# Patient Record
Sex: Male | Born: 1955
Health system: Southern US, Community
[De-identification: ages and names within clinical notes are randomized; demographics above are authoritative.]

## PROBLEM LIST (undated history)

## (undated) DIAGNOSIS — M5126 Other intervertebral disc displacement, lumbar region: Secondary | ICD-10-CM

## (undated) DIAGNOSIS — G4730 Sleep apnea, unspecified: Secondary | ICD-10-CM

## (undated) DIAGNOSIS — M1990 Unspecified osteoarthritis, unspecified site: Secondary | ICD-10-CM

## (undated) DIAGNOSIS — I10 Essential (primary) hypertension: Secondary | ICD-10-CM

## (undated) DIAGNOSIS — N201 Calculus of ureter: Secondary | ICD-10-CM

## (undated) DIAGNOSIS — N200 Calculus of kidney: Secondary | ICD-10-CM

## (undated) DIAGNOSIS — E663 Overweight: Secondary | ICD-10-CM

## (undated) DIAGNOSIS — L570 Actinic keratosis: Secondary | ICD-10-CM

## (undated) DIAGNOSIS — I4819 Other persistent atrial fibrillation: Secondary | ICD-10-CM

## (undated) DIAGNOSIS — C439 Malignant melanoma of skin, unspecified: Secondary | ICD-10-CM

## (undated) DIAGNOSIS — R0683 Snoring: Secondary | ICD-10-CM

## (undated) DIAGNOSIS — G43909 Migraine, unspecified, not intractable, without status migrainosus: Secondary | ICD-10-CM

## (undated) DIAGNOSIS — K922 Gastrointestinal hemorrhage, unspecified: Secondary | ICD-10-CM

## (undated) DIAGNOSIS — K219 Gastro-esophageal reflux disease without esophagitis: Secondary | ICD-10-CM

## (undated) DIAGNOSIS — J309 Allergic rhinitis, unspecified: Secondary | ICD-10-CM

## (undated) DIAGNOSIS — R030 Elevated blood-pressure reading, without diagnosis of hypertension: Secondary | ICD-10-CM

## (undated) DIAGNOSIS — K222 Esophageal obstruction: Secondary | ICD-10-CM

## (undated) HISTORY — DX: Other intervertebral disc displacement, lumbar region: M51.26

## (undated) HISTORY — DX: Calculus of ureter: N20.1

## (undated) HISTORY — DX: Unspecified osteoarthritis, unspecified site: M19.90

## (undated) HISTORY — DX: Sleep apnea, unspecified: G47.30

## (undated) HISTORY — PX: WISDOM TOOTH EXTRACTION: SHX21

## (undated) HISTORY — PX: COLONOSCOPY, SCREENING: SHX00150

## (undated) HISTORY — DX: Gastro-esophageal reflux disease without esophagitis: K21.9

## (undated) HISTORY — PX: COLONOSCOPY: SHX174

## (undated) HISTORY — DX: Other persistent atrial fibrillation: I48.19

## (undated) HISTORY — DX: Overweight: E66.3

## (undated) HISTORY — DX: Snoring: R06.83

## (undated) HISTORY — DX: Elevated blood-pressure reading, without diagnosis of hypertension: R03.0

## (undated) HISTORY — PX: PROSTATE BIOPSY: SHX241

## (undated) HISTORY — DX: Actinic keratosis: L57.0

## (undated) HISTORY — DX: Malignant melanoma of skin, unspecified: C43.9

## (undated) HISTORY — DX: Esophageal obstruction: K22.2

## (undated) HISTORY — PX: UPPER GI ENDOSCOPY: SHX6162

## (undated) HISTORY — DX: Allergic rhinitis, unspecified: J30.9

## (undated) HISTORY — DX: Migraine, unspecified, not intractable, without status migrainosus: G43.909

## (undated) HISTORY — DX: Gastrointestinal hemorrhage, unspecified: K92.2

## (undated) HISTORY — PX: OTHER SURGICAL HISTORY: SHX169

## (undated) SURGERY — CARDIOVERSION
Anesthesia: Monitor Anesthesia Care

---

## 2000-01-22 ENCOUNTER — Encounter: Payer: Self-pay | Admitting: Emergency Medicine

## 2000-01-22 ENCOUNTER — Inpatient Hospital Stay (HOSPITAL_COMMUNITY): Admission: EM | Admit: 2000-01-22 | Discharge: 2000-01-23 | Payer: Self-pay | Admitting: Emergency Medicine

## 2000-01-23 ENCOUNTER — Encounter: Payer: Self-pay | Admitting: Cardiovascular Disease

## 2004-07-07 ENCOUNTER — Ambulatory Visit: Payer: Self-pay | Admitting: Internal Medicine

## 2005-02-01 ENCOUNTER — Ambulatory Visit: Payer: Self-pay | Admitting: Internal Medicine

## 2005-02-15 ENCOUNTER — Ambulatory Visit: Payer: Self-pay | Admitting: Internal Medicine

## 2005-08-12 ENCOUNTER — Emergency Department (HOSPITAL_COMMUNITY): Admission: EM | Admit: 2005-08-12 | Discharge: 2005-08-12 | Payer: Self-pay | Admitting: Emergency Medicine

## 2006-04-17 ENCOUNTER — Ambulatory Visit: Payer: Self-pay | Admitting: Internal Medicine

## 2006-07-01 ENCOUNTER — Ambulatory Visit: Payer: Self-pay | Admitting: Internal Medicine

## 2006-11-20 ENCOUNTER — Ambulatory Visit: Payer: Self-pay | Admitting: Internal Medicine

## 2007-05-11 DIAGNOSIS — J309 Allergic rhinitis, unspecified: Secondary | ICD-10-CM | POA: Insufficient documentation

## 2007-05-11 DIAGNOSIS — K219 Gastro-esophageal reflux disease without esophagitis: Secondary | ICD-10-CM | POA: Insufficient documentation

## 2007-05-11 DIAGNOSIS — G43909 Migraine, unspecified, not intractable, without status migrainosus: Secondary | ICD-10-CM | POA: Insufficient documentation

## 2007-05-11 HISTORY — DX: Allergic rhinitis, unspecified: J30.9

## 2007-05-11 HISTORY — DX: Gastro-esophageal reflux disease without esophagitis: K21.9

## 2007-05-11 HISTORY — DX: Migraine, unspecified, not intractable, without status migrainosus: G43.909

## 2007-07-11 ENCOUNTER — Ambulatory Visit: Payer: Self-pay | Admitting: Internal Medicine

## 2007-11-12 ENCOUNTER — Ambulatory Visit: Payer: Self-pay | Admitting: Internal Medicine

## 2007-11-12 DIAGNOSIS — R030 Elevated blood-pressure reading, without diagnosis of hypertension: Secondary | ICD-10-CM | POA: Insufficient documentation

## 2007-11-12 HISTORY — DX: Elevated blood-pressure reading, without diagnosis of hypertension: R03.0

## 2007-11-12 LAB — HM COLONOSCOPY

## 2008-03-18 ENCOUNTER — Telehealth: Payer: Self-pay | Admitting: Internal Medicine

## 2008-05-21 ENCOUNTER — Ambulatory Visit: Payer: Self-pay | Admitting: Internal Medicine

## 2008-07-08 ENCOUNTER — Telehealth: Payer: Self-pay | Admitting: Internal Medicine

## 2008-07-26 ENCOUNTER — Ambulatory Visit: Payer: Self-pay | Admitting: Internal Medicine

## 2008-08-04 ENCOUNTER — Emergency Department (HOSPITAL_COMMUNITY): Admission: EM | Admit: 2008-08-04 | Discharge: 2008-08-04 | Payer: Self-pay | Admitting: Emergency Medicine

## 2008-08-04 IMAGING — CR DG ANKLE PORT 2V*L*
2 series · 2 of 2 positions shown · non-contrast
Comparison: [8U] hours the same day.

CLINICAL DATA: 52-year-old male with ankle injury post reduction.

PORTABLE LEFT ANKLE - 2 VIEW

[view not recorded (1 of 2)]
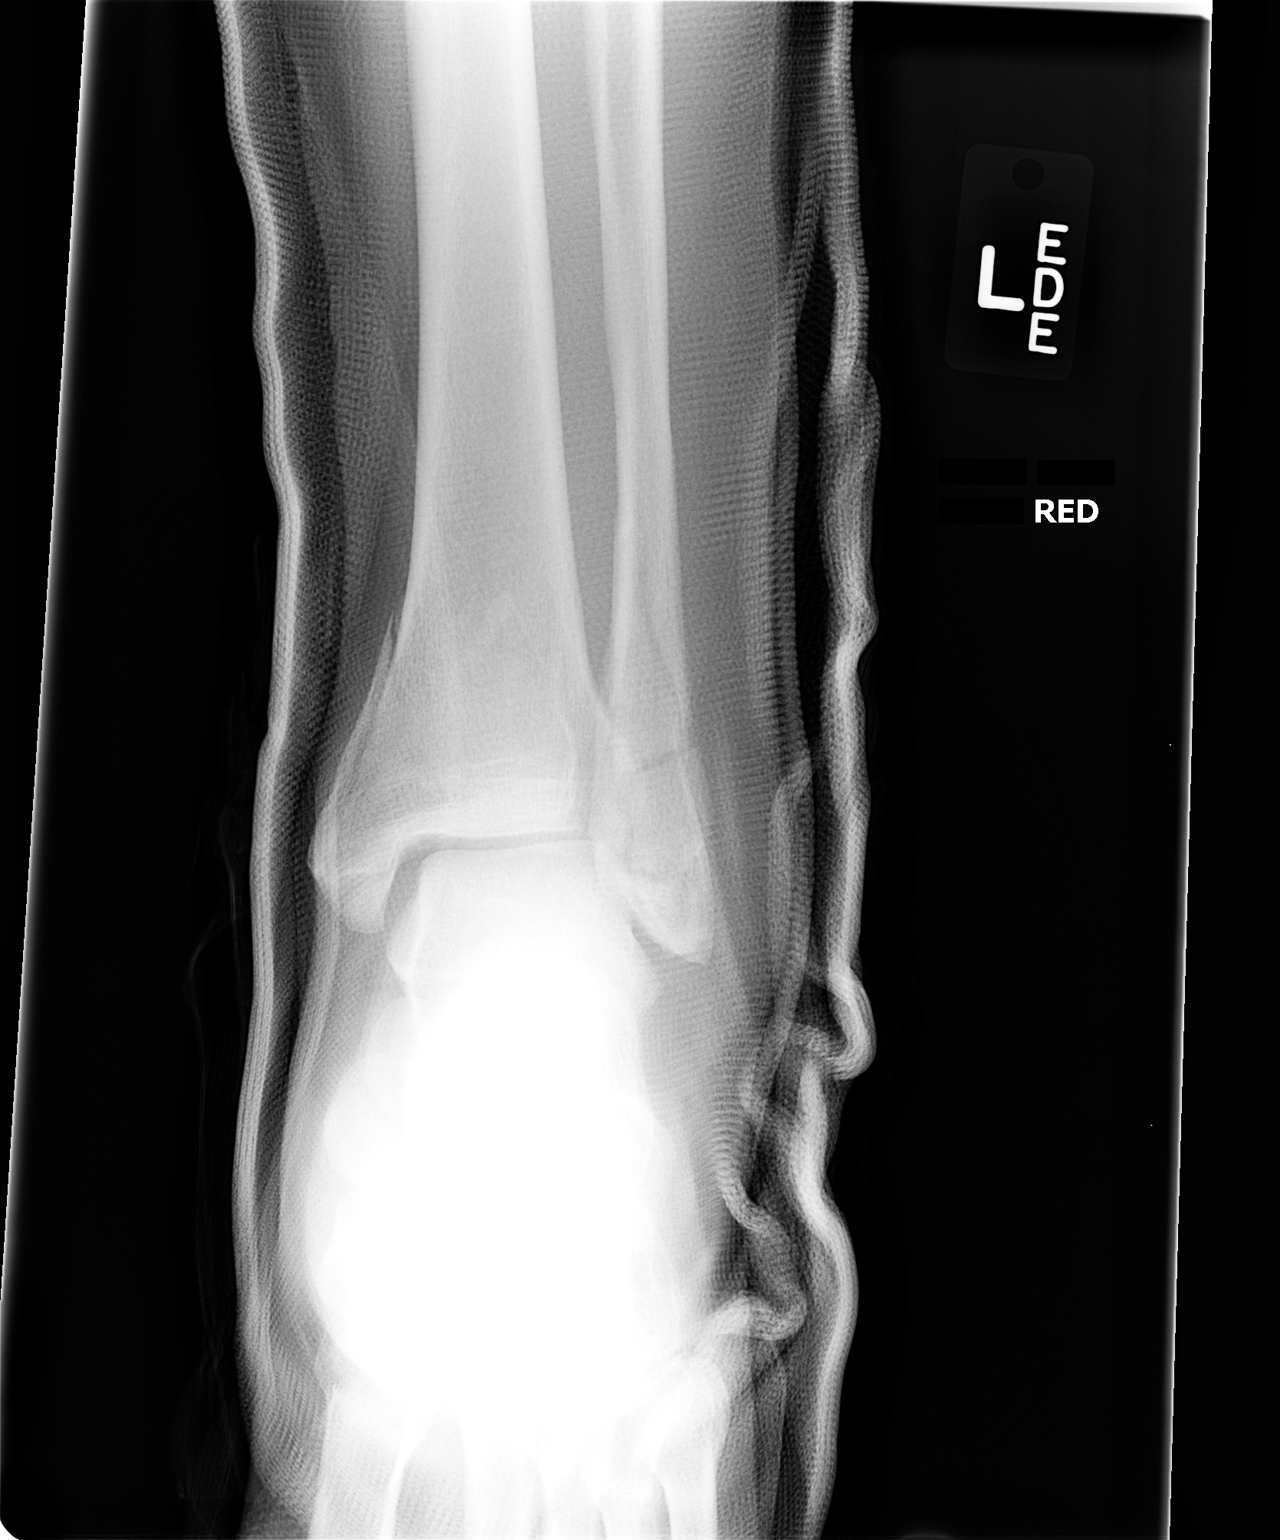

[view not recorded (2 of 2)]
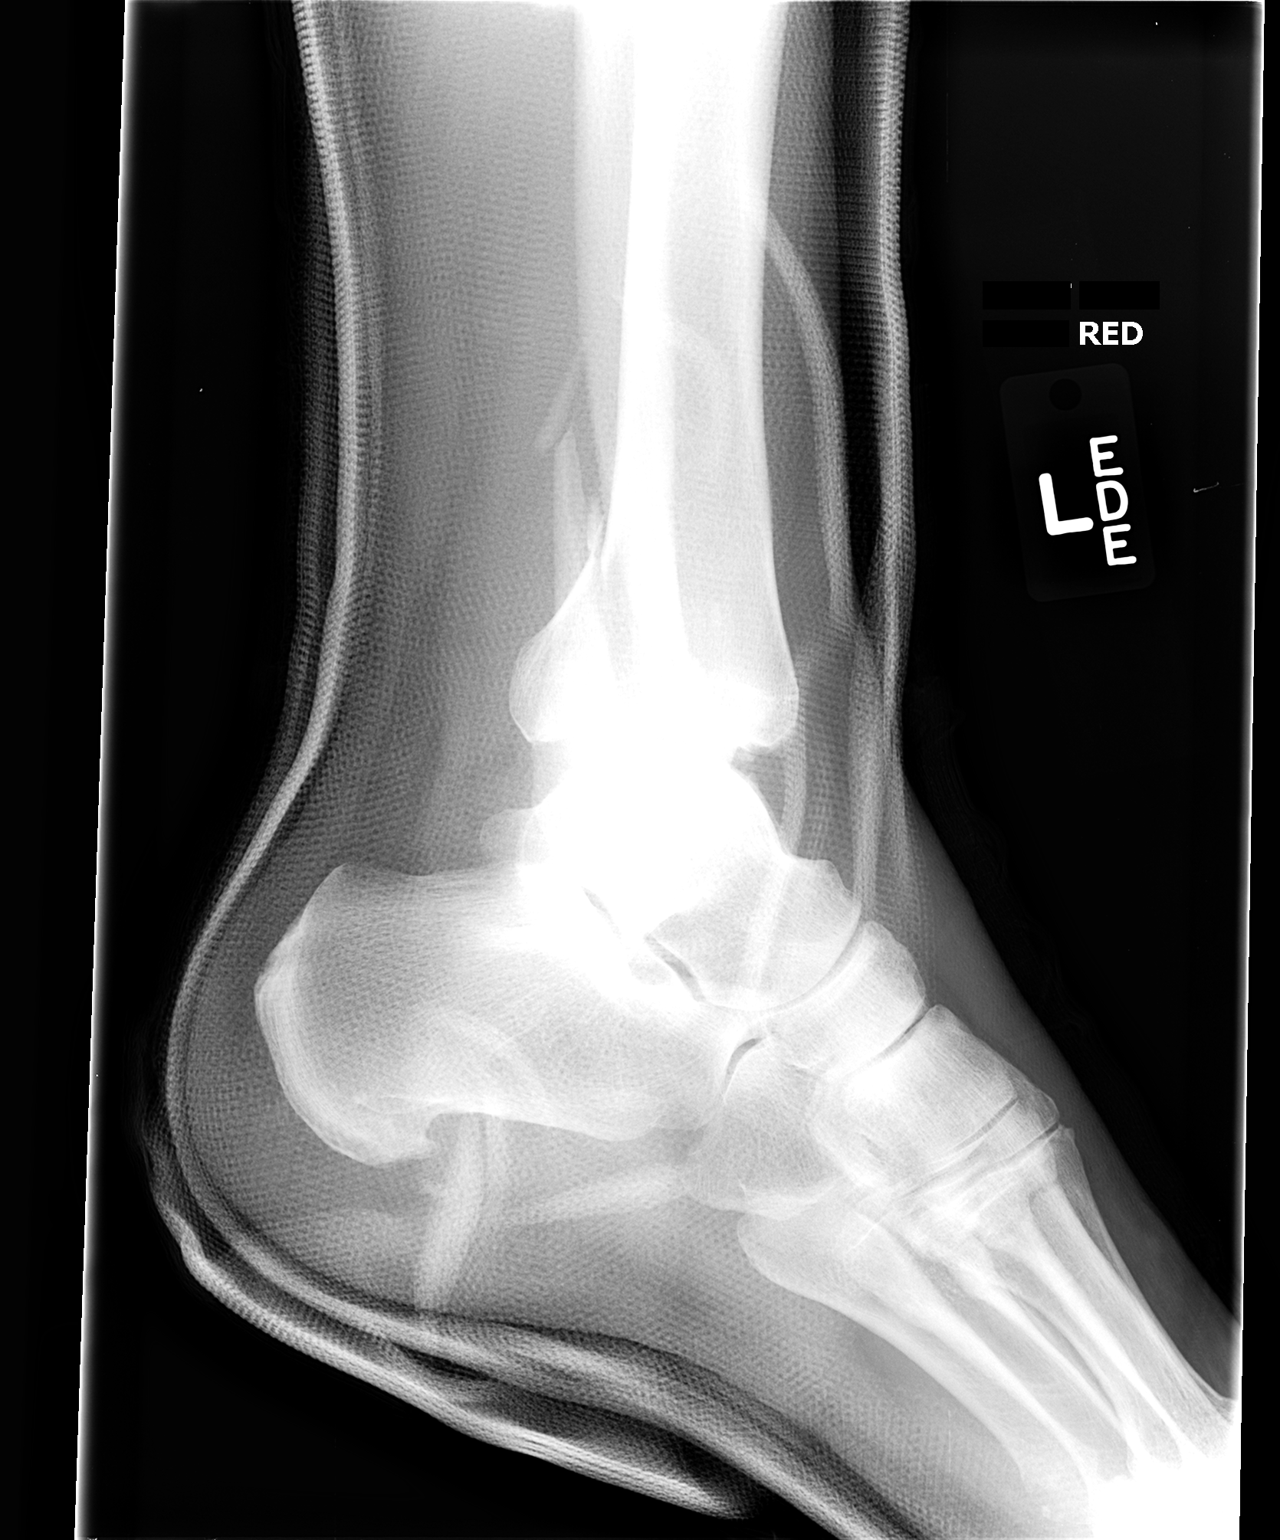

[2 of 2 positions shown; findings below may reference images not displayed]

FINDINGS: Portable AP and lateral views of the left ankle in splint
or casting material.  Stable mortise joint alignment.  Stable
alignment of the comminuted left fibular fracture.  Stable
alignment of the posterior malleolar fracture.  Lucency suspected
to represent the nondisplaced medial malleolus fracture is less
evident given the decreased bony detail.  Talus and calcaneus again
appear intact.
IMPRESSION: Stable configuration of left ankle fractures and mortise joint
alignment.

## 2008-08-06 HISTORY — PX: ANKLE FRACTURE SURGERY: SHX122

## 2008-08-09 ENCOUNTER — Encounter: Admission: RE | Admit: 2008-08-09 | Discharge: 2008-08-09 | Payer: Self-pay | Admitting: Orthopedic Surgery

## 2008-08-23 ENCOUNTER — Telehealth: Payer: Self-pay | Admitting: Internal Medicine

## 2008-09-02 ENCOUNTER — Encounter: Payer: Self-pay | Admitting: Internal Medicine

## 2008-09-30 ENCOUNTER — Encounter: Payer: Self-pay | Admitting: Internal Medicine

## 2008-10-28 ENCOUNTER — Encounter: Payer: Self-pay | Admitting: Internal Medicine

## 2009-04-19 ENCOUNTER — Ambulatory Visit: Payer: Self-pay | Admitting: Family Medicine

## 2009-04-22 ENCOUNTER — Telehealth (INDEPENDENT_AMBULATORY_CARE_PROVIDER_SITE_OTHER): Payer: Self-pay | Admitting: *Deleted

## 2009-04-27 ENCOUNTER — Ambulatory Visit: Payer: Self-pay | Admitting: Family Medicine

## 2009-10-24 ENCOUNTER — Ambulatory Visit: Payer: Self-pay | Admitting: Internal Medicine

## 2009-10-24 DIAGNOSIS — L570 Actinic keratosis: Secondary | ICD-10-CM

## 2009-10-24 DIAGNOSIS — B356 Tinea cruris: Secondary | ICD-10-CM | POA: Insufficient documentation

## 2009-10-24 HISTORY — DX: Actinic keratosis: L57.0

## 2010-05-10 ENCOUNTER — Ambulatory Visit: Payer: Self-pay | Admitting: Internal Medicine

## 2010-05-10 DIAGNOSIS — J019 Acute sinusitis, unspecified: Secondary | ICD-10-CM | POA: Insufficient documentation

## 2010-06-22 ENCOUNTER — Ambulatory Visit: Payer: Self-pay | Admitting: Family Medicine

## 2010-09-05 NOTE — Progress Notes (Signed)
Summary: needs to be worked in or have rx called in   Phone Note Call from Patient Call back at [phone]   Caller: pt wife [name] live Call For: [facility] Summary of Call: low grade fever, sore throat, achey feels lousy.  no congestion.  [facility].  Would like to see [facility] tomorrow or someone else or have rx called in  Initial call taken by: Roselle Locus,  July 08, 2008 4:06 PM  Follow-up for Phone Call        worked in for sore throat that feels like swallowing razor blades. Follow-up by: Rudy Jew, RN,  July 08, 2008 4:35 PM

## 2010-09-05 NOTE — Assessment & Plan Note (Signed)
Summary: sinus infection no better/dm   Vital Signs:  Patient profile:   55 year old male Weight:      228 pounds Temp:     98.8 degrees F oral BP sitting:   132 / 74  (left arm) Cuff size:   large  Vitals Entered By: Alfred Levins, CMA (April 27, 2009 11:34 AM) CC: not feeling any better   History of Present Illness: Here with continuing symptoms of a sinus infection. Has sinus pressure, HA, PND, and blowing green mucuc from the nose. Some fevers. Coughing is dry and mild. Has taken a course of Doxycycline and a course of Zithromax. Each time he felt better briefly and then the symptoms returned.   Allergies (verified): No Known Drug Allergies  Past History:  Past Medical History: Reviewed history from 05/11/2007 and no changes required. Allergic rhinitis GERD migraine headache  Review of Systems  The patient denies anorexia, weight loss, weight gain, vision loss, decreased hearing, hoarseness, chest pain, syncope, dyspnea on exertion, peripheral edema, prolonged cough, hemoptysis, abdominal pain, melena, hematochezia, severe indigestion/heartburn, hematuria, incontinence, genital sores, muscle weakness, suspicious skin lesions, transient blindness, difficulty walking, depression, unusual weight change, abnormal bleeding, enlarged lymph nodes, angioedema, breast masses, and testicular masses.    Physical Exam  General:  Well-developed,well-nourished,in no acute distress; alert,appropriate and cooperative throughout examination Head:  Normocephalic and atraumatic without obvious abnormalities. No apparent alopecia or balding. Eyes:  No corneal or conjunctival inflammation noted. EOMI. Perrla. Funduscopic exam benign, without hemorrhages, exudates or papilledema. Vision grossly normal. Ears:  External ear exam shows no significant lesions or deformities.  Otoscopic examination reveals clear canals, tympanic membranes are intact bilaterally without bulging, retraction,  inflammation or discharge. Hearing is grossly normal bilaterally. Nose:  External nasal examination shows no deformity or inflammation. Nasal mucosa are pink and moist without lesions or exudates. Mouth:  Oral mucosa and oropharynx without lesions or exudates.  Teeth in good repair. Neck:  No deformities, masses, or tenderness noted. Lungs:  Normal respiratory effort, chest expands symmetrically. Lungs are clear to auscultation, no crackles or wheezes.   Impression & Recommendations:  Problem # 1:  ACUTE SINUSITIS, UNSPECIFIED (ICD-461.9)  His updated medication list for this problem includes:    Levaquin 500 Mg Tabs (Levofloxacin) ..... Once daily  Complete Medication List: 1)  Maxalt-mlt 10 Mg Tbdp (Rizatriptan benzoate) .... As needed 2)  Protonix 40 Mg Tbec (Pantoprazole sodium) .... Take 1 tablet by mouth once a day 3)  Claritin 10 Mg Tabs (Loratadine) .... Take 1 tablet by mouth once a day 4)  Levaquin 500 Mg Tabs (Levofloxacin) .... Once daily  Patient Instructions: 1)  Please schedule a follow-up appointment as needed .  Prescriptions: LEVAQUIN 500 MG TABS (LEVOFLOXACIN) once daily  #10 x 0   Entered and Authorized by:   Nelwyn Salisbury MD   Signed by:   Nelwyn Salisbury MD on 04/27/2009   Method used:   Electronically to        [facility]       [address]       [place], [place]  [address]       Ph: [phone]       Fax: [phone]   RxID:   [id number]

## 2010-09-05 NOTE — Assessment & Plan Note (Signed)
Summary: flu shot/njr  Nurse Visit   Impression & Recommendations: Flu Vaccine Consent Questions     Do you have a history of severe allergic reactions to this vaccine? no    Any prior history of allergic reactions to egg and/or gelatin? no    Do you have a sensitivity to the preservative Thimersol? no    Do you have a past history of Guillan-Barre Syndrome? no    Do you currently have an acute febrile illness? no    Have you ever had a severe reaction to latex? no    Vaccine information given and explained to patient? yes    Are you currently pregnant? no    Lot Number:[id number]   Site Given  Left Deltoid IM   Complete Medication List: 1)  Maxalt-mlt 10 Mg Tbdp (Rizatriptan benzoate) .... As needed 2)  Protonix 40 Mg Tbec (Pantoprazole sodium) .... Take 1 tablet by mouth once a day 3)  Claritin 10 Mg Tabs (Loratadine) .... Take 1 tablet by mouth once a day 4)  Bromfed 12-15 Mg Xr12h-cap (Brompheniramine-phenylephrine) .... One by mouth bid 5)  Zithromax Z-pak 250 Mg Tabs (Azithromycin) .... As directed    Prior Medications: MAXALT-MLT 10 MG TBDP (RIZATRIPTAN BENZOATE) as needed PROTONIX 40 MG TBEC (PANTOPRAZOLE SODIUM) Take 1 tablet by mouth once a day CLARITIN 10 MG  TABS (LORATADINE) Take 1 tablet by mouth once a day BROMFED 12-15 MG  XR12H-CAP (BROMPHENIRAMINE-PHENYLEPHRINE) one by mouth bid ZITHROMAX Z-PAK 250 MG  TABS (AZITHROMYCIN) as directed Current Allergies: No known allergies     Orders Added: 1)  Admin 1st Vaccine [90471] 2)  Flu Vaccine 66yrs + Baden.Dew    ]

## 2010-09-05 NOTE — Assessment & Plan Note (Signed)
Summary: coughing,sinus blowing yellow green,headache/jls   Vital Signs:  Patient Profile:   55 Years Old Male Temp:     98.1 degrees F Pulse rate:   92 / minute BP sitting:   120 / 84  (left arm)  Vitals Entered By: Gladis Riffle, RN (July 26, 2008 11:46 AM)                 Chief Complaint:  c/o head cold last week, was clearing but now has pressure in sunuses, headache, yellow discharge--thinks is sinus infection as is prone to them, some cough, and URI symptoms.  History of Present Illness:  URI Symptoms      This is a 55 year old man who presents with URI symptoms.  The symptoms began 10 days ago.  The severity is described as moderate.  The patient reports nasal congestion, sore throat, and dry cough, but denies clear nasal discharge, purulent nasal discharge, productive cough, earache, and sick contacts.  The patient denies fever, stiff neck, and dyspnea.  The patient denies itchy watery eyes, itchy throat, sneezing, seasonal symptoms, response to antihistamine, headache, muscle aches, and severe fatigue.  Risk factors for Strep sinusitis include unilateral facial pain.  The patient denies the following risk factors for Strep sinusitis: unilateral nasal discharge, poor response to decongestant, double sickening, tooth pain, Strep exposure, and tender adenopathy.    Past Medical History: Allergic rhinitis GERD migraine headache Past Surgical History:  Family History:  Social History: Married Occupation: atty      Updated Prior Medication List: MAXALT-MLT 10 MG TBDP (RIZATRIPTAN BENZOATE) as needed PROTONIX 40 MG TBEC (PANTOPRAZOLE SODIUM) Take 1 tablet by mouth once a day CLARITIN 10 MG  TABS (LORATADINE) Take 1 tablet by mouth once a day  Current Allergies (reviewed today): No known allergies       Physical Exam  General:     Well-developed,well-nourished,in no acute distress; alert,appropriate and cooperative throughout examination Head:     normocephalic  and atraumatic.   Eyes:     pupils equal and pupils round.   Ears:     R ear normal and L ear normal.   Neck:     No deformities, masses, or tenderness noted. Lungs:     Normal respiratory effort, chest expands symmetrically. Lungs are clear to auscultation, no crackles or wheezes. Heart:     Normal rate and regular rhythm. S1 and S2 normal without gallop, murmur, click, rub or other extra sounds.    Impression & Recommendations:  Problem # 1:  URI (ICD-465.9) discussed given duration will treat with antibiotic SE discussed The following medications were removed from the medication list:    Bromfed 12-15 Mg Xr12h-cap (Brompheniramine-phenylephrine) ..... One by mouth bid  His updated medication list for this problem includes:    Claritin 10 Mg Tabs (Loratadine) .[facility]... Take 1 tablet by mouth once a day   Complete Medication List: 1)  Maxalt-mlt 10 Mg Tbdp (Rizatriptan benzoate) .... As needed 2)  Protonix 40 Mg Tbec (Pantoprazole sodium) .... Take 1 tablet by mouth once a day 3)  Claritin 10 Mg Tabs (Loratadine) .... Take 1 tablet by mouth once a day    Prescriptions: PROTONIX 40 MG TBEC (PANTOPRAZOLE SODIUM) Take 1 tablet by mouth once a day  #30 Tablet x 11   Entered by:   Gladis Riffle, RN   Authorized by:   Birdie Sons MD   Signed by:   Gladis Riffle, RN on 07/29/2008   Method used:  Electronically to        [facility] ([facility])       [address]       [place], [place]  [address]       Ph: [phone]       Fax: [phone]   RxID:   [id number] DOXYCYCLINE HYCLATE 100 MG CAPS (DOXYCYCLINE HYCLATE) Take 1 tab twice a day  #20 x 0   Entered and Authorized by:   Birdie Sons MD   Signed by:   Birdie Sons MD on 07/26/2008   Method used:   Electronically to        [facility] ([facility])       [address]       [place], [place]  [address]       Ph: [phone]       Fax: [phone]    RxID:   [id number]  ]

## 2010-09-05 NOTE — Letter (Signed)
Summary: [facility]  [facility]   Imported By: Maryln Gottron 09/13/2008 15:16:51  _____________________________________________________________________  External Attachment:    Type:   Image     Comment:   External Document

## 2010-09-05 NOTE — Assessment & Plan Note (Signed)
Summary: FLU SHOT W/ELLEN/CCM  Nurse Visit    Prior Medications: DICLOFENAC SODIUM 75 MG TBEC (DICLOFENAC SODIUM) Take 1 tablet by mouth twice a day MAXALT-MLT 10 MG TBDP (RIZATRIPTAN BENZOATE) as needed PROTONIX 40 MG TBEC (PANTOPRAZOLE SODIUM) Take 1 tablet by mouth once a day VICODIN 5-500 MG TABS (HYDROCODONE-ACETAMINOPHEN) Take 1 tablet by mouth every four hours CLARITIN 10 MG  TABS (LORATADINE) Take 1 tablet by mouth once a day Current Allergies: No known allergies    Influenza Vaccine    Vaccine Type: Fluvax Non-MCR    Given by: Gladis Riffle, RN  Flu Vaccine Consent Questions    Do you have a history of severe allergic reactions to this vaccine? no    Any prior history of allergic reactions to egg and/or gelatin? no    Do you have a sensitivity to the preservative Thimersol? no    Do you have a past history of Guillan-Barre Syndrome? no    Do you currently have an acute febrile illness? no    Have you ever had a severe reaction to latex? no    Vaccine information given and explained to patient? yes lot [id number], exp June 30,2009, [employer], left deltoid IM, 0.5 cc ..................................................................Marland KitchenGladis Riffle, RN  July 11, 2007 2:28 PM   Orders Added: 1)  Influenza Vaccine NON MCR [00028]    ]

## 2010-09-05 NOTE — Progress Notes (Signed)
Summary: sinus infection  Phone Note Call from Patient   Caller: Patient Call For: Dr Cato Mulligan Summary of Call: Pt complaining of low grade fever, headache, sinus drainage (yellow), cough.  Would like RX called in to [facility]) [phone] Initial call taken by: Lynann Beaver CMA,  March 18, 2008 9:58 AM  Follow-up for Phone Call        zpack and bromfed BID Follow-up by: Stacie Glaze MD,  March 18, 2008 12:41 PM    New/Updated Medications: BROMFED 12-15 MG  XR12H-CAP (BROMPHENIRAMINE-PHENYLEPHRINE) one by mouth bid ZITHROMAX Z-PAK 250 MG  TABS (AZITHROMYCIN) as directed   Prescriptions: ZITHROMAX Z-PAK 250 MG  TABS (AZITHROMYCIN) as directed  #6 x 0   Entered by:   Lynann Beaver CMA   Authorized by:   Stacie Glaze MD   Signed by:   Lynann Beaver CMA on 03/18/2008   Method used:   Electronically sent to ...       [facility]       [address]       [place], [place]  [address]       Ph: [phone]       Fax: [phone]   RxID:   [id number] BROMFED 12-15 MG  XR12H-CAP (BROMPHENIRAMINE-PHENYLEPHRINE) one by mouth bid  #30 x 0   Entered by:   Lynann Beaver CMA   Authorized by:   Stacie Glaze MD   Signed by:   Lynann Beaver CMA on 03/18/2008   Method used:   Electronically sent to ...       [facility]       [address]       [place], [place]  [address]       Ph: [phone]       Fax: [phone]   RxID:   [id number]  Pt. notified.

## 2010-09-05 NOTE — Assessment & Plan Note (Signed)
Summary: COUGH, CONGESTION, FEVER // RS   Vital Signs:  Patient profile:   55 year old male Weight:      229 pounds O2 Sat:      96 % on Room air Temp:     98.2 degrees F oral Pulse rate:   98 / minute Pulse rhythm:   regular Resp:     12 per minute BP sitting:   118 / 82  (left arm) Cuff size:   regular  Vitals Entered By: Gladis Riffle, RN (June 22, 2010 1:32 PM)  O2 Flow:  Room air  History of Present Illness: 55 y/o WM with 8d of respiratory illness.   The patient complains of nasal congestion, clear nasal discharge, dry cough, and sick contacts.  Associated symptoms include fever/chills for the first 5d of the illness.  The patient denies dyspnea and wheezing but has frequent post-exhalation coughing.  The patient also reports headache and muscle aches.  Most of his symptoms have improved except his cough.   Problems Prior to Update: 1)  Sinusitis- Acute-nos  (ICD-461.9) 2)  Tinea Cruris  (ICD-110.3) 3)  Actinic Keratosis  (ICD-702.0) 4)  Elevated Blood Pressure Without Diagnosis of Hypertension  (ICD-796.2) 5)  Migraine Headache  (ICD-346.90) 6)  Gerd  (ICD-530.81) 7)  Allergic Rhinitis  (ICD-477.9)  Medications Prior to Update: 1)  Maxalt-Mlt 10 Mg Tbdp (Rizatriptan Benzoate) .... As Needed 2)  Protonix 40 Mg Tbec (Pantoprazole Sodium) .... Take 1 Tablet By Mouth Once A Day 3)  Nystatin 100000 Unit/gm Powd (Nystatin) .... Use As Needed To Prevent "jock Itch" 4)  Nystatin-Triamcinolone 100000-0.1 Unit/gm-% Crea (Nystatin-Triamcinolone) .... Apply Two Times A Day As Needed To Treat "jock Itch" 5)  Doxycycline Hyclate 100 Mg Caps (Doxycycline Hyclate) .... One Twice Daily  Allergies (verified): No Known Drug Allergies  Past History:  Past medical history reviewed for relevance to current acute and chronic problems.  Past Medical History: Reviewed history from 05/11/2007 and no changes required. Allergic rhinitis GERD migraine headache  Review of Systems    see HPI.  Physical Exam  General:  Gen: alert, NAD, NONTOXIC. HEENT: eyes without injection, drainage, or swelling.  Ears: EACs clear, TMs with normal light reflex and landmarks.  Nose: some dried, crusty exudate adherent to some mildly injected mucosa.  No purulent d/c.  No paranasal sinus TTP.  No facial swelling.  Throat and mouth without focal lesion.  No pharyngial swelling, erythema, or exudate.   Neck: supple, no LAD.  LUNGS: CTA bilaterally except some high pitched wheezing with forceful expiration--post-exhalation coughing noted, nonlabored resps.  CV: RRR, no m/r/g.     Impression & Recommendations:  Problem # 1:  BRONCHITIS- ACUTE (ICD-466.0) Acute bronchitis/mild RAD.  Fever has resolved.  URI symptoms improving. Rx'd z-pack and ventolin HFA. Return at earliest convenience for flu vaccine.  Complete Medication List: 1)  Maxalt-mlt 10 Mg Tbdp (Rizatriptan benzoate) .... As needed 2)  Protonix 40 Mg Tbec (Pantoprazole sodium) .... Take 1 tablet by mouth once a day 3)  Nystatin 100000 Unit/gm Powd (Nystatin) .... Use as needed to prevent "jock itch" 4)  Nystatin-triamcinolone 100000-0.1 Unit/gm-% Crea (Nystatin-triamcinolone) .... Apply two times a day as needed to treat "jock itch" 5)  Zithromax Z-pak 250 Mg Tabs (Azithromycin) .... As directed 6)  Proair Hfa 108 (90 Base) Mcg/act Aers (Albuterol sulfate) .[facility].. 1-2 puffs q4h as needed cough, wheeze, chest tightness  Patient Instructions: 1)  Please schedule a follow-up appointment as needed .  Prescriptions: PROAIR  HFA 108 (90 BASE) MCG/ACT AERS (ALBUTEROL SULFATE) 1-2 puffs q4h as needed cough, wheeze, chest tightness  #1 x 0   Entered and Authorized by:   Michell Heinrich M.D.   Signed by:   Michell Heinrich M.D. on 06/22/2010   Method used:   Electronically to        [facility]       [address]       [place], [place]  [address]       Ph: [phone]        Fax: [phone]   RxID:   [id number] ZITHROMAX Z-PAK 250 MG TABS (AZITHROMYCIN) as directed  #1 pack x 0   Entered and Authorized by:   Michell Heinrich M.D.   Signed by:   Michell Heinrich M.D. on 06/22/2010   Method used:   Electronically to        [facility])       [address]       [place], [place]  [address]       Ph: [phone]       Fax: [phone]   RxID:   [id number]    Orders Added: 1)  Est. Patient Level III [[address]]

## 2010-09-05 NOTE — Assessment & Plan Note (Signed)
Summary: ?MOLE REMOVAL/NJR   Vital Signs:  Patient profile:   55 year old male Height:      64 inches Weight:      233 pounds BMI:     40.14 Temp:     97.8 degrees F oral BP sitting:   146 / 96  (left arm) Cuff size:   regular  Vitals Entered By: Kern Reap CMA Duncan Dull) (October 24, 2009 8:08 AM) CC: mole on forehead and middle of back, jock itch   CC:  mole on forehead and middle of back and jock itch.  History of Present Illness:  recurring "jock itch"---not improving on OTC meds, uncomfortable to touch (ongoing for 3 years)  2 moles (one on forehead , several on back)  All other systems reviewed and were negative   Allergies (verified): No Known Drug Allergies  Past History:  Past Surgical History: ankle surgery x 2 fracture  Physical Exam  General:  Well-developed,well-nourished,in no acute distress; alert,appropriate and cooperative throughout examination Head:  normocephalic and atraumatic.   Skin:  actinic keratosis  well demarcated rash---groin   Impression & Recommendations:  Problem # 1:  ACTINIC KERATOSIS (ICD-702.0) moles of no concern  Problem # 2:  TINEA CRURIS (ICD-110.3) tinea see meds use powder weekly to prevent exacerbation  Problem # 3:  ELEVATED BLOOD PRESSURE WITHOUT DIAGNOSIS OF HYPERTENSION (ICD-796.2) he will monitor  Complete Medication List: 1)  Maxalt-mlt 10 Mg Tbdp (Rizatriptan benzoate) .... As needed 2)  Protonix 40 Mg Tbec (Pantoprazole sodium) .... Take 1 tablet by mouth once a day--needs office visit for additionalrefills 3)  Nystatin 100000 Unit/gm Powd (Nystatin) .... Use as needed to prevent "jock itch" 4)  Nystatin-triamcinolone 100000-0.1 Unit/gm-% Crea (Nystatin-triamcinolone) .... Apply two times a day as needed to treat "jock itch" Prescriptions: NYSTATIN-TRIAMCINOLONE 100000-0.1 UNIT/GM-% CREA (NYSTATIN-TRIAMCINOLONE) apply two times a day as needed to treat "jock itch"  #30 grams x 1   Entered and Authorized  by:   Birdie Sons MD   Signed by:   Birdie Sons MD on 10/24/2009   Method used:   Electronically to        [facility]       [address]       [place], [place]  [address]       Ph: [phone]       Fax: [phone]   RxID:   [id number] NYSTATIN 100000 UNIT/GM POWD (NYSTATIN) use as needed to prevent "jock itch"  #1 container x 1   Entered and Authorized by:   Birdie Sons MD   Signed by:   Birdie Sons MD on 10/24/2009   Method used:   Electronically to        [facility]       [address]       [place], [place]  [address]       Ph: [phone]       Fax: [phone]   RxID:   [id number]

## 2010-09-05 NOTE — Progress Notes (Signed)
Summary: Post op cough  Phone Note Call from Patient   Caller: Spouse Call For: Dr Cato Mulligan Summary of Call: Pt fractured his ankle last week in 12 places and had extensive surgery.  Is at home recovering, but developed a deep cough.  They wilil let the surgeon know, and call us back if he needs to be seen.   Initial call taken by: Lynann Beaver CMA,  August 23, 2008 8:18 AM  Follow-up for Phone Call        August 17, 2008........started with cough Thursday night, no fever, non productive.  Not feeling ill. Taking Percocet right now for pain. Do you think he needs an antibiotic since he is post-op? [phone] [facility]) Surgeon prefers we treat it and pt is non weight bearing with elevation above nose.......a little difficult to make an office visit. Follow-up by: Lynann Beaver CMA,  August 23, 2008 10:54 AM  Additional Follow-up for Phone Call Additional follow up Details #1::        call in phenergan with codeine 1-2 tsp three times a day as needed cough 240 cc call if fever or SOB Additional Follow-up by: Birdie Sons MD,  August 23, 2008 2:52 PM    Additional Follow-up for Phone Call Additional follow up Details #2::    Phone number dialed gives fax sound. Prescription sent to pharmacy. Follow-up by: Lynann Beaver CMA,  August 23, 2008 3:24 PM  Additional Follow-up for Phone Call Additional follow up Details #3:: Details for Additional Follow-up Action Taken: Pt. notified. Additional Follow-up by: Lynann Beaver CMA,  August 23, 2008 4:34 PM  New/Updated Medications: PROMETHAZINE-CODEINE 6.25-10 MG/5ML SYRP (PROMETHAZINE-CODEINE) 1-2 teaspoons as needed for cough.   Prescriptions: PROMETHAZINE-CODEINE 6.25-10 MG/5ML SYRP (PROMETHAZINE-CODEINE) 1-2 teaspoons as needed for cough.  #240 ml. x 0   Entered by:   Lynann Beaver CMA   Authorized by:   Birdie Sons MD   Signed by:   Lynann Beaver CMA on 08/23/2008   Method used:   Telephoned to ...       [facility]       [address]       [place], [place]  [address]       Ph: [phone]       Fax: [phone]   RxID:   [id number]

## 2010-09-05 NOTE — Assessment & Plan Note (Signed)
Summary: SINUS INFECTION//SLM   Vital Signs:  Patient profile:   54 year old male Weight:      230 pounds Temp:     98.2 degrees F oral BP sitting:   120 / 80  (left arm) Cuff size:   regular  Vitals Entered By: Duard Brady LPN (May 10, 2010 10:32 AM) CC: c/o head congestion and cough ?fever Is Patient Diabetic? No   CC:  c/o head congestion and cough ?fever.  History of Present Illness: 55 year old patient who is in today for follow-up.  Approximately, 10 days ago he developed a URI and initially seemed to be improving.  Three days ago, he developed increasing fever, sinus congestion, yellow to green sinus drainage.  He has been control his fever with Tylenol.  He states that he is prone to sinus infections.  This time of year.  He does have a history of allergic rhinitis.  Allergies (verified): No Known Drug Allergies  Past History:  Past Medical History: Reviewed history from 05/11/2007 and no changes required. Allergic rhinitis GERD migraine headache  Review of Systems       The patient complains of anorexia, fever, and hoarseness.  The patient denies weight loss, weight gain, vision loss, decreased hearing, chest pain, syncope, dyspnea on exertion, peripheral edema, prolonged cough, headaches, hemoptysis, abdominal pain, melena, hematochezia, severe indigestion/heartburn, hematuria, incontinence, genital sores, muscle weakness, suspicious skin lesions, transient blindness, difficulty walking, depression, unusual weight change, abnormal bleeding, enlarged lymph nodes, angioedema, breast masses, and testicular masses.    Physical Exam  General:  Well-developed,well-nourished,in no acute distress; alert,appropriate and cooperative throughout examination Head:  Normocephalic and atraumatic without obvious abnormalities. No apparent alopecia or balding. Eyes:  No corneal or conjunctival inflammation noted. EOMI. Perrla. Funduscopic exam benign, without hemorrhages,  exudates or papilledema. Vision grossly normal. Ears:  External ear exam shows no significant lesions or deformities.  Otoscopic examination reveals clear canals, tympanic membranes are intact bilaterally without bulging, retraction, inflammation or discharge. Hearing is grossly normal bilaterally. Nose:  External nasal examination shows no deformity or inflammation. Nasal mucosa are pink and moist without lesions or exudates. Mouth:  Oral mucosa and oropharynx without lesions or exudates.  Teeth in good repair. Neck:  No deformities, masses, or tenderness noted. Lungs:  Normal respiratory effort, chest expands symmetrically. Lungs are clear to auscultation, no crackles or wheezes.   Impression & Recommendations:  Problem # 1:  SINUSITIS- ACUTE-NOS (ICD-461.9)  His updated medication list for this problem includes:    Doxycycline Hyclate 100 Mg Caps (Doxycycline hyclate) ..... One twice daily  His updated medication list for this problem includes:    Doxycycline Hyclate 100 Mg Caps (Doxycycline hyclate) ..... One twice daily  Complete Medication List: 1)  Maxalt-mlt 10 Mg Tbdp (Rizatriptan benzoate) .... As needed 2)  Protonix 40 Mg Tbec (Pantoprazole sodium) .... Take 1 tablet by mouth once a day 3)  Nystatin 100000 Unit/gm Powd (Nystatin) .... Use as needed to prevent "jock itch" 4)  Nystatin-triamcinolone 100000-0.1 Unit/gm-% Crea (Nystatin-triamcinolone) .... Apply two times a day as needed to treat "jock itch" 5)  Doxycycline Hyclate 100 Mg Caps (Doxycycline hyclate) .... One twice daily  Patient Instructions: 1)  Get plenty of rest, drink lots of clear liquids, and use Tylenol or Ibuprofen for fever and comfort. Return in 7-10 days if you're not better:sooner if you're feeling worse. 2)  Take your antibiotic as prescribed until ALL of it is gone, but stop if you develop a rash or  swelling and contact our office as soon as possible. Prescriptions: DOXYCYCLINE HYCLATE 100 MG CAPS  (DOXYCYCLINE HYCLATE) one twice daily  #14 x 0   Entered and Authorized by:   Gordy Savers  MD   Signed by:   Gordy Savers  MD on 05/10/2010   Method used:   Electronically to        [facility] [address] (retail)       [address]       [place], [place]  [address]       Ph: [phone]       Fax: [phone]   RxID:   [id number] DOXYCYCLINE HYCLATE 100 MG CAPS (DOXYCYCLINE HYCLATE) one twice daily  #14 x 0   Entered and Authorized by:   Gordy Savers  MD   Signed by:   Gordy Savers  MD on 05/10/2010   Method used:   Print then Give to Patient   RxID:   [id number]

## 2010-09-05 NOTE — Letter (Signed)
Summary: [facility]  [facility]   Imported By: Maryln Gottron 11/15/2008 14:59:16  _____________________________________________________________________  External Attachment:    Type:   Image     Comment:   External Document

## 2010-09-05 NOTE — Progress Notes (Signed)
Summary: Needs to change antibiotic  Phone Note Call from Patient   Summary of Call: Patient states his antibiotic that was prescribed is giving him diarrhea. Patient requesting to change antibiotic. Pharm[facility] Patient can be reached at [phone].  Initial call taken by: Darra Lis RMA,  April 22, 2009 9:51 AM  Follow-up for Phone Call        dc doxycycline trial zpack  Follow-up by: Birdie Sons MD,  April 22, 2009 12:02 PM  Additional Follow-up for Phone Call Additional follow up Details #1::        Patient notified. Additional Follow-up by: Darra Lis RMA,  April 22, 2009 1:45 PM    New/Updated Medications: AZITHROMYCIN 250 MG  TABS (AZITHROMYCIN) 2 by  mouth today and then 1 daily for 4 days Prescriptions: AZITHROMYCIN 250 MG  TABS (AZITHROMYCIN) 2 by  mouth today and then 1 daily for 4 days  #6 x 0   Entered and Authorized by:   Birdie Sons MD   Signed by:   Birdie Sons MD on 04/22/2009   Method used:   Electronically to        [facility] (retail)       [address]       [place], [place]  [address]       Ph: [phone]       Fax: [phone]   RxID:   [id number]

## 2010-09-05 NOTE — Assessment & Plan Note (Signed)
Summary: blood pressure high/mhf   Vital Signs:  Patient Profile:   54 Years Old Male Weight:      230 pounds Temp:     98.2 degrees F oral BP sitting:   114 / 84  Vitals Entered By: Lynann Beaver CMA (November 12, 2007 3:45 PM)                 Chief Complaint:  concerned about BP.  History of Present Illness: wrist BP monitor up to 200/100  Current Meds:  MAXALT-MLT 10 MG TBDP (RIZATRIPTAN BENZOATE) as needed PROTONIX 40 MG TBEC (PANTOPRAZOLE SODIUM) Take 1 tablet by mouth once a day CLARITIN 10 MG  TABS (LORATADINE) Take 1 tablet by mouth once a day  Past Medical History: Allergic rhinitis GERD migraine headache     Current Allergies: No known allergies    Social History:    Married    Occupation: atty   Risk Factors:  Colonoscopy History:     Date of Last Colonoscopy:  08/06/2004    Results:  normal       Impression & Recommendations:  Problem # 1:  ELEVATED BLOOD PRESSURE WITHOUT DIAGNOSIS OF HYPERTENSION (ICD-796.2) monitor BP goal BP < 135/85  Complete Medication List: 1)  Maxalt-mlt 10 Mg Tbdp (Rizatriptan benzoate) .... As needed 2)  Protonix 40 Mg Tbec (Pantoprazole sodium) .... Take 1 tablet by mouth once a day 3)  Claritin 10 Mg Tabs (Loratadine) .... Take 1 tablet by mouth once a day     ]  Preventive Care Screening  Colonoscopy:    Date:  08/06/2004    Next Due:  08/2011    Results:  normal

## 2010-09-08 NOTE — Letter (Signed)
Summary: [facility]  [facility]   Imported By: Maryln Gottron 10/07/2008 15:55:39  _____________________________________________________________________  External Attachment:    Type:   Image     Comment:   External Document

## 2010-10-30 ENCOUNTER — Other Ambulatory Visit: Payer: Self-pay | Admitting: Internal Medicine

## 2010-10-30 DIAGNOSIS — K219 Gastro-esophageal reflux disease without esophagitis: Secondary | ICD-10-CM

## 2010-12-22 NOTE — Procedures (Signed)
[place]. [facility]  Patient:    Jorge Kelly, Jorge Kelly                         MRN: [id number] Proc. Date: 01/23/00 Adm. Date:  [id number] Disc. Date: [id number] Attending:  Colon Branch CC:         Noralyn Pick. Eden Emms, M.D. [facility]                           Procedure Report  PROCEDURE:  Upper endoscopy.  ENDOSCOPIST:  Hedwig Morton. Juanda Chance, M.D.  INDICATION:  This 55 year old gentleman was admitted yesterday with substernal and subxiphoid chest pain and additional symptoms of intermittent solid food dysphagia as well as gastroesophageal reflux which has been only partially controlled with H2 receptor antagonist.  Cardiac workup has been in progress. The patient has is undergoing GI evaluation for possible esophageal stricture and reflux esophagitis.  INSTRUMENTS:  Olympus single-channel video endoscope.  PREMEDICATION:  Versed 7 mg and Demerol 80 mg IV.  DESCRIPTION OF PROCEDURE:  Olympus single-channel endoscope passed directly into the posterior pharynx and into the esophagus.  The patient was monitored by pulse oximeter.  His oxygen saturations were normal.  He was very cooperative.  Gag reflex was normal.  Proximal and mid-esophageal mucosa was unremarkable with no evidence of inflammatory lesions.  In the distal esophagus, there were at least two linear erosions-one of them longer than 5 mm, surrounded by thick fibrotic tissue and mild esophageal stricture which made it about 12 mm in diameter.  The stricture appeared benign.  It allowed the endoscope to traverse through into the stomach but it was somewhat eccentric.  There was no bleeding from the stricture as the endoscope advanced through it.  There was no significant hiatal hernia.  The stomach was insufflated with air and some multiple antral erosions with coffee-ground specks in the prepyloric antrum indicating acute inflammation. There was no ulcer.  Retroflexion of endoscope revealed normal fundus  and cardia.  CLOtest was taken from gastric antrum.  Pyloric outlet was normal.  The duodenum bulb was edematous, erythematous with patches of erosions all the way through the descending duodenum.  There was no duodenal ulcer and no obstruction.  Video photographs were obtained.  Maloney dilator of 42 [place], 46 [place], and 48 [place] passed through the esophagus blindly without difficulty.  There was blood tinged on the dilator.  The patient tolerated the procedure well.  IMPRESSION: 1. Grade 2 reflux esophagitis with benign distal esophageal stricture. 2. Status post dilatation to 33 [place]. 3. Antral gastritis, status post CLOtest. 4. Duodenitis.  PLAN:  The patient is showing extensive inflammatory changes of the esophagus, stomach, and duodenum-most likely related to hyperacidity.  His chest pain could be explained on the basis of the above-endoscopic findings.  We need to step up his acid suppression regimen and also obtain serum gastrin to rule out Zollinger-Ellisons syndrome.  He will follow up with Dr. Barbette Hair. Arlyce Dice in the next four to six weeks to adjust his dose of PPIs currently being on Protonix 40 mg b.i.d.  He will continue to avoid aspirin, NSAIDs, alcohol, as well as smoking. DD:  01/23/00 TD:  01/25/00 Job: [id number]

## 2010-12-22 NOTE — Discharge Summary (Signed)
[place]. [facility]  Patient:    Jorge Kelly, Jorge Kelly                           MRN: [id number] Adm. Date:  01/22/00 Disc. Date: 01/23/00 Attending:  Noralyn Pick. Eden Emms, M.D. [facility] Dictator:   Abelino Derrick, P.A.C. [facility] CC:         Peter C. Eden Emms, M.D. [facility]                           Discharge Summary  [name] is a 55 year old male with a family history of coronary disease who has a long history of GI upset and gastroesophageal reflux symptoms.  He presented to the emergency room January 22, 2000, with chest pain.  He was seen by Dr. Eden Emms on admission because his troponin was elevated at 0.35.  EKG showed no acute changes.  Subsequently troponins were also elevated at 0.31 and 0.28.  CKs were negative.  His EKG showed no acute changes.  He was set up for a Cardiolite scan which was done January 23, 2000, which showed no ischemia but an EF of 44%.  GI workup was undertaken.  The patient was seen by Dr. Lina Sar.  Endoscopy revealed gastritis and duodenal erosions.  He also had a small stricture that was dilated.  We feel he is safe for discharge late on June 19.  He will have a followup echocardiogram as an outpatient to assess his LV function.  We did add Norvasc 2.5 mg a day for the possibility of coronary spasm.  The GI service has recommended Protonix 40 mg twice a day.  LABORATORY DATA:  White count 5.9, hemoglobin 15.8, hematocrit 43.3, platelets 184.  INR 1.0, sodium 142, potassium 3.8, BUN 15, creatinine 1.0.  Chest x-ray shows no active disease.  DISPOSITION:  The patient is discharged in stable condition and will follow up with Dr. Eden Emms on July 20.  He has an echo scheduled June 27.  He has a followup appointment with Dr. Arlyce Dice in the GI clinic July 9. DD:  01/23/00 TD:  01/23/00 Job: [id number]

## 2011-01-10 ENCOUNTER — Other Ambulatory Visit: Payer: Self-pay | Admitting: Internal Medicine

## 2011-02-15 ENCOUNTER — Other Ambulatory Visit: Payer: Self-pay | Admitting: Internal Medicine

## 2011-03-12 ENCOUNTER — Other Ambulatory Visit: Payer: Self-pay | Admitting: Internal Medicine

## 2011-04-25 ENCOUNTER — Ambulatory Visit (INDEPENDENT_AMBULATORY_CARE_PROVIDER_SITE_OTHER): Payer: BC Managed Care – PPO | Admitting: Internal Medicine

## 2011-04-25 ENCOUNTER — Encounter: Payer: Self-pay | Admitting: Internal Medicine

## 2011-04-25 VITALS — BP 128/80 | Temp 98.2°F | Ht 74.0 in | Wt 232.0 lb

## 2011-04-25 DIAGNOSIS — Z2911 Encounter for prophylactic immunotherapy for respiratory syncytial virus (RSV): Secondary | ICD-10-CM

## 2011-04-25 DIAGNOSIS — Z23 Encounter for immunization: Secondary | ICD-10-CM

## 2011-04-25 DIAGNOSIS — J019 Acute sinusitis, unspecified: Secondary | ICD-10-CM

## 2011-04-25 DIAGNOSIS — Z0000 Encounter for general adult medical examination without abnormal findings: Secondary | ICD-10-CM

## 2011-04-25 MED ORDER — AMOXICILLIN-POT CLAVULANATE 875-125 MG PO TABS: 1.0000 | ORAL_TABLET | Freq: Two times a day (BID) | ORAL | Status: AC

## 2011-04-25 NOTE — Patient Instructions (Signed)
Take your antibiotic as prescribed until ALL of it is gone, but stop if you develop a rash, swelling, or any side effects of the medication.  Contact our office as soon as possible if  there are side effects of the medication.  Mucinex D twice daily  Consider  saline irrigation  Call or return to clinic prn if these symptoms worsen or fail to improve as anticipated.

## 2011-04-25 NOTE — Progress Notes (Signed)
  Subjective:    Patient ID: Jorge Kelly, male    DOB: [birth year], 55 y.o.   MRN: [id number]  HPI  55 year old patient who is seen today for followup. Approximately 10 days ago he developed some sore throat symptoms that resolved subsequently there is some sinus congestion and cough. More recently has developed increased sinus congestion and right maxillary sinus pain. There's been no fever but he is to developed purulent sinus drainage he has had worsening pressure headache and drainage. He has been treated for acute sinusitis in the past    Review of Systems  Constitutional: Positive for fatigue. Negative for fever, chills and appetite change.  HENT: Positive for congestion and rhinorrhea. Negative for hearing loss, ear pain, sore throat, trouble swallowing, neck stiffness, dental problem, voice change and tinnitus.   Eyes: Negative for pain, discharge and visual disturbance.  Respiratory: Negative for cough, chest tightness, wheezing and stridor.   Cardiovascular: Negative for chest pain, palpitations and leg swelling.  Gastrointestinal: Negative for nausea, vomiting, abdominal pain, diarrhea, constipation, blood in stool and abdominal distention.  Genitourinary: Negative for urgency, hematuria, flank pain, discharge, difficulty urinating and genital sores.  Musculoskeletal: Negative for myalgias, back pain, joint swelling, arthralgias and gait problem.  Skin: Negative for rash.  Neurological: Negative for dizziness, syncope, speech difficulty, weakness, numbness and headaches.  Hematological: Negative for adenopathy. Does not bruise/bleed easily.  Psychiatric/Behavioral: Negative for behavioral problems and dysphoric mood. The patient is not nervous/anxious.        Objective:   Physical Exam  Constitutional: He is oriented to person, place, and time. He appears well-developed and well-nourished. No distress.       Blood pressure is normal  HENT:  Head: Normocephalic.  Right Ear:  External ear normal.  Left Ear: External ear normal.  Nose: Nose normal.  Mouth/Throat: Oropharynx is clear and moist.  Eyes: Conjunctivae and EOM are normal.       Right maxillary sinus tenderness  Neck: Normal range of motion.  Cardiovascular: Normal rate and normal heart sounds.   Pulmonary/Chest: Breath sounds normal.  Abdominal: Bowel sounds are normal.  Musculoskeletal: Normal range of motion. He exhibits no edema and no tenderness.  Neurological: He is alert and oriented to person, place, and time.  Psychiatric: He has a normal mood and affect. His behavior is normal.          Assessment & Plan:   Acute right maxillary sinusitis. We'll treat with Augmentin also treated with decongestants and expectorants and saline irrigation He will call if unimproved  History of shingles. The patient feels he developed shingles involving his left inner upper arm. He wishes a shingles vaccine today. He was told that his insurance may not cover this since he is less than 60. 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

## 2011-04-27 ENCOUNTER — Telehealth: Payer: Self-pay | Admitting: *Deleted

## 2011-04-27 MED ORDER — HYDROCODONE-HOMATROPINE 5-1.5 MG/5ML PO SYRP: 5.0000 mL | ORAL_SOLUTION | Freq: Four times a day (QID) | ORAL | Status: AC | PRN

## 2011-04-27 NOTE — Telephone Encounter (Signed)
Continue the antibiotics at least through the weekend, that if unimproved please call and generic Hydromet 6 ounces 1 teaspoon every 6 hours as needed for cough or congestion

## 2011-04-27 NOTE — Telephone Encounter (Signed)
Notifed pt by voice mail.

## 2011-04-27 NOTE — Telephone Encounter (Signed)
Pt came in this week to see Dr Kirtland Bouchard and was given Augmentin.  His symptoms have worsened, and has cough and sore throat.  Is asking if he should change antibiotics?????  Rx for cough?

## 2011-07-12 ENCOUNTER — Telehealth: Payer: Self-pay | Admitting: *Deleted

## 2011-07-12 NOTE — Telephone Encounter (Signed)
Pt is having flu like symptoms- fever, muscle aches, fever, diarrhea, cold like symptoms. Pt wants to know if there is anything that could be called in. Pt has tried tylenol and motrin otc. Advise rest, plenty of fluids, tylenol or motrin for the fever and lots of hand washing. Told pt that I would see if Dr. Cato Mulligan had any other advise.

## 2011-07-13 NOTE — Telephone Encounter (Signed)
Probably nothing to do except stay well hydrated... Advil/tylenol for discomfort

## 2011-07-13 NOTE — Telephone Encounter (Signed)
Pt aware.

## 2011-07-20 ENCOUNTER — Ambulatory Visit: Payer: BC Managed Care – PPO | Admitting: Family Medicine

## 2011-10-18 ENCOUNTER — Other Ambulatory Visit: Payer: Self-pay | Admitting: Internal Medicine

## 2011-10-29 ENCOUNTER — Other Ambulatory Visit: Payer: Self-pay | Admitting: Internal Medicine

## 2011-11-12 ENCOUNTER — Other Ambulatory Visit: Payer: Self-pay | Admitting: Internal Medicine

## 2011-12-16 ENCOUNTER — Other Ambulatory Visit: Payer: Self-pay | Admitting: Internal Medicine

## 2012-07-18 ENCOUNTER — Encounter: Payer: Self-pay | Admitting: Internal Medicine

## 2012-07-18 ENCOUNTER — Ambulatory Visit (INDEPENDENT_AMBULATORY_CARE_PROVIDER_SITE_OTHER): Payer: BC Managed Care – PPO | Admitting: Internal Medicine

## 2012-07-18 VITALS — BP 134/100 | HR 88 | Temp 98.4°F | Ht 73.5 in | Wt 241.0 lb

## 2012-07-18 DIAGNOSIS — G43909 Migraine, unspecified, not intractable, without status migrainosus: Secondary | ICD-10-CM

## 2012-07-18 DIAGNOSIS — R030 Elevated blood-pressure reading, without diagnosis of hypertension: Secondary | ICD-10-CM

## 2012-07-18 DIAGNOSIS — K219 Gastro-esophageal reflux disease without esophagitis: Secondary | ICD-10-CM

## 2012-07-18 MED ORDER — RIZATRIPTAN BENZOATE 10 MG PO TBDP: 10.0000 mg | ORAL_TABLET | ORAL | Status: DC | PRN

## 2012-07-18 MED ORDER — PANTOPRAZOLE SODIUM 40 MG PO TBEC: 40.0000 mg | DELAYED_RELEASE_TABLET | Freq: Every day | ORAL | Status: DC

## 2012-07-18 NOTE — Assessment & Plan Note (Signed)
Refill meds

## 2012-07-18 NOTE — Progress Notes (Signed)
Patient ID: Jorge Kelly, male   DOB: February 21, 1956, 56 y.o.   MRN: [id number] gerd- protonix works better than prilosec  Migraine- maxalt works well for headaches.   No other complaints  Exam:  Overweight  Chest- no increased wob Gait normal

## 2012-07-18 NOTE — Assessment & Plan Note (Signed)
Controlled with maxalt Continue meds

## 2012-07-18 NOTE — Assessment & Plan Note (Signed)
Monitor bp at home

## 2012-10-21 ENCOUNTER — Other Ambulatory Visit: Payer: Self-pay | Admitting: Internal Medicine

## 2012-11-17 ENCOUNTER — Other Ambulatory Visit: Payer: Self-pay | Admitting: Internal Medicine

## 2013-03-02 ENCOUNTER — Ambulatory Visit (INDEPENDENT_AMBULATORY_CARE_PROVIDER_SITE_OTHER): Payer: BC Managed Care – PPO | Admitting: Family Medicine

## 2013-03-02 ENCOUNTER — Encounter: Payer: Self-pay | Admitting: Family Medicine

## 2013-03-02 VITALS — BP 124/82 | HR 112 | Temp 97.7°F | Wt 235.0 lb

## 2013-03-02 DIAGNOSIS — N529 Male erectile dysfunction, unspecified: Secondary | ICD-10-CM

## 2013-03-02 MED ORDER — TADALAFIL 20 MG PO TABS: ORAL_TABLET | ORAL | Status: DC

## 2013-03-02 NOTE — Progress Notes (Signed)
  Subjective:    Patient ID: Jorge Kelly, male    DOB: [birth year], 57 y.o.   MRN: [id number]  HPI Patient seen with some progressive issues with erectile dysfunction of the past year He is happily married. No problems with libido. Nonsmoker. No history of peripheral vascular disease. No excessive alcohol use. He easily is able to obtain erection but difficulty maintaining.  Past Medical History  Diagnosis Date  . Actinic keratosis 10/24/2009  . ALLERGIC RHINITIS 05/11/2007  . ELEVATED BLOOD PRESSURE WITHOUT DIAGNOSIS OF HYPERTENSION 11/12/2007  . GERD 05/11/2007  . MIGRAINE HEADACHE 05/11/2007  . SINUSITIS- ACUTE-NOS 05/10/2010  . TINEA CRURIS 10/24/2009   Past Surgical History  Procedure Laterality Date  . Ankle fracture surgery      x2    reports that he has never smoked. He has never used smokeless tobacco. He reports that  drinks alcohol. He reports that he does not use illicit drugs. family history is not on file. No Known Allergies    Review of Systems  Constitutional: Negative for fatigue.  Respiratory: Negative for cough and shortness of breath.   Cardiovascular: Negative for chest pain.  Neurological: Negative for dizziness and headaches.  Psychiatric/Behavioral: Negative for dysphoric mood.       Objective:   Physical Exam  Constitutional: He is oriented to person, place, and time. He appears well-developed and well-nourished.  Cardiovascular: Normal rate and regular rhythm.   Pulmonary/Chest: Effort normal and breath sounds normal. No respiratory distress. He has no wheezes. He has no rales.  Musculoskeletal: He exhibits no edema.  Neurological: He is alert and oriented to person, place, and time.          Assessment & Plan:  Erectile dysfunction. Trial of Cialis 20 mg every other day as needed

## 2013-03-05 ENCOUNTER — Telehealth: Payer: Self-pay | Admitting: Internal Medicine

## 2013-03-05 NOTE — Telephone Encounter (Signed)
PT states that his insurance isn't covering his RX of tadalafil (CIALIS) 20 MG tablet, and he would like to get a [facility] [address]. Please assist.

## 2013-03-05 NOTE — Telephone Encounter (Signed)
Left message for pt to call back  °

## 2013-03-05 NOTE — Telephone Encounter (Signed)
Have him check with insurance about levitra, or viagra His insurance may not cover any of them

## 2013-03-06 NOTE — Telephone Encounter (Signed)
Pt aware, he will call insurance and call back.

## 2013-09-02 ENCOUNTER — Encounter: Payer: Self-pay | Admitting: Family Medicine

## 2013-09-02 ENCOUNTER — Ambulatory Visit (INDEPENDENT_AMBULATORY_CARE_PROVIDER_SITE_OTHER): Payer: BC Managed Care – PPO | Admitting: Family Medicine

## 2013-09-02 VITALS — BP 100/78 | Temp 97.9°F | Wt 248.0 lb

## 2013-09-02 DIAGNOSIS — E669 Obesity, unspecified: Secondary | ICD-10-CM

## 2013-09-02 DIAGNOSIS — H539 Unspecified visual disturbance: Secondary | ICD-10-CM

## 2013-09-02 DIAGNOSIS — I10 Essential (primary) hypertension: Secondary | ICD-10-CM

## 2013-09-02 LAB — BASIC METABOLIC PANEL
BUN: 18 mg/dL (ref 6–23)
CO2: 22 mEq/L (ref 19–32)
Calcium: 9.4 mg/dL (ref 8.4–10.5)
Chloride: 109 mEq/L (ref 96–112)
Creatinine, Ser: 1 mg/dL (ref 0.4–1.5)
GFR: 86.04 mL/min (ref >60.00)
Glucose, Bld: 106 mg/dL — ABNORMAL HIGH (ref 70–99)
Potassium: 4 mEq/L (ref 3.5–5.1)
Sodium: 138 mEq/L (ref 135–145)

## 2013-09-02 LAB — LIPID PANEL
Cholesterol: 162 mg/dL (ref 0–200)
HDL: 37.2 mg/dL — ABNORMAL LOW (ref >39.00)
Total CHOL/HDL Ratio: 4
Triglycerides: 217 mg/dL — ABNORMAL HIGH (ref 0.0–149.0)
VLDL: 43.4 mg/dL — ABNORMAL HIGH (ref 0.0–40.0)

## 2013-09-02 LAB — HEMOGLOBIN A1C: Hgb A1c MFr Bld: 5.2 % (ref 4.6–6.5)

## 2013-09-02 LAB — LDL CHOLESTEROL, DIRECT: Direct LDL: 96.1 mg/dL

## 2013-09-02 LAB — TSH: TSH: 1.69 u[IU]/mL (ref 0.35–5.50)

## 2013-09-02 LAB — T4, FREE: Free T4: 0.96 ng/dL (ref 0.60–1.60)

## 2013-09-02 NOTE — Patient Instructions (Signed)
-  We have ordered labs or studies at this visit. It can take up to 1-2 weeks for results and processing. We will contact you with instructions IF your results are abnormal. Normal results will be released to your [facility]. If you have not heard from [place] or can not find your results in [facility] in 2 weeks please contact our office.  -schedule a physical and follow up with your doctor at next available  -if symptoms persist after adjusting to your new glasses in a few weeks please call numbers provided to schedule an appointment with an opthalmologist

## 2013-09-02 NOTE — Progress Notes (Signed)
Chief Complaint  Patient presents with  . Blurred Vision    HPI:  Jorge Kelly is a 58 yo pt of Dr. Leanne Kelly w/ PMH sig for migraines and sinusitis and allergies here for an acute visit for:  Visual disturbance: Started gradually over the last 8 months - both eyes, seems worse at night Saw optometrist and change in prescription - 1 week ago - optho told him it was not of concern  and was simply eye strain (he works on a computer all day) but pt wife wants him to have labs for diabetes Still noticing vision is a little blurred and possibly double vision at times Wife is worried about diabetes because he also has had increased urinary frequency Denies: HAs (actually less migraines), malaise, fevers, weight loss, pain in eyes, fasciculations, drooping eyelids, weakness, numbness Does have chronic allergies with chronic nasal symptoms and itchy watery eyes   ROS: See pertinent positives and negatives per HPI.  Past Medical History  Diagnosis Date  . Actinic keratosis 10/24/2009  . ALLERGIC RHINITIS 05/11/2007  . ELEVATED BLOOD PRESSURE WITHOUT DIAGNOSIS OF HYPERTENSION 11/12/2007  . GERD 05/11/2007  . MIGRAINE HEADACHE 05/11/2007  . SINUSITIS- ACUTE-NOS 05/10/2010  . TINEA CRURIS 10/24/2009    Past Surgical History  Procedure Laterality Date  . Ankle fracture surgery      x2    No family history on file.  History   Social History  . Marital Status: Married    Spouse Name: N/A    Number of Children: N/A  . Years of Education: N/A   Social History Main Topics  . Smoking status: Never Smoker   . Smokeless tobacco: Never Used  . Alcohol Use: Yes  . Drug Use: No  . Sexual Activity: None   Other Topics Concern  . None   Social History Narrative  . None    Current outpatient prescriptions:rizatriptan (MAXALT-MLT) 10 MG disintegrating tablet, Take 1 tablet (10 mg total) by mouth as needed for migraine. May repeat in 2 hours if needed, Disp: 9 tablet, Rfl: 11;  PROTONIX 40 MG  tablet, TAKE ONE TABLET BY MOUTH ONE TIME DAILY, Disp: 30 tablet, Rfl: 5;  tadalafil (CIALIS) 20 MG tablet, One every other day prn, Disp: 10 tablet, Rfl: 11  EXAM:  Filed Vitals:   09/02/13 0804  BP: 100/78  Temp: 97.9 F (36.6 C)    Body mass index is 32.27 kg/(m^2).  GENERAL: vitals reviewed and listed above, alert, oriented, appears well hydrated and in no acute distress  HEENT: atraumatic, no dropping of lids or asymmetry to face, conjunttiva clear, PERRLA, visual acuity grossly intact no obvious abnormalities on inspection of external nose and ears  NECK: no obvious masses on inspection  LUNGS: clear to auscultation bilaterally, no wheezes, rales or rhonchi, good air movement  CV: HRRR, no peripheral edema  MS: moves all extremities without noticeable abnormality  PSYCH: pleasant and cooperative, no obvious depression or anxiety  NEURO: gait normal, CN II-XII grossly intact, finger to nose normal  ASSESSMENT AND PLAN:  Discussed the following assessment and plan:  Obesity, unspecified - Plan: Lipid Panel, Hemoglobin A1c, TSH, T4, Free, Basic metabolic panel  Essential hypertension, benign  Vision changes - Plan: Hemoglobin A1c  -we discussed possible serious and likely etiologies, workup and treatment, treatment risks and return precautions -after this discussion, Jorge Kelly opted for basic labs which was his primary reason for the appt as has gain weight since last physical and has not had regular  check ups; follow up with PCP; follow up with an opthomologist if vision concerns persist -of course, we advised Jorge Kelly  to return or notify a doctor immediately if symptoms worsen or persist or new concerns arise.  .  -Patient advised to return or notify a doctor immediately if symptoms worsen or persist or new concerns arise.  Patient Instructions  -We have ordered labs or studies at this visit. It can take up to 1-2 weeks for results and processing. We will contact you with  instructions IF your results are abnormal. Normal results will be released to your [facility]. If you have not heard from [place] or can not find your results in [facility] in 2 weeks please contact our office.  -schedule a physical and follow up with your doctor at next available  -if symptoms persist after adjusting to your new glasses in a few weeks please call numbers provided to schedule an appointment with an opthalmologist           Jorge Kelly, Jorge Kelly

## 2013-09-02 NOTE — Progress Notes (Signed)
Pre visit review using our clinic review tool, if applicable. No additional management support is needed unless otherwise documented below in the visit note. 

## 2013-09-07 ENCOUNTER — Telehealth: Payer: Self-pay | Admitting: Internal Medicine

## 2013-09-07 NOTE — Telephone Encounter (Signed)
Relevant patient education mailed to patient.  

## 2013-10-09 ENCOUNTER — Other Ambulatory Visit: Payer: Self-pay | Admitting: Internal Medicine

## 2013-11-20 ENCOUNTER — Other Ambulatory Visit (INDEPENDENT_AMBULATORY_CARE_PROVIDER_SITE_OTHER): Payer: BC Managed Care – PPO

## 2013-11-20 DIAGNOSIS — Z0000 Encounter for general adult medical examination without abnormal findings: Secondary | ICD-10-CM

## 2013-11-20 LAB — POCT URINALYSIS DIPSTICK
Bilirubin, UA: NEGATIVE
Blood, UA: NEGATIVE
Glucose, UA: NEGATIVE
Ketones, UA: NEGATIVE
Leukocytes, UA: NEGATIVE
Nitrite, UA: NEGATIVE
Protein, UA: NEGATIVE
Spec Grav, UA: 1.025
Urobilinogen, UA: 0.2
pH, UA: 5.5

## 2013-11-20 LAB — HEPATIC FUNCTION PANEL
ALT: 23 U/L (ref 0–53)
AST: 13 U/L (ref 0–37)
Albumin: 4.1 g/dL (ref 3.5–5.2)
Alkaline Phosphatase: 56 U/L (ref 39–117)
Bilirubin, Direct: 0.2 mg/dL (ref 0.0–0.3)
Total Bilirubin: 1.6 mg/dL — ABNORMAL HIGH (ref 0.3–1.2)
Total Protein: 6.4 g/dL (ref 6.0–8.3)

## 2013-11-20 LAB — BASIC METABOLIC PANEL
BUN: 18 mg/dL (ref 6–23)
CO2: 25 mEq/L (ref 19–32)
Calcium: 9.4 mg/dL (ref 8.4–10.5)
Chloride: 107 mEq/L (ref 96–112)
Creatinine, Ser: 0.9 mg/dL (ref 0.4–1.5)
GFR: 98.94 mL/min (ref >60.00)
Glucose, Bld: 94 mg/dL (ref 70–99)
Potassium: 4.3 mEq/L (ref 3.5–5.1)
Sodium: 140 mEq/L (ref 135–145)

## 2013-11-20 LAB — CBC WITH DIFFERENTIAL/PLATELET
Basophils Absolute: 0 10*3/uL (ref 0.0–0.1)
Basophils Relative: 0.5 % (ref 0.0–3.0)
Eosinophils Absolute: 0.2 10*3/uL (ref 0.0–0.7)
Eosinophils Relative: 2.8 % (ref 0.0–5.0)
HCT: 47.8 % (ref 39.0–52.0)
Hemoglobin: 16.4 g/dL (ref 13.0–17.0)
Lymphocytes Relative: 24.4 % (ref 12.0–46.0)
Lymphs Abs: 1.6 10*3/uL (ref 0.7–4.0)
MCHC: 34.3 g/dL (ref 30.0–36.0)
MCV: 91.9 fl (ref 78.0–100.0)
Monocytes Absolute: 0.4 10*3/uL (ref 0.1–1.0)
Monocytes Relative: 6 % (ref 3.0–12.0)
Neutro Abs: 4.5 10*3/uL (ref 1.4–7.7)
Neutrophils Relative %: 66.3 % (ref 43.0–77.0)
Platelets: 145 10*3/uL — ABNORMAL LOW (ref 150.0–400.0)
RBC: 5.2 Mil/uL (ref 4.22–5.81)
RDW: 13.1 % (ref 11.5–14.6)
WBC: 6.8 10*3/uL (ref 4.5–10.5)

## 2013-11-20 LAB — LIPID PANEL
Cholesterol: 162 mg/dL (ref 0–200)
HDL: 39 mg/dL — ABNORMAL LOW (ref >39.00)
LDL Cholesterol: 102 mg/dL — ABNORMAL HIGH (ref 0–99)
Total CHOL/HDL Ratio: 4
Triglycerides: 107 mg/dL (ref 0.0–149.0)
VLDL: 21.4 mg/dL (ref 0.0–40.0)

## 2013-11-20 LAB — PSA: PSA: 2.16 ng/mL (ref 0.10–4.00)

## 2013-11-20 LAB — TSH: TSH: 1.74 u[IU]/mL (ref 0.35–5.50)

## 2013-11-20 NOTE — Addendum Note (Signed)
Addended by: Donnita Falls on: 11/20/2013 08:34 AM   Modules accepted: Orders

## 2013-11-27 ENCOUNTER — Encounter: Payer: BC Managed Care – PPO | Admitting: Internal Medicine

## 2013-11-29 NOTE — Progress Notes (Signed)
cpx  Past Medical History  Diagnosis Date  . Actinic keratosis 10/24/2009  . ALLERGIC RHINITIS 05/11/2007  . ELEVATED BLOOD PRESSURE WITHOUT DIAGNOSIS OF HYPERTENSION 11/12/2007  . GERD 05/11/2007  . MIGRAINE HEADACHE 05/11/2007  . SINUSITIS- ACUTE-NOS 05/10/2010  . TINEA CRURIS 10/24/2009    History   Social History  . Marital Status: Married    Spouse Name: N/A    Number of Children: N/A  . Years of Education: N/A   Occupational History  . Not on file.   Social History Main Topics  . Smoking status: Never Smoker   . Smokeless tobacco: Never Used  . Alcohol Use: Yes  . Drug Use: No  . Sexual Activity: Not on file   Other Topics Concern  . Not on file   Social History Narrative  . No narrative on file    Past Surgical History  Procedure Laterality Date  . Ankle fracture surgery      x2    No family history on file.  No Known Allergies  Current Outpatient Prescriptions on File Prior to Visit  Medication Sig Dispense Refill  . PROTONIX 40 MG tablet TAKE ONE TABLET BY MOUTH ONE TIME DAILY  30 tablet  5  . rizatriptan (MAXALT-MLT) 10 MG disintegrating tablet Take 1 tab as soon as headache starts, may repeat in 2 hours if no relief.  9 tablet  0  . tadalafil (CIALIS) 20 MG tablet One every other day prn  10 tablet  11   No current facility-administered medications on file prior to visit.     patient denies chest pain, shortness of breath, orthopnea. Denies lower extremity edema, abdominal pain, change in appetite, change in bowel movements. Patient denies rashes, musculoskeletal complaints. No other specific complaints in a complete review of systems.   Reviewed vitals  well-developed well-nourished male in no acute distress. HEENT exam atraumatic, normocephalic, neck supple without jugular venous distention. Chest clear to auscultation cardiac exam S1-S2 are regular. Abdominal exam overweight with bowel sounds, soft and nontender. Extremities no edema. Neurologic exam  is alert with a normal gait.   Well Visit- health maint utd He needs to lose weight  Multiple skin tags-- needs moles checked- dermatology ED- trial Viagra

## 2013-11-30 ENCOUNTER — Ambulatory Visit (INDEPENDENT_AMBULATORY_CARE_PROVIDER_SITE_OTHER): Payer: BC Managed Care – PPO | Admitting: Internal Medicine

## 2013-11-30 ENCOUNTER — Encounter: Payer: Self-pay | Admitting: Internal Medicine

## 2013-11-30 VITALS — BP 112/80 | HR 92 | Temp 97.9°F | Ht 73.75 in | Wt 244.0 lb

## 2013-11-30 DIAGNOSIS — Z0000 Encounter for general adult medical examination without abnormal findings: Secondary | ICD-10-CM

## 2013-11-30 DIAGNOSIS — L570 Actinic keratosis: Secondary | ICD-10-CM

## 2013-11-30 DIAGNOSIS — R739 Hyperglycemia, unspecified: Secondary | ICD-10-CM | POA: Insufficient documentation

## 2013-11-30 DIAGNOSIS — Z23 Encounter for immunization: Secondary | ICD-10-CM

## 2013-11-30 DIAGNOSIS — R7309 Other abnormal glucose: Secondary | ICD-10-CM

## 2013-11-30 NOTE — Progress Notes (Signed)
Pre visit review using our clinic review tool, if applicable. No additional management support is needed unless otherwise documented below in the visit note. 

## 2013-11-30 NOTE — Patient Instructions (Signed)
Try Saw Palmetto 160 mg daily (over the counter). Take it for 4 weeks and see if you notice any difference with urinary issues.

## 2014-01-04 ENCOUNTER — Ambulatory Visit (INDEPENDENT_AMBULATORY_CARE_PROVIDER_SITE_OTHER): Payer: BC Managed Care – PPO | Admitting: Physician Assistant

## 2014-01-04 ENCOUNTER — Encounter: Payer: Self-pay | Admitting: Physician Assistant

## 2014-01-04 VITALS — BP 110/84 | HR 78 | Temp 98.0°F | Resp 18 | Wt 243.0 lb

## 2014-01-04 DIAGNOSIS — C439 Malignant melanoma of skin, unspecified: Secondary | ICD-10-CM

## 2014-01-04 DIAGNOSIS — J029 Acute pharyngitis, unspecified: Secondary | ICD-10-CM

## 2014-01-04 DIAGNOSIS — Z85820 Personal history of malignant melanoma of skin: Secondary | ICD-10-CM | POA: Insufficient documentation

## 2014-01-04 DIAGNOSIS — Z85828 Personal history of other malignant neoplasm of skin: Secondary | ICD-10-CM | POA: Insufficient documentation

## 2014-01-04 DIAGNOSIS — R059 Cough, unspecified: Secondary | ICD-10-CM

## 2014-01-04 DIAGNOSIS — R05 Cough: Secondary | ICD-10-CM

## 2014-01-04 HISTORY — DX: Malignant melanoma of skin, unspecified: C43.9

## 2014-01-04 LAB — POCT RAPID STREP A (OFFICE): Rapid Strep A Screen: NEGATIVE

## 2014-01-04 MED ORDER — HYDROCODONE-HOMATROPINE 5-1.5 MG/5ML PO SYRP: 5.0000 mL | ORAL_SOLUTION | Freq: Four times a day (QID) | ORAL | Status: DC | PRN

## 2014-01-04 MED ORDER — HYDROCODONE-HOMATROPINE 5-1.5 MG/5ML PO SYRP: 5.0000 mL | ORAL_SOLUTION | Freq: Every evening | ORAL | Status: DC | PRN

## 2014-01-04 NOTE — Progress Notes (Signed)
Pre visit review using our clinic review tool, if applicable. No additional management support is needed unless otherwise documented below in the visit note. 

## 2014-01-04 NOTE — Patient Instructions (Addendum)
Salt water gargles and throat lozenges and throat sprays may be beneficial for your sore throat.  Ibuprofen can be beneficial for aches.  Tylenol can help mild fever symptoms.  Hycodan 5 mL every 6 hours as needed for cough. No driving while taking this medication as it inhibit your ability to operate a motor vehicle.  Plain Over the Counter Mucinex (NOT Mucinex D) for thick secretions  Force NON dairy fluids, drinking plenty of water is best.    Over the Counter Flonase OR Nasacort AQ 1 spray in each nostril twice a day as needed. Use the "crossover" technique into opposite nostril spraying toward opposite ear @ 45 degree angle, not straight up into nostril.   Plain Over the Counter Allegra (NOT D )  160 daily , OR Loratidine 10 mg , OR Zyrtec 10 mg @ bedtime  as needed for itchy eyes & sneezing.  Saline Irrigation and Saline Sprays can also help reduce symptoms.  Follow up if symptoms worsen, you develop a fever of 100.4 F or greater that is not helped by tylenol, or you are not getting better in 5 to 7 days despite conservative treatment.     Pharyngitis Pharyngitis is a sore throat (pharynx). There is redness, pain, and swelling of your throat. HOME CARE   Drink enough fluids to keep your pee (urine) clear or pale yellow.  Only take medicine as told by your doctor.  You may get sick again if you do not take medicine as told. Finish your medicines, even if you start to feel better.  Do not take aspirin.  Rest.  Rinse your mouth (gargle) with salt water ( tsp of salt per 1 qt of water) every 1 2 hours. This will help the pain.  If you are not at risk for choking, you can suck on hard candy or sore throat lozenges. GET HELP IF:  You have large, tender lumps on your neck.  You have a rash.  You cough up green, yellow-brown, or bloody spit. GET HELP RIGHT AWAY IF:   You have a stiff neck.  You drool or cannot swallow liquids.  You throw up (vomit) or are not able  to keep medicine or liquids down.  You have very bad pain that does not go away with medicine.  You have problems breathing (not from a stuffy nose). MAKE SURE YOU:   Understand these instructions.  Will watch your condition.  Will get help right away if you are not doing well or get worse. Document Released: 01/09/2008 Document Revised: 05/13/2013 Document Reviewed: 03/30/2013 [facility] Patient Information 2014 [place].

## 2014-01-04 NOTE — Progress Notes (Signed)
Subjective:    Patient ID: Jorge Kelly, male    DOB: 1956-04-28, 58 y.o.   MRN: [id number]  Sore Throat  This is a new problem. The current episode started in the past 7 days (2-3 days). The problem has been gradually worsening. Neither side of throat is experiencing more pain than the other. Maximum temperature: Felt Feverish, no documented temperature. Felt chilly last night. The fever has been present for less than 1 day. The pain is at a severity of 6/10. The pain is moderate. Associated symptoms include congestion, coughing, headaches and a hoarse voice. Pertinent negatives include no abdominal pain, diarrhea, drooling, ear discharge, ear pain, plugged ear sensation, neck pain, shortness of breath, stridor, swollen glands, trouble swallowing or vomiting. He has had exposure to strep (son with strep 2 weeks ago). He has had no exposure to mono. He has tried acetaminophen (mucinex, claritin) for the symptoms. The treatment provided no relief.    Review of Systems  Constitutional: Positive for fever and chills.  HENT: Positive for congestion and hoarse voice. Negative for drooling, ear discharge, ear pain and trouble swallowing.   Respiratory: Positive for cough. Negative for shortness of breath and stridor.   Cardiovascular: Negative for chest pain.  Gastrointestinal: Negative for nausea, vomiting, abdominal pain and diarrhea.  Musculoskeletal: Negative for neck pain.  Neurological: Positive for headaches.  All other systems reviewed and are negative.    Past Medical History  Diagnosis Date  . Actinic keratosis 10/24/2009  . ALLERGIC RHINITIS 05/11/2007  . ELEVATED BLOOD PRESSURE WITHOUT DIAGNOSIS OF HYPERTENSION 11/12/2007  . GERD 05/11/2007  . MIGRAINE HEADACHE 05/11/2007  . SINUSITIS- ACUTE-NOS 05/10/2010  . TINEA CRURIS 10/24/2009   Past Surgical History  Procedure Laterality Date  . Ankle fracture surgery      x2    reports that he has never smoked. He has never used smokeless  tobacco. He reports that he drinks alcohol. He reports that he does not use illicit drugs. family history is not on file. No Known Allergies     Objective:   Physical Exam  Nursing note and vitals reviewed. Constitutional: He is oriented to person, place, and time. He appears well-developed and well-nourished. No distress.  HENT:  Head: Normocephalic and atraumatic.  Right Ear: External ear normal.  Left Ear: External ear normal.  Nose: Nose normal.  Mouth/Throat: No oropharyngeal exudate.  Oropharynx slightly erythematous, no exudate.  Bilateral TMs normal.  Bilateral frontal and maxillary sinuses are nontender to palpation.  Eyes: Conjunctivae and EOM are normal. Pupils are equal, round, and reactive to light.  Neck: Normal range of motion. Neck supple. No JVD present.  Cardiovascular: Normal rate, regular rhythm, normal heart sounds and intact distal pulses.  Exam reveals no gallop and no friction rub.   No murmur heard. Pulmonary/Chest: Effort normal and breath sounds normal. No stridor. No respiratory distress. He has no wheezes. He has no rales. He exhibits no tenderness.  Musculoskeletal: Normal range of motion.  Lymphadenopathy:    He has no cervical adenopathy.  Neurological: He is alert and oriented to person, place, and time.  Skin: Skin is warm and dry. No rash noted. He is not diaphoretic. No erythema. No pallor.  Psychiatric: He has a normal mood and affect. His behavior is normal. Judgment and thought content normal.   Filed Vitals:   01/04/14 1306  BP: 110/84  Pulse: 78  Temp: 98 F (36.7 C)  Resp: 18    Lab Results  Component  Value Date   WBC 6.8 11/20/2013   HGB 16.4 11/20/2013   HCT 47.8 11/20/2013   PLT 145.0* 11/20/2013   GLUCOSE 94 11/20/2013   CHOL 162 11/20/2013   TRIG 107.0 11/20/2013   HDL 39.00* 11/20/2013   LDLDIRECT 96.1 09/02/2013   LDLCALC 102* 11/20/2013   ALT 23 11/20/2013   AST 13 11/20/2013   NA 140 11/20/2013   K 4.3 11/20/2013   CL 107  11/20/2013   CREATININE 0.9 11/20/2013   BUN 18 11/20/2013   CO2 25 11/20/2013   TSH 1.74 11/20/2013   PSA 2.16 11/20/2013   HGBA1C 5.2 09/02/2013        Assessment & Plan:  Jasun was seen today for sore throat.  Diagnoses and associated orders for this visit:  Sore throat Comments: Strep Negative. Back up culture, son with recent strep 2 weeks ago. Will treat symptomatically for now. - POC Rapid Strep A- Negative - Throat culture ([facility])  Cough Comments: Keeping up at night. Likely part of viral illness associated with Sore throat. Will treat with Hycodan at night, and OTC Mucinex.  - HYDROcodone-homatropine (HYCODAN) 5-1.5 MG/5ML syrup; Take 5 mLs by mouth every 6 (six) hours as needed for cough (No driving while taking medication. It inhibits your ability to operate a motor vehicle.).  Plan to follow up as needed, and if symptoms worsen or persist despite conservative treatment.  Patient Instructions  Salt water gargles and throat lozenges and throat sprays may be beneficial for your sore throat.  Ibuprofen can be beneficial for aches.  Tylenol can help mild fever symptoms.  Hycodan 5 mL every 6 hours as needed for cough. No driving while taking this medication as it inhibit your ability to operate a motor vehicle.  Plain Over the Counter Mucinex (NOT Mucinex D) for thick secretions  Force NON dairy fluids, drinking plenty of water is best.    Over the Counter Flonase OR Nasacort AQ 1 spray in each nostril twice a day as needed. Use the "crossover" technique into opposite nostril spraying toward opposite ear @ 45 degree angle, not straight up into nostril.   Plain Over the Counter Allegra (NOT D )  160 daily , OR Loratidine 10 mg , OR Zyrtec 10 mg @ bedtime  as needed for itchy eyes & sneezing.  Saline Irrigation and Saline Sprays can also help reduce symptoms.  Follow up if symptoms worsen, you develop a fever of 100.4 F or greater that is not helped by tylenol, or you are  not getting better in 5 to 7 days despite conservative treatment.

## 2014-01-06 LAB — CULTURE, GROUP A STREP: Organism ID, Bacteria: NORMAL

## 2014-03-22 ENCOUNTER — Other Ambulatory Visit: Payer: Self-pay | Admitting: Internal Medicine

## 2014-05-06 DIAGNOSIS — I4819 Other persistent atrial fibrillation: Secondary | ICD-10-CM

## 2014-05-06 HISTORY — DX: Other persistent atrial fibrillation: I48.19

## 2014-05-19 ENCOUNTER — Encounter (HOSPITAL_BASED_OUTPATIENT_CLINIC_OR_DEPARTMENT_OTHER): Payer: Self-pay | Admitting: *Deleted

## 2014-05-19 ENCOUNTER — Ambulatory Visit: Payer: Self-pay | Admitting: Ophthalmology

## 2014-05-19 NOTE — Progress Notes (Signed)
No labs needed

## 2014-05-19 NOTE — H&P (Signed)
  Date of examination:  05-04-14  Indication for surgery: to straighten the eyes and relieve double vision  Pertinent past medical history:  Past Medical History  Diagnosis Date  . Actinic keratosis 10/24/2009  . ALLERGIC RHINITIS 05/11/2007  . ELEVATED BLOOD PRESSURE WITHOUT DIAGNOSIS OF HYPERTENSION 11/12/2007  . GERD 05/11/2007  . MIGRAINE HEADACHE 05/11/2007  . SINUSITIS- ACUTE-NOS 05/10/2010  . TINEA CRURIS 10/24/2009    Pertinent ocular history:  Diplopia especially bothersome x 9 months but patient thinks it may have been present for many years  Pertinent family history: No family history on file.  General:  Healthy appearing patient in no distress.    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

## 2014-05-21 ENCOUNTER — Encounter (HOSPITAL_BASED_OUTPATIENT_CLINIC_OR_DEPARTMENT_OTHER): Payer: BC Managed Care – PPO | Admitting: Certified Registered"

## 2014-05-21 ENCOUNTER — Encounter (HOSPITAL_COMMUNITY)
Admission: RE | Disposition: A | Discharge status: Home / Self Care | Payer: Self-pay | Source: Ambulatory Visit | Attending: Cardiology

## 2014-05-21 ENCOUNTER — Encounter (HOSPITAL_BASED_OUTPATIENT_CLINIC_OR_DEPARTMENT_OTHER): Payer: Self-pay | Admitting: Certified Registered"

## 2014-05-21 ENCOUNTER — Inpatient Hospital Stay (HOSPITAL_BASED_OUTPATIENT_CLINIC_OR_DEPARTMENT_OTHER)
Admission: RE | Admit: 2014-05-21 | Discharge: 2014-05-22 | DRG: 989 | Disposition: A | Discharge status: Home / Self Care | Payer: BC Managed Care – PPO | Source: Ambulatory Visit | Attending: Cardiology | Admitting: Cardiology

## 2014-05-21 ENCOUNTER — Ambulatory Visit (HOSPITAL_BASED_OUTPATIENT_CLINIC_OR_DEPARTMENT_OTHER): Payer: BC Managed Care – PPO | Admitting: Certified Registered"

## 2014-05-21 DIAGNOSIS — H4910 Fourth [trochlear] nerve palsy, unspecified eye: Secondary | ICD-10-CM

## 2014-05-21 DIAGNOSIS — I4891 Unspecified atrial fibrillation: Principal | ICD-10-CM

## 2014-05-21 DIAGNOSIS — I480 Paroxysmal atrial fibrillation: Secondary | ICD-10-CM | POA: Diagnosis present

## 2014-05-21 DIAGNOSIS — H4911 Fourth [trochlear] nerve palsy, right eye: Secondary | ICD-10-CM | POA: Diagnosis present

## 2014-05-21 DIAGNOSIS — Z85820 Personal history of malignant melanoma of skin: Secondary | ICD-10-CM

## 2014-05-21 DIAGNOSIS — R030 Elevated blood-pressure reading, without diagnosis of hypertension: Secondary | ICD-10-CM

## 2014-05-21 HISTORY — PX: STRABISMUS SURGERY: SHX218

## 2014-05-21 LAB — CBC WITH DIFFERENTIAL/PLATELET
Basophils Absolute: 0 10*3/uL (ref 0.0–0.1)
Basophils Relative: 0 % (ref 0–1)
Eosinophils Absolute: 0 10*3/uL (ref 0.0–0.7)
Eosinophils Relative: 0 % (ref 0–5)
HCT: 46.7 % (ref 39.0–52.0)
Hemoglobin: 16.4 g/dL (ref 13.0–17.0)
Lymphocytes Relative: 10 % — ABNORMAL LOW (ref 12–46)
Lymphs Abs: 0.7 10*3/uL (ref 0.7–4.0)
MCH: 31.2 pg (ref 26.0–34.0)
MCHC: 35.1 g/dL (ref 30.0–36.0)
MCV: 89 fL (ref 78.0–100.0)
Monocytes Absolute: 0.1 10*3/uL (ref 0.1–1.0)
Monocytes Relative: 1 % — ABNORMAL LOW (ref 3–12)
Neutro Abs: 6.4 10*3/uL (ref 1.7–7.7)
Neutrophils Relative %: 89 % — ABNORMAL HIGH (ref 43–77)
Platelets: 135 10*3/uL — ABNORMAL LOW (ref 150–400)
RBC: 5.25 MIL/uL (ref 4.22–5.81)
RDW: 12.9 % (ref 11.5–15.5)
WBC: 7.2 10*3/uL (ref 4.0–10.5)

## 2014-05-21 LAB — COMPREHENSIVE METABOLIC PANEL
ALT: 18 U/L (ref 0–53)
AST: 14 U/L (ref 0–37)
Albumin: 3.9 g/dL (ref 3.5–5.2)
Alkaline Phosphatase: 59 U/L (ref 39–117)
Anion gap: 11 (ref 5–15)
BUN: 17 mg/dL (ref 6–23)
CO2: 23 mEq/L (ref 19–32)
Calcium: 9.1 mg/dL (ref 8.4–10.5)
Chloride: 106 mEq/L (ref 96–112)
Creatinine, Ser: 0.96 mg/dL (ref 0.50–1.35)
GFR calc Af Amer: >90 mL/min (ref >90)
GFR calc non Af Amer: >90 mL/min (ref >90)
Glucose, Bld: 108 mg/dL — ABNORMAL HIGH (ref 70–99)
Potassium: 4.4 mEq/L (ref 3.7–5.3)
Sodium: 140 mEq/L (ref 137–147)
Total Bilirubin: 1 mg/dL (ref 0.3–1.2)
Total Protein: 6.7 g/dL (ref 6.0–8.3)

## 2014-05-21 LAB — TSH: TSH: 0.87 u[IU]/mL (ref 0.350–4.500)

## 2014-05-21 LAB — I-STAT TROPONIN, ED: Troponin i, poc: 0 ng/mL (ref 0.00–0.08)

## 2014-05-21 LAB — TROPONIN I
Troponin I: <0.3 ng/mL (ref <0.30)
Troponin I: <0.3 ng/mL (ref <0.30)

## 2014-05-21 LAB — PRO B NATRIURETIC PEPTIDE: Pro B Natriuretic peptide (BNP): 250.9 pg/mL — ABNORMAL HIGH (ref 0–125)

## 2014-05-21 LAB — POCT HEMOGLOBIN-HEMACUE: Hemoglobin: 16.4 g/dL (ref 13.0–17.0)

## 2014-05-21 SURGERY — REPAIR STRABISMUS
Anesthesia: General | Site: Eye | Laterality: Right

## 2014-05-21 MED ORDER — EPHEDRINE SULFATE 50 MG/ML IJ SOLN
INTRAMUSCULAR | Status: DC | PRN
  Administered 2014-05-21: 10 mg via INTRAVENOUS

## 2014-05-21 MED ORDER — TOBRAMYCIN-DEXAMETHASONE 0.3-0.1 % OP OINT
TOPICAL_OINTMENT | OPHTHALMIC | Status: DC | PRN
  Administered 2014-05-21: 1 via OPHTHALMIC

## 2014-05-21 MED ORDER — FENTANYL CITRATE 0.05 MG/ML IJ SOLN: 50.0000 ug | INTRAMUSCULAR | Status: DC | PRN

## 2014-05-21 MED ORDER — MIDAZOLAM HCL 5 MG/5ML IJ SOLN
INTRAMUSCULAR | Status: DC | PRN
  Administered 2014-05-21: 2 mg via INTRAVENOUS

## 2014-05-21 MED ORDER — MIDAZOLAM HCL 2 MG/2ML IJ SOLN: 1.0000 mg | INTRAMUSCULAR | Status: DC | PRN

## 2014-05-21 MED ORDER — PROPOFOL 10 MG/ML IV BOLUS
INTRAVENOUS | Status: AC
  Filled 2014-05-21: qty 20

## 2014-05-21 MED ORDER — FENTANYL CITRATE 0.05 MG/ML IJ SOLN
INTRAMUSCULAR | Status: DC | PRN
  Administered 2014-05-21: 50 ug via INTRAVENOUS
  Administered 2014-05-21: 100 ug via INTRAVENOUS

## 2014-05-21 MED ORDER — LIDOCAINE HCL (CARDIAC) 20 MG/ML IV SOLN
INTRAVENOUS | Status: DC | PRN
  Administered 2014-05-21: 80 mg via INTRAVENOUS

## 2014-05-21 MED ORDER — TOBRAMYCIN-DEXAMETHASONE 0.3-0.1 % OP OINT
1.0000 | TOPICAL_OINTMENT | Freq: Two times a day (BID) | OPHTHALMIC | Status: DC
Start: 2014-05-21 — End: 2014-08-25

## 2014-05-21 MED ORDER — ACETAMINOPHEN 325 MG PO TABS
650.0000 mg | ORAL_TABLET | ORAL | Status: DC | PRN
  Administered 2014-05-21 – 2014-05-22 (×2): 650 mg via ORAL
  Filled 2014-05-21 (×2): qty 2

## 2014-05-21 MED ORDER — LACTATED RINGERS IV SOLN
INTRAVENOUS | Status: DC
  Administered 2014-05-21 (×2): via INTRAVENOUS

## 2014-05-21 MED ORDER — ONDANSETRON HCL 4 MG/2ML IJ SOLN
INTRAMUSCULAR | Status: DC | PRN
  Administered 2014-05-21: 4 mg via INTRAVENOUS

## 2014-05-21 MED ORDER — DEXTROSE 5 % IV SOLN
5.0000 mg/h | INTRAVENOUS | Status: DC
  Administered 2014-05-21: 5 mg/h via INTRAVENOUS

## 2014-05-21 MED ORDER — PROPOFOL 10 MG/ML IV BOLUS
INTRAVENOUS | Status: DC | PRN
  Administered 2014-05-21: 300 mg via INTRAVENOUS

## 2014-05-21 MED ORDER — DEXTROSE 5 % IV SOLN
5.0000 mg/h | INTRAVENOUS | Status: DC
  Administered 2014-05-22: 5 mg/h via INTRAVENOUS
  Filled 2014-05-21: qty 100

## 2014-05-21 MED ORDER — FENTANYL CITRATE 0.05 MG/ML IJ SOLN
INTRAMUSCULAR | Status: AC
Start: 2014-05-21 — End: 2014-05-21
  Filled 2014-05-21: qty 4

## 2014-05-21 MED ORDER — DILTIAZEM LOAD VIA INFUSION
20.0000 mg | Freq: Once | INTRAVENOUS | Status: AC
  Administered 2014-05-21: 20 mg via INTRAVENOUS
  Filled 2014-05-21: qty 20

## 2014-05-21 MED ORDER — HEPARIN BOLUS VIA INFUSION
5000.0000 [IU] | Freq: Once | INTRAVENOUS | Status: DC
  Filled 2014-05-21: qty 5000

## 2014-05-21 MED ORDER — OXYCODONE HCL 5 MG/5ML PO SOLN: 5.0000 mg | Freq: Once | ORAL | Status: DC | PRN

## 2014-05-21 MED ORDER — MIDAZOLAM HCL 2 MG/2ML IJ SOLN
INTRAMUSCULAR | Status: AC
  Filled 2014-05-21: qty 2

## 2014-05-21 MED ORDER — OXYCODONE-ACETAMINOPHEN 7.5-325 MG PO TABS: 1.0000 | ORAL_TABLET | ORAL | Status: DC | PRN

## 2014-05-21 MED ORDER — OXYCODONE HCL 5 MG PO TABS: 5.0000 mg | ORAL_TABLET | Freq: Once | ORAL | Status: DC | PRN

## 2014-05-21 MED ORDER — SODIUM CHLORIDE 0.9 % IV BOLUS (SEPSIS)
1000.0000 mL | Freq: Once | INTRAVENOUS | Status: AC
  Administered 2014-05-21: 1000 mL via INTRAVENOUS

## 2014-05-21 MED ORDER — KETOROLAC TROMETHAMINE 30 MG/ML IJ SOLN
INTRAMUSCULAR | Status: DC | PRN
  Administered 2014-05-21: 30 mg via INTRAVENOUS

## 2014-05-21 MED ORDER — ONDANSETRON HCL 4 MG/2ML IJ SOLN: 4.0000 mg | Freq: Four times a day (QID) | INTRAMUSCULAR | Status: DC | PRN

## 2014-05-21 MED ORDER — PANTOPRAZOLE SODIUM 40 MG PO TBEC
40.0000 mg | DELAYED_RELEASE_TABLET | Freq: Every day | ORAL | Status: DC
  Administered 2014-05-21 – 2014-05-22 (×2): 40 mg via ORAL
  Filled 2014-05-21 (×2): qty 1

## 2014-05-21 MED ORDER — SUCCINYLCHOLINE CHLORIDE 20 MG/ML IJ SOLN
INTRAMUSCULAR | Status: AC
  Filled 2014-05-21: qty 1

## 2014-05-21 MED ORDER — HYDROMORPHONE HCL 1 MG/ML IJ SOLN
0.2500 mg | INTRAMUSCULAR | Status: DC | PRN
  Administered 2014-05-21 (×2): 0.5 mg via INTRAVENOUS

## 2014-05-21 MED ORDER — BSS IO SOLN
INTRAOCULAR | Status: DC | PRN
  Administered 2014-05-21: 10 mL via INTRAOCULAR

## 2014-05-21 MED ORDER — ONDANSETRON HCL 4 MG/2ML IJ SOLN: 4.0000 mg | Freq: Once | INTRAMUSCULAR | Status: DC | PRN

## 2014-05-21 MED ORDER — HYDROMORPHONE HCL 1 MG/ML IJ SOLN
INTRAMUSCULAR | Status: AC
  Filled 2014-05-21: qty 1

## 2014-05-21 MED ORDER — DEXAMETHASONE SODIUM PHOSPHATE 4 MG/ML IJ SOLN
INTRAMUSCULAR | Status: DC | PRN
  Administered 2014-05-21: 10 mg via INTRAVENOUS

## 2014-05-21 MED ORDER — MORPHINE SULFATE 4 MG/ML IJ SOLN
4.0000 mg | Freq: Once | INTRAMUSCULAR | Status: AC
  Administered 2014-05-21: 4 mg via INTRAVENOUS
  Filled 2014-05-21: qty 1

## 2014-05-21 MED ORDER — HEPARIN (PORCINE) IN NACL 100-0.45 UNIT/ML-% IJ SOLN
1800.0000 [IU]/h | INTRAMUSCULAR | Status: DC
  Administered 2014-05-21: 1500 [IU]/h via INTRAVENOUS
  Filled 2014-05-21 (×3): qty 250

## 2014-05-21 SURGICAL SUPPLY — 30 items
APL SRG 3 HI ABS STRL LF PLS (MISCELLANEOUS) ×1
APPLICATOR COTTON TIP 6IN STRL (MISCELLANEOUS) ×8 IMPLANT
APPLICATOR DR MATTHEWS STRL (MISCELLANEOUS) ×2 IMPLANT
BANDAGE EYE OVAL (MISCELLANEOUS) IMPLANT
CAUTERY EYE LOW TEMP 1300F FIN (OPHTHALMIC RELATED) ×2 IMPLANT
COVER BACK TABLE 60X90IN (DRAPES) ×2 IMPLANT
COVER MAYO STAND STRL (DRAPES) ×2 IMPLANT
DRAPE SURG 17X23 STRL (DRAPES) ×4 IMPLANT
DRAPE U-SHAPE 76X120 STRL (DRAPES) ×2 IMPLANT
GLOVE BIO SURGEON STRL SZ 6.5 (GLOVE) ×2 IMPLANT
GLOVE BIOGEL M STRL SZ7.5 (GLOVE) ×4 IMPLANT
GOWN STRL REUS W/ TWL LRG LVL3 (GOWN DISPOSABLE) ×1 IMPLANT
GOWN STRL REUS W/TWL LRG LVL3 (GOWN DISPOSABLE) ×2
GOWN STRL REUS W/TWL XL LVL3 (GOWN DISPOSABLE) ×2 IMPLANT
NS IRRIG 1000ML POUR BTL (IV SOLUTION) ×2 IMPLANT
PACK BASIN DAY SURGERY FS (CUSTOM PROCEDURE TRAY) ×2 IMPLANT
SHEET MEDIUM DRAPE 40X70 STRL (DRAPES) IMPLANT
SLEEVE SCD COMPRESS KNEE MED (MISCELLANEOUS) ×2 IMPLANT
SPEAR EYE SURG WECK-CEL (MISCELLANEOUS) ×4 IMPLANT
STRIP CLOSURE SKIN 1/4X4 (GAUZE/BANDAGES/DRESSINGS) IMPLANT
SUT 6 0 SILK T G140 8DA (SUTURE) IMPLANT
SUT MERSILENE 6 0 S14 DA (SUTURE) IMPLANT
SUT PLAIN 6 0 TG1408 (SUTURE) ×1 IMPLANT
SUT SILK 4 0 C 3 735G (SUTURE) ×2 IMPLANT
SUT VICRYL 6 0 S 28 (SUTURE) ×2 IMPLANT
SUT VICRYL ABS 6-0 S29 18IN (SUTURE) IMPLANT
SYR TB 1ML LL NO SAFETY (SYRINGE) ×2 IMPLANT
SYRINGE 10CC LL (SYRINGE) ×2 IMPLANT
TOWEL OR 17X24 6PK STRL BLUE (TOWEL DISPOSABLE) ×2 IMPLANT
TRAY DSU PREP LF (CUSTOM PROCEDURE TRAY) ×2 IMPLANT

## 2014-05-21 NOTE — ED Notes (Signed)
Pt resting, alert, NAD, calm, interactive, resps e/u, using smart phone. VSS, HR afib 116. No dyspnea noted.

## 2014-05-21 NOTE — ED Notes (Signed)
Cardiology at bedside.

## 2014-05-21 NOTE — H&P (Signed)
Patient ID: Jorge Kelly MRN: [id number], DOB/AGE: 1956/03/07   Admit date: 05/21/2014   Primary Physician: Chancy Hurter, MD Primary Cardiologist: New ( seen by Johnsie Cancel in 2001)  Reason for consult: new onset afib with RVR  Pt. Profile:  Jorge Kelly is a 58 y.o. male with a history of GERD, esophageal stricture and GI bleed, malignant melanoma on his forehead s/p resection and no prior cardiac history who was admitted to [facility] today for an ophthalmologic procedure today and was found atrial fibrillation with RVR post op.  He saw Dr. Johnsie Cancel once in 2001 due to chest pain ( and possible elevated troponin ) which was later found to be of GI origin. He had stress testing that returned normal but no cardiac catheterization. He has no hx of atrial fibrillation or prior cardiac history. He denies a history of HTN, DM, CHF or CVA.  He does not feel his dysrhythmia and denies chest pain, SOB, lightheadedness, syncope.      Problem List  Past Medical History  Diagnosis Date  . Actinic keratosis 10/24/2009  . ALLERGIC RHINITIS 05/11/2007  . ELEVATED BLOOD PRESSURE WITHOUT DIAGNOSIS OF HYPERTENSION 11/12/2007  . GERD 05/11/2007  . MIGRAINE HEADACHE 05/11/2007  . SINUSITIS- ACUTE-NOS 05/10/2010  . TINEA CRURIS 10/24/2009    Past Surgical History  Procedure Laterality Date  . Ankle fracture surgery  2010    x2-left  . Upper gi endoscopy       Allergies  No Known Allergies   Home Medications  Prior to Admission medications   Medication Sig Start Date End Date Taking? Authorizing Provider  omeprazole (PRILOSEC) 20 MG capsule Take 20 mg by mouth daily.   Yes Historical Provider, MD  oxyCODONE-acetaminophen (PERCOCET) 7.5-325 MG per tablet Take 1 tablet by mouth every 4 (four) hours as needed for pain. 05/21/14   Derry Skill, MD  rizatriptan (MAXALT-MLT) 10 MG disintegrating tablet TAKE ONE TABLET BY MOUTH AS SOON AS HEADACHE STARTS. MAY REPEAT IN TWO HOURS IF NO RELIEF.  03/22/14   Lisabeth Pick, MD  tobramycin-dexamethasone (TOBRADEX) ophthalmic ointment Place 1 application into the right eye 2 (two) times daily. 05/21/14   Derry Skill, MD    Family History  History reviewed. No pertinent family history. No family status information on file.     Social History  History   Social History  . Marital Status: Married    Spouse Name: N/A    Number of Children: N/A  . Years of Education: N/A   Occupational History  . Not on file.   Social History Main Topics  . Smoking status: Never Smoker   . Smokeless tobacco: Never Used  . Alcohol Use: Yes  . Drug Use: No  . Sexual Activity: Not on file   Other Topics Concern  . Not on file   Social History Narrative  . No narrative on file     All other systems reviewed and are otherwise negative except as noted above.  Physical Exam  Blood pressure 140/95, pulse 112, temperature 97.9 F (36.6 C), temperature source Oral, resp. rate 15, height 6\' 2"  (1.88 m), weight 240 lb (108.863 kg), SpO2 93.00%.  General: Pleasant, NAD. Patch on eye Psych: Normal affect. Neuro: Alert and oriented X 3. Moves all extremities spontaneously. HEENT: Normal  Neck: Supple without bruits or JVD. Lungs:  Resp regular and unlabored, CTA. Heart: irreg irreg, tachy no s3, s4, or murmurs. Abdomen: Soft, non-tender, non-distended, BS + x  4.  Extremities: No clubbing, cyanosis or edema. DP/PT/Radials 2+ and equal bilaterally.  Labs  No results found for this basename: CKTOTAL, CKMB, TROPONINI,  in the last 72 hours Lab Results  Component Value Date   WBC 6.8 11/20/2013   HGB 16.4 05/21/2014   HCT 47.8 11/20/2013   MCV 91.9 11/20/2013   PLT 145.0* 11/20/2013   No results found for this basename: NA, K, CL, CO2, BUN, CREATININE, CALCIUM, LABALBU, PROT, BILITOT, ALKPHOS, ALT, AST, GLUCOSE,  in the last 168 hours Lab Results  Component Value Date   CHOL 162 11/20/2013   HDL 39.00* 11/20/2013   LDLCALC 102* 11/20/2013     TRIG 107.0 11/20/2013     Radiology/Studies  No results found.  ECG  afib with RVR HR 130  ASSESSMENT AND PLAN  Jorge Kelly is a 58 y.o. male with a history of GERD, esophageal stricture and GI bleed, malignant melanoma on his forehead s/p resection and no prior cardiac history who was admitted to [facility] today for an ophthalmologic procedure today and was found atrial fibrillation with RVR post op.  Afib with RVR -- Will admit overnight for observation -- Continue dilt gtt. He may convert spontaneously -- Will obtain ECHO  -- CHADS2 score of 0. No hx of HTN, DM, CHF, CVA  And history of GI bleeding making him high risk for anticoagulation -- Will check TSH and HgA1c for further risk stratification -- WIll start IV heparin and if he cardioverts on his own, this may be stopped. Otherwise he can go home on a NOAC with plans for DCCV in 3-4 weeks.     Judy Pimple, PA-C 05/21/2014, 2:31 PM  Pager [phone]   The patient was seen, examined and discussed with Lorretta Harp, PA-C and I agree with the above.   58 year old male with no prior cardiac history who underwent eye surgery under general anesthesia and developed atrial fibrillation with RVR post operatively. He is asymptomatic, denies any chest pain, SOB or palpitations, only complains of eye pain. He was started on iv Diltiazem drip with ventricular rate 100-120 BPM.  His CHDs-VASc is 0. We will start on Heparin drip as we are not sure if he was in a-fib prior to the surgery. The plan would be to discontinue if he cardioverts overnight. If he doesn't we would start him on short term anticoagulation and readmit for cardioversion.  His BP is borderline and might be related to pain, we will follow.  Echocardiogram is pending, none in the past.  Dorothy Spark 05/21/2014

## 2014-05-21 NOTE — ED Notes (Signed)
Pt was having surgical procedure on eye.  1 mg dilaudid was administered.  During the procedure is when they noticed pt had variable hr - EKG showed afib (new onset).  Rate 110-130's).  Pt rcvd 1300 ns en route.

## 2014-05-21 NOTE — ED Notes (Signed)
Called flow RE pt room.  Stated they are requesting bed.

## 2014-05-21 NOTE — Op Note (Signed)
05/21/2014  11:33 AM  PATIENT:  Jorge Kelly  58 y.o. male  PRE-OPERATIVE DIAGNOSIS:  Right superior oblique palsy  POST-OPERATIVE DIAGNOSIS:  same  PROCEDURE:   Inferior oblique muscle recession, right eye(s)  SURGEON:  Lorne Skeens.Annamaria Boots, M.D.   ANESTHESIA:   general  COMPLICATIONS:None  DESCRIPTION OF PROCEDURE: The patient was taken to the operating room where He was identified by me. General anesthesia was induced without difficulty after placement of appropriate monitors. The patient was prepped and draped in standard sterile fashion. A lid speculum was placed in the right eye.  Through an inferotemporal fornix incision through conjunctiva and Tenon's fascia, the right lateral rectus muscle was engaged on a Gass hook, which was used to draw a traction suture of 4-0 silk under the muscle. This was used to pull the eye up and in. Using 2 muscle hooks through the conjunctival incision for exposure, the right inferior oblique muscle was identified and engaged on oblique hook. It was drawn forward and cleared of its fascial attachments of the way to its insertion, which was secured with a fine curved hemostat. During the course of the isolation of the inferior oblique muscle a rent in Tenons fascia was encountered, with prolapse of a small amount of orbital fat.  The muscle was disinserted. Its cut and was secured with a double-armed 6-0 Vicryl suture, with a locking bite at each border of the muscle, 1 mm from the insertion. The right inferior rectus muscle was engaged on a series of muscle hooks. A Chevez was made on the sclera 3 mm posterior and 3 mm temporal to the temporal border of the inferior rectus insertion. This was used as the exit point for the pole sutures of the inferior oblique, which were passed in crossed swords fashion and tied securely. The rent in Tenons was then addressed.  Prolapsed orbital fat was clamped on a fine hemostat and removed with ophthalmic cautery.  The rent in  Tenons was oversewn with a single 6-0 Vicryl suture, so that no fat was exposed.  The conjunctival incision was closed with a single 6-0 plain gut suture.TobraDex ointment was placed in the right eye. The patient was awakened without difficulty and taken to the recovery room in stable condition.  Lorne Skeens. Jorge Kelly M.D.    PATIENT DISPOSITION:  PACU - hemodynamically stable.

## 2014-05-21 NOTE — Anesthesia Postprocedure Evaluation (Signed)
  Anesthesia Post-op Note  Patient: Jorge Kelly  Procedure(s) Performed: Procedure(s): REPAIR STRABISMUS RIGHT EYE (Right)  Patient Location: PACU  Anesthesia Type: General   Level of Consciousness: awake, alert  and oriented  Airway and Oxygen Therapy: Patient Spontanous Breathing  Post-op Pain: mild  Post-op Assessment: Post-op Vital signs reviewed, pt continues to be in A Fib as in the OR and will be transferred to [facility]  ED for further evaluation per request of Dr. Fransico Him.  Post-op Vital Signs: Reviewed  Last Vitals:  Filed Vitals:   05/21/14 1300  BP: 132/90  Pulse: 117  Temp:   Resp: 14    Complications: No apparent anesthesia complications

## 2014-05-21 NOTE — H&P (View-Only) (Signed)
  Date of examination:  05-04-14  Indication for surgery: to straighten the eyes and relieve double vision  Pertinent past medical history:  Past Medical History  Diagnosis Date  . Actinic keratosis 10/24/2009  . ALLERGIC RHINITIS 05/11/2007  . ELEVATED BLOOD PRESSURE WITHOUT DIAGNOSIS OF HYPERTENSION 11/12/2007  . GERD 05/11/2007  . MIGRAINE HEADACHE 05/11/2007  . SINUSITIS- ACUTE-NOS 05/10/2010  . TINEA CRURIS 10/24/2009    Pertinent ocular history:  Diplopia especially bothersome x 9 months but patient thinks it may have been present for many years  Pertinent family history: No family history on file.  General:  Healthy appearing patient in no distress.    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

## 2014-05-21 NOTE — ED Notes (Signed)
Dr. Nelson with cardiology at bedside.  

## 2014-05-21 NOTE — Progress Notes (Signed)
During Right eye surgery the pt developed A Fib with a rapid ventricular response in the 120's.  He tolerated the procedure well but continued with this rhythm in PACU.. Dr. Fransico Him was consulted and her decision was to admit him to the ED at [facility] for further evaluation.  The patient's wife was informed.

## 2014-05-21 NOTE — Anesthesia Preprocedure Evaluation (Signed)

## 2014-05-21 NOTE — ED Notes (Signed)
Attempted report 

## 2014-05-21 NOTE — Interval H&P Note (Signed)
History and Physical Interval Note:  05/21/2014 9:47 AM  Jorge Kelly  has presented today for surgery, with the diagnosis of SUPERIOR OBLIQUE PALSY RIGHT EYE  The various methods of treatment have been discussed with the patient and family. After consideration of risks, benefits and other options for treatment, the patient has consented to  Procedure(s): REPAIR STRABISMUS RIGHT EYE (Right) as a surgical intervention .  The patient's history has been reviewed, patient examined, no change in status, stable for surgery.  I have reviewed the patient's chart and labs.  Questions were answered to the patient's satisfaction.     Derry Skill

## 2014-05-21 NOTE — Transfer of Care (Signed)
Immediate Anesthesia Transfer of Care Note  Patient: Jorge Kelly  Procedure(s) Performed: Procedure(s): REPAIR STRABISMUS RIGHT EYE (Right)  Patient Location: PACU  Anesthesia Type:General  Level of Consciousness: awake, alert  and patient cooperative  Airway & Oxygen Therapy: Patient Spontanous Breathing and Patient connected to face mask oxygen  Post-op Assessment: Report given to PACU RN, Post -op Vital signs reviewed and stable and Patient moving all extremities  Post vital signs: Reviewed and stable  Complications: No apparent anesthesia complications

## 2014-05-21 NOTE — ED Provider Notes (Signed)
I saw and evaluated the patient, reviewed the resident's note and I agree with the findings and plan.   EKG Interpretation   Date/Time:  Friday May 21 2014 13:49:36 EDT Ventricular Rate:  130 PR Interval:    QRS Duration: 91 QT Interval:  343 QTC Calculation: 504 R Axis:   58 Text Interpretation:  Atrial fibrillation Borderline T wave abnormalities  Borderline prolonged QT interval No significant change since last tracing  earlier in the day  Reconfirmed by Jerline Linzy  MD, Deanna Wiater ([id number]) on 05/21/2014  3:05:48 PM      Jorge Kelly is a 58 y.o. male here with new onset afib. Otherwise healthy, had general anesthesia for R eye surgery today. Was noted to be in afib with RVR. Sent here from PACU. Patient feels well. Denies chest pain or shortness of breath or palpitations. On exam, HR 140s, irregular. Lungs clear. Abdomen soft. R eye shield in place. Cardizem bolus given and drip started for rate control. Cardiology consulted and will admit. CHAD2 score is 0, will hold anticoagulation.   CRITICAL CARE Performed by: Darl Householder, Madoline Bhatt   Total critical care time: 30 min   Critical care time was exclusive of separately billable procedures and treating other patients.  Critical care was necessary to treat or prevent imminent or life-threatening deterioration.  Critical care was time spent personally by me on the following activities: development of treatment plan with patient and/or surrogate as well as nursing, discussions with consultants, evaluation of patient's response to treatment, examination of patient, obtaining history from patient or surrogate, ordering and performing treatments and interventions, ordering and review of laboratory studies, ordering and review of radiographic studies, pulse oximetry and re-evaluation of patient's condition.    Wandra Arthurs, MD 05/21/14 [phone]

## 2014-05-21 NOTE — ED Provider Notes (Signed)
CSN: [id number]     Arrival date & time 05/21/14  1345 History   First MD Initiated Contact with Patient 05/21/14 1350     Chief Complaint  Patient presents with  . Atrial Fibrillation     (Consider location/radiation/quality/duration/timing/severity/associated sxs/prior Treatment) Patient is a 58 y.o. male presenting with palpitations.  Palpitations Palpitations quality:  Fast Onset quality:  Sudden Duration:  2 hours Timing:  Constant Progression:  Unchanged Chronicity:  New Context comment:  Surgery Relieved by:  None tried Worsened by:  Nothing tried Ineffective treatments:  None tried Associated symptoms: no chest pain, no nausea, no shortness of breath and no vomiting   Risk factors: stress   Risk factors: no hx of atrial fibrillation, no hx of DVT and no hx of PE     Past Medical History  Diagnosis Date  . Actinic keratosis 10/24/2009  . ALLERGIC RHINITIS 05/11/2007  . ELEVATED BLOOD PRESSURE WITHOUT DIAGNOSIS OF HYPERTENSION 11/12/2007  . GERD 05/11/2007  . MIGRAINE HEADACHE 05/11/2007  . SINUSITIS- ACUTE-NOS 05/10/2010  . TINEA CRURIS 10/24/2009   Past Surgical History  Procedure Laterality Date  . Ankle fracture surgery  2010    x2-left  . Upper gi endoscopy     History reviewed. No pertinent family history. History  Substance Use Topics  . Smoking status: Never Smoker   . Smokeless tobacco: Never Used  . Alcohol Use: Yes    Review of Systems  Constitutional: Negative for fever and chills.  HENT: Negative for congestion and sore throat.   Eyes: Negative for visual disturbance.  Respiratory: Negative for shortness of breath and wheezing.   Cardiovascular: Positive for palpitations. Negative for chest pain.  Gastrointestinal: Negative for nausea, vomiting, abdominal pain, diarrhea and constipation.  Genitourinary: Negative for dysuria and difficulty urinating.  Musculoskeletal: Negative for arthralgias and myalgias.  Skin: Negative for wound.   Neurological: Negative for syncope and headaches.  Psychiatric/Behavioral: Negative for behavioral problems.  All other systems reviewed and are negative.     Allergies  Review of patient's allergies indicates no known allergies.  Home Medications   Prior to Admission medications   Medication Sig Start Date End Date Taking? Authorizing Provider  omeprazole (PRILOSEC) 20 MG capsule Take 20 mg by mouth daily.   Yes Historical Provider, MD  rizatriptan (MAXALT-MLT) 10 MG disintegrating tablet Take 10 mg by mouth as needed for migraine. May repeat in 2 hours if needed   Yes Historical Provider, MD  oxyCODONE-acetaminophen (PERCOCET) 7.5-325 MG per tablet Take 1 tablet by mouth every 4 (four) hours as needed for pain. 05/21/14   Derry Skill, MD  tobramycin-dexamethasone [facility]) ophthalmic ointment Place 1 application into the right eye 2 (two) times daily. 05/21/14   Derry Skill, MD   BP 125/95  Pulse 112  Temp(Src) 98.5 F (36.9 C) (Oral)  Resp 18  Ht 6\' 2"  (1.88 m)  Wt 240 lb (108.863 kg)  BMI 30.80 kg/m2  SpO2 91% Physical Exam  Vitals reviewed. Constitutional: He is oriented to person, place, and time. He appears well-developed and well-nourished.  HENT:  Head: Normocephalic and atraumatic.  Eyes: EOM are normal.  Neck: Normal range of motion.  Cardiovascular: Normal heart sounds.  An irregularly irregular rhythm present. Tachycardia present.   No murmur heard. Pulmonary/Chest: Effort normal and breath sounds normal. No respiratory distress.  Abdominal: Soft. There is no tenderness.  Musculoskeletal: He exhibits no edema.  Neurological: He is alert and oriented to person, place, and time.  Skin:  No rash noted. He is not diaphoretic.    ED Course  Procedures (including critical care time) Labs Review Labs Reviewed  CBC WITH DIFFERENTIAL - Abnormal; Notable for the following:    Platelets 135 (*)    Neutrophils Relative % 89 (*)    Lymphocytes Relative 10  (*)    Monocytes Relative 1 (*)    All other components within normal limits  COMPREHENSIVE METABOLIC PANEL - Abnormal; Notable for the following:    Glucose, Bld 108 (*)    All other components within normal limits  HEPARIN LEVEL (UNFRACTIONATED)  POCT HEMOGLOBIN-HEMACUE  I-STAT TROPOININ, ED    Imaging Review No results found.   EKG Interpretation   Date/Time:  Friday May 21 2014 13:49:36 EDT Ventricular Rate:  130 PR Interval:    QRS Duration: 91 QT Interval:  343 QTC Calculation: 504 R Axis:   58 Text Interpretation:  Atrial fibrillation Borderline T wave abnormalities  Borderline prolonged QT interval No significant change since last tracing  earlier in the day  Reconfirmed by YAO  MD, DAVID ([id number]) on 05/21/2014  3:05:48 PM      MDM   Final diagnoses:  Atrial fibrillation with RVR    Patient is a 58 year old male that was having eye surgery done today when it was noticed that he had a heart rate around 130. EKG was performed which showed atrial fibrillation with RVR. Patient has no history of atrial fibrillation. Patient denies chest pain or shortness of breath. On exam patient is tachycardic and irregularly irregular. Patient was started on a diltiazem drip with bolus. Patient's heart rate responded appropriately. Cardiology was consulted and evaluated the patient and will admit him at this time. Patient's CHADS2 score is 0 so we will not start and coagulation at this time.     Renne Musca, MD 05/21/14 [phone]

## 2014-05-21 NOTE — Anesthesia Procedure Notes (Signed)
Procedure Name: LMA Insertion Date/Time: 05/21/2014 10:14 AM Performed by: Baxter Flattery Pre-anesthesia Checklist: Patient identified, Emergency Drugs available, Suction available and Patient being monitored Patient Re-evaluated:Patient Re-evaluated prior to inductionOxygen Delivery Method: Circle System Utilized Preoxygenation: Pre-oxygenation with 100% oxygen Intubation Type: IV induction Ventilation: Mask ventilation without difficulty LMA: LMA flexible inserted LMA Size: 5.0 Number of attempts: 1 Airway Equipment and Method: bite block Placement Confirmation: positive ETCO2 and breath sounds checked- equal and bilateral Tube secured with: Tape Dental Injury: Teeth and Oropharynx as per pre-operative assessment

## 2014-05-21 NOTE — Progress Notes (Signed)
ANTICOAGULATION CONSULT NOTE - Initial Consult  Pharmacy Consult for heparin Indication: atrial fibrillation  No Known Allergies  Patient Measurements: Height: 6\' 2"  (188 cm) Weight: 240 lb (108.863 kg) IBW/kg (Calculated) : 82.2 Heparin Dosing Weight: 104.6  Vital Signs: Temp: 97.9 F (36.6 C) (10/16 1351) Temp Source: Oral (10/16 1351) BP: 139/96 mmHg (10/16 1500) Pulse Rate: 114 (10/16 1500)  Labs:  Recent Labs  05/21/14 0902 05/21/14 1414  HGB 16.4 16.4  HCT  --  46.7  PLT  --  135*  CREATININE  --  0.96    Estimated Creatinine Clearance: 112.9 ml/min (by C-G formula based on Cr of 0.96).   Medical History: Past Medical History  Diagnosis Date  . Actinic keratosis 10/24/2009  . ALLERGIC RHINITIS 05/11/2007  . ELEVATED BLOOD PRESSURE WITHOUT DIAGNOSIS OF HYPERTENSION 11/12/2007  . GERD 05/11/2007  . MIGRAINE HEADACHE 05/11/2007  . SINUSITIS- ACUTE-NOS 05/10/2010  . TINEA CRURIS 10/24/2009    Medications:  No PTA anticoagulation  Assessment: 58 y/o M with new onset atrial fibrillation w/ RVR discovered during ocular procedure. HR 110-130's and EKG showed afib. BP 139/86, CrCl 112.9 mL/min, plts 135.  Heparin anticoagulation to be started for new onset a fib. Per MD, if pt cardioverts on his own, IV heparin drip will be stopped, otherwise patient can be sent home with oral anticoagulation with DCCV in 3-4 weeks.  Goal of Therapy:  Heparin level 0.3 - 0.7. Monitor platelets by anticoagulation protocol: yes  Plan:  Heparin gtt 1500 units/hr - no bolus due to recent procedure F/u CBC, 8 hr heparin level, daily heparin level, platelets F/u with plans for oral anticoagulation  Carl Best 05/21/2014,3:22 PM  I agree with the assessment and plan.  Salome Arnt, PharmD, BCPS Pager # [phone] 05/21/2014 3:39 PM

## 2014-05-22 ENCOUNTER — Other Ambulatory Visit: Payer: Self-pay | Admitting: Cardiology

## 2014-05-22 ENCOUNTER — Encounter (HOSPITAL_COMMUNITY): Payer: Self-pay | Admitting: Physician Assistant

## 2014-05-22 DIAGNOSIS — I359 Nonrheumatic aortic valve disorder, unspecified: Secondary | ICD-10-CM

## 2014-05-22 DIAGNOSIS — H4911 Fourth [trochlear] nerve palsy, right eye: Secondary | ICD-10-CM | POA: Diagnosis present

## 2014-05-22 DIAGNOSIS — H4910 Fourth [trochlear] nerve palsy, unspecified eye: Secondary | ICD-10-CM

## 2014-05-22 DIAGNOSIS — Z85820 Personal history of malignant melanoma of skin: Secondary | ICD-10-CM | POA: Diagnosis not present

## 2014-05-22 DIAGNOSIS — I4891 Unspecified atrial fibrillation: Secondary | ICD-10-CM | POA: Diagnosis present

## 2014-05-22 LAB — PROTIME-INR
INR: 1.12 (ref 0.00–1.49)
Prothrombin Time: 14.5 seconds (ref 11.6–15.2)

## 2014-05-22 LAB — BASIC METABOLIC PANEL
Anion gap: 13 (ref 5–15)
BUN: 20 mg/dL (ref 6–23)
CO2: 20 mEq/L (ref 19–32)
Calcium: 8.8 mg/dL (ref 8.4–10.5)
Chloride: 105 mEq/L (ref 96–112)
Creatinine, Ser: 0.9 mg/dL (ref 0.50–1.35)
GFR calc Af Amer: >90 mL/min (ref >90)
GFR calc non Af Amer: >90 mL/min (ref >90)
Glucose, Bld: 148 mg/dL — ABNORMAL HIGH (ref 70–99)
Potassium: 4.1 mEq/L (ref 3.7–5.3)
Sodium: 138 mEq/L (ref 137–147)

## 2014-05-22 LAB — LIPID PANEL
Cholesterol: 171 mg/dL (ref 0–200)
HDL: 46 mg/dL (ref >39)
LDL Cholesterol: 102 mg/dL — ABNORMAL HIGH (ref 0–99)
Total CHOL/HDL Ratio: 3.7 RATIO
Triglycerides: 117 mg/dL (ref <150)
VLDL: 23 mg/dL (ref 0–40)

## 2014-05-22 LAB — CBC
HCT: 42.9 % (ref 39.0–52.0)
Hemoglobin: 14.9 g/dL (ref 13.0–17.0)
MCH: 30.8 pg (ref 26.0–34.0)
MCHC: 34.7 g/dL (ref 30.0–36.0)
MCV: 88.6 fL (ref 78.0–100.0)
Platelets: 159 10*3/uL (ref 150–400)
RBC: 4.84 MIL/uL (ref 4.22–5.81)
RDW: 12.7 % (ref 11.5–15.5)
WBC: 11.2 10*3/uL — ABNORMAL HIGH (ref 4.0–10.5)

## 2014-05-22 LAB — HEMOGLOBIN A1C
Hgb A1c MFr Bld: 5.4 % (ref <5.7)
Mean Plasma Glucose: 108 mg/dL (ref <117)

## 2014-05-22 LAB — HEPARIN LEVEL (UNFRACTIONATED)
Heparin Unfractionated: <0.1 IU/mL — ABNORMAL LOW (ref 0.30–0.70)
Heparin Unfractionated: 0.34 IU/mL (ref 0.30–0.70)

## 2014-05-22 LAB — TROPONIN I: Troponin I: <0.3 ng/mL (ref <0.30)

## 2014-05-22 MED ORDER — RIVAROXABAN 20 MG PO TABS
20.0000 mg | ORAL_TABLET | Freq: Every day | ORAL | Status: DC
  Administered 2014-05-22: 20 mg via ORAL
  Filled 2014-05-22: qty 1

## 2014-05-22 MED ORDER — RIVAROXABAN 20 MG PO TABS: 20.0000 mg | ORAL_TABLET | Freq: Every day | ORAL | Status: DC

## 2014-05-22 MED ORDER — DILTIAZEM HCL ER COATED BEADS 120 MG PO CP24: 120.0000 mg | ORAL_CAPSULE | Freq: Every day | ORAL | Status: DC

## 2014-05-22 MED ORDER — DILTIAZEM HCL ER COATED BEADS 120 MG PO CP24
120.0000 mg | ORAL_CAPSULE | Freq: Every day | ORAL | Status: DC
  Administered 2014-05-22: 120 mg via ORAL
  Filled 2014-05-22: qty 1

## 2014-05-22 NOTE — Progress Notes (Signed)
UR completed 

## 2014-05-22 NOTE — Discharge Instructions (Addendum)
Diet: Clear liquids, advance to soft foods then regular diet as tolerated.  Pain control:   1)  Ibuprofen 600 mg by mouth every 6-8 hours as needed for pain  2)  Percocet 7.5/325 one or two by mouth as needed every 4-6 hours as needed  for pain that is not resolved by ibuprofen  Eye medications:  Tobradex eye ointment 1/2 inch in right eye twice a day for one week  Activity: No swimming for 1 week.  It is OK to let water run over the face and eyes while showering or taking a bath, even during the first week.  No other restriction on exercise or activity.  Call Dr. Janee Morn office [phone] with any problems or concerns.    Post Anesthesia Home Care Instructions  Activity: Get plenty of rest for the remainder of the day. A responsible adult should stay with you for 24 hours following the procedure.  For the next 24 hours, DO NOT: -Drive a car -Paediatric nurse -Drink alcoholic beverages -Take any medication unless instructed by your physician -Make any legal decisions or sign important papers.  Meals: Start with liquid foods such as gelatin or soup. Progress to regular foods as tolerated. Avoid greasy, spicy, heavy foods. If nausea and/or vomiting occur, drink only clear liquids until the nausea and/or vomiting subsides. Call your physician if vomiting continues.  Special Instructions/Symptoms: Your throat may feel dry or sore from the anesthesia or the breathing tube placed in your throat during surgery. If this causes discomfort, gargle with warm salt water. The discomfort should disappear within 24 hours.      Information on my medicine - XARELTO (Rivaroxaban)  This medication education was reviewed with me or my healthcare representative as part of my discharge preparation.  The pharmacist that spoke with me during my hospital stay was:  Lavenia Atlas, [facility]  Why was Xarelto prescribed for you? Xarelto was prescribed for you to reduce the risk of a blood clot  forming that can cause a stroke if you have a medical condition called atrial fibrillation (a type of irregular heartbeat).  What do you need to know about xarelto ? Take your Xarelto ONCE DAILY at the same time every day with your evening meal. If you have difficulty swallowing the tablet whole, you may crush it and mix in applesauce just prior to taking your dose.  Take Xarelto exactly as prescribed by your doctor and DO NOT stop taking Xarelto without talking to the doctor who prescribed the medication.  Stopping without other stroke prevention medication to take the place of Xarelto may increase your risk of developing a clot that causes a stroke.  Refill your prescription before you run out.  After discharge, you should have regular check-up appointments with your healthcare provider that is prescribing your Xarelto.  In the future your dose may need to be changed if your kidney function or weight changes by a significant amount.  What do you do if you miss a dose? If you are taking Xarelto ONCE DAILY and you miss a dose, take it as soon as you remember on the same day then continue your regularly scheduled once daily regimen the next day. Do not take two doses of Xarelto at the same time or on the same day.   Important Safety Information A possible side effect of Xarelto is bleeding. You should call your healthcare provider right away if you experience any of the following:   Bleeding from an injury or your  nose that does not stop.   Unusual colored urine (red or dark brown) or unusual colored stools (red or black).   Unusual bruising for unknown reasons.   A serious fall or if you hit your head (even if there is no bleeding).  Some medicines may interact with Xarelto and might increase your risk of bleeding while on Xarelto. To help avoid this, consult your healthcare provider or pharmacist prior to using any new prescription or non-prescription medications, including herbals,  vitamins, non-steroidal anti-inflammatory drugs (NSAIDs) and supplements.  This website has more information on Xarelto: [email or web address]   Do not miss a dose of Xarelto.

## 2014-05-22 NOTE — Progress Notes (Signed)
Echocardiogram 2D Echocardiogram has been performed.  Jorge Kelly 05/22/2014, 12:45 PM

## 2014-05-22 NOTE — Progress Notes (Signed)
ANTICOAGULATION CONSULT NOTE Pharmacy Consult for heparin Indication: atrial fibrillation  No Known Allergies  Patient Measurements: Height: 6\' 2"  (188 cm) Weight: 240 lb (108.863 kg) IBW/kg (Calculated) : 82.2 Heparin Dosing Weight: 104.6  Vital Signs: Temp: 97.5 F (36.4 C) (10/16 2006) Temp Source: Oral (10/16 2006) BP: 102/62 mmHg (10/16 2250) Pulse Rate: 105 (10/16 2250)  Labs:  Recent Labs  05/21/14 0902 05/21/14 1414 05/21/14 1720 05/21/14 2301 05/21/14 2335  HGB 16.4 16.4  --   --   --   HCT  --  46.7  --   --   --   PLT  --  135*  --   --   --   HEPARINUNFRC  --   --   --   --  <0.10*  CREATININE  --  0.96  --   --   --   TROPONINI  --   --  <0.30 <0.30  --     Estimated Creatinine Clearance: 112.9 ml/min (by C-G formula based on Cr of 0.96).  Assessment: 58 y/o Male with new onset atrial fibrillation for heparin  Goal of Therapy:  Heparin level 0.3 - 0.7. Monitor platelets by anticoagulation protocol: yes  Plan:  Increase Heparin 1800 units/hr Check heparin level in 8 hours.  Phillis Knack, PharmD, BCPS

## 2014-05-22 NOTE — Discharge Summary (Signed)
Discharge Summary   Patient ID: Jorge Kelly, MRN: [id number], DOB/AGE: 1956/04/02 58 y.o.  Admit date: 05/21/2014 Discharge date: 05/22/2014   Primary Care Physician:  Chancy Hurter   Primary Cardiologist:  Dr. Ena Dawley   Reason for Admission:  Atrial Fibrillation with RVR   Primary Discharge Diagnoses:  Principal Problem:   Atrial fibrillation with RVR Active Problems:   Superior oblique palsy     Wt Readings from Last 3 Encounters:  05/22/14 242 lb 1.6 oz (109.816 kg)  05/22/14 242 lb 1.6 oz (109.816 kg)  01/04/14 243 lb (110.224 kg)    Secondary Discharge Diagnoses:   Past Medical History  Diagnosis Date  . Actinic keratosis 10/24/2009  . ALLERGIC RHINITIS 05/11/2007  . ELEVATED BLOOD PRESSURE WITHOUT DIAGNOSIS OF HYPERTENSION 11/12/2007  . GERD 05/11/2007  . MIGRAINE HEADACHE 05/11/2007  . SINUSITIS- ACUTE-NOS 05/10/2010  . TINEA CRURIS 10/24/2009  . Atrial fibrillation 05/2014      Allergies:   No Known Allergies    Procedures Performed This Admission:       Hospital Course:  Jorge Kelly is a 58 y.o. male with a history of GERD, esophageal stricture and GI bleed, malignant melanoma on his forehead s/p resection and no prior cardiac history who was admitted to [facility] on 05/21/2014 for an ophthalmologic procedure and found to be in atrial fibrillation with RVR post op.  He was placed on IV Heparin and IV Diltiazem.  Duration of AFib was not certain.  It was felt that if he did not convert to NSR on his own, that he could go home on a NOAC with plans for DCCV in the next 3-4 weeks.  CHADS2-VASc=0 so he would not need long term anticoagulation.  HR was better controlled on Diltiazem. Echocardiogram was performed and revealed normal systolic function with EF at 50-55%. He was seen by Dr. Minus Breeding this AM and felt to be stable for DC to home.  He was transitioned to oral Diltiazem.  Heparin was transitioned to Xarelto.  He will FU with ophthalmology as  planned. F/U will be arranged with Dr. [name] or Richardson Dopp in 3 weeks.      Discharge Vitals:   Blood pressure 120/80, pulse 91, temperature 98.1 F (36.7 C), temperature source Oral, resp. rate 18, height 6\' 2"  (1.88 m), weight 242 lb 1.6 oz (109.816 kg), SpO2 96.00%.   Labs:   Recent Labs  05/21/14 0902 05/21/14 1414 05/22/14 0330  WBC  --  7.2 11.2*  HGB 16.4 16.4 14.9  HCT  --  46.7 42.9  MCV  --  89.0 88.6  PLT  --  135* 159     Recent Labs  05/21/14 1414 05/22/14 0330  NA 140 138  K 4.4 4.1  CL 106 105  CO2 23 20  BUN 17 20  CREATININE 0.96 0.90  CALCIUM 9.1 8.8  PROT 6.7  --   BILITOT 1.0  --   ALKPHOS 59  --   ALT 18  --   AST 14  --      Recent Labs  05/21/14 1720 05/21/14 2301 05/22/14 0330  TROPONINI <0.30 <0.30 <0.30    Lab Results  Component Value Date   CHOL 171 05/22/2014   HDL 46 05/22/2014   LDLCALC 102* 05/22/2014   TRIG 117 05/22/2014    Lab Results  Component Value Date   TSH 0.870 05/21/2014     Recent Labs  05/22/14 0330  INR 1.12  Diagnostic Procedures and Studies:  No results found.   2D Echocardiogram 05/22/14 Study Conclusions  - Left ventricle: The cavity size was normal. There was mild concentric hypertrophy. Systolic function was normal. The estimated ejection fraction was in the range of 50% to 55%. Wall motion was normal; there were no regional wall motion abnormalities. The study was not technically sufficient to allow evaluation of LV diastolic dysfunction due to atrial fibrillation. - Aortic valve: Structurally normal valve. There was mild regurgitation. - Ascending aorta: The ascending aorta was normal in size. - Mitral valve: Structurally normal valve. There was mild regurgitation. - Left atrium: The atrium was moderately dilated. - Right ventricle: The cavity size was mildly dilated. Wall thickness was normal. Systolic function was normal. - Right atrium: The atrium was mildly  dilated. - Tricuspid valve: There was mild regurgitation. - Pulmonic valve: There was no regurgitation. - Pulmonary arteries: Systolic pressure was within the normal range. - Inferior vena cava: The vessel was dilated. The respirophasic diameter changes were blunted (< 50%), consistent with elevated central venous pressure. - Pericardium, extracardiac: The pericardium was normal in appearance.  Impressions:  - Normal left ventricular size and function with mild concentric left ventricular hypertrophy. Moderately dilated left atrium, mildly dilated right atrium and ventricle. Normal right ventricular systolic function. Mild aortic, mitral and tricuspid regurgitation. RVSP normal.    Disposition:   Pt is being discharged home today in good condition.  Follow-up Plans & Appointments      Follow-up Information   Follow up with Derry Skill, MD On 05/31/2014. (8:00 am)    Specialty:  Ophthalmology   Contact information:   [place] [address] [phone]       Follow up with Dorothy Spark, MD In 3 weeks. (The office will call to arrange follow up with Dr. [name] or Richardson Dopp, PA-C in the next 3 weeks.)    Specialty:  Cardiology   Contact information:   [place] [address] [phone]       Discharge Medications    Medication List         diltiazem 120 MG 24 hr capsule  Commonly known as:  CARDIZEM CD  Take 1 capsule (120 mg total) by mouth daily.     omeprazole 20 MG capsule  Commonly known as:  PRILOSEC  Take 20 mg by mouth daily.     oxyCODONE-acetaminophen 7.5-325 MG per tablet  Commonly known as:  PERCOCET  Take 1 tablet by mouth every 4 (four) hours as needed for pain.     rivaroxaban 20 MG Tabs tablet  Commonly known as:  XARELTO  Take 1 tablet (20 mg total) by mouth daily with supper.     rizatriptan 10 MG disintegrating tablet  Commonly known as:  MAXALT-MLT  Take 10 mg by mouth as  needed for migraine. May repeat in 2 hours if needed     tobramycin-dexamethasone ophthalmic ointment  Commonly known as:  TOBRADEX  Place 1 application into the right eye 2 (two) times daily.         Outstanding Labs/Studies  1. None   Duration of Discharge Encounter: Greater than 30 minutes including physician and PA time.  Signed, Richardson Dopp, PA-C   05/22/2014 11:16 AM    Patient seen and examined.  Plan as discussed in my rounding note for today and outlined above. Minus Breeding  05/22/2014  4:37 PM

## 2014-05-22 NOTE — Progress Notes (Signed)
Spoke with on call for cardiology at this time pertaining to patients recent vital signs and heart rhythm.  Patient borderline within orders to increase Cardizem drip.  Patient stated he feels good at this time.  Per on call for cardiology do not increase Cardizem drip at this time.

## 2014-05-22 NOTE — Progress Notes (Signed)
[place] for heparin>>Xarelto  Indication: atrial fibrillation  No Known Allergies  Patient Measurements: Height: 6\' 2"  (188 cm) Weight: 242 lb 1.6 oz (109.816 kg) IBW/kg (Calculated) : 82.2 Heparin Dosing Weight: 104.6  Vital Signs: Temp: 98.1 F (36.7 C) (10/17 0700) Temp Source: Oral (10/17 0700) BP: 120/80 mmHg (10/17 0700) Pulse Rate: 91 (10/17 0700)  Labs:  Recent Labs  05/21/14 1414 05/21/14 1720 05/21/14 2301 05/21/14 2335 05/22/14 0330  HGB 16.4  --   --   --  14.9  HCT 46.7  --   --   --  42.9  PLT 135*  --   --   --  159  LABPROT  --   --   --   --  14.5  INR  --   --   --   --  1.12  HEPARINUNFRC  --   --   --  <0.10*  --   CREATININE 0.96  --   --   --  0.90  TROPONINI  --  <0.30 <0.30  --  <0.30    Estimated Creatinine Clearance: 120.8 ml/min (by C-G formula based on Cr of 0.9).  Assessment: 58 y/o Male with new onset atrial fibrillation currently on heparin. Pharmacy consulted to transition to Xarelto for discharge. H/H and Plt wnl. No s/s of bleeding. CrCl ~ 120 mL/min. No drug interactions noted. Patient has been education on Xarelto.  Goal of Therapy:  Stroke prevention  Monitor platelets by anticoagulation protocol: yes  Plan:  Stop Heparin. Start Xarelto 20 mg daily  Monitor for s/s of bleeding   Albertina Parr, PharmD.  Clinical Pharmacist Pager [phone]    Phillis Knack, PharmD, BCPS

## 2014-05-22 NOTE — Progress Notes (Signed)
SUBJECTIVE:  No pain.  No SOB.  No palpitations   PHYSICAL EXAM Filed Vitals:   05/21/14 2250 05/22/14 0254 05/22/14 0500 05/22/14 0700  BP: 102/62 111/64 113/70 120/80  Pulse:   89 91  Temp:   98.3 F (36.8 C) 98.1 F (36.7 C)  TempSrc:   Oral Oral  Resp:   18 18  Height:      Weight:   242 lb 1.6 oz (109.816 kg)   SpO2:   95% 96%   General:  No distress Lungs:  Clear Heart:  Irregular Abdomen:  Positive bowel sounds, no rebound no guarding Extremities:  No edema   LABS: Lab Results  Component Value Date   TROPONINI <0.30 05/22/2014   Results for orders placed during the hospital encounter of 05/21/14 (from the past 24 hour(s))  CBC WITH DIFFERENTIAL     Status: Abnormal   Collection Time    05/21/14  2:14 PM      Result Value Ref Range   WBC 7.2  4.0 - 10.5 K/uL   RBC 5.25  4.22 - 5.81 MIL/uL   Hemoglobin 16.4  13.0 - 17.0 g/dL   HCT 46.7  39.0 - 52.0 %   MCV 89.0  78.0 - 100.0 fL   MCH 31.2  26.0 - 34.0 pg   MCHC 35.1  30.0 - 36.0 g/dL   RDW 12.9  11.5 - 15.5 %   Platelets 135 (*) 150 - 400 K/uL   Neutrophils Relative % 89 (*) 43 - 77 %   Neutro Abs 6.4  1.7 - 7.7 K/uL   Lymphocytes Relative 10 (*) 12 - 46 %   Lymphs Abs 0.7  0.7 - 4.0 K/uL   Monocytes Relative 1 (*) 3 - 12 %   Monocytes Absolute 0.1  0.1 - 1.0 K/uL   Eosinophils Relative 0  0 - 5 %   Eosinophils Absolute 0.0  0.0 - 0.7 K/uL   Basophils Relative 0  0 - 1 %   Basophils Absolute 0.0  0.0 - 0.1 K/uL  COMPREHENSIVE METABOLIC PANEL     Status: Abnormal   Collection Time    05/21/14  2:14 PM      Result Value Ref Range   Sodium 140  137 - 147 mEq/L   Potassium 4.4  3.7 - 5.3 mEq/L   Chloride 106  96 - 112 mEq/L   CO2 23  19 - 32 mEq/L   Glucose, Bld 108 (*) 70 - 99 mg/dL   BUN 17  6 - 23 mg/dL   Creatinine, Ser 0.96  0.50 - 1.35 mg/dL   Calcium 9.1  8.4 - 10.5 mg/dL   Total Protein 6.7  6.0 - 8.3 g/dL   Albumin 3.9  3.5 - 5.2 g/dL   AST 14  0 - 37 U/L   ALT 18  0 - 53 U/L   Alkaline Phosphatase 59  39 - 117 U/L   Total Bilirubin 1.0  0.3 - 1.2 mg/dL   GFR calc non Af Amer >90  >90 mL/min   GFR calc Af Amer >90  >90 mL/min   Anion gap 11  5 - 15  I-STAT TROPOININ, ED     Status: None   Collection Time    05/21/14  2:26 PM      Result Value Ref Range   Troponin i, poc 0.00  0.00 - 0.08 ng/mL   Comment 3  TSH     Status: None   Collection Time    05/21/14  5:20 PM      Result Value Ref Range   TSH 0.870  0.350 - 4.500 uIU/mL  TROPONIN I     Status: None   Collection Time    05/21/14  5:20 PM      Result Value Ref Range   Troponin I <0.30  <0.30 ng/mL  PRO B NATRIURETIC PEPTIDE     Status: Abnormal   Collection Time    05/21/14  5:20 PM      Result Value Ref Range   Pro B Natriuretic peptide (BNP) 250.9 (*) 0 - 125 pg/mL  TROPONIN I     Status: None   Collection Time    05/21/14 11:01 PM      Result Value Ref Range   Troponin I <0.30  <0.30 ng/mL  HEPARIN LEVEL (UNFRACTIONATED)     Status: Abnormal   Collection Time    05/21/14 11:35 PM      Result Value Ref Range   Heparin Unfractionated <0.10 (*) 0.30 - 0.70 IU/mL  CBC     Status: Abnormal   Collection Time    05/22/14  3:30 AM      Result Value Ref Range   WBC 11.2 (*) 4.0 - 10.5 K/uL   RBC 4.84  4.22 - 5.81 MIL/uL   Hemoglobin 14.9  13.0 - 17.0 g/dL   HCT 42.9  39.0 - 52.0 %   MCV 88.6  78.0 - 100.0 fL   MCH 30.8  26.0 - 34.0 pg   MCHC 34.7  30.0 - 36.0 g/dL   RDW 12.7  11.5 - 15.5 %   Platelets 159  150 - 400 K/uL  TROPONIN I     Status: None   Collection Time    05/22/14  3:30 AM      Result Value Ref Range   Troponin I <0.30  <0.30 ng/mL  PROTIME-INR     Status: None   Collection Time    05/22/14  3:30 AM      Result Value Ref Range   Prothrombin Time 14.5  11.6 - 15.2 seconds   INR 1.12  0.00 - 6.96  BASIC METABOLIC PANEL     Status: Abnormal   Collection Time    05/22/14  3:30 AM      Result Value Ref Range   Sodium 138  137 - 147 mEq/L   Potassium 4.1  3.7  - 5.3 mEq/L   Chloride 105  96 - 112 mEq/L   CO2 20  19 - 32 mEq/L   Glucose, Bld 148 (*) 70 - 99 mg/dL   BUN 20  6 - 23 mg/dL   Creatinine, Ser 0.90  0.50 - 1.35 mg/dL   Calcium 8.8  8.4 - 10.5 mg/dL   GFR calc non Af Amer >90  >90 mL/min   GFR calc Af Amer >90  >90 mL/min   Anion gap 13  5 - 15  LIPID PANEL     Status: Abnormal   Collection Time    05/22/14  3:30 AM      Result Value Ref Range   Cholesterol 171  0 - 200 mg/dL   Triglycerides 117  <150 mg/dL   HDL 46  >39 mg/dL   Total CHOL/HDL Ratio 3.7     VLDL 23  0 - 40 mg/dL   LDL Cholesterol 102 (*) 0 - 99 mg/dL  Intake/Output Summary (Last 24 hours) at 05/22/14 1016 Last data filed at 05/22/14 0230  Gross per 24 hour  Intake   2340 ml  Output    875 ml  Net   1465 ml     ASSESSMENT AND PLAN:  Atrial fib:   Still in fib.  Change to PO Cardizem and Xarelto.  Discharge.  Follow up in clinic in one week to see if still in fib.  Can set up DCCV if he is at that time.   Echo is pending.     Jeneen Rinks [facility] 05/22/2014 10:16 AM

## 2014-05-24 ENCOUNTER — Other Ambulatory Visit: Payer: Self-pay

## 2014-05-24 ENCOUNTER — Encounter (HOSPITAL_BASED_OUTPATIENT_CLINIC_OR_DEPARTMENT_OTHER): Payer: Self-pay | Admitting: Ophthalmology

## 2014-05-24 MED ORDER — RIVAROXABAN 20 MG PO TABS: 20.0000 mg | ORAL_TABLET | Freq: Every day | ORAL | Status: DC

## 2014-05-25 ENCOUNTER — Encounter: Payer: Self-pay | Admitting: Cardiology

## 2014-06-14 ENCOUNTER — Encounter: Payer: Self-pay | Admitting: *Deleted

## 2014-06-14 ENCOUNTER — Encounter: Payer: Self-pay | Admitting: Cardiology

## 2014-06-14 ENCOUNTER — Ambulatory Visit (INDEPENDENT_AMBULATORY_CARE_PROVIDER_SITE_OTHER): Payer: BC Managed Care – PPO | Admitting: Cardiology

## 2014-06-14 VITALS — BP 124/80 | HR 96 | Ht 74.0 in | Wt 249.0 lb

## 2014-06-14 DIAGNOSIS — I4891 Unspecified atrial fibrillation: Secondary | ICD-10-CM

## 2014-06-14 NOTE — Patient Instructions (Signed)
Your physician recommends that you continue on your current medications as directed. Please refer to the Current Medication list given to you today.     YOU HAVE BEEN SCHEDULED FOR A CARDIOVERSION FOR NEXT Wednesday 06/23/14 AT 1PM WITH DR [name]. PLEASE ARRIVE AT THE [facility] AT 11:30 AM THE MORNING OF YOUR PROCEDURE.       Your physician recommends that you schedule a follow-up appointment in: December 2015 [place]

## 2014-06-14 NOTE — Progress Notes (Signed)
Patient ID: Jorge Kelly, male   DOB: 09/15/1955, 58 y.o.   MRN: [id number]     Patient Name: Jorge Kelly Date of Encounter: 06/14/2014  Primary Care Provider:  Chancy Hurter, MD Primary Cardiologist:  Dorothy Spark   Problem List   Past Medical History  Diagnosis Date  . Actinic keratosis 10/24/2009  . ALLERGIC RHINITIS 05/11/2007  . ELEVATED BLOOD PRESSURE WITHOUT DIAGNOSIS OF HYPERTENSION 11/12/2007  . GERD 05/11/2007  . MIGRAINE HEADACHE 05/11/2007  . SINUSITIS- ACUTE-NOS 05/10/2010  . TINEA CRURIS 10/24/2009  . Atrial fibrillation 05/2014   Past Surgical History  Procedure Laterality Date  . Ankle fracture surgery  2010    x2-left  . Upper gi endoscopy    . Malignant melanoma      removed from forehead in June 2015  . Strabismus surgery Right 05/21/2014    Procedure: REPAIR STRABISMUS RIGHT EYE;  Surgeon: Derry Skill, MD;  Location: [place];  Service: Ophthalmology;  Laterality: Right;    Allergies  No Known Allergies  HPI  Jorge Kelly is a 58 y.o. male with a history of GERD, esophageal stricture and GI bleed, malignant melanoma on his forehead s/p resection and no prior cardiac history who was admitted to [facility] on 05/21/2014 for an ophthalmologic procedure and found to be in atrial fibrillation with RVR post op. He was placed on IV Heparin and IV Diltiazem. Duration of AFib was not certain. It was felt that if he did not convert to NSR on his own, that he could go home on a NOAC with plans for DCCV in the next 3-4 weeks. CHADS2-VASc=0 but he was started on Xarelto. HR was better controlled on Diltiazem. Echocardiogram was performed and revealed normal systolic function with EF at 50-55%. He was seen by Dr. Minus Breeding this AM and felt to be stable for DC to home. He was transitioned to oral Diltiazem. Heparin was transitioned to Xarelto.   The patient is coming after 3 weeks he is still in atrial fibrillation. He states that he felt  fluttering in the past that he educated to reflux disease but now is questioning if it was really atrial fibrillation. He denies any syncope.  Home Medications  Prior to Admission medications   Medication Sig Start Date End Date Taking? Authorizing Provider  diltiazem (CARDIZEM CD) 120 MG 24 hr capsule Take 1 capsule (120 mg total) by mouth daily. 05/22/14  Yes Liliane Shi, PA-C  rivaroxaban (XARELTO) 20 MG TABS tablet Take 1 tablet (20 mg total) by mouth daily with supper. 05/24/14  Yes Dorothy Spark, MD  omeprazole (PRILOSEC) 20 MG capsule Take 20 mg by mouth daily.    Historical Provider, MD  oxyCODONE-acetaminophen (PERCOCET) 7.5-325 MG per tablet Take 1 tablet by mouth every 4 (four) hours as needed for pain. 05/21/14   Derry Skill, MD  rizatriptan (MAXALT-MLT) 10 MG disintegrating tablet Take 10 mg by mouth as needed for migraine. May repeat in 2 hours if needed    Historical Provider, MD  tobramycin-dexamethasone [facility]) ophthalmic ointment Place 1 application into the right eye 2 (two) times daily. 05/21/14   Derry Skill, MD    Family History  Family History  Problem Relation Age of Onset  . Lymphoma Mother   . Atrial fibrillation Mother   . Leukemia Father     Social History  History   Social History  . Marital Status: Married    Spouse Name: N/A  Number of Children: N/A  . Years of Education: N/A   Occupational History  . Not on file.   Social History Main Topics  . Smoking status: Never Smoker   . Smokeless tobacco: Never Used  . Alcohol Use: Yes     Comment: rarely  . Drug Use: No  . Sexual Activity: Not on file   Other Topics Concern  . Not on file   Social History Narrative     Review of Systems, as per HPI, otherwise negative General:  No chills, fever, night sweats or weight changes.  Cardiovascular:  No chest pain, dyspnea on exertion, edema, orthopnea, palpitations, paroxysmal nocturnal dyspnea. Dermatological: No rash,  lesions/masses Respiratory: No cough, dyspnea Urologic: No hematuria, dysuria Abdominal:   No nausea, vomiting, diarrhea, bright red blood per rectum, melena, or hematemesis Neurologic:  No visual changes, wkns, changes in mental status. All other systems reviewed and are otherwise negative except as noted above.  Physical Exam  Blood pressure 124/80, pulse 96, height 6\' 2"  (1.88 m), weight 249 lb (112.946 kg), SpO2 96 %.  General: Pleasant, NAD Psych: Normal affect. Neuro: Alert and oriented X 3. Moves all extremities spontaneously. HEENT: Normal  Neck: Supple without bruits or JVD. Lungs:  Resp regular and unlabored, CTA. Heart: RRR no s3, s4, or murmurs. Abdomen: Soft, non-tender, non-distended, BS + x 4.  Extremities: No clubbing, cyanosis or edema. DP/PT/Radials 2+ and equal bilaterally.  Labs:  No results for input(s): CKTOTAL, CKMB, TROPONINI in the last 72 hours. Lab Results  Component Value Date   WBC 11.2* 05/22/2014   HGB 14.9 05/22/2014   HCT 42.9 05/22/2014   MCV 88.6 05/22/2014   PLT 159 05/22/2014    No results found for: DDIMER Invalid input(s): POCBNP    Component Value Date/Time   NA 138 05/22/2014 0330   K 4.1 05/22/2014 0330   CL 105 05/22/2014 0330   CO2 20 05/22/2014 0330   GLUCOSE 148* 05/22/2014 0330   BUN 20 05/22/2014 0330   CREATININE 0.90 05/22/2014 0330   CALCIUM 8.8 05/22/2014 0330   PROT 6.7 05/21/2014 1414   ALBUMIN 3.9 05/21/2014 1414   AST 14 05/21/2014 1414   ALT 18 05/21/2014 1414   ALKPHOS 59 05/21/2014 1414   BILITOT 1.0 05/21/2014 1414   GFRNONAA >90 05/22/2014 0330   GFRAA >90 05/22/2014 0330   Lab Results  Component Value Date   CHOL 171 05/22/2014   HDL 46 05/22/2014   LDLCALC 102* 05/22/2014   TRIG 117 05/22/2014    Accessory Clinical Findings  Echocardiogram - 05/22/2014 Left ventricle: The cavity size was normal. There was mild concentric hypertrophy. Systolic function was normal. The estimated ejection  fraction was in the range of 50% to 55%. Wall motion was normal; there were no regional wall motion abnormalities. The study was not technically sufficient to allow evaluation of LV diastolic dysfunction due to atrial fibrillation. - Aortic valve: Structurally normal valve. There was mild regurgitation. - Ascending aorta: The ascending aorta was normal in size. - Mitral valve: Structurally normal valve. There was mild regurgitation. - Left atrium: The atrium was moderately dilated. - Right ventricle: The cavity size was mildly dilated. Wall thickness was normal. Systolic function was normal. - Right atrium: The atrium was mildly dilated. - Tricuspid valve: There was mild regurgitation. - Pulmonic valve: There was no regurgitation. - Pulmonary arteries: Systolic pressure was within the normal range. - Inferior vena cava: The vessel was dilated. The respirophasic diameter changes were blunted (<  50%), consistent with elevated central venous pressure. - Pericardium, extracardiac: The pericardium was normal in appearance.  Impressions:  - Normal left ventricular size and function with mild concentric left ventricular hypertrophy. Moderately dilated left atrium, mildly dilated right atrium and ventricle. Normal right ventricular systolic function. Mild aortic, mitral and tricuspid regurgitation. RVSP normal.  ECG - atrial fibrillation, 96 BPM   Assessment & Plan  58 year old gentleman with newly diagnosed atrial fibrillation just discharged from the hospital 3 weeks ago. His CHADS2-VASc=0 score is 0 but he was started on Xarelto for anticipation of cardioversion.   He is in persistent atrial fibrillation on Cardizem by mouth, we will schedule a cardioversion next Wednesday, November 18. Patient is advised to continue taking all his medicines.    Dorothy Spark, MD, [facility] 06/14/2014, 12:12 PM

## 2014-06-23 ENCOUNTER — Ambulatory Visit (HOSPITAL_COMMUNITY): Payer: BC Managed Care – PPO | Admitting: Anesthesiology

## 2014-06-23 ENCOUNTER — Encounter (HOSPITAL_COMMUNITY): Payer: Self-pay

## 2014-06-23 ENCOUNTER — Encounter (HOSPITAL_COMMUNITY)
Admission: RE | Disposition: A | Discharge status: Home / Self Care | Payer: Self-pay | Source: Ambulatory Visit | Attending: Cardiology

## 2014-06-23 ENCOUNTER — Ambulatory Visit (HOSPITAL_COMMUNITY)
Admission: RE | Admit: 2014-06-23 | Discharge: 2014-06-23 | Disposition: A | Discharge status: Home / Self Care | Payer: BC Managed Care – PPO | Source: Ambulatory Visit | Attending: Cardiology | Admitting: Cardiology

## 2014-06-23 DIAGNOSIS — I10 Essential (primary) hypertension: Secondary | ICD-10-CM | POA: Diagnosis not present

## 2014-06-23 DIAGNOSIS — I4891 Unspecified atrial fibrillation: Secondary | ICD-10-CM

## 2014-06-23 DIAGNOSIS — G43909 Migraine, unspecified, not intractable, without status migrainosus: Secondary | ICD-10-CM | POA: Insufficient documentation

## 2014-06-23 DIAGNOSIS — Z85820 Personal history of malignant melanoma of skin: Secondary | ICD-10-CM | POA: Diagnosis not present

## 2014-06-23 DIAGNOSIS — K219 Gastro-esophageal reflux disease without esophagitis: Secondary | ICD-10-CM | POA: Diagnosis not present

## 2014-06-23 DIAGNOSIS — I481 Persistent atrial fibrillation: Secondary | ICD-10-CM | POA: Diagnosis not present

## 2014-06-23 HISTORY — PX: CARDIOVERSION: SHX1299

## 2014-06-23 SURGERY — CARDIOVERSION
Anesthesia: General

## 2014-06-23 MED ORDER — PROPOFOL 10 MG/ML IV BOLUS
INTRAVENOUS | Status: DC | PRN
  Administered 2014-06-23 (×2): 60 mg via INTRAVENOUS
  Administered 2014-06-23: 40 mg via INTRAVENOUS

## 2014-06-23 MED ORDER — SODIUM CHLORIDE 0.9 % IV SOLN
INTRAVENOUS | Status: DC
  Administered 2014-06-23: 12:00:00 via INTRAVENOUS

## 2014-06-23 MED ORDER — SODIUM CHLORIDE 0.9 % IV SOLN
INTRAVENOUS | Status: DC | PRN
  Administered 2014-06-23: 13:00:00 via INTRAVENOUS

## 2014-06-23 MED ORDER — LACTATED RINGERS IV SOLN: INTRAVENOUS | Status: DC

## 2014-06-23 MED ORDER — LIDOCAINE HCL (CARDIAC) 20 MG/ML IV SOLN
INTRAVENOUS | Status: DC | PRN
  Administered 2014-06-23: 50 mg via INTRAVENOUS

## 2014-06-23 NOTE — CV Procedure (Signed)
    Cardioversion Note  Jorge Kelly [id number] [birth year]  Procedure: DC Cardioversion Indications: atrial fibrillation  Procedure Details Consent: Obtained Time Out: Verified patient identification, verified procedure, site/side was marked, verified correct patient position, special equipment/implants available, Radiology Safety Procedures followed,  medications/allergies/relevent history reviewed, required imaging and test results available.  Performed  The patient has been on adequate anticoagulation.  The patient received IV 160 mg Propofol and 50 mg Lidocan administered by anesthesiologist for sedation.  Synchronous cardioversion was performed at 120 x 1 joules.  The cardioversion was successful.   Complications: No apparent complications Patient did tolerate procedure well.   Dorothy Spark, MD, [facility] 06/23/2014, 12:49 PM

## 2014-06-23 NOTE — Discharge Instructions (Signed)

## 2014-06-23 NOTE — Transfer of Care (Signed)
Immediate Anesthesia Transfer of Care Note  Patient: Jorge Kelly  Procedure(s) Performed: Procedure(s): CARDIOVERSION (N/A)  Patient Location: PACU  Anesthesia Type:MAC  Level of Consciousness: awake, alert  and oriented  Airway & Oxygen Therapy: Patient Spontanous Breathing and Patient connected to nasal cannula oxygen  Post-op Assessment: Report given to PACU RN and Post -op Vital signs reviewed and stable  Post vital signs: Reviewed and stable  Complications: No apparent anesthesia complications

## 2014-06-23 NOTE — H&P (View-Only) (Signed)
Patient ID: ZIAIRE HAGOS, male   DOB: 02/01/1956, 58 y.o.   MRN: [id number]     Patient Name: Jorge Kelly Date of Encounter: 06/14/2014  Primary Care Provider:  Chancy Hurter, MD Primary Cardiologist:  Dorothy Spark   Problem List   Past Medical History  Diagnosis Date  . Actinic keratosis 10/24/2009  . ALLERGIC RHINITIS 05/11/2007  . ELEVATED BLOOD PRESSURE WITHOUT DIAGNOSIS OF HYPERTENSION 11/12/2007  . GERD 05/11/2007  . MIGRAINE HEADACHE 05/11/2007  . SINUSITIS- ACUTE-NOS 05/10/2010  . TINEA CRURIS 10/24/2009  . Atrial fibrillation 05/2014   Past Surgical History  Procedure Laterality Date  . Ankle fracture surgery  2010    x2-left  . Upper gi endoscopy    . Malignant melanoma      removed from forehead in June 2015  . Strabismus surgery Right 05/21/2014    Procedure: REPAIR STRABISMUS RIGHT EYE;  Surgeon: Derry Skill, MD;  Location: [place];  Service: Ophthalmology;  Laterality: Right;    Allergies  No Known Allergies  HPI  Jorge Kelly is a 58 y.o. male with a history of GERD, esophageal stricture and GI bleed, malignant melanoma on his forehead s/p resection and no prior cardiac history who was admitted to [facility] on 05/21/2014 for an ophthalmologic procedure and found to be in atrial fibrillation with RVR post op. He was placed on IV Heparin and IV Diltiazem. Duration of AFib was not certain. It was felt that if he did not convert to NSR on his own, that he could go home on a NOAC with plans for DCCV in the next 3-4 weeks. CHADS2-VASc=0 but he was started on Xarelto. HR was better controlled on Diltiazem. Echocardiogram was performed and revealed normal systolic function with EF at 50-55%. He was seen by Dr. Minus Breeding this AM and felt to be stable for DC to home. He was transitioned to oral Diltiazem. Heparin was transitioned to Xarelto.   The patient is coming after 3 weeks he is still in atrial fibrillation. He states that he felt  fluttering in the past that he educated to reflux disease but now is questioning if it was really atrial fibrillation. He denies any syncope.  Home Medications  Prior to Admission medications   Medication Sig Start Date End Date Taking? Authorizing Provider  diltiazem (CARDIZEM CD) 120 MG 24 hr capsule Take 1 capsule (120 mg total) by mouth daily. 05/22/14  Yes Liliane Shi, PA-C  rivaroxaban (XARELTO) 20 MG TABS tablet Take 1 tablet (20 mg total) by mouth daily with supper. 05/24/14  Yes Dorothy Spark, MD  omeprazole (PRILOSEC) 20 MG capsule Take 20 mg by mouth daily.    Historical Provider, MD  oxyCODONE-acetaminophen (PERCOCET) 7.5-325 MG per tablet Take 1 tablet by mouth every 4 (four) hours as needed for pain. 05/21/14   Derry Skill, MD  rizatriptan (MAXALT-MLT) 10 MG disintegrating tablet Take 10 mg by mouth as needed for migraine. May repeat in 2 hours if needed    Historical Provider, MD  tobramycin-dexamethasone [facility]) ophthalmic ointment Place 1 application into the right eye 2 (two) times daily. 05/21/14   Derry Skill, MD    Family History  Family History  Problem Relation Age of Onset  . Lymphoma Mother   . Atrial fibrillation Mother   . Leukemia Father     Social History  History   Social History  . Marital Status: Married    Spouse Name: N/A  Number of Children: N/A  . Years of Education: N/A   Occupational History  . Not on file.   Social History Main Topics  . Smoking status: Never Smoker   . Smokeless tobacco: Never Used  . Alcohol Use: Yes     Comment: rarely  . Drug Use: No  . Sexual Activity: Not on file   Other Topics Concern  . Not on file   Social History Narrative     Review of Systems, as per HPI, otherwise negative General:  No chills, fever, night sweats or weight changes.  Cardiovascular:  No chest pain, dyspnea on exertion, edema, orthopnea, palpitations, paroxysmal nocturnal dyspnea. Dermatological: No rash,  lesions/masses Respiratory: No cough, dyspnea Urologic: No hematuria, dysuria Abdominal:   No nausea, vomiting, diarrhea, bright red blood per rectum, melena, or hematemesis Neurologic:  No visual changes, wkns, changes in mental status. All other systems reviewed and are otherwise negative except as noted above.  Physical Exam  Blood pressure 124/80, pulse 96, height 6\' 2"  (1.88 m), weight 249 lb (112.946 kg), SpO2 96 %.  General: Pleasant, NAD Psych: Normal affect. Neuro: Alert and oriented X 3. Moves all extremities spontaneously. HEENT: Normal  Neck: Supple without bruits or JVD. Lungs:  Resp regular and unlabored, CTA. Heart: RRR no s3, s4, or murmurs. Abdomen: Soft, non-tender, non-distended, BS + x 4.  Extremities: No clubbing, cyanosis or edema. DP/PT/Radials 2+ and equal bilaterally.  Labs:  No results for input(s): CKTOTAL, CKMB, TROPONINI in the last 72 hours. Lab Results  Component Value Date   WBC 11.2* 05/22/2014   HGB 14.9 05/22/2014   HCT 42.9 05/22/2014   MCV 88.6 05/22/2014   PLT 159 05/22/2014    No results found for: DDIMER Invalid input(s): POCBNP    Component Value Date/Time   NA 138 05/22/2014 0330   K 4.1 05/22/2014 0330   CL 105 05/22/2014 0330   CO2 20 05/22/2014 0330   GLUCOSE 148* 05/22/2014 0330   BUN 20 05/22/2014 0330   CREATININE 0.90 05/22/2014 0330   CALCIUM 8.8 05/22/2014 0330   PROT 6.7 05/21/2014 1414   ALBUMIN 3.9 05/21/2014 1414   AST 14 05/21/2014 1414   ALT 18 05/21/2014 1414   ALKPHOS 59 05/21/2014 1414   BILITOT 1.0 05/21/2014 1414   GFRNONAA >90 05/22/2014 0330   GFRAA >90 05/22/2014 0330   Lab Results  Component Value Date   CHOL 171 05/22/2014   HDL 46 05/22/2014   LDLCALC 102* 05/22/2014   TRIG 117 05/22/2014    Accessory Clinical Findings  Echocardiogram - 05/22/2014 Left ventricle: The cavity size was normal. There was mild concentric hypertrophy. Systolic function was normal. The estimated ejection  fraction was in the range of 50% to 55%. Wall motion was normal; there were no regional wall motion abnormalities. The study was not technically sufficient to allow evaluation of LV diastolic dysfunction due to atrial fibrillation. - Aortic valve: Structurally normal valve. There was mild regurgitation. - Ascending aorta: The ascending aorta was normal in size. - Mitral valve: Structurally normal valve. There was mild regurgitation. - Left atrium: The atrium was moderately dilated. - Right ventricle: The cavity size was mildly dilated. Wall thickness was normal. Systolic function was normal. - Right atrium: The atrium was mildly dilated. - Tricuspid valve: There was mild regurgitation. - Pulmonic valve: There was no regurgitation. - Pulmonary arteries: Systolic pressure was within the normal range. - Inferior vena cava: The vessel was dilated. The respirophasic diameter changes were blunted (<  50%), consistent with elevated central venous pressure. - Pericardium, extracardiac: The pericardium was normal in appearance.  Impressions:  - Normal left ventricular size and function with mild concentric left ventricular hypertrophy. Moderately dilated left atrium, mildly dilated right atrium and ventricle. Normal right ventricular systolic function. Mild aortic, mitral and tricuspid regurgitation. RVSP normal.  ECG - atrial fibrillation, 96 BPM   Assessment & Plan  58 year old gentleman with newly diagnosed atrial fibrillation just discharged from the hospital 3 weeks ago. His CHADS2-VASc=0 score is 0 but he was started on Xarelto for anticipation of cardioversion.   He is in persistent atrial fibrillation on Cardizem by mouth, we will schedule a cardioversion next Wednesday, November 18. Patient is advised to continue taking all his medicines.    Dorothy Spark, MD, [facility] 06/14/2014, 12:12 PM

## 2014-06-23 NOTE — Anesthesia Preprocedure Evaluation (Addendum)
Anesthesia Evaluation  Patient identified by MRN, date of birth, ID band Patient awake    Reviewed: Allergy & Precautions, H&P , NPO status , Patient's Chart, lab work & pertinent test results  Airway Mallampati: II  TM Distance: >3 FB Neck ROM: Full    Dental  (+) Teeth Intact, Dental Advisory Given   Pulmonary neg pulmonary ROS,          Cardiovascular negative cardio ROS  + dysrhythmias Atrial Fibrillation     Neuro/Psych  Headaches, negative psych ROS   GI/Hepatic Neg liver ROS, GERD-  Medicated,  Endo/Other  Morbid obesity  Renal/GU negative Renal ROS     Musculoskeletal   Abdominal   Peds  Hematology negative hematology ROS (+)   Anesthesia Other Findings   Reproductive/Obstetrics                            Anesthesia Physical Anesthesia Plan  ASA: II  Anesthesia Plan: General   Post-op Pain Management:    Induction: Intravenous  Airway Management Planned: Mask  Additional Equipment:   Intra-op Plan:   Post-operative Plan:   Informed Consent: I have reviewed the patients History and Physical, chart, labs and discussed the procedure including the risks, benefits and alternatives for the proposed anesthesia with the patient or authorized representative who has indicated his/her understanding and acceptance.     Plan Discussed with: CRNA and Surgeon  Anesthesia Plan Comments:         Anesthesia Quick Evaluation

## 2014-06-23 NOTE — Anesthesia Postprocedure Evaluation (Signed)
  Anesthesia Post-op Note  Patient: Jorge Kelly  Procedure(s) Performed: Procedure(s): CARDIOVERSION (N/A)  Patient Location: PACU and Endoscopy Unit  Anesthesia Type:General  Level of Consciousness: awake, alert  and oriented  Airway and Oxygen Therapy: Patient Spontanous Breathing  Post-op Pain: none  Post-op Assessment: Post-op Vital signs reviewed  Post-op Vital Signs: Reviewed and stable  Last Vitals:  Filed Vitals:   06/23/14 1338  BP: 115/86  Pulse: 97  Temp: 36.5 C  Resp: 14    Complications: No apparent anesthesia complications

## 2014-06-23 NOTE — Interval H&P Note (Signed)
History and Physical Interval Note:  06/23/2014 12:48 PM  Jorge Kelly  has presented today for surgery, with the diagnosis of afib  The various methods of treatment have been discussed with the patient and family. After consideration of risks, benefits and other options for treatment, the patient has consented to  Procedure(s): CARDIOVERSION (N/A) as a surgical intervention .  The patient's history has been reviewed, patient examined, no change in status, stable for surgery.  I have reviewed the patient's chart and labs.  Questions were answered to the patient's satisfaction.     Dorothy Spark

## 2014-06-24 ENCOUNTER — Encounter (HOSPITAL_COMMUNITY): Payer: Self-pay | Admitting: Cardiology

## 2014-06-28 ENCOUNTER — Telehealth: Payer: Self-pay | Admitting: Cardiology

## 2014-06-28 NOTE — Telephone Encounter (Signed)
I would increase his Cardizem dose to 180 mg CD daily. Follow up with me sometimes in December.

## 2014-06-28 NOTE — Telephone Encounter (Signed)
New message           Pt thinks he may be back in afib since his recent surgery / please give pt a call

## 2014-06-28 NOTE — Telephone Encounter (Signed)
Pt calling to make Dr [name] aware that he went back into afib last Saturday night and felt very fatigued, with fluttering sensations in his chest.  Pt states he did not have SOB, or dizziness, syncopal, or presyncopal episode last Saturday night.  Pt states he was recently cardioverted last week by Dr [name], and felt great, and did go back into NSR.  Pt states after the cardioversion he felt so good, and hasn't felt that way in years.  Pt states he was feeling great until Saturday night.  Asked pt if he is being compliant with all meds prescribed, especially his Xarelto and Caridzem.  Pt states he did forget to take both his Xarelto and Cardizem last Saturday evening.  Pt states that's when he noticed he went back into afib, with unknown rate.  Pt states he did wake up on Sunday morning and took both the Cardizem and Xarelto, and noted hours later he was starting to feel better.  Pt denies any cp, sob, dizziness, syncopal, pre-syncopal episodes, palpitations. Pt states he feels like "he's back in NSR." Pt just wanted to keep Dr [name] informed and see if she has any other recommendations, for he is very fearful for going back in afib.  Informed the pt that Dr [name] is out of the office this morning, but will be back this afternoon.  Advised the pt that he should be compliant with taking all his cardiac meds, at their scheduled times, and call back to report symptoms to the office on Wednesday to let myself or Dr [name] aware if he thinks he's back in afib or NSR.  Informed the pt that I will route this message to Dr [name] for further review and recommendation and follow-up thereafter as well.  Pt verbalized understanding and agrees with this plan.

## 2014-06-29 MED ORDER — DILTIAZEM HCL ER COATED BEADS 180 MG PO CP24: 180.0000 mg | ORAL_CAPSULE | Freq: Every day | ORAL | Status: DC

## 2014-06-29 NOTE — Telephone Encounter (Signed)
Pt notified that per Dr [name] she would like to increase his Cardizem to 180 mg CD po daily, and have the pt follow-up with her in December.  Noted in pts appts that he already has an established post-hospital appt with Dr [name] on 07/06/14.  Confirmed the pharmacy of choice with the pt.  Pt verbalized understanding and agrees with this plan.

## 2014-07-06 ENCOUNTER — Ambulatory Visit (INDEPENDENT_AMBULATORY_CARE_PROVIDER_SITE_OTHER): Payer: BC Managed Care – PPO | Admitting: Cardiology

## 2014-07-06 ENCOUNTER — Encounter: Payer: Self-pay | Admitting: Cardiology

## 2014-07-06 VITALS — BP 110/80 | HR 66 | Ht 74.0 in | Wt 242.1 lb

## 2014-07-06 DIAGNOSIS — R0609 Other forms of dyspnea: Secondary | ICD-10-CM

## 2014-07-06 DIAGNOSIS — I4891 Unspecified atrial fibrillation: Secondary | ICD-10-CM

## 2014-07-06 DIAGNOSIS — R0600 Dyspnea, unspecified: Secondary | ICD-10-CM | POA: Insufficient documentation

## 2014-07-06 DIAGNOSIS — I10 Essential (primary) hypertension: Secondary | ICD-10-CM | POA: Insufficient documentation

## 2014-07-06 MED ORDER — DILTIAZEM HCL ER COATED BEADS 120 MG PO CP24: 120.0000 mg | ORAL_CAPSULE | Freq: Every day | ORAL | Status: DC

## 2014-07-06 MED ORDER — DRONEDARONE HCL 400 MG PO TABS: 400.0000 mg | ORAL_TABLET | Freq: Two times a day (BID) | ORAL | Status: DC

## 2014-07-06 NOTE — Progress Notes (Signed)
Patient ID: FAVOR HACKLER, male   DOB: July 05, 1956, 58 y.o.   MRN: [id number]     Patient Name: Jorge Kelly Date of Encounter: 07/06/2014  Primary Care Provider:  Chancy Hurter, MD Primary Cardiologist:  Dorothy Spark   Problem List   Past Medical History  Diagnosis Date  . Actinic keratosis 10/24/2009  . ALLERGIC RHINITIS 05/11/2007  . ELEVATED BLOOD PRESSURE WITHOUT DIAGNOSIS OF HYPERTENSION 11/12/2007  . GERD 05/11/2007  . MIGRAINE HEADACHE 05/11/2007  . SINUSITIS- ACUTE-NOS 05/10/2010  . TINEA CRURIS 10/24/2009  . Atrial fibrillation 05/2014   Past Surgical History  Procedure Laterality Date  . Ankle fracture surgery  2010    x2-left  . Upper gi endoscopy    . Malignant melanoma      removed from forehead in June 2015  . Strabismus surgery Right 05/21/2014    Procedure: REPAIR STRABISMUS RIGHT EYE;  Surgeon: Derry Skill, MD;  Location: [place];  Service: Ophthalmology;  Laterality: Right;  . Cardioversion N/A 06/23/2014    Procedure: CARDIOVERSION;  Surgeon: Dorothy Spark, MD;  Location: [place];  Service: Cardiovascular;  Laterality: N/A;    Allergies  No Known Allergies  HPI  Jorge Kelly is a 58 y.o. male with a history of GERD, esophageal stricture and GI bleed, malignant melanoma on his forehead s/p resection and no prior cardiac history who was admitted to [facility] on 05/21/2014 for an ophthalmologic procedure and found to be in atrial fibrillation with RVR post op. He was placed on IV Heparin and IV Diltiazem. Duration of AFib was not certain. It was felt that if he did not convert to NSR on his own, that he could go home on a NOAC with plans for DCCV in the next 3-4 weeks. CHADS2-VASc=0 but he was started on Xarelto. HR was better controlled on Diltiazem. Echocardiogram was performed and revealed normal systolic function with EF at 50-55%. He was seen by Dr. Minus Breeding this AM and felt to be stable for DC to home. He was  transitioned to oral Diltiazem. Heparin was transitioned to Xarelto.   The patient is coming after 3 weeks he is still in atrial fibrillation. He states that he felt fluttering in the past that he educated to reflux disease but now is questioning if it was really atrial fibrillation. He denies any syncope.  07/06/2014 - the patient felt great after DCCV, however only few days later developed palpitations again. No dizziness or syncope, but fatigue. He is physically active and has noticed worsening DOE, no chest pain. He attributed it to weight gain. He has significant family h/o premature CAD in multiple uncles.  Home Medications  Prior to Admission medications   Medication Sig Start Date End Date Taking? Authorizing Provider  diltiazem (CARDIZEM CD) 120 MG 24 hr capsule Take 1 capsule (120 mg total) by mouth daily. 05/22/14  Yes Liliane Shi, PA-C  rivaroxaban (XARELTO) 20 MG TABS tablet Take 1 tablet (20 mg total) by mouth daily with supper. 05/24/14  Yes Dorothy Spark, MD  omeprazole (PRILOSEC) 20 MG capsule Take 20 mg by mouth daily.    Historical Provider, MD  oxyCODONE-acetaminophen (PERCOCET) 7.5-325 MG per tablet Take 1 tablet by mouth every 4 (four) hours as needed for pain. 05/21/14   Derry Skill, MD  rizatriptan (MAXALT-MLT) 10 MG disintegrating tablet Take 10 mg by mouth as needed for migraine. May repeat in 2 hours if needed    Historical Provider,  MD  tobramycin-dexamethasone (TOBRADEX) ophthalmic ointment Place 1 application into the right eye 2 (two) times daily. 05/21/14   Derry Skill, MD    Family History  Family History  Problem Relation Age of Onset  . Lymphoma Mother   . Atrial fibrillation Mother   . Leukemia Father    Social History  History   Social History  . Marital Status: Married    Spouse Name: N/A    Number of Children: N/A  . Years of Education: N/A   Occupational History  . Not on file.   Social History Main Topics  . Smoking  status: Never Smoker   . Smokeless tobacco: Never Used  . Alcohol Use: Yes     Comment: rarely  . Drug Use: No  . Sexual Activity: Not on file   Other Topics Concern  . Not on file   Social History Narrative    Review of Systems, as per HPI, otherwise negative General:  No chills, fever, night sweats or weight changes.  Cardiovascular:  No chest pain, dyspnea on exertion, edema, orthopnea, palpitations, paroxysmal nocturnal dyspnea. Dermatological: No rash, lesions/masses Respiratory: No cough, dyspnea Urologic: No hematuria, dysuria Abdominal:   No nausea, vomiting, diarrhea, bright red blood per rectum, melena, or hematemesis Neurologic:  No visual changes, wkns, changes in mental status. All other systems reviewed and are otherwise negative except as noted above.  Physical Exam  Blood pressure 110/80, pulse 66, height 6\' 2"  (1.88 m), weight 242 lb 1.9 oz (109.825 kg).  General: Pleasant, NAD Psych: Normal affect. Neuro: Alert and oriented X 3. Moves all extremities spontaneously. HEENT: Normal  Neck: Supple without bruits or JVD. Lungs:  Resp regular and unlabored, CTA. Heart: RRR no s3, s4, or murmurs. Abdomen: Soft, non-tender, non-distended, BS + x 4.  Extremities: No clubbing, cyanosis or edema. DP/PT/Radials 2+ and equal bilaterally.  Labs:  No results for input(s): CKTOTAL, CKMB, TROPONINI in the last 72 hours. Lab Results  Component Value Date   WBC 11.2* 05/22/2014   HGB 14.9 05/22/2014   HCT 42.9 05/22/2014   MCV 88.6 05/22/2014   PLT 159 05/22/2014    No results found for: DDIMER Invalid input(s): POCBNP    Component Value Date/Time   NA 138 05/22/2014 0330   K 4.1 05/22/2014 0330   CL 105 05/22/2014 0330   CO2 20 05/22/2014 0330   GLUCOSE 148* 05/22/2014 0330   BUN 20 05/22/2014 0330   CREATININE 0.90 05/22/2014 0330   CALCIUM 8.8 05/22/2014 0330   PROT 6.7 05/21/2014 1414   ALBUMIN 3.9 05/21/2014 1414   AST 14 05/21/2014 1414   ALT 18  05/21/2014 1414   ALKPHOS 59 05/21/2014 1414   BILITOT 1.0 05/21/2014 1414   GFRNONAA >90 05/22/2014 0330   GFRAA >90 05/22/2014 0330   Lab Results  Component Value Date   CHOL 171 05/22/2014   HDL 46 05/22/2014   LDLCALC 102* 05/22/2014   TRIG 117 05/22/2014    Accessory Clinical Findings  Echocardiogram - 05/22/2014 Left ventricle: The cavity size was normal. There was mild concentric hypertrophy. Systolic function was normal. The estimated ejection fraction was in the range of 50% to 55%. Wall motion was normal; there were no regional wall motion abnormalities. The study was not technically sufficient to allow evaluation of LV diastolic dysfunction due to atrial fibrillation. - Aortic valve: Structurally normal valve. There was mild regurgitation. - Ascending aorta: The ascending aorta was normal in size. - Mitral valve: Structurally normal  valve. There was mild regurgitation. - Left atrium: The atrium was moderately dilated. - Right ventricle: The cavity size was mildly dilated. Wall thickness was normal. Systolic function was normal. - Right atrium: The atrium was mildly dilated. - Tricuspid valve: There was mild regurgitation. - Pulmonic valve: There was no regurgitation. - Pulmonary arteries: Systolic pressure was within the normal range. - Inferior vena cava: The vessel was dilated. The respirophasic diameter changes were blunted (< 50%), consistent with elevated central venous pressure. - Pericardium, extracardiac: The pericardium was normal in appearance.  Impressions:  - Normal left ventricular size and function with mild concentric left ventricular hypertrophy. Moderately dilated left atrium, mildly dilated right atrium and ventricle. Normal right ventricular systolic function. Mild aortic, mitral and tricuspid regurgitation. RVSP normal.  ECG - atrial fibrillation, 96 BPM     Assessment & Plan  58 year old  gentleman with   1. newly diagnosed atrial fibrillation just discharged from the hospital 3 weeks ago. His CHADS2-VASc=0 score is 0 but he was started on Xarelto for anticipation of cardioversion.  DCCV successful, however back in a-fib just few days post cardioversion. We will start Multaq 400 mg po BID along with decreased dose of cardizem 120 mg po daily. Continue xarelto.   We will refer to Dr [name] for RFA consideration as his LA is moderately dilated already.   2. Progressively worsening DOE - We will order a treadmill nuclear stress test. This will also be helpful if we need to initiate class I antiarrhytmics.  3. Hypertension - controlled with Cardizem  Follow up in 1 month.  Dorothy Spark, MD, [facility] 07/06/2014, 10:29 AM

## 2014-07-06 NOTE — Patient Instructions (Signed)
Your physician has recommended you make the following change in your medication:   START TAKING CARDIZEM 120 MG ONCE DAILY   START TAKING MULTAQ [place]   Your physician has requested that you have en exercise stress myoview. For further information please visit [email or web address]. Please follow instruction sheet, as given.    You have been referred to DR [facility] FOR YOUR AFIB- FOR CONSIDERATION OF ABLATION   Your physician recommends that you schedule a follow-up appointment in: DR [place]

## 2014-07-13 ENCOUNTER — Ambulatory Visit (HOSPITAL_COMMUNITY): Payer: BC Managed Care – PPO | Attending: Cardiology | Admitting: Radiology

## 2014-07-13 DIAGNOSIS — R0609 Other forms of dyspnea: Secondary | ICD-10-CM

## 2014-07-13 DIAGNOSIS — R0600 Dyspnea, unspecified: Secondary | ICD-10-CM | POA: Diagnosis present

## 2014-07-13 DIAGNOSIS — R0602 Shortness of breath: Secondary | ICD-10-CM

## 2014-07-13 MED ORDER — REGADENOSON 0.4 MG/5ML IV SOLN
0.4000 mg | Freq: Once | INTRAVENOUS | Status: AC
  Administered 2014-07-13: 0.4 mg via INTRAVENOUS

## 2014-07-13 MED ORDER — TECHNETIUM TC 99M SESTAMIBI GENERIC - CARDIOLITE
10.0000 | Freq: Once | INTRAVENOUS | Status: AC | PRN
  Administered 2014-07-13: 10 via INTRAVENOUS

## 2014-07-13 MED ORDER — TECHNETIUM TC 99M SESTAMIBI GENERIC - CARDIOLITE
30.0000 | Freq: Once | INTRAVENOUS | Status: AC | PRN
  Administered 2014-07-13: 30 via INTRAVENOUS

## 2014-07-13 NOTE — Progress Notes (Signed)
[facility] NUCLEAR [facility] [address] [place], [place] [address] [phone]    Cardiology Nuclear Med Study  Jorge Kelly is a 58 y.o. male     MRN : [id number]     DOB: 07/30/1956  Procedure Date: 07/13/2014  Nuclear Med Background Indication for Stress Test:  Evaluation for Ischemia History:  MPI 2001 (normal) EF 44%, Afib (cardioversion 11/15) Cardiac Risk Factors: Hypertension  Symptoms:  DOE and Palpitations   Nuclear Pre-Procedure Caffeine/Decaff Intake:  None> 12 hrs NPO After: 8:00pm   Lungs:  clear O2 Sat: 94% on room air. IV 0.9% NS with Angio Cath:  22g  IV Site: R Hand x 1, tolerated well IV Started by:  Irven Baltimore, RN  Chest Size (in):  44 Cup Size: n/a  Height: 6\' 2"  (1.88 m)  Weight:  237 lb (107.502 kg)  BMI:  Body mass index is 30.42 kg/(m^2). Tech Comments:  Patient took Cardizem this am. Irven Baltimore, RN.    Nuclear Med Study 1 or 2 day study: 1 day  Stress Test Type:  Carlton Adam  Reading MD: N/A  Order Authorizing Provider:  Ena Dawley, MD  Resting Radionuclide: Technetium 51m Sestamibi  Resting Radionuclide Dose: 11.0 mCi   Stress Radionuclide:  Technetium 66m Sestamibi  Stress Radionuclide Dose: 33.0 mCi           Stress Protocol Rest HR: 87 Stress HR: 106  Rest BP: 105/75 Stress BP: 111/64  Exercise Time (min): n/a METS: n/a   Predicted Max HR: 163 bpm % Max HR: 65.03 bpm Rate Pressure Product: 11766   Dose of Adenosine (mg):  n/a Dose of Lexiscan: 0.4 mg  Dose of Atropine (mg): n/a Dose of Dobutamine: n/a mcg/kg/min (at max HR)  Stress Test Technologist: Glade Lloyd, BS-ES  Nuclear Technologist:  Earl Many, CNMT     Rest Procedure:  Myocardial perfusion imaging was performed at rest 45 minutes following the intravenous administration of Technetium 13m Sestamibi. Rest ECG: Atrial Fibrilliation  Stress Procedure:  The patient received IV Lexiscan 0.4 mg over 15-seconds.  Technetium 57m Sestamibi injected at  30-seconds.  Quantitative spect images were obtained after a 45 minute delay.  During the infusion of Lexiscan the patient complained of SOB and feeling weird.  Stress ECG: No significant ST segment change suggestive of ischemia.  QPS Raw Data Images:  Normal; no motion artifact; normal heart/lung ratio. Stress Images:  There is decreased uptake in the apex. Rest Images:  There is decreased uptake in the apex. Subtraction (SDS):  No evidence of ischemia. Transient Ischemic Dilatation (Normal <1.22):  1.08 Lung/Heart Ratio (Normal <0.45):  0.30  Quantitative Gated Spect Images QGS EDV:  146 ml QGS ESV:  73 ml  Impression Exercise Capacity:  Lexiscan with no exercise. BP Response:  Normal blood pressure response. Clinical Symptoms:  Typical symptoms with Lexiscan. ECG Impression:  No significant ST segment change suggestive of ischemia. Comparison with Prior Nuclear Study: No images to compare  Overall Impression:  Low risk stress nuclear study With no evidence of ischemia identified. Mild apical thinning noted at both rest and stress..  LV Ejection Fraction: 50%.  LV Wall Motion:  NL LV Function; NL Wall Motion  Candee Furbish, MD

## 2014-07-14 ENCOUNTER — Telehealth: Payer: Self-pay | Admitting: Cardiology

## 2014-07-14 NOTE — Telephone Encounter (Signed)
Informed the pt that per Dr [name] his nuclear stress test results showed no prior infarct and no ischemia (blockages).  Pt reports he feels great today with no cardiac complaints.  Pt verbalized understanding of results and pleased with this news.

## 2014-07-14 NOTE — Telephone Encounter (Signed)
New Msg  Pt calling to check test results from yesterday. Please contact at [phone].

## 2014-07-19 ENCOUNTER — Ambulatory Visit: Payer: BC Managed Care – PPO | Admitting: Cardiology

## 2014-08-11 ENCOUNTER — Encounter: Payer: Self-pay | Admitting: Cardiology

## 2014-08-18 ENCOUNTER — Encounter: Payer: Self-pay | Admitting: Cardiology

## 2014-08-18 ENCOUNTER — Encounter: Payer: Self-pay | Admitting: *Deleted

## 2014-08-18 ENCOUNTER — Ambulatory Visit (INDEPENDENT_AMBULATORY_CARE_PROVIDER_SITE_OTHER): Payer: BLUE CROSS/BLUE SHIELD | Admitting: Cardiology

## 2014-08-18 VITALS — BP 122/70 | HR 90 | Ht 74.0 in | Wt 239.0 lb

## 2014-08-18 DIAGNOSIS — R0609 Other forms of dyspnea: Secondary | ICD-10-CM

## 2014-08-18 DIAGNOSIS — I4891 Unspecified atrial fibrillation: Secondary | ICD-10-CM

## 2014-08-18 DIAGNOSIS — Z0189 Encounter for other specified special examinations: Secondary | ICD-10-CM

## 2014-08-18 DIAGNOSIS — R0600 Dyspnea, unspecified: Secondary | ICD-10-CM

## 2014-08-18 DIAGNOSIS — I10 Essential (primary) hypertension: Secondary | ICD-10-CM

## 2014-08-18 MED ORDER — RIVAROXABAN 20 MG PO TABS: 20.0000 mg | ORAL_TABLET | Freq: Every day | ORAL | Status: DC

## 2014-08-18 MED ORDER — DRONEDARONE HCL 400 MG PO TABS: 400.0000 mg | ORAL_TABLET | Freq: Two times a day (BID) | ORAL | Status: DC

## 2014-08-18 NOTE — Progress Notes (Signed)
Patient ID: Jorge Kelly, male   DOB: [birth year], 59 y.o.   MRN: [id number] Patient ID: Jorge Kelly, male   DOB: 01/13/1956, 59 y.o.   MRN: [id number]     Patient Name: Jorge Kelly Date of Encounter: 08/18/2014  Primary Care Provider:  Chancy Hurter, MD Primary Cardiologist:  Dorothy Spark   Problem List   Past Medical History  Diagnosis Date  . Actinic keratosis 10/24/2009  . ALLERGIC RHINITIS 05/11/2007  . ELEVATED BLOOD PRESSURE WITHOUT DIAGNOSIS OF HYPERTENSION 11/12/2007  . GERD 05/11/2007  . MIGRAINE HEADACHE 05/11/2007  . SINUSITIS- ACUTE-NOS 05/10/2010  . TINEA CRURIS 10/24/2009  . Atrial fibrillation 05/2014   Past Surgical History  Procedure Laterality Date  . Ankle fracture surgery  2010    x2-left  . Upper gi endoscopy    . Malignant melanoma      removed from forehead in June 2015  . Strabismus surgery Right 05/21/2014    Procedure: REPAIR STRABISMUS RIGHT EYE;  Surgeon: Derry Skill, MD;  Location: [facility];  Service: Ophthalmology;  Laterality: Right;  . Cardioversion N/A 06/23/2014    Procedure: CARDIOVERSION;  Surgeon: Dorothy Spark, MD;  Location: [facility];  Service: Cardiovascular;  Laterality: N/A;    Allergies  No Known Allergies  HPI  Jorge Kelly is a 59 y.o. male with a history of GERD, esophageal stricture and GI bleed, malignant melanoma on his forehead s/p resection and no prior cardiac history who was admitted to [facility] on 05/21/2014 for an ophthalmologic procedure and found to be in atrial fibrillation with RVR post op. He was placed on IV Heparin and IV Diltiazem. Duration of AFib was not certain. It was felt that if he did not convert to NSR on his own, that he could go home on a NOAC with plans for DCCV in the next 3-4 weeks. CHADS2-VASc=0 but he was started on Xarelto. HR was better controlled on Diltiazem. Echocardiogram was performed and revealed normal systolic function with EF at 50-55%. He was seen by Dr.  Minus Breeding this AM and felt to be stable for DC to home. He was transitioned to oral Diltiazem. Heparin was transitioned to Xarelto.   The patient is coming after 3 weeks he is still in atrial fibrillation. He states that he felt fluttering in the past that he educated to reflux disease but now is questioning if it was really atrial fibrillation. He denies any syncope.  07/06/2014 - the patient felt great after DCCV, however only few days later developed palpitations again. No dizziness or syncope, but fatigue. He is physically active and has noticed worsening DOE, no chest pain. He attributed it to weight gain. He has significant family h/o premature CAD in multiple uncles.  08/21/2013 - the patient is coming back after 2 months, he is feeling great, just feeling cold all the time. No palpitations, presyncope or syncope, no SOB or chest pain.  Home Medications  Prior to Admission medications   Medication Sig Start Date End Date Taking? Authorizing Provider  diltiazem (CARDIZEM CD) 120 MG 24 hr capsule Take 1 capsule (120 mg total) by mouth daily. 05/22/14  Yes Liliane Shi, PA-C  rivaroxaban (XARELTO) 20 MG TABS tablet Take 1 tablet (20 mg total) by mouth daily with supper. 05/24/14  Yes Dorothy Spark, MD  omeprazole (PRILOSEC) 20 MG capsule Take 20 mg by mouth daily.    Historical Provider, MD  oxyCODONE-acetaminophen (PERCOCET) 7.5-325 MG per tablet Take  1 tablet by mouth every 4 (four) hours as needed for pain. 05/21/14   Derry Skill, MD  rizatriptan (MAXALT-MLT) 10 MG disintegrating tablet Take 10 mg by mouth as needed for migraine. May repeat in 2 hours if needed    Historical Provider, MD  tobramycin-dexamethasone [facility]) ophthalmic ointment Place 1 application into the right eye 2 (two) times daily. 05/21/14   Derry Skill, MD    Family History  Family History  Problem Relation Age of Onset  . Lymphoma Mother   . Atrial fibrillation Mother   . Leukemia Father      Social History  History   Social History  . Marital Status: Married    Spouse Name: N/A    Number of Children: N/A  . Years of Education: N/A   Occupational History  . Not on file.   Social History Main Topics  . Smoking status: Never Smoker   . Smokeless tobacco: Never Used  . Alcohol Use: Yes     Comment: rarely  . Drug Use: No  . Sexual Activity: Not on file   Other Topics Concern  . Not on file   Social History Narrative    Review of Systems, as per HPI, otherwise negative General:  No chills, fever, night sweats or weight changes.  Cardiovascular:  No chest pain, dyspnea on exertion, edema, orthopnea, palpitations, paroxysmal nocturnal dyspnea. Dermatological: No rash, lesions/masses Respiratory: No cough, dyspnea Urologic: No hematuria, dysuria Abdominal:   No nausea, vomiting, diarrhea, bright red blood per rectum, melena, or hematemesis Neurologic:  No visual changes, wkns, changes in mental status. All other systems reviewed and are otherwise negative except as noted above.  Physical Exam  Height 6\' 2"  (1.88 m), weight 239 lb (108.41 kg).  General: Pleasant, NAD Psych: Normal affect. Neuro: Alert and oriented X 3. Moves all extremities spontaneously. HEENT: Normal  Neck: Supple without bruits or JVD. Lungs:  Resp regular and unlabored, CTA. Heart: RRR no s3, s4, or murmurs. Abdomen: Soft, non-tender, non-distended, BS + x 4.  Extremities: No clubbing, cyanosis or edema. DP/PT/Radials 2+ and equal bilaterally.  Labs:  No results for input(s): CKTOTAL, CKMB, TROPONINI in the last 72 hours. Lab Results  Component Value Date   WBC 11.2* 05/22/2014   HGB 14.9 05/22/2014   HCT 42.9 05/22/2014   MCV 88.6 05/22/2014   PLT 159 05/22/2014    No results found for: DDIMER Invalid input(s): POCBNP    Component Value Date/Time   NA 138 05/22/2014 0330   K 4.1 05/22/2014 0330   CL 105 05/22/2014 0330   CO2 20 05/22/2014 0330   GLUCOSE 148* 05/22/2014  0330   BUN 20 05/22/2014 0330   CREATININE 0.90 05/22/2014 0330   CALCIUM 8.8 05/22/2014 0330   PROT 6.7 05/21/2014 1414   ALBUMIN 3.9 05/21/2014 1414   AST 14 05/21/2014 1414   ALT 18 05/21/2014 1414   ALKPHOS 59 05/21/2014 1414   BILITOT 1.0 05/21/2014 1414   GFRNONAA >90 05/22/2014 0330   GFRAA >90 05/22/2014 0330   Lab Results  Component Value Date   CHOL 171 05/22/2014   HDL 46 05/22/2014   LDLCALC 102* 05/22/2014   TRIG 117 05/22/2014    Accessory Clinical Findings  Echocardiogram - 05/22/2014 Left ventricle: The cavity size was normal. There was mild concentric hypertrophy. Systolic function was normal. The estimated ejection fraction was in the range of 50% to 55%. Wall motion was normal; there were no regional wall motion abnormalities. The  study was not technically sufficient to allow evaluation of LV diastolic dysfunction due to atrial fibrillation. - Aortic valve: Structurally normal valve. There was mild regurgitation. - Ascending aorta: The ascending aorta was normal in size. - Mitral valve: Structurally normal valve. There was mild regurgitation. - Left atrium: The atrium was moderately dilated. - Right ventricle: The cavity size was mildly dilated. Wall thickness was normal. Systolic function was normal. - Right atrium: The atrium was mildly dilated. - Tricuspid valve: There was mild regurgitation. - Pulmonic valve: There was no regurgitation. - Pulmonary arteries: Systolic pressure was within the normal range. - Inferior vena cava: The vessel was dilated. The respirophasic diameter changes were blunted (< 50%), consistent with elevated central venous pressure. - Pericardium, extracardiac: The pericardium was normal in appearance.  Impressions:  - Normal left ventricular size and function with mild concentric left ventricular hypertrophy. Moderately dilated left atrium, mildly dilated right atrium  and ventricle. Normal right ventricular systolic function. Mild aortic, mitral and tricuspid regurgitation. RVSP normal.  ECG - atrial fibrillation, 96 BPM  Nuclear stress test: 07/13/2014 Quantitative Gated Spect Images QGS EDV: 146 ml QGS ESV: 73 ml  Impression Exercise Capacity: Lexiscan with no exercise. BP Response: Normal blood pressure response. Clinical Symptoms: Typical symptoms with Lexiscan. ECG Impression: No significant ST segment change suggestive of ischemia. Comparison with Prior Nuclear Study: No images to compare  Overall Impression: Low risk stress nuclear study With no evidence of ischemia identified. Mild apical thinning noted at both rest and stress..  LV Ejection Fraction: 50%. LV Wall Motion: NL LV Function; NL Wall Motion   Assessment & Plan  59 year old gentleman with   1. Newly diagnosed atrial fibrillation just discharged from the hospital in 05/2014. His CHADS2-VASc=0 score is 0 but he was started on Xarelto for anticipation of cardioversion.  DCCV successful, however back in a-fib just few days post cardioversion. He was started on  Multaq 400 mg po BID along with decreased dose of cardizem 120 mg po daily. Continue xarelto. He is tolerating it well. No bleeding. He remains in a-fib but is now rate controlled.  We will schedule another trial of DCCV for 09/09/2014.  We will refer to Dr [name] for RFA consideration as his LA is moderately dilated already, however he wants to wait for now.    2. Progressively worsening DOE - A treadmill nuclear stress test was negative for prior inferct and ischemia. This will also be helpful if we need to initiate class I antiarrhytmics.  3. Hypertension - controlled with Cardizem  Follow up 1 month after the cardioversion.  Dorothy Spark, MD, [facility] 08/18/2014, 10:23 AM

## 2014-08-18 NOTE — Patient Instructions (Signed)
Your physician recommends that you continue on your current medications as directed. Please refer to the Current Medication list given to you today.    Your physician recommends that you return for lab work in: [place] February, PRIOR TO YOUR SCHEDULED CARDIOVERSION ON 09/09/14  (PT/INR, TSH, CMET, CBC W DIFF)  PREFERABLY ON Monday 09/06/14   Your physician has requested that you have Cardioversion.  Electrical Cardioversion uses a jolt of electricity to your heart either through paddles or wired patches attached to your chest. This is a controlled, usually prescheduled, procedure. This procedure is done at the hospital and you are not awake during the procedure. You usually go home the day of the procedure. Please see the instruction sheet given to you today for more information.  YOUR CARDIOVERSION IS SCHEDULED FOR 09/09/14 AT 12 PM.  PLEASE ARRIVE BY 10:30 AM AT [place]    Your physician recommends that you schedule a follow-up appointment in: [place]

## 2014-08-25 ENCOUNTER — Encounter: Payer: Self-pay | Admitting: Internal Medicine

## 2014-08-25 ENCOUNTER — Ambulatory Visit (INDEPENDENT_AMBULATORY_CARE_PROVIDER_SITE_OTHER): Payer: BLUE CROSS/BLUE SHIELD | Admitting: Internal Medicine

## 2014-08-25 VITALS — BP 114/82 | HR 89 | Ht 73.75 in | Wt 241.4 lb

## 2014-08-25 DIAGNOSIS — G4710 Hypersomnia, unspecified: Secondary | ICD-10-CM

## 2014-08-25 DIAGNOSIS — E663 Overweight: Secondary | ICD-10-CM | POA: Insufficient documentation

## 2014-08-25 DIAGNOSIS — R0683 Snoring: Secondary | ICD-10-CM

## 2014-08-25 DIAGNOSIS — I4891 Unspecified atrial fibrillation: Secondary | ICD-10-CM

## 2014-08-25 DIAGNOSIS — R400 Somnolence: Secondary | ICD-10-CM

## 2014-08-25 NOTE — Progress Notes (Signed)
Electrophysiology Office Note   Date:  08/25/2014   ID:  Jorge Kelly, DOB 09/28/1955, MRN [id number]  PCP:  Chancy Hurter, MD  Cardiologist:  Dr [name] Primary Electrophysiologist:  [name], MD    Chief Complaint  Patient presents with  . Atrial Fibrillation     History of Present Illness: Jorge Kelly is a 59 y.o. male who presents today for electrophysiology evaluation.   He was initially diagnosed with atrial fibrillation 05/21/14 after presenting for eye surgery.  He was initially unaware of his AF.  He was referred to Dr [name] for further evaluation.   He underwent cardioversion 11/15 after anticoagulation was initiated.  In retrospect, he now realizes symptoms of shortness of breath, dyspnea on stairs, and fatigue with AF.  His fatigue is more prominent later in the day.   He felt "much better" after cardioversion with more energy and better stamina.  Unfortunately, he quickly returned to AFib.   Echo revealed the LA was moderately enlarged.  He has been initiated on multaq with plans for repeat cardioversion in the next few weeks.  He snores and has not had prior sleep study.  Today, he denies symptoms of palpitations, chest pain,  orthopnea, PND, lower extremity edema, claudication, dizziness, presyncope, syncope, bleeding, or neurologic sequela. The patient is tolerating medications without difficulties and is otherwise without complaint today.    Past Medical History  Diagnosis Date  . Actinic keratosis 10/24/2009  . ALLERGIC RHINITIS 05/11/2007  . ELEVATED BLOOD PRESSURE WITHOUT DIAGNOSIS OF HYPERTENSION 11/12/2007  . GERD 05/11/2007  . MIGRAINE HEADACHE 05/11/2007  . SINUSITIS- ACUTE-NOS 05/10/2010  . TINEA CRURIS 10/24/2009  . Persistent atrial fibrillation 05/2014  . GI bleeding     20 years ago  . Esophageal stricture     treated with protonix  . Melanoma 01/2014    remoted from forehead  . Overweight   . Snores    Past Surgical History  Procedure  Laterality Date  . Ankle fracture surgery  2010    x2-left  . Upper gi endoscopy    . Malignant melanoma      removed from forehead in June 2015  . Strabismus surgery Right 05/21/2014    Procedure: REPAIR STRABISMUS RIGHT EYE;  Surgeon: Derry Skill, MD;  Location: [place] [facility];  Service: Ophthalmology;  Laterality: Right;  . Cardioversion N/A 06/23/2014    Procedure: CARDIOVERSION;  Surgeon: Dorothy Spark, MD;  Location: [facility] ENDOSCOPY;  Service: Cardiovascular;  Laterality: N/A;     Current Outpatient Prescriptions  Medication Sig Dispense Refill  . diltiazem (CARDIZEM CD) 120 MG 24 hr capsule Take 1 capsule (120 mg total) by mouth daily. 90 capsule 3  . dronedarone (MULTAQ) 400 MG tablet Take 1 tablet (400 mg total) by mouth 2 (two) times daily with a meal. 90 tablet 6  . omeprazole (PRILOSEC) 20 MG capsule Take 20 mg by mouth every other day.     . rivaroxaban (XARELTO) 20 MG TABS tablet Take 1 tablet (20 mg total) by mouth daily with supper. 30 tablet 6  . rizatriptan (MAXALT-MLT) 10 MG disintegrating tablet Take 10 mg by mouth daily as needed for migraine. May repeat in 2 hours if needed     No current facility-administered medications for this visit.    Allergies:   Review of patient's allergies indicates no known allergies.   Social History:  The patient  reports that he has never smoked. He has never used smokeless tobacco.  He reports that he drinks alcohol. He reports that he does not use illicit drugs.   Family History:  The patient's family history includes Atrial fibrillation in his mother; Leukemia in his father; Lymphoma in his mother.    ROS:  Please see the history of present illness.   All other systems are reviewed and negative.    PHYSICAL EXAM: VS:  BP 114/82 mmHg  Pulse 89  Ht 6' 1.75" (1.873 m)  Wt 241 lb 6.4 oz (109.498 kg)  BMI 31.21 kg/m2 , BMI Body mass index is 31.21 kg/(m^2). GEN: Well nourished, well developed, in no acute  distress HEENT: normal Neck: no JVD, carotid bruits, or masses Cardiac: iRRR; no murmurs, rubs, or gallops,no edema  Respiratory:  clear to auscultation bilaterally, normal work of breathing GI: soft, nontender, nondistended, + BS MS: no deformity or atrophy Skin: warm and dry  Neuro:  Strength and sensation are intact Psych: euthymic mood, full affect  EKG:  EKG is ordered today. The ekg ordered today shows afib, V rate 89 bpm, nonspecific ST/T changes   Recent Labs: 05/21/2014: ALT 18; Pro B Natriuretic peptide (BNP) 250.9*; TSH 0.870 05/22/2014: BUN 20; Creatinine 0.90; Hemoglobin 14.9; Platelets 159; Potassium 4.1; Sodium 138    Lipid Panel     Component Value Date/Time   CHOL 171 05/22/2014 0330   TRIG 117 05/22/2014 0330   HDL 46 05/22/2014 0330   CHOLHDL 3.7 05/22/2014 0330   VLDL 23 05/22/2014 0330   LDLCALC 102* 05/22/2014 0330   LDLDIRECT 96.1 09/02/2013 0839     Wt Readings from Last 3 Encounters:  08/25/14 241 lb 6.4 oz (109.498 kg)  08/18/14 239 lb (108.41 kg)  07/13/14 237 lb (107.502 kg)      Other studies Reviewed: Additional studies/ records that were reviewed today include: echo, GXT, Dr Thera Flake records  Review of the above records today demonstrates: preserved EF with moderate LA enlargement, normal stress test   ASSESSMENT AND PLAN:  1.  Persistent atrial fibrillation The patient has symptomatic afib.  He has failed previous cardioversion.  I suspect that given his moderate LA enlargement that his AF has been present much longer than we know.  I am also suspicious that he may have sleep apnea as an underlying cause. He has been initiated on multaq with plans of repeat cardioversion in the near future.  If he fails multaq therapy then I would consider flecainide as our next option followed by [place].  He may eventually be a candidate for ablation, however per AHA/ HRS guidelines for patients with persistent AF, we will first try medical  therapy.  Chads2vasc score is 0.  Continue xarelto while we pursue sinus rhythm.  2. Snoring Sleep study is ordered today.  3. Overweight Body mass index is 31.21 kg/(m^2). Weight loss is advised    Current medicines are reviewed at length with the patient today.   The patient does not have concerns regarding his medicines.  The following changes were made today:  none  Return to see me 4 weeks post cardioversion Follow-up with Dr [name] as scheduled   Signed, [name], MD  08/25/2014 10:50 AM     [facility] [address] [place] [address] [phone] (office) [phone] (fax)

## 2014-08-25 NOTE — Patient Instructions (Signed)
Your physician recommends that you schedule a follow-up appointment in: 6 weeks with Dr. [name]  You have been referred to Dr. Halford Chessman for Split-Night Sleep Study.  Your physician recommends that you continue on your current medications as directed. Please refer to the Current Medication list given to you today.

## 2014-09-06 ENCOUNTER — Other Ambulatory Visit (INDEPENDENT_AMBULATORY_CARE_PROVIDER_SITE_OTHER): Payer: BLUE CROSS/BLUE SHIELD | Admitting: *Deleted

## 2014-09-06 DIAGNOSIS — Z0189 Encounter for other specified special examinations: Secondary | ICD-10-CM

## 2014-09-06 DIAGNOSIS — I4891 Unspecified atrial fibrillation: Secondary | ICD-10-CM

## 2014-09-06 LAB — CBC WITH DIFFERENTIAL/PLATELET
Basophils Absolute: 0 10*3/uL (ref 0.0–0.1)
Basophils Relative: 0.5 % (ref 0.0–3.0)
Eosinophils Absolute: 0.2 10*3/uL (ref 0.0–0.7)
Eosinophils Relative: 3.2 % (ref 0.0–5.0)
HCT: 45.8 % (ref 39.0–52.0)
Hemoglobin: 15.7 g/dL (ref 13.0–17.0)
Lymphocytes Relative: 28.1 % (ref 12.0–46.0)
Lymphs Abs: 1.7 10*3/uL (ref 0.7–4.0)
MCHC: 34.2 g/dL (ref 30.0–36.0)
MCV: 90.1 fl (ref 78.0–100.0)
Monocytes Absolute: 0.3 10*3/uL (ref 0.1–1.0)
Monocytes Relative: 4.8 % (ref 3.0–12.0)
Neutro Abs: 3.8 10*3/uL (ref 1.4–7.7)
Neutrophils Relative %: 63.4 % (ref 43.0–77.0)
Platelets: 166 10*3/uL (ref 150.0–400.0)
RBC: 5.08 Mil/uL (ref 4.22–5.81)
RDW: 12.8 % (ref 11.5–15.5)
WBC: 6 10*3/uL (ref 4.0–10.5)

## 2014-09-06 LAB — COMPREHENSIVE METABOLIC PANEL
ALT: 17 U/L (ref 0–53)
AST: 12 U/L (ref 0–37)
Albumin: 4.2 g/dL (ref 3.5–5.2)
Alkaline Phosphatase: 63 U/L (ref 39–117)
BUN: 19 mg/dL (ref 6–23)
CO2: 24 mEq/L (ref 19–32)
Calcium: 9.1 mg/dL (ref 8.4–10.5)
Chloride: 109 mEq/L (ref 96–112)
Creatinine, Ser: 0.9 mg/dL (ref 0.40–1.50)
GFR: 92.36 mL/min (ref >60.00)
Glucose, Bld: 91 mg/dL (ref 70–99)
Potassium: 4.1 mEq/L (ref 3.5–5.1)
Sodium: 139 mEq/L (ref 135–145)
Total Bilirubin: 1 mg/dL (ref 0.2–1.2)
Total Protein: 6.6 g/dL (ref 6.0–8.3)

## 2014-09-06 LAB — PROTIME-INR
INR: 1.2 ratio — ABNORMAL HIGH (ref 0.8–1.0)
Prothrombin Time: 13.1 s (ref 9.6–13.1)

## 2014-09-06 LAB — TSH: TSH: 2.92 u[IU]/mL (ref 0.35–4.50)

## 2014-09-09 ENCOUNTER — Encounter (HOSPITAL_COMMUNITY): Payer: Self-pay | Admitting: *Deleted

## 2014-09-09 ENCOUNTER — Ambulatory Visit (HOSPITAL_COMMUNITY): Payer: BLUE CROSS/BLUE SHIELD | Admitting: Anesthesiology

## 2014-09-09 ENCOUNTER — Ambulatory Visit (HOSPITAL_COMMUNITY)
Admission: RE | Admit: 2014-09-09 | Discharge: 2014-09-09 | Disposition: A | Discharge status: Home / Self Care | Payer: BLUE CROSS/BLUE SHIELD | Source: Ambulatory Visit | Attending: Cardiology | Admitting: Cardiology

## 2014-09-09 ENCOUNTER — Encounter (HOSPITAL_COMMUNITY)
Admission: RE | Disposition: A | Discharge status: Home / Self Care | Payer: Self-pay | Source: Ambulatory Visit | Attending: Cardiology

## 2014-09-09 DIAGNOSIS — I4891 Unspecified atrial fibrillation: Secondary | ICD-10-CM | POA: Insufficient documentation

## 2014-09-09 DIAGNOSIS — E669 Obesity, unspecified: Secondary | ICD-10-CM | POA: Insufficient documentation

## 2014-09-09 DIAGNOSIS — Z8249 Family history of ischemic heart disease and other diseases of the circulatory system: Secondary | ICD-10-CM | POA: Diagnosis not present

## 2014-09-09 DIAGNOSIS — Z6831 Body mass index (BMI) 31.0-31.9, adult: Secondary | ICD-10-CM | POA: Diagnosis not present

## 2014-09-09 DIAGNOSIS — K219 Gastro-esophageal reflux disease without esophagitis: Secondary | ICD-10-CM | POA: Insufficient documentation

## 2014-09-09 DIAGNOSIS — I10 Essential (primary) hypertension: Secondary | ICD-10-CM | POA: Diagnosis not present

## 2014-09-09 HISTORY — PX: CARDIOVERSION: SHX1299

## 2014-09-09 SURGERY — CARDIOVERSION
Anesthesia: Monitor Anesthesia Care

## 2014-09-09 MED ORDER — PROPOFOL 10 MG/ML IV BOLUS
INTRAVENOUS | Status: DC | PRN
  Administered 2014-09-09: 100 mg via INTRAVENOUS

## 2014-09-09 MED ORDER — SODIUM CHLORIDE 0.9 % IV SOLN
INTRAVENOUS | Status: DC | PRN
  Administered 2014-09-09: 11:00:00 via INTRAVENOUS

## 2014-09-09 MED ORDER — LIDOCAINE HCL (CARDIAC) 20 MG/ML IV SOLN
INTRAVENOUS | Status: DC | PRN
  Administered 2014-09-09: 20 mg via INTRAVENOUS

## 2014-09-09 MED ORDER — SODIUM CHLORIDE 0.9 % IV SOLN
INTRAVENOUS | Status: DC
  Administered 2014-09-09: 11:00:00 via INTRAVENOUS

## 2014-09-09 NOTE — Transfer of Care (Signed)
Immediate Anesthesia Transfer of Care Note  Patient: Jorge Kelly  Procedure(s) Performed: Procedure(s): CARDIOVERSION (N/A)  Patient Location: PACU  Anesthesia Type:MAC  Level of Consciousness: awake, alert  and oriented  Airway & Oxygen Therapy: Patient Spontanous Breathing and Patient connected to nasal cannula oxygen  Post-op Assessment: Report given to RN, Post -op Vital signs reviewed and stable and Patient moving all extremities X 4  Post vital signs: Reviewed and stable  Last Vitals:  Filed Vitals:   09/09/14 1058  BP: 115/89  Temp: 37.1 C  Resp: 11    Complications: No apparent anesthesia complications

## 2014-09-09 NOTE — H&P (View-Only) (Signed)
Electrophysiology Office Note   Date:  08/25/2014   ID:  QUAYSHAWN NIN, DOB 02-14-1956, MRN [id number]  PCP:  Chancy Hurter, MD  Cardiologist:  Dr [name] Primary Electrophysiologist:  [name], MD    Chief Complaint  Patient presents with  . Atrial Fibrillation     History of Present Illness: Jorge Kelly is a 59 y.o. male who presents today for electrophysiology evaluation.   He was initially diagnosed with atrial fibrillation 05/21/14 after presenting for eye surgery.  He was initially unaware of his AF.  He was referred to Dr [name] for further evaluation.   He underwent cardioversion 11/15 after anticoagulation was initiated.  In retrospect, he now realizes symptoms of shortness of breath, dyspnea on stairs, and fatigue with AF.  His fatigue is more prominent later in the day.   He felt "much better" after cardioversion with more energy and better stamina.  Unfortunately, he quickly returned to AFib.   Echo revealed the LA was moderately enlarged.  He has been initiated on multaq with plans for repeat cardioversion in the next few weeks.  He snores and has not had prior sleep study.  Today, he denies symptoms of palpitations, chest pain,  orthopnea, PND, lower extremity edema, claudication, dizziness, presyncope, syncope, bleeding, or neurologic sequela. The patient is tolerating medications without difficulties and is otherwise without complaint today.    Past Medical History  Diagnosis Date  . Actinic keratosis 10/24/2009  . ALLERGIC RHINITIS 05/11/2007  . ELEVATED BLOOD PRESSURE WITHOUT DIAGNOSIS OF HYPERTENSION 11/12/2007  . GERD 05/11/2007  . MIGRAINE HEADACHE 05/11/2007  . SINUSITIS- ACUTE-NOS 05/10/2010  . TINEA CRURIS 10/24/2009  . Persistent atrial fibrillation 05/2014  . GI bleeding     20 years ago  . Esophageal stricture     treated with protonix  . Melanoma 01/2014    remoted from forehead  . Overweight   . Snores    Past Surgical History  Procedure  Laterality Date  . Ankle fracture surgery  2010    x2-left  . Upper gi endoscopy    . Malignant melanoma      removed from forehead in June 2015  . Strabismus surgery Right 05/21/2014    Procedure: REPAIR STRABISMUS RIGHT EYE;  Surgeon: Derry Skill, MD;  Location: [place];  Service: Ophthalmology;  Laterality: Right;  . Cardioversion N/A 06/23/2014    Procedure: CARDIOVERSION;  Surgeon: Dorothy Spark, MD;  Location: [facility];  Service: Cardiovascular;  Laterality: N/A;     Current Outpatient Prescriptions  Medication Sig Dispense Refill  . diltiazem (CARDIZEM CD) 120 MG 24 hr capsule Take 1 capsule (120 mg total) by mouth daily. 90 capsule 3  . dronedarone (MULTAQ) 400 MG tablet Take 1 tablet (400 mg total) by mouth 2 (two) times daily with a meal. 90 tablet 6  . omeprazole (PRILOSEC) 20 MG capsule Take 20 mg by mouth every other day.     . rivaroxaban (XARELTO) 20 MG TABS tablet Take 1 tablet (20 mg total) by mouth daily with supper. 30 tablet 6  . rizatriptan (MAXALT-MLT) 10 MG disintegrating tablet Take 10 mg by mouth daily as needed for migraine. May repeat in 2 hours if needed     No current facility-administered medications for this visit.    Allergies:   Review of patient's allergies indicates no known allergies.   Social History:  The patient  reports that he has never smoked. He has never used smokeless tobacco.  He reports that he drinks alcohol. He reports that he does not use illicit drugs.   Family History:  The patient's family history includes Atrial fibrillation in his mother; Leukemia in his father; Lymphoma in his mother.    ROS:  Please see the history of present illness.   All other systems are reviewed and negative.    PHYSICAL EXAM: VS:  BP 114/82 mmHg  Pulse 89  Ht 6' 1.75" (1.873 m)  Wt 241 lb 6.4 oz (109.498 kg)  BMI 31.21 kg/m2 , BMI Body mass index is 31.21 kg/(m^2). GEN: Well nourished, well developed, in no acute  distress HEENT: normal Neck: no JVD, carotid bruits, or masses Cardiac: iRRR; no murmurs, rubs, or gallops,no edema  Respiratory:  clear to auscultation bilaterally, normal work of breathing GI: soft, nontender, nondistended, + BS MS: no deformity or atrophy Skin: warm and dry  Neuro:  Strength and sensation are intact Psych: euthymic mood, full affect  EKG:  EKG is ordered today. The ekg ordered today shows afib, V rate 89 bpm, nonspecific ST/T changes   Recent Labs: 05/21/2014: ALT 18; Pro B Natriuretic peptide (BNP) 250.9*; TSH 0.870 05/22/2014: BUN 20; Creatinine 0.90; Hemoglobin 14.9; Platelets 159; Potassium 4.1; Sodium 138    Lipid Panel     Component Value Date/Time   CHOL 171 05/22/2014 0330   TRIG 117 05/22/2014 0330   HDL 46 05/22/2014 0330   CHOLHDL 3.7 05/22/2014 0330   VLDL 23 05/22/2014 0330   LDLCALC 102* 05/22/2014 0330   LDLDIRECT 96.1 09/02/2013 0839     Wt Readings from Last 3 Encounters:  08/25/14 241 lb 6.4 oz (109.498 kg)  08/18/14 239 lb (108.41 kg)  07/13/14 237 lb (107.502 kg)      Other studies Reviewed: Additional studies/ records that were reviewed today include: echo, GXT, Dr Thera Flake records  Review of the above records today demonstrates: preserved EF with moderate LA enlargement, normal stress test   ASSESSMENT AND PLAN:  1.  Persistent atrial fibrillation The patient has symptomatic afib.  He has failed previous cardioversion.  I suspect that given his moderate LA enlargement that his AF has been present much longer than we know.  I am also suspicious that he may have sleep apnea as an underlying cause. He has been initiated on multaq with plans of repeat cardioversion in the near future.  If he fails multaq therapy then I would consider flecainide as our next option followed by [place].  He may eventually be a candidate for ablation, however per AHA/ HRS guidelines for patients with persistent AF, we will first try medical  therapy.  Chads2vasc score is 0.  Continue xarelto while we pursue sinus rhythm.  2. Snoring Sleep study is ordered today.  3. Overweight Body mass index is 31.21 kg/(m^2). Weight loss is advised    Current medicines are reviewed at length with the patient today.   The patient does not have concerns regarding his medicines.  The following changes were made today:  none  Return to see me 4 weeks post cardioversion Follow-up with Dr [name] as scheduled   Signed, [name], MD  08/25/2014 10:50 AM     [facility] [address] [place] [address] [phone] (office) [phone] (fax)

## 2014-09-09 NOTE — CV Procedure (Addendum)
    Cardioversion Note  Jorge Kelly [id number] Dec 20, 1955  Procedure: DC Cardioversion Indications: atrial fibrillation   Procedure Details Consent: Obtained Time Out: Verified patient identification, verified procedure, site/side was marked, verified correct patient position, special equipment/implants available, Radiology Safety Procedures followed,  medications/allergies/relevent history reviewed, required imaging and test results available.  Performed  The patient has been on adequate anticoagulation.  The patient received 100 mg IV Propofol and 20 mg of iv Lidocain administered by anesthesia staff for sedation.  Synchronous cardioversion was performed at 200 joules.  The cardioversion was successful.   Complications: No apparent complications Patient did tolerate procedure well.   Dorothy Spark, MD, [facility] 09/09/2014, 11:00 AM

## 2014-09-09 NOTE — Anesthesia Preprocedure Evaluation (Addendum)
Anesthesia Evaluation  Patient identified by MRN, date of birth, ID band Patient awake    Reviewed: Allergy & Precautions, NPO status , Patient's Chart, lab work & pertinent test results, reviewed documented beta blocker date and time   History of Anesthesia Complications Negative for: history of anesthetic complications  Airway Mallampati: I  TM Distance: >3 FB Neck ROM: Full    Dental  (+) Teeth Intact, Dental Advidsory Given   Pulmonary shortness of breath and with exertion,  breath sounds clear to auscultation        Cardiovascular hypertension, Pt. on medications - angina+ DOE + dysrhythmias Atrial Fibrillation Rhythm:Irregular Rate:Normal  '15 ECHO: EF 50-55%, mild AI, mild MR, mild TR   Neuro/Psych  Headaches,    GI/Hepatic Neg liver ROS, GERD-  Medicated and Controlled,  Endo/Other  negative endocrine ROS  Renal/GU negative Renal ROS     Musculoskeletal   Abdominal (+) + obese,   Peds  Hematology  (+) Blood dyscrasia (Xarelto), ,   Anesthesia Other Findings   Reproductive/Obstetrics                         Anesthesia Physical Anesthesia Plan  ASA: III  Anesthesia Plan: General   Post-op Pain Management:    Induction: Intravenous  Airway Management Planned: Mask and Natural Airway  Additional Equipment:   Intra-op Plan:   Post-operative Plan:   Informed Consent: I have reviewed the patients History and Physical, chart, labs and discussed the procedure including the risks, benefits and alternatives for the proposed anesthesia with the patient or authorized representative who has indicated his/her understanding and acceptance.   Dental Advisory Given  Plan Discussed with: Anesthesiologist, CRNA and Surgeon  Anesthesia Plan Comments: (Plan routine monitors, GA for cardioversion )       Anesthesia Quick Evaluation

## 2014-09-09 NOTE — Anesthesia Postprocedure Evaluation (Signed)
  Anesthesia Post-op Note  Patient: Jorge Kelly  Procedure(s) Performed: Procedure(s): CARDIOVERSION (N/A)  Patient Location: PACU  Anesthesia Type:MAC  Level of Consciousness: awake, alert  and oriented  Airway and Oxygen Therapy: Patient Spontanous Breathing and Patient connected to nasal cannula oxygen  Post-op Pain: none  Post-op Assessment: Post-op Vital signs reviewed, Patient's Cardiovascular Status Stable, Respiratory Function Stable, Patent Airway, No signs of Nausea or vomiting, Adequate PO intake, Pain level controlled and Pain level not controlled  Post-op Vital Signs: Reviewed and stable  Last Vitals:  Filed Vitals:   09/09/14 1058  BP: 115/89  Temp: 37.1 C  Resp: 11    Complications: No apparent anesthesia complications

## 2014-09-09 NOTE — Interval H&P Note (Signed)
History and Physical Interval Note:  09/09/2014 10:59 AM  Jorge Kelly  has presented today for surgery, with the diagnosis of AFIB  The various methods of treatment have been discussed with the patient and family. After consideration of risks, benefits and other options for treatment, the patient has consented to  Procedure(s): CARDIOVERSION (N/A) as a surgical intervention .  The patient's history has been reviewed, patient examined, no change in status, stable for surgery.  I have reviewed the patient's chart and labs.  Questions were answered to the patient's satisfaction.     Dorothy Spark

## 2014-09-13 ENCOUNTER — Encounter (HOSPITAL_COMMUNITY): Payer: Self-pay | Admitting: Cardiology

## 2014-09-28 ENCOUNTER — Ambulatory Visit (INDEPENDENT_AMBULATORY_CARE_PROVIDER_SITE_OTHER): Payer: BLUE CROSS/BLUE SHIELD | Admitting: Cardiology

## 2014-09-28 ENCOUNTER — Encounter: Payer: Self-pay | Admitting: Cardiology

## 2014-09-28 VITALS — BP 118/74 | HR 86 | Ht 73.0 in | Wt 240.0 lb

## 2014-09-28 DIAGNOSIS — R0600 Dyspnea, unspecified: Secondary | ICD-10-CM

## 2014-09-28 DIAGNOSIS — I4891 Unspecified atrial fibrillation: Secondary | ICD-10-CM

## 2014-09-28 DIAGNOSIS — G4730 Sleep apnea, unspecified: Secondary | ICD-10-CM

## 2014-09-28 DIAGNOSIS — I10 Essential (primary) hypertension: Secondary | ICD-10-CM

## 2014-09-28 DIAGNOSIS — R0609 Other forms of dyspnea: Secondary | ICD-10-CM

## 2014-09-28 MED ORDER — FLECAINIDE ACETATE 50 MG PO TABS: 50.0000 mg | ORAL_TABLET | Freq: Two times a day (BID) | ORAL | Status: DC

## 2014-09-28 NOTE — Patient Instructions (Addendum)
Your physician has recommended you make the following change in your medication:   STOP TAKING [place]  START TAKING FLECAINIDE 50 MG TWICE DAILY---START TAKING THIS MEDICATION ON THIS Thursday 09/30/14      Your physician has requested that you have an exercise tolerance test. For further information please visit [email or web address]. Please also follow instruction sheet, as given. DR Laqueta Carina THIS SCHEDULED FOR NEXT Tuesday 10/05/14 AT [place] FLECAINIDE    Your physician recommends that you schedule a follow-up appointment in: [place]

## 2014-09-28 NOTE — Progress Notes (Signed)
Patient ID: Jorge Kelly, male   DOB: [birth year], 59 y.o.   MRN: [id number]    Patient Name: Jorge Kelly Date of Encounter: 09/28/2014  Primary Care Provider:  Chancy Hurter, MD Primary Cardiologist:  Dorothy Spark  Problem List   Past Medical History  Diagnosis Date  . Actinic keratosis 10/24/2009  . ALLERGIC RHINITIS 05/11/2007  . ELEVATED BLOOD PRESSURE WITHOUT DIAGNOSIS OF HYPERTENSION 11/12/2007  . GERD 05/11/2007  . MIGRAINE HEADACHE 05/11/2007  . SINUSITIS- ACUTE-NOS 05/10/2010  . TINEA CRURIS 10/24/2009  . Persistent atrial fibrillation 05/2014  . GI bleeding     20 years ago  . Esophageal stricture     treated with protonix  . Melanoma 01/2014    remoted from forehead  . Overweight   . Snores    Past Surgical History  Procedure Laterality Date  . Ankle fracture surgery  2010    x2-left  . Upper gi endoscopy    . Malignant melanoma      removed from forehead in June 2015  . Strabismus surgery Right 05/21/2014    Procedure: REPAIR STRABISMUS RIGHT EYE;  Surgeon: Derry Skill, MD;  Location: [place];  Service: Ophthalmology;  Laterality: Right;  . Cardioversion N/A 06/23/2014    Procedure: CARDIOVERSION;  Surgeon: Dorothy Spark, MD;  Location: [place];  Service: Cardiovascular;  Laterality: N/A;  . Cardioversion N/A 09/09/2014    Procedure: CARDIOVERSION;  Surgeon: Dorothy Spark, MD;  Location: [place];  Service: Cardiovascular;  Laterality: N/A;    Allergies  No Known Allergies  HPI  Jorge Kelly is a 59 y.o. male with a history of GERD, esophageal stricture and GI bleed, malignant melanoma on his forehead s/p resection and no prior cardiac history who was admitted to [facility] on 05/21/2014 for an ophthalmologic procedure and found to be in atrial fibrillation with RVR post op. He was placed on IV Heparin and IV Diltiazem. Duration of AFib was not certain. It was felt that if he did not convert to NSR on his own, that he  could go home on a NOAC with plans for DCCV in the next 3-4 weeks. CHADS2-VASc=0 but he was started on Xarelto. HR was better controlled on Diltiazem. Echocardiogram was performed and revealed normal systolic function with EF at 50-55%. He was seen by Dr. Minus Breeding this AM and felt to be stable for DC to home. He was transitioned to oral Diltiazem. Heparin was transitioned to Xarelto.   The patient is coming after 3 weeks he is still in atrial fibrillation. He states that he felt fluttering in the past that he educated to reflux disease but now is questioning if it was really atrial fibrillation. He denies any syncope.  07/06/2014 - the patient felt great after DCCV, however only few days later developed palpitations again. No dizziness or syncope, but fatigue. He is physically active and has noticed worsening DOE, no chest pain. He attributed it to weight gain. He has significant family h/o premature CAD in multiple uncles.  08/21/2013 - the patient is coming back after 2 months, he is feeling great, just feeling cold all the time. No palpitations, presyncope or syncope, no SOB or chest pain.  09/28/2014 - the patient was seen 1 months ago when he was found to be back in atrial fibrillation, he underwent DC cardioversion on 09/09/2014, the patient is coming 3 weeks post cardioversion, he states that he feels great and his energy is back. He  denies any palpitations or syncope no lower extremity edema no chest pain or shortness of breath. He has been compliant with his medicine unfortunately his back in atrial fibrillation.  Home Medications  Prior to Admission medications   Medication Sig Start Date End Date Taking? Authorizing Provider  diltiazem (CARDIZEM CD) 120 MG 24 hr capsule Take 1 capsule (120 mg total) by mouth daily. 05/22/14  Yes Liliane Shi, PA-C  rivaroxaban (XARELTO) 20 MG TABS tablet Take 1 tablet (20 mg total) by mouth daily with supper. 05/24/14  Yes Dorothy Spark, MD    omeprazole (PRILOSEC) 20 MG capsule Take 20 mg by mouth daily.    Historical Provider, MD  oxyCODONE-acetaminophen (PERCOCET) 7.5-325 MG per tablet Take 1 tablet by mouth every 4 (four) hours as needed for pain. 05/21/14   Derry Skill, MD  rizatriptan (MAXALT-MLT) 10 MG disintegrating tablet Take 10 mg by mouth as needed for migraine. May repeat in 2 hours if needed    Historical Provider, MD  tobramycin-dexamethasone [facility]) ophthalmic ointment Place 1 application into the right eye 2 (two) times daily. 05/21/14   Derry Skill, MD    Family History  Family History  Problem Relation Age of Onset  . Lymphoma Mother   . Atrial fibrillation Mother   . Leukemia Father    Social History  History   Social History  . Marital Status: Married    Spouse Name: N/A  . Number of Children: N/A  . Years of Education: N/A   Occupational History  . Not on file.   Social History Main Topics  . Smoking status: Never Smoker   . Smokeless tobacco: Never Used  . Alcohol Use: Yes     Comment: rarely  . Drug Use: No  . Sexual Activity: Not on file   Other Topics Concern  . Not on file   Social History Narrative   Lives in [place] with wife.  2 sons.   Trial lawyer.    Review of Systems, as per HPI, otherwise negative General:  No chills, fever, night sweats or weight changes.  Cardiovascular:  No chest pain, dyspnea on exertion, edema, orthopnea, palpitations, paroxysmal nocturnal dyspnea. Dermatological: No rash, lesions/masses Respiratory: No cough, dyspnea Urologic: No hematuria, dysuria Abdominal:   No nausea, vomiting, diarrhea, bright red blood per rectum, melena, or hematemesis Neurologic:  No visual changes, wkns, changes in mental status. All other systems reviewed and are otherwise negative except as noted above.  Physical Exam  Blood pressure 118/74, pulse 86, height 6\' 1"  (1.854 m), weight 240 lb (108.863 kg).  General: Pleasant, NAD Psych: Normal  affect. Neuro: Alert and oriented X 3. Moves all extremities spontaneously. HEENT: Normal  Neck: Supple without bruits or JVD. Lungs:  Resp regular and unlabored, CTA. Heart: RRR no s3, s4, or murmurs. Abdomen: Soft, non-tender, non-distended, BS + x 4.  Extremities: No clubbing, cyanosis or edema. DP/PT/Radials 2+ and equal bilaterally.  Labs:  No results for input(s): CKTOTAL, CKMB, TROPONINI in the last 72 hours. Lab Results  Component Value Date   WBC 6.0 09/06/2014   HGB 15.7 09/06/2014   HCT 45.8 09/06/2014   MCV 90.1 09/06/2014   PLT 166.0 09/06/2014    No results found for: DDIMER Invalid input(s): POCBNP    Component Value Date/Time   NA 139 09/06/2014 0837   K 4.1 09/06/2014 0837   CL 109 09/06/2014 0837   CO2 24 09/06/2014 0837   GLUCOSE 91 09/06/2014 0837  BUN 19 09/06/2014 0837   CREATININE 0.90 09/06/2014 0837   CALCIUM 9.1 09/06/2014 0837   PROT 6.6 09/06/2014 0837   ALBUMIN 4.2 09/06/2014 0837   AST 12 09/06/2014 0837   ALT 17 09/06/2014 0837   ALKPHOS 63 09/06/2014 0837   BILITOT 1.0 09/06/2014 0837   GFRNONAA >90 05/22/2014 0330   GFRAA >90 05/22/2014 0330   Lab Results  Component Value Date   CHOL 171 05/22/2014   HDL 46 05/22/2014   LDLCALC 102* 05/22/2014   TRIG 117 05/22/2014    Accessory Clinical Findings  Echocardiogram - 05/22/2014 Left ventricle: The cavity size was normal. There was mild concentric hypertrophy. Systolic function was normal. The estimated ejection fraction was in the range of 50% to 55%. Wall motion was normal; there were no regional wall motion abnormalities. The study was not technically sufficient to allow evaluation of LV diastolic dysfunction due to atrial fibrillation. - Aortic valve: Structurally normal valve. There was mild regurgitation. - Ascending aorta: The ascending aorta was normal in size. - Mitral valve: Structurally normal valve. There was mild regurgitation. - Left atrium: The  atrium was moderately dilated. - Right ventricle: The cavity size was mildly dilated. Wall thickness was normal. Systolic function was normal. - Right atrium: The atrium was mildly dilated. - Tricuspid valve: There was mild regurgitation. - Pulmonic valve: There was no regurgitation. - Pulmonary arteries: Systolic pressure was within the normal range. - Inferior vena cava: The vessel was dilated. The respirophasic diameter changes were blunted (< 50%), consistent with elevated central venous pressure. - Pericardium, extracardiac: The pericardium was normal in appearance.  Impressions:  - Normal left ventricular size and function with mild concentric left ventricular hypertrophy. Moderately dilated left atrium, mildly dilated right atrium and ventricle. Normal right ventricular systolic function. Mild aortic, mitral and tricuspid regurgitation. RVSP normal.  ECG - atrial fibrillation, 96 BPM  Nuclear stress test: 07/13/2014 Quantitative Gated Spect Images QGS EDV: 146 ml QGS ESV: 73 ml  Impression Exercise Capacity: Lexiscan with no exercise. BP Response: Normal blood pressure response. Clinical Symptoms: Typical symptoms with Lexiscan. ECG Impression: No significant ST segment change suggestive of ischemia. Comparison with Prior Nuclear Study: No images to compare  Overall Impression: Low risk stress nuclear study With no evidence of ischemia identified. Mild apical thinning noted at both rest and stress..  LV Ejection Fraction: 50%. LV Wall Motion: NL LV Function; NL Wall Motion   Assessment & Plan  59 year old gentleman with   1. Newly diagnosed atrial fibrillation diagnosed in October 2015. His CHADS2-VASc=0 score is 0 but he was started on Xarelto for anticipation of cardioversion. The patient underwent 2 cardioversions the last one on 09/09/2014 after loading with Multaq. Again these cardioversion was not successful in his back in  atrial fibrillation. In the meantime the patient saw Dr. [name] who agrees that that should be flecainide followed by Tikosyn load. We are still hoping to restore sinus rhythm however left atrial size is moderately increased. We'll start the patient on flecainide 50 mg by mouth twice a day considering he had negative stress test in the past. We'll perform exercise treadmill stress test within 5 days from flecainide initiation for evaluation of QRS widening and PR interval widening. We will discontinue Multaq. The patient is also losing weight so far lost 19 pounds and wants to lose another 15. He is encouraged to do so. He is also undergoing evaluation for sleep apnea.  2. Progressively worsening DOE - A treadmill nuclear stress  test was negative for prior inferct and ischemia. This will also be helpful if we need to initiate class I antiarrhytmics.  3. Hypertension - controlled with Cardizem  Follow up 1 month after the cardioversion.  Dorothy Spark, MD, [facility] 09/28/2014, 9:12 AM

## 2014-10-04 ENCOUNTER — Encounter: Payer: Self-pay | Admitting: Pulmonary Disease

## 2014-10-04 ENCOUNTER — Ambulatory Visit (INDEPENDENT_AMBULATORY_CARE_PROVIDER_SITE_OTHER): Payer: BLUE CROSS/BLUE SHIELD | Admitting: Pulmonary Disease

## 2014-10-04 VITALS — BP 132/84 | HR 103 | Temp 97.7°F | Ht 74.0 in | Wt 230.0 lb

## 2014-10-04 DIAGNOSIS — R0683 Snoring: Secondary | ICD-10-CM

## 2014-10-04 NOTE — Progress Notes (Deleted)
   Subjective:    Patient ID: Jorge Kelly, male    DOB: Aug 08, 1955, 59 y.o.   MRN: [id number]  HPI    Review of Systems  Constitutional: Negative for fever and unexpected weight change.  HENT: Negative for congestion, dental problem, ear pain, nosebleeds, postnasal drip, rhinorrhea, sinus pressure, sneezing, sore throat and trouble swallowing.   Eyes: Negative for redness and itching.  Respiratory: Positive for shortness of breath. Negative for cough, chest tightness and wheezing.   Cardiovascular: Negative for palpitations ( irregular heartbeat) and leg swelling.  Gastrointestinal: Negative for nausea and vomiting.  Genitourinary: Negative for dysuria.  Musculoskeletal: Negative for joint swelling.  Skin: Negative for rash.  Neurological: Negative for headaches.  Hematological: Does not bruise/bleed easily.  Psychiatric/Behavioral: Negative for dysphoric mood. The patient is not nervous/anxious.        Objective:   Physical Exam        Assessment & Plan:

## 2014-10-04 NOTE — Progress Notes (Signed)
Chief Complaint  Patient presents with  . Sleep Consult    Referred by Dr [name]. Epworth Score:3    History of Present Illness: Jorge Kelly is a 59 y.o. male for evaluation of sleep problems.  He is followed by cardiology for refractory atrial fibrillation.  There was concern he could have sleep apnea contributing to this.  He has always snored.  His wife was worried that he would stop breathing while asleep.  He would wake up every few hours because he felt like he couldn't breath, and his sinuses would feel congested.  He would then go to the bathroom.  He has lost about 20 lbs recent.  His sleep pattern has improved some, but he is still having problems.  He will fall asleep sometimes while watching TV.  He goes to sleep at 10 to 11pm.  He falls asleep after few minutes.  He wakes up 1 or 2 times to use the bathroom.  He gets out of bed 5 or 6 am.  He feels tired sometimes in the morning.  He denies morning headache.  He does not use anything to help him fall sleep or stay awake.  He infrequently dreams.  He denies sleep walking, sleep talking, bruxism, or nightmares.  There is no history of restless legs.  He denies sleep hallucinations, sleep paralysis, or cataplexy.  The Epworth score is 3 out of 24.  Tests: Echo 05/22/14 >> EF 50 to 55%, mod LA dilation  Jorge Kelly  has a past medical history of Actinic keratosis (10/24/2009); ALLERGIC RHINITIS (05/11/2007); ELEVATED BLOOD PRESSURE WITHOUT DIAGNOSIS OF HYPERTENSION (11/12/2007); GERD (05/11/2007); MIGRAINE HEADACHE (05/11/2007); SINUSITIS- ACUTE-NOS (05/10/2010); TINEA CRURIS (10/24/2009); Persistent atrial fibrillation (05/2014); GI bleeding; Esophageal stricture; Melanoma (01/2014); Overweight; and Snores.  Jorge Kelly  has past surgical history that includes Ankle fracture surgery (2010); Upper gi endoscopy; malignant melanoma; Strabismus surgery (Right, 05/21/2014); Cardioversion (N/A, 06/23/2014); and Cardioversion (N/A,  09/09/2014).  Prior to Admission medications   Medication Sig Start Date End Date Taking? Authorizing Provider  diltiazem (CARDIZEM CD) 120 MG 24 hr capsule Take 1 capsule (120 mg total) by mouth daily. 07/06/14  Yes Dorothy Spark, MD  flecainide (TAMBOCOR) 50 MG tablet Take 1 tablet (50 mg total) by mouth 2 (two) times daily. 09/28/14  Yes Dorothy Spark, MD  omeprazole (PRILOSEC) 20 MG capsule Take 20 mg by mouth every other day.    Yes Historical Provider, MD  rivaroxaban (XARELTO) 20 MG TABS tablet Take 1 tablet (20 mg total) by mouth daily with supper. 08/18/14  Yes Dorothy Spark, MD  rizatriptan (MAXALT-MLT) 10 MG disintegrating tablet Take 10 mg by mouth daily as needed for migraine. May repeat in 2 hours if needed   Yes Historical Provider, MD    Allergies  Allergen Reactions  . Asa [Aspirin]     GI bleed in past    His family history includes Atrial fibrillation in his mother; Heart disease in his maternal uncle; Leukemia in his father; Lymphoma in his mother.  He  reports that he has never smoked. He has never used smokeless tobacco. He reports that he does not drink alcohol or use illicit drugs.  Review of Systems  Constitutional: Negative for fever and unexpected weight change.  HENT: Negative for congestion, dental problem, ear pain, nosebleeds, postnasal drip, rhinorrhea, sinus pressure, sneezing, sore throat and trouble swallowing.   Eyes: Negative for redness and itching.  Respiratory: Positive for shortness of breath. Negative for  cough, chest tightness and wheezing.   Cardiovascular: Negative for palpitations ( irregular heartbeat) and leg swelling.  Gastrointestinal: Negative for nausea and vomiting.  Genitourinary: Negative for dysuria.  Musculoskeletal: Negative for joint swelling.  Skin: Negative for rash.  Neurological: Negative for headaches.  Hematological: Does not bruise/bleed easily.  Psychiatric/Behavioral: Negative for dysphoric mood. The patient  is not nervous/anxious.    Physical Exam: Blood pressure 132/84, pulse 103, temperature 97.7 F (36.5 C), temperature source Oral, height 6\' 2"  (1.88 m), weight 230 lb (104.327 kg), SpO2 96 %.  Body mass index is 29.52 kg/(m^2).  General - No distress ENT - No sinus tenderness, no oral exudate, no LAN, no thyromegaly, TM clear, pupils equal/reactive, MP 2, elongated uvula, narrow nasal angles, slight overbite, click in Rt TMJ Cardiac - irrregular, no murmur, pulses symmetric Chest - No wheeze/rales/dullness, good air entry, normal respiratory excursion Back - No focal tenderness Abd - Soft, non-tender, no organomegaly, + bowel sounds Ext - No edema Neuro - Normal strength, cranial nerves intact Skin - No rashes Psych - Normal mood, and behavior  Discussion: He has snoring, sleep disruption, witnessed apnea, and daytime sleepiness.  He has history of refractory atrial fibrillation.  I am concerned that he could have obstructive sleep apnea.  We discussed how sleep apnea can affect various health problems including risks for hypertension, cardiovascular disease, and diabetes.  We also discussed how sleep disruption can increase risks for accident, such as while driving.  Weight loss as a means of improving sleep apnea was also reviewed.  Additional treatment options discussed were CPAP therapy, oral appliance, and surgical intervention.  Assessment/plan:  Snoring. Plan: - will arrange for in lab sleep study - encouraged him to continue his weight loss efforts  Atrial fibrillation. Plan: - he is to follow up with cardiology  Allergic rhinitis. Plan: - prn OTC meds   Chesley Mires, M.D. Pager [phone]

## 2014-10-04 NOTE — Patient Instructions (Signed)
Will arrange for sleep study Will call to arrange for follow up after sleep study reviewed 

## 2014-10-05 ENCOUNTER — Other Ambulatory Visit: Payer: Self-pay

## 2014-10-05 ENCOUNTER — Ambulatory Visit (HOSPITAL_COMMUNITY)
Admission: RE | Admit: 2014-10-05 | Discharge: 2014-10-05 | Disposition: A | Discharge status: Home / Self Care | Payer: BLUE CROSS/BLUE SHIELD | Source: Ambulatory Visit | Attending: Cardiology | Admitting: Cardiology

## 2014-10-05 DIAGNOSIS — I4891 Unspecified atrial fibrillation: Secondary | ICD-10-CM

## 2014-10-06 ENCOUNTER — Ambulatory Visit (INDEPENDENT_AMBULATORY_CARE_PROVIDER_SITE_OTHER): Payer: BLUE CROSS/BLUE SHIELD | Admitting: Internal Medicine

## 2014-10-06 ENCOUNTER — Encounter: Payer: Self-pay | Admitting: Internal Medicine

## 2014-10-06 VITALS — BP 100/80 | HR 93 | Ht 74.0 in | Wt 231.8 lb

## 2014-10-06 DIAGNOSIS — I4891 Unspecified atrial fibrillation: Secondary | ICD-10-CM

## 2014-10-06 DIAGNOSIS — I10 Essential (primary) hypertension: Secondary | ICD-10-CM

## 2014-10-06 DIAGNOSIS — R0683 Snoring: Secondary | ICD-10-CM

## 2014-10-06 MED ORDER — FLECAINIDE ACETATE 100 MG PO TABS: 100.0000 mg | ORAL_TABLET | Freq: Two times a day (BID) | ORAL | Status: DC

## 2014-10-06 NOTE — Patient Instructions (Signed)
Your physician has recommended you make the following change in your medication:  1)increase flecainide to 100mg  twice a day  Your physician recommends that you schedule a follow-up appointment in: 1 week with Roderic Palau, NP

## 2014-10-06 NOTE — Progress Notes (Signed)
Electrophysiology Office Note   Date:  10/06/2014   ID:  RACER QUAM, DOB March 29, 1956, MRN [id number]  PCP:  Chancy Hurter, MD  Cardiologist:  Dr [name] Primary Electrophysiologist: [name], MD    Chief Complaint  Patient presents with  . Follow-up    AFIB RVR     History of Present Illness: Jorge Kelly is a 59 y.o. male who presents today for electrophysiology evaluation.   The patient returns for further afib evaluation.  He recently underwent cardioversion on multaq.  He maintained sinus for several days during which he felt "much better".  His exercise tolerance was improved and fatigue was less.  He quickly returned to afib.  He has been placed on flecainide 50mg  BID by Dr [name] but remains in afib.  He is tolerating flecainide without symptoms.  He has a sleep study pending. He is making progress with weight loss  Today, he denies symptoms of palpitations, chest pain, shortness of breath, orthopnea, PND, lower extremity edema, claudication, dizziness, presyncope, syncope, bleeding, or neurologic sequela. The patient is tolerating medications without difficulties and is otherwise without complaint today.    Past Medical History  Diagnosis Date  . Actinic keratosis 10/24/2009  . ALLERGIC RHINITIS 05/11/2007  . ELEVATED BLOOD PRESSURE WITHOUT DIAGNOSIS OF HYPERTENSION 11/12/2007  . GERD 05/11/2007  . MIGRAINE HEADACHE 05/11/2007  . SINUSITIS- ACUTE-NOS 05/10/2010  . TINEA CRURIS 10/24/2009  . Persistent atrial fibrillation 05/2014  . GI bleeding     20 years ago  . Esophageal stricture     treated with protonix  . Melanoma 01/2014    remoted from forehead  . Overweight   . Snores    Past Surgical History  Procedure Laterality Date  . Ankle fracture surgery  2010    x2-left  . Upper gi endoscopy    . Malignant melanoma      removed from forehead in June 2015  . Strabismus surgery Right 05/21/2014    Procedure: REPAIR STRABISMUS RIGHT EYE;  Surgeon: Derry Skill, MD;  Location: [facility];  Service: Ophthalmology;  Laterality: Right;  . Cardioversion N/A 06/23/2014    Procedure: CARDIOVERSION;  Surgeon: Dorothy Spark, MD;  Location: [facility];  Service: Cardiovascular;  Laterality: N/A;  . Cardioversion N/A 09/09/2014    Procedure: CARDIOVERSION;  Surgeon: Dorothy Spark, MD;  Location: [facility] ENDOSCOPY;  Service: Cardiovascular;  Laterality: N/A;     Current Outpatient Prescriptions  Medication Sig Dispense Refill  . diltiazem (CARDIZEM CD) 120 MG 24 hr capsule Take 1 capsule (120 mg total) by mouth daily. 90 capsule 3  . flecainide (TAMBOCOR) 100 MG tablet Take 1 tablet (100 mg total) by mouth 2 (two) times daily. 60 tablet 6  . omeprazole (PRILOSEC) 20 MG capsule Take 20 mg by mouth every other day.     . rivaroxaban (XARELTO) 20 MG TABS tablet Take 1 tablet (20 mg total) by mouth daily with supper. 30 tablet 6  . rizatriptan (MAXALT-MLT) 10 MG disintegrating tablet Take 10 mg by mouth daily as needed for migraine. May repeat in 2 hours if needed     No current facility-administered medications for this visit.    Allergies:   Asa   Social History:  The patient  reports that he has never smoked. He has never used smokeless tobacco. He reports that he does not drink alcohol or use illicit drugs.   Family History:  The patient's  family history includes Atrial fibrillation  in his mother; Heart disease in his maternal uncle; Leukemia in his father; Lymphoma in his mother.    ROS:  Please see the history of present illness.   All other systems are reviewed and negative.    PHYSICAL EXAM: VS:  BP 100/80 mmHg  Pulse 93  Ht 6\' 2"  (1.88 m)  Wt 231 lb 12.8 oz (105.144 kg)  BMI 29.75 kg/m2 , BMI Body mass index is 29.75 kg/(m^2). GEN: Well nourished, well developed, in no acute distress HEENT: normal Neck: no JVD, carotid bruits, or masses Cardiac: iRRR; no murmurs, rubs, or gallops,no edema  Respiratory:  clear to  auscultation bilaterally, normal work of breathing GI: soft, nontender, nondistended, + BS MS: no deformity or atrophy Skin: warm and dry  Neuro:  Strength and sensation are intact Psych: euthymic mood, full affect  EKG:  EKG is ordered today. The ekg ordered today shows afib V rate 93 bpm, nonspecific St/T changes   Recent Labs: 05/21/2014: Pro B Natriuretic peptide (BNP) 250.9* 09/06/2014: ALT 17; BUN 19; Creatinine 0.90; Hemoglobin 15.7; Platelets 166.0; Potassium 4.1; Sodium 139; TSH 2.92    Lipid Panel     Component Value Date/Time   CHOL 171 05/22/2014 0330   TRIG 117 05/22/2014 0330   HDL 46 05/22/2014 0330   CHOLHDL 3.7 05/22/2014 0330   VLDL 23 05/22/2014 0330   LDLCALC 102* 05/22/2014 0330   LDLDIRECT 96.1 09/02/2013 0839     Wt Readings from Last 3 Encounters:  10/06/14 231 lb 12.8 oz (105.144 kg)  10/04/14 230 lb (104.327 kg)  09/28/14 240 lb (108.863 kg)      Other studies Reviewed: Additional studies/ records that were reviewed today include: Dr Francesca Oman and Dr Juanetta Gosling notes   ASSESSMENT AND PLAN:  1.  persistent atrial fibrillation Remains in afib.  Has failed medical therapy with multaq. Will increase flecainide to 100mg  BID today.  Return for an ekg in the AF clinic in 1 week.  Proceed with cardioversion if still in AF Continue anticoagulation If he does not maintain sinus rhythm with flecainide then our options would be tikosyn or ablation  2. Snoring Sleep study is planned Dr Collie Siad notes reviewed  3. Obesity Weight loss encouraged   Current medicines are reviewed at length with the patient today.   The patient does not have concerns regarding his medicines.   Follow-up:  Return to the AF clinic in 1 week,  Follow-up with Dr [name] as scheduled  Signed, [name], MD  10/06/2014 10:24 PM     [facility] [address] [place]  [address] [phone] (office) [phone] (fax)

## 2014-10-26 NOTE — Progress Notes (Signed)
Patient ID: Jorge Kelly, male   DOB: [birth year], 59 y.o.   MRN: [id number]    Electrophysiology Office Note   Date:  10/26/2014   ID:  Jorge Kelly, DOB June 13, 1956, MRN [id number]  PCP:  Chancy Hurter, MD  Cardiologist:  Dr [name] Primary Electrophysiologist: Dorothy Spark, MD    CC: a-fib   History of Present Illness: Jorge Kelly is a 59 y.o. male who presents today for electrophysiology evaluation.   The patient returns for further afib evaluation.  He recently underwent cardioversion on multaq.  He maintained sinus for several days during which he felt "much better".  His exercise tolerance was improved and fatigue was less.  He quickly returned to afib.  He has been placed on flecainide 50mg  BID, that was increased to 100 mg po BID by Dr [name] on 10/06/2014. He was in a-fib at that visit. He is tolerating flecainide without symptoms.  He has a sleep study pending. He is making progress with weight loss  10/27/2014 - The patient feels great, he denies any palpitations or syncope. He keeps exercising on a daily basis and lost overall 30 lbs. His sleep improved significantly. He has no lower extremity edema no chest pain or shortness of breath. He has been compliant with his medicine unfortunately still in atrial fibrillation.Today, he denies symptoms of palpitations, chest pain, shortness of breath, orthopnea, PND, lower extremity edema, claudication, dizziness, presyncope, syncope, bleeding, or neurologic sequela. The patient is tolerating medications without difficulties and is otherwise without complaint today.    Past Medical History  Diagnosis Date  . Actinic keratosis 10/24/2009  . ALLERGIC RHINITIS 05/11/2007  . ELEVATED BLOOD PRESSURE WITHOUT DIAGNOSIS OF HYPERTENSION 11/12/2007  . GERD 05/11/2007  . MIGRAINE HEADACHE 05/11/2007  . SINUSITIS- ACUTE-NOS 05/10/2010  . TINEA CRURIS 10/24/2009  . Persistent atrial fibrillation 05/2014  . GI bleeding     20 years ago  . Esophageal  stricture     treated with protonix  . Melanoma 01/2014    remoted from forehead  . Overweight   . Snores    Past Surgical History  Procedure Laterality Date  . Ankle fracture surgery  2010    x2-left  . Upper gi endoscopy    . Malignant melanoma      removed from forehead in June 2015  . Strabismus surgery Right 05/21/2014    Procedure: REPAIR STRABISMUS RIGHT EYE;  Surgeon: Derry Skill, MD;  Location: [facility];  Service: Ophthalmology;  Laterality: Right;  . Cardioversion N/A 06/23/2014    Procedure: CARDIOVERSION;  Surgeon: Dorothy Spark, MD;  Location: [facility];  Service: Cardiovascular;  Laterality: N/A;  . Cardioversion N/A 09/09/2014    Procedure: CARDIOVERSION;  Surgeon: Dorothy Spark, MD;  Location: [facility] ENDOSCOPY;  Service: Cardiovascular;  Laterality: N/A;     Current Outpatient Prescriptions  Medication Sig Dispense Refill  . diltiazem (CARDIZEM CD) 120 MG 24 hr capsule Take 1 capsule (120 mg total) by mouth daily. 90 capsule 3  . flecainide (TAMBOCOR) 100 MG tablet Take 1 tablet (100 mg total) by mouth 2 (two) times daily. 60 tablet 6  . omeprazole (PRILOSEC) 20 MG capsule Take 20 mg by mouth every other day.     . rivaroxaban (XARELTO) 20 MG TABS tablet Take 1 tablet (20 mg total) by mouth daily with supper. 30 tablet 6  . rizatriptan (MAXALT-MLT) 10 MG disintegrating tablet Take 10 mg by mouth daily as needed for migraine. May  repeat in 2 hours if needed     No current facility-administered medications for this visit.    Allergies:   Asa   Social History:  The patient  reports that he has never smoked. He has never used smokeless tobacco. He reports that he does not drink alcohol or use illicit drugs.   Family History:  The patient's  family history includes Atrial fibrillation in his mother; Heart disease in his maternal uncle; Leukemia in his father; Lymphoma in his mother.    ROS:  Please see the history of present illness.   All  other systems are reviewed and negative.    PHYSICAL EXAM: VS:  There were no vitals taken for this visit. , BMI There is no weight on file to calculate BMI. GEN: Well nourished, well developed, in no acute distress HEENT: normal Neck: no JVD, carotid bruits, or masses Cardiac: iRRR; no murmurs, rubs, or gallops,no edema  Respiratory:  clear to auscultation bilaterally, normal work of breathing GI: soft, nontender, nondistended, + BS MS: no deformity or atrophy Skin: warm and dry  Neuro:  Strength and sensation are intact Psych: euthymic mood, full affect  EKG:  EKG is ordered today. The ekg ordered today shows afib V rate 93 bpm, nonspecific St/T changes   Recent Labs: 05/21/2014: Pro B Natriuretic peptide (BNP) 250.9* 09/06/2014: ALT 17; BUN 19; Creatinine 0.90; Hemoglobin 15.7; Platelets 166.0; Potassium 4.1; Sodium 139; TSH 2.92    Lipid Panel     Component Value Date/Time   CHOL 171 05/22/2014 0330   TRIG 117 05/22/2014 0330   HDL 46 05/22/2014 0330   CHOLHDL 3.7 05/22/2014 0330   VLDL 23 05/22/2014 0330   LDLCALC 102* 05/22/2014 0330   LDLDIRECT 96.1 09/02/2013 0839     Wt Readings from Last 3 Encounters:  10/06/14 231 lb 12.8 oz (105.144 kg)  10/04/14 230 lb (104.327 kg)  09/28/14 240 lb (108.863 kg)      Other studies Reviewed: Additional studies/ records that were reviewed today include: Dr Francesca Oman and Dr Juanetta Gosling notes   ASSESSMENT AND PLAN:  1. Persistent atrial fibrillation Remains in afib.  Has failed medical therapy with multaq. On flecainide to 100 mg BID.  Return for an ekg in the AF clinic in 1 week.  Proceed with cardioversion on 11/16/2014. Continue anticoagulation  If he does not maintain sinus rhythm with flecainide then our options would be tikosyn or ablation  2. Snoring Sleep study is planned for May, he might not need any therapy as he is feeling significantly better with weight loss already. Dr Collie Siad notes reviewed  3. Obesity Weight  loss of 30 lbs, he plans to loose 10 more.  4. Progressively worsening DOE - improved with weight loss, exercise and rate control.  5. Hypertension - controlled with Cardizem  Current medicines are reviewed at length with the patient today.   The patient does not have concerns regarding his medicines.   Follow-up:  DCCV on 11/16/14, follow up in 6 weeks.   Corliss Blacker, MD  10/26/2014 11:46 PM     [place] [address] [phone] (office) [phone] (fax)

## 2014-10-27 ENCOUNTER — Ambulatory Visit (INDEPENDENT_AMBULATORY_CARE_PROVIDER_SITE_OTHER): Payer: BLUE CROSS/BLUE SHIELD | Admitting: Cardiology

## 2014-10-27 ENCOUNTER — Encounter: Payer: Self-pay | Admitting: Cardiology

## 2014-10-27 ENCOUNTER — Encounter: Payer: Self-pay | Admitting: *Deleted

## 2014-10-27 VITALS — BP 104/70 | HR 82 | Ht 74.0 in | Wt 223.0 lb

## 2014-10-27 DIAGNOSIS — Z01812 Encounter for preprocedural laboratory examination: Secondary | ICD-10-CM

## 2014-10-27 DIAGNOSIS — Z79899 Other long term (current) drug therapy: Secondary | ICD-10-CM

## 2014-10-27 DIAGNOSIS — I10 Essential (primary) hypertension: Secondary | ICD-10-CM | POA: Diagnosis not present

## 2014-10-27 DIAGNOSIS — Z5181 Encounter for therapeutic drug level monitoring: Secondary | ICD-10-CM | POA: Diagnosis not present

## 2014-10-27 DIAGNOSIS — I4891 Unspecified atrial fibrillation: Secondary | ICD-10-CM | POA: Diagnosis not present

## 2014-10-27 NOTE — Patient Instructions (Signed)
Your physician recommends that you continue on your current medications as directed. Please refer to the Current Medication list given to you today.   Your physician has requested that you have a Cardioversion.  Electrical Cardioversion uses a jolt of electricity to your heart either through paddles or wired patches attached to your chest. This is a controlled, usually prescheduled, procedure. This procedure is done at the hospital and you are not awake during the procedure. You usually go home the day of the procedure. Please see the instruction sheet given to you today for more information.  YOUR CARDIOVERSION IS SCHEDULED FOR November 16, 2014 AT 1:30 PM WITH DR [name].  YOU MUST ARRIVE AT [facility] 12:00PM.     Your physician recommends that you return for lab work in: November 15, 2014 AT OUR OFFICE TO CHECK PRE-PROCEDURE LABS---CBC W DIFF, BMET, PT/INR   Your physician recommends that you schedule a follow-up appointment in: [place] ([place])

## 2014-11-15 ENCOUNTER — Other Ambulatory Visit (INDEPENDENT_AMBULATORY_CARE_PROVIDER_SITE_OTHER): Payer: BLUE CROSS/BLUE SHIELD | Admitting: *Deleted

## 2014-11-15 ENCOUNTER — Telehealth: Payer: Self-pay | Admitting: Cardiology

## 2014-11-15 DIAGNOSIS — Z01812 Encounter for preprocedural laboratory examination: Secondary | ICD-10-CM

## 2014-11-15 DIAGNOSIS — Z5181 Encounter for therapeutic drug level monitoring: Secondary | ICD-10-CM | POA: Diagnosis not present

## 2014-11-15 DIAGNOSIS — Z79899 Other long term (current) drug therapy: Secondary | ICD-10-CM

## 2014-11-15 DIAGNOSIS — I4891 Unspecified atrial fibrillation: Secondary | ICD-10-CM | POA: Diagnosis not present

## 2014-11-15 LAB — BASIC METABOLIC PANEL
BUN: 15 mg/dL (ref 6–23)
CO2: 25 mEq/L (ref 19–32)
Calcium: 9.1 mg/dL (ref 8.4–10.5)
Chloride: 107 mEq/L (ref 96–112)
Creatinine, Ser: 0.91 mg/dL (ref 0.40–1.50)
GFR: 91.13 mL/min (ref >60.00)
Glucose, Bld: 89 mg/dL (ref 70–99)
Potassium: 4.1 mEq/L (ref 3.5–5.1)
Sodium: 136 mEq/L (ref 135–145)

## 2014-11-15 LAB — CBC WITH DIFFERENTIAL/PLATELET
Basophils Absolute: 0 10*3/uL (ref 0.0–0.1)
Basophils Relative: 0.4 % (ref 0.0–3.0)
Eosinophils Absolute: 0.1 10*3/uL (ref 0.0–0.7)
Eosinophils Relative: 4.5 % (ref 0.0–5.0)
HCT: 43.1 % (ref 39.0–52.0)
Hemoglobin: 15 g/dL (ref 13.0–17.0)
Lymphocytes Relative: 23.7 % (ref 12.0–46.0)
Lymphs Abs: 0.7 10*3/uL (ref 0.7–4.0)
MCHC: 34.9 g/dL (ref 30.0–36.0)
MCV: 89.1 fl (ref 78.0–100.0)
Monocytes Absolute: 0.2 10*3/uL (ref 0.1–1.0)
Monocytes Relative: 5.9 % (ref 3.0–12.0)
Neutro Abs: 1.9 10*3/uL (ref 1.4–7.7)
Neutrophils Relative %: 65.5 % (ref 43.0–77.0)
Platelets: 124 10*3/uL — ABNORMAL LOW (ref 150.0–400.0)
RBC: 4.83 Mil/uL (ref 4.22–5.81)
RDW: 12.7 % (ref 11.5–15.5)
WBC: 2.9 10*3/uL — ABNORMAL LOW (ref 4.0–10.5)

## 2014-11-15 LAB — PROTIME-INR
INR: 2 ratio — ABNORMAL HIGH (ref 0.8–1.0)
Prothrombin Time: 22.3 s — ABNORMAL HIGH (ref 9.6–13.1)

## 2014-11-15 NOTE — Telephone Encounter (Signed)
New Message       Pt calling stating that he is scheduled for a cardioversion tomorrow with Dr. [name] and he has a head cold and a really bad cough and wants to know if he will still be able to have the procedure. Please call pt back and advise. Pt states it is ok to leave a detailed message.

## 2014-11-15 NOTE — Telephone Encounter (Signed)
Left message to call back.  He needs to reschedule cardioversion for next week with Dr. [name] since sick with fever.

## 2014-11-15 NOTE — Telephone Encounter (Signed)
Pt states that he has a  head cold with a touch of bronchitis, he states he may have a low grade fever. Pt states he is not on any antibiotics and is using OTC meds to treat symptoms.   Pt states he is ready to have cardioversion tomorrow but wanted Dr [name] to know about his symptoms in case she recommended that he reschedule.  Pt advised I will forward to Dr [name] for review and recommendations.

## 2014-11-15 NOTE — Telephone Encounter (Signed)
Pt made aware of needing to reschedule cardioversion.  Pt unable to do next week as his wife and after procedure care will be out of town.  Pt checking on availability with his wife, but made aware the Dr. [name] can do it on Tuesday, 4/26 if that works for him.  Pt to call back tomorrow and let us know.   Pt made aware of results no questions at this time. Pt agreed to call PCP.

## 2014-11-15 NOTE — Telephone Encounter (Signed)
Its not good to do cardioversion if he has URI or fever, I would postpone and do the next week on Tu, Th or Fri. Thank you, KN

## 2014-11-16 ENCOUNTER — Encounter (HOSPITAL_COMMUNITY): Payer: Self-pay | Admitting: Anesthesiology

## 2014-11-16 ENCOUNTER — Ambulatory Visit (INDEPENDENT_AMBULATORY_CARE_PROVIDER_SITE_OTHER): Payer: BLUE CROSS/BLUE SHIELD | Admitting: Family Medicine

## 2014-11-16 ENCOUNTER — Encounter: Payer: Self-pay | Admitting: Family Medicine

## 2014-11-16 ENCOUNTER — Ambulatory Visit: Payer: BLUE CROSS/BLUE SHIELD | Admitting: Adult Health

## 2014-11-16 ENCOUNTER — Encounter (HOSPITAL_COMMUNITY): Admission: RE | Payer: Self-pay | Source: Ambulatory Visit

## 2014-11-16 ENCOUNTER — Ambulatory Visit (HOSPITAL_COMMUNITY): Admission: RE | Admit: 2014-11-16 | Payer: BLUE CROSS/BLUE SHIELD | Source: Ambulatory Visit | Admitting: Cardiology

## 2014-11-16 VITALS — BP 137/79 | HR 79 | Temp 98.7°F | Ht 74.0 in | Wt 216.0 lb

## 2014-11-16 DIAGNOSIS — I4891 Unspecified atrial fibrillation: Secondary | ICD-10-CM | POA: Diagnosis not present

## 2014-11-16 DIAGNOSIS — D72819 Decreased white blood cell count, unspecified: Secondary | ICD-10-CM | POA: Diagnosis not present

## 2014-11-16 DIAGNOSIS — J069 Acute upper respiratory infection, unspecified: Secondary | ICD-10-CM | POA: Diagnosis not present

## 2014-11-16 SURGERY — CARDIOVERSION
Anesthesia: Monitor Anesthesia Care

## 2014-11-16 NOTE — Progress Notes (Signed)
Pre visit review using our clinic review tool, if applicable. No additional management support is needed unless otherwise documented below in the visit note. 

## 2014-11-16 NOTE — Progress Notes (Signed)
   Subjective:    Patient ID: Jorge Kelly, male    DOB: [birth year], 59 y.o.   MRN: [id number]  HPI Here at the request of Dr. [name] for a week of chest congestion, a dry cough, and sinus congestion. No fever. He actually feels much better today and is back to work. He had been scheduled for a cardioversion this morning but this was cancelled. Also when he had labs drawn yesterday his WBC count was low at 2.9 with a normal differential. His baseline is usually in the range of 6 to 8. He is concerned because his father had CLL.    Review of Systems  Constitutional: Negative.   HENT: Positive for congestion. Negative for postnasal drip and sinus pressure.   Eyes: Negative.   Respiratory: Positive for cough. Negative for chest tightness, shortness of breath and wheezing.   Cardiovascular: Negative.        Objective:   Physical Exam  Constitutional: He appears well-developed and well-nourished.  HENT:  Right Ear: External ear normal.  Left Ear: External ear normal.  Nose: Nose normal.  Mouth/Throat: Oropharynx is clear and moist.  Eyes: Conjunctivae are normal.  Pulmonary/Chest: Breath sounds normal. No respiratory distress. He has no wheezes. He has no rales.  Lymphadenopathy:    He has no cervical adenopathy.          Assessment & Plan:  This is consistent with a viral URI and he seems to be almost over it. He will get plenty of rest and he should be fine. Also a viral infection could account for his low WBC. We will plan to repeat a CBC in 2 weeks. He will follow up with Dr. [name] on 12-06-14 to decide if they will reschedule a cardioversion or not.

## 2014-11-17 ENCOUNTER — Encounter: Payer: Self-pay | Admitting: Cardiology

## 2014-11-19 ENCOUNTER — Telehealth: Payer: Self-pay | Admitting: Cardiology

## 2014-11-19 ENCOUNTER — Encounter: Payer: Self-pay | Admitting: *Deleted

## 2014-11-19 DIAGNOSIS — I4891 Unspecified atrial fibrillation: Secondary | ICD-10-CM

## 2014-11-19 DIAGNOSIS — Z01812 Encounter for preprocedural laboratory examination: Secondary | ICD-10-CM

## 2014-11-19 NOTE — Telephone Encounter (Signed)
Pt contacted to reschedule his cardioversion which was previously cancelled due to virus/respiratory infection.  Pt was advised to cancel cardioversion on 4/12 due to abnormal WBC and platelet count.  Pt was instructed to follow-up with his PCP for further investigation of infection.  Pt states he went to his PCP Dr Sarajane Jews and was diagnosed with a viral URI, no abt prescribed,  and instructed to contact our office to reschedule his cardioversion.  Dr. Sarajane Jews ordered for the pt to have a cbc w diff/ platelet to be done with pre-cardioversion labs. Rescheduled the pts cardioversion for 12/02/14 at 1 pm with Dr. Debara Pickett at [facility] the pt to arrive at the hospital on 4/28 at 1130 at [facility].  Scheduled the pt to have pre-procedure labs for 11/30/14 to check a CMET, PT/INR, CBC W DIFF/PLATELET (as requested by Dr Sarajane Jews) at our office.  Informed the pt when he comes in for labs, there will be a letter of instruction for cardioversion waiting for him to pick-up at the front desk. Pt verbalized understanding and agrees with this plan. Will inform Dr [name] to place hospital orders in for 12/02/14 at 1 pm with Dr Debara Pickett.

## 2014-11-19 NOTE — Telephone Encounter (Signed)
Pt contacted to reschedule his cardioversion which was previously cancelled due to virus/respiratory infection.  Pt was advised to cancel cardioversion on 4/12 due to abnormal WBC and platelet count.  Pt was instructed to follow-up with his PCP for further investigation of infection.  Pt states he went to his PCP Dr Sarajane Jews and was diagnosed with a viral URI, no abt prescribed, and instructed to contact our office to reschedule his cardioversion.  Dr. Sarajane Jews ordered for the pt to have a cbc w diff/ platelet to be done with pre-cardioversion labs. Rescheduled the pts cardioversion for 12/02/14 at 1 pm with Dr. Debara Pickett at [place] the pt to arrive at the hospital on 4/28 at 1130 at [facility].  Scheduled the pt to have pre-procedure labs for 11/30/14 to check a CMET, PT/INR, CBC W DIFF/PLATELET (as requested by Dr Sarajane Jews) at our office.  Informed the pt when he comes in for labs, there will be a letter of instruction for cardioversion waiting for him to pick-up at the front desk. Pt verbalized understanding and agrees with this plan. Will inform Dr [name] to place hospital orders in for 12/02/14 at 1 pm with Dr Debara Pickett.

## 2014-11-19 NOTE — Telephone Encounter (Signed)
New message         Pt would like to schedule cardioversion   pt would like to discuss meds as well

## 2014-11-21 NOTE — Addendum Note (Signed)
Addended by: Dorothy Spark on: 11/21/2014 04:49 PM   Modules accepted: Orders

## 2014-11-30 ENCOUNTER — Other Ambulatory Visit (INDEPENDENT_AMBULATORY_CARE_PROVIDER_SITE_OTHER): Payer: BLUE CROSS/BLUE SHIELD

## 2014-11-30 DIAGNOSIS — I4891 Unspecified atrial fibrillation: Secondary | ICD-10-CM

## 2014-11-30 DIAGNOSIS — Z01812 Encounter for preprocedural laboratory examination: Secondary | ICD-10-CM

## 2014-11-30 DIAGNOSIS — D72819 Decreased white blood cell count, unspecified: Secondary | ICD-10-CM

## 2014-11-30 LAB — COMPREHENSIVE METABOLIC PANEL
ALT: 14 U/L (ref 0–53)
AST: 15 U/L (ref 0–37)
Albumin: 4.2 g/dL (ref 3.5–5.2)
Alkaline Phosphatase: 64 U/L (ref 39–117)
BUN: 20 mg/dL (ref 6–23)
CO2: 29 mEq/L (ref 19–32)
Calcium: 9.3 mg/dL (ref 8.4–10.5)
Chloride: 107 mEq/L (ref 96–112)
Creatinine, Ser: 0.96 mg/dL (ref 0.40–1.50)
GFR: 85.66 mL/min (ref >60.00)
Glucose, Bld: 92 mg/dL (ref 70–99)
Potassium: 4.2 mEq/L (ref 3.5–5.1)
Sodium: 139 mEq/L (ref 135–145)
Total Bilirubin: 1.1 mg/dL (ref 0.2–1.2)
Total Protein: 6.2 g/dL (ref 6.0–8.3)

## 2014-11-30 LAB — CBC WITH DIFFERENTIAL/PLATELET
Basophils Absolute: 0 10*3/uL (ref 0.0–0.1)
Basophils Relative: 0.4 % (ref 0.0–3.0)
Eosinophils Absolute: 0.1 10*3/uL (ref 0.0–0.7)
Eosinophils Relative: 2.1 % (ref 0.0–5.0)
HCT: 42.3 % (ref 39.0–52.0)
Hemoglobin: 14.7 g/dL (ref 13.0–17.0)
Lymphocytes Relative: 32.5 % (ref 12.0–46.0)
Lymphs Abs: 1.3 10*3/uL (ref 0.7–4.0)
MCHC: 34.7 g/dL (ref 30.0–36.0)
MCV: 89.3 fl (ref 78.0–100.0)
Monocytes Absolute: 0.3 10*3/uL (ref 0.1–1.0)
Monocytes Relative: 7 % (ref 3.0–12.0)
Neutro Abs: 2.3 10*3/uL (ref 1.4–7.7)
Neutrophils Relative %: 58 % (ref 43.0–77.0)
Platelets: 158 10*3/uL (ref 150.0–400.0)
RBC: 4.74 Mil/uL (ref 4.22–5.81)
RDW: 13 % (ref 11.5–15.5)
WBC: 4 10*3/uL (ref 4.0–10.5)

## 2014-11-30 LAB — PROTIME-INR
INR: 2.1 ratio — ABNORMAL HIGH (ref 0.8–1.0)
Prothrombin Time: 22.8 s — ABNORMAL HIGH (ref 9.6–13.1)

## 2014-12-02 ENCOUNTER — Ambulatory Visit (HOSPITAL_COMMUNITY)
Admission: RE | Admit: 2014-12-02 | Discharge: 2014-12-02 | Disposition: A | Discharge status: Home / Self Care | Payer: BLUE CROSS/BLUE SHIELD | Source: Ambulatory Visit | Attending: Internal Medicine | Admitting: Internal Medicine

## 2014-12-02 ENCOUNTER — Encounter (HOSPITAL_COMMUNITY)
Admission: RE | Disposition: A | Discharge status: Home / Self Care | Payer: Self-pay | Source: Ambulatory Visit | Attending: Internal Medicine

## 2014-12-02 DIAGNOSIS — Z538 Procedure and treatment not carried out for other reasons: Secondary | ICD-10-CM | POA: Diagnosis not present

## 2014-12-02 DIAGNOSIS — I4891 Unspecified atrial fibrillation: Secondary | ICD-10-CM | POA: Diagnosis present

## 2014-12-02 DIAGNOSIS — I480 Paroxysmal atrial fibrillation: Secondary | ICD-10-CM | POA: Diagnosis present

## 2014-12-02 SURGERY — CANCELLED PROCEDURE

## 2014-12-02 MED ORDER — SODIUM CHLORIDE 0.9 % IJ SOLN: 3.0000 mL | INTRAMUSCULAR | Status: DC | PRN

## 2014-12-02 MED ORDER — SODIUM CHLORIDE 0.9 % IJ SOLN: 3.0000 mL | Freq: Two times a day (BID) | INTRAMUSCULAR | Status: DC

## 2014-12-02 MED ORDER — SODIUM CHLORIDE 0.9 % IV SOLN: 250.0000 mL | INTRAVENOUS | Status: DC

## 2014-12-02 NOTE — Progress Notes (Signed)
Patient admitted for cardioversion today, patient is in NSR according to 12 lead EKG and MD.

## 2014-12-02 NOTE — Progress Notes (Signed)
The patient was found to be in normal sinus rhythm on arrival, therefore the procedure was cancelled. I have reminded him to stay on his antiarrythmic and anticoagulation and keep his follow-up with Dr. [name] next week.  Pixie Casino, MD, [facility] Attending Cardiologist [place]

## 2014-12-02 NOTE — H&P (Signed)
     INTERVAL PROCEDURE H&P  History and Physical Interval Note:  12/02/2014 11:58 AM  Jorge Kelly has presented today for their planned procedure. The various methods of treatment have been discussed with the patient and family. After consideration of risks, benefits and other options for treatment, the patient has consented to the procedure.  The patients' outpatient history has been reviewed, patient examined, and no change in status from most recent office note within the past 30 days. I have reviewed the patients' chart and labs and will proceed as planned. Questions were answered to the patient's satisfaction.   Pixie Casino, MD, [facility] Attending Cardiologist [facility]  HILTY,Kenneth C 12/02/2014, 11:58 AM

## 2014-12-06 ENCOUNTER — Ambulatory Visit: Payer: BLUE CROSS/BLUE SHIELD | Admitting: Cardiology

## 2014-12-09 ENCOUNTER — Ambulatory Visit: Payer: BLUE CROSS/BLUE SHIELD | Admitting: Cardiology

## 2014-12-09 ENCOUNTER — Ambulatory Visit (HOSPITAL_BASED_OUTPATIENT_CLINIC_OR_DEPARTMENT_OTHER): Payer: BLUE CROSS/BLUE SHIELD | Attending: Pulmonary Disease

## 2014-12-09 ENCOUNTER — Ambulatory Visit (INDEPENDENT_AMBULATORY_CARE_PROVIDER_SITE_OTHER): Payer: BLUE CROSS/BLUE SHIELD | Admitting: Cardiology

## 2014-12-09 ENCOUNTER — Encounter: Payer: Self-pay | Admitting: Cardiology

## 2014-12-09 VITALS — Ht 74.0 in | Wt 199.0 lb

## 2014-12-09 VITALS — BP 104/70 | HR 91 | Ht 74.0 in | Wt 207.8 lb

## 2014-12-09 DIAGNOSIS — I10 Essential (primary) hypertension: Secondary | ICD-10-CM

## 2014-12-09 DIAGNOSIS — G4761 Periodic limb movement disorder: Secondary | ICD-10-CM | POA: Insufficient documentation

## 2014-12-09 DIAGNOSIS — G4733 Obstructive sleep apnea (adult) (pediatric): Secondary | ICD-10-CM

## 2014-12-09 DIAGNOSIS — I4891 Unspecified atrial fibrillation: Secondary | ICD-10-CM

## 2014-12-09 DIAGNOSIS — G4710 Hypersomnia, unspecified: Secondary | ICD-10-CM | POA: Diagnosis present

## 2014-12-09 DIAGNOSIS — R0683 Snoring: Secondary | ICD-10-CM

## 2014-12-09 NOTE — Progress Notes (Signed)
Patient ID: Jorge Kelly, male   DOB: 12-23-1955, 59 y.o.   MRN: [id number]     Date:  12/09/2014   ID:  Jorge Kelly, DOB Oct 06, 1955, MRN [id number]  PCP:  Chancy Hurter, MD  Cardiologist:  Dr [name] Primary Electrophysiologist: Dorothy Spark, MD    CC: a-fib   History of Present Illness: Jorge Kelly is a 59 y.o. male who presents today for electrophysiology evaluation.   The patient returns for further afib evaluation.  He recently underwent cardioversion on multaq.  He maintained sinus for several days during which he felt "much better".  His exercise tolerance was improved and fatigue was less.  He quickly returned to afib.  He has been placed on flecainide 50mg  BID, that was increased to 100 mg po BID by Dr [name] on 10/06/2014. He was in a-fib at that visit. He is tolerating flecainide without symptoms.  He has a sleep study pending. He is making progress with weight loss  10/27/2014 - The patient feels great, he denies any palpitations or syncope. He keeps exercising on a daily basis and lost overall 30 lbs. His sleep improved significantly. He has no lower extremity edema no chest pain or shortness of breath. He has been compliant with his medicine unfortunately still in atrial fibrillation.Today, he denies symptoms of palpitations, chest pain, shortness of breath, orthopnea, PND, lower extremity edema, claudication, dizziness, presyncope, syncope, bleeding, or neurologic sequela. The patient is tolerating medications without difficulties and is otherwise without complaint today.   12/09/2014 - the patient was scheduled for DCCV on 12/02/2014 but was found to be in [place]. Today he is again in SR, he feels great, continues to exercise on a regular basis. He has lost total of 46 lbs and also improved quality of sleep. He is scheduled for a sleep study . No CP, DOE, LE edema. No bleeding.  No palpitations, syncope.    Past Medical History  Diagnosis Date  . Actinic keratosis 10/24/2009  .  ALLERGIC RHINITIS 05/11/2007  . ELEVATED BLOOD PRESSURE WITHOUT DIAGNOSIS OF HYPERTENSION 11/12/2007  . GERD 05/11/2007  . MIGRAINE HEADACHE 05/11/2007  . SINUSITIS- ACUTE-NOS 05/10/2010  . TINEA CRURIS 10/24/2009  . Persistent atrial fibrillation 05/2014  . GI bleeding     20 years ago  . Esophageal stricture     treated with protonix  . Melanoma 01/2014    remoted from forehead  . Overweight   . Snores    Past Surgical History  Procedure Laterality Date  . Ankle fracture surgery  2010    x2-left  . Upper gi endoscopy    . Malignant melanoma      removed from forehead in June 2015  . Strabismus surgery Right 05/21/2014    Procedure: REPAIR STRABISMUS RIGHT EYE;  Surgeon: Derry Skill, MD;  Location: [place];  Service: Ophthalmology;  Laterality: Right;  . Cardioversion N/A 06/23/2014    Procedure: CARDIOVERSION;  Surgeon: Dorothy Spark, MD;  Location: [place];  Service: Cardiovascular;  Laterality: N/A;  . Cardioversion N/A 09/09/2014    Procedure: CARDIOVERSION;  Surgeon: Dorothy Spark, MD;  Location: [facility] ENDOSCOPY;  Service: Cardiovascular;  Laterality: N/A;     Current Outpatient Prescriptions  Medication Sig Dispense Refill  . acetaminophen (TYLENOL) 500 MG tablet Take 500 mg by mouth every 6 (six) hours as needed for mild pain or moderate pain.    Marland Kitchen diltiazem (CARDIZEM CD) 120 MG 24 hr capsule Take 1 capsule (120  mg total) by mouth daily. 90 capsule 3  . flecainide (TAMBOCOR) 100 MG tablet Take 1 tablet (100 mg total) by mouth 2 (two) times daily. 60 tablet 6  . omeprazole (PRILOSEC) 20 MG capsule Take 20 mg by mouth every other day.     . rivaroxaban (XARELTO) 20 MG TABS tablet Take 1 tablet (20 mg total) by mouth daily with supper. 30 tablet 6  . rizatriptan (MAXALT-MLT) 10 MG disintegrating tablet Take 10 mg by mouth daily as needed for migraine. May repeat in 2 hours if needed     No current facility-administered medications for this visit.     Allergies:   Asa   Social History:  The patient  reports that he has never smoked. He has never used smokeless tobacco. He reports that he does not drink alcohol or use illicit drugs.   Family History:  The patient's  family history includes Atrial fibrillation in his mother; Cancer in his sister; Diabetes type II in his sister; Heart disease in his maternal uncle; Leukemia in his father; Lymphoma in his mother.    ROS:  Please see the history of present illness.   All other systems are reviewed and negative.    PHYSICAL EXAM: VS:  There were no vitals taken for this visit. , BMI There is no weight on file to calculate BMI. GEN: Well nourished, well developed, in no acute distress HEENT: normal Neck: no JVD, carotid bruits, or masses Cardiac: RRR; no murmurs, rubs, or gallops,no edema  Respiratory:  clear to auscultation bilaterally, normal work of breathing GI: soft, nontender, nondistended, + BS MS: no deformity or atrophy Skin: warm and dry  Neuro:  Strength and sensation are intact Psych: euthymic mood, full affect  EKG:  EKG is ordered today. The ekg ordered today shows SR, 63 BPM   Recent Labs: 05/21/2014: Pro B Natriuretic peptide (BNP) 250.9* 09/06/2014: TSH 2.92 11/30/2014: ALT 14; BUN 20; Creatinine 0.96; Hemoglobin 14.7; Platelets 158.0; Potassium 4.2; Sodium 139    Lipid Panel     Component Value Date/Time   CHOL 171 05/22/2014 0330   TRIG 117 05/22/2014 0330   HDL 46 05/22/2014 0330   CHOLHDL 3.7 05/22/2014 0330   VLDL 23 05/22/2014 0330   LDLCALC 102* 05/22/2014 0330   LDLDIRECT 96.1 09/02/2013 0839     Wt Readings from Last 3 Encounters:  11/16/14 216 lb (97.977 kg)  10/27/14 223 lb (101.152 kg)  10/06/14 231 lb 12.8 oz (105.144 kg)      Other studies Reviewed: Additional studies/ records that were reviewed today include: Dr Francesca Oman and Dr Juanetta Gosling notes   ASSESSMENT AND PLAN:  1. Persistent atrial fibrillation - spontaneous cardioversion  with flecainide 100 mg po BID, we will continue.  PR 182, QRS 116, QT/QTc 406/499 ms, prior to flecainide QT/QTc 392/470 ms. Continue anticoagulation, no bleeding.   2. OSA - improved with significant weight loss, scheduled for a sleep study Sleep study is planned for May, he might not need any therapy as he is feeling significantly better with weight loss already. Dr Collie Siad notes reviewed  3. Obesity - lost 46 lbs and continues regular exercises  4. Progressively worsening DOE - resolved with weight loss, exercise and rate control.  5. Hypertension - controlled with Cardizem  Current medicines are reviewed at length with the patient today.   The patient does not have concerns regarding his medicines.   Follow-up:  Follow up in 3 months.   Signed, Dorothy Spark, MD  12/09/2014 9:25 AM     [facility] [address] [place] [address] [phone] (office) [phone] (fax)

## 2014-12-09 NOTE — Patient Instructions (Signed)
Medication Instructions:  Your physician recommends that you continue on your current medications as directed. Please refer to the Current Medication list given to you today.   Labwork: None   Testing/Procedures: None   Follow-Up: Your physician recommends that you schedule a follow-up appointment in: 3 months   Any Other Special Instructions Will Be Listed Below (If Applicable).

## 2014-12-10 ENCOUNTER — Ambulatory Visit (INDEPENDENT_AMBULATORY_CARE_PROVIDER_SITE_OTHER): Payer: BLUE CROSS/BLUE SHIELD | Admitting: Family Medicine

## 2014-12-10 ENCOUNTER — Encounter: Payer: Self-pay | Admitting: Family Medicine

## 2014-12-10 VITALS — BP 100/58 | HR 75 | Temp 98.1°F | Wt 210.0 lb

## 2014-12-10 DIAGNOSIS — G43109 Migraine with aura, not intractable, without status migrainosus: Secondary | ICD-10-CM | POA: Diagnosis not present

## 2014-12-10 DIAGNOSIS — K4090 Unilateral inguinal hernia, without obstruction or gangrene, not specified as recurrent: Secondary | ICD-10-CM | POA: Insufficient documentation

## 2014-12-10 DIAGNOSIS — E663 Overweight: Secondary | ICD-10-CM

## 2014-12-10 DIAGNOSIS — I480 Paroxysmal atrial fibrillation: Secondary | ICD-10-CM

## 2014-12-10 DIAGNOSIS — K219 Gastro-esophageal reflux disease without esophagitis: Secondary | ICD-10-CM

## 2014-12-10 NOTE — Assessment & Plan Note (Signed)
S:Much improved with weight loss. Sparing maxalt is effective.  A/P:PRN maxalt-continue use, glad frequency improved

## 2014-12-10 NOTE — Patient Instructions (Signed)
Things look great  Groin area-I agree does bulge slightly but not typical hernia apparent. If this area starts to cause you pain or if it worsens, could refer you to surgery for evaluation.   I will see you in a year otherwise or if you have any thing come up we are available

## 2014-12-10 NOTE — Assessment & Plan Note (Signed)
S:Reports hernia in childhood. After weight loss, left side side seems to protrude more. No pain.  A/P: slight asymmetry left groin compared to right but no obvious hernia. We discussed if had pain or worsening asymmetry, consider surgical consult

## 2014-12-10 NOTE — Progress Notes (Signed)
Garret Reddish, MD Phone: [phone]  Subjective:  Patient presents today to establish care with me as their new primary care provider. Patient was formerly a patient of Dr. Leanne Chang. Chief complaint-noted.   See problem oriented charting -exercises without difficulty. Regular exercise to maintain weight at 200 after losing from 245.  ROS- no chest pain, no palpitations or shortness of breath. No groin pain.   The following were reviewed and entered/updated in epic: Past Medical History  Diagnosis Date  . Actinic keratosis 10/24/2009  . ALLERGIC RHINITIS 05/11/2007  . ELEVATED BLOOD PRESSURE WITHOUT DIAGNOSIS OF HYPERTENSION 11/12/2007    Dr. Debara Pickett mentioned HTN on problem list  . GERD 05/11/2007  . MIGRAINE HEADACHE 05/11/2007  . Persistent atrial fibrillation 05/2014  . GI bleeding     20 years ago  . Esophageal stricture     treated with protonix  . Melanoma 01/2014    remoted from forehead  . Overweight   . Snores    Patient Active Problem List   Diagnosis Date Noted  . PAF (paroxysmal atrial fibrillation) 05/21/2014    Priority: High  . Melanoma 01/04/2014    Priority: Medium  . Migraine 05/11/2007    Priority: Medium  . Inguinal hernia 12/10/2014    Priority: Low  . Snoring 08/25/2014    Priority: Low  . Overweight 08/25/2014    Priority: Low  . 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 Actinic keratosis 10/24/2009    Priority: Low  . GERD 05/11/2007    Priority: Low   Past Surgical History  Procedure Laterality Date  . Ankle fracture surgery  2010    x2-left  . Upper gi endoscopy      stricture, just PPI  . Malignant melanoma      removed from forehead in June 2015  . Strabismus surgery Right 05/21/2014    Dr. Annamaria Boots  . Cardioversion N/A 06/23/2014  . Cardioversion N/A 09/09/2014    Family History  Problem Relation Age of Onset  . Lymphoma Mother   . Atrial fibrillation Mother     pacemaker in 22s  . Leukemia Father     passed from CLL    . Heart disease Maternal Uncle     mother lost 6 brothers to heart disease in 33s  . Cancer Sister     treated, cancer free  . Diabetes type II Sister     Medications- reviewed and updated Current Outpatient Prescriptions  Medication Sig Dispense Refill  . diltiazem (CARDIZEM CD) 120 MG 24 hr capsule Take 1 capsule (120 mg total) by mouth daily. 90 capsule 3  . flecainide (TAMBOCOR) 100 MG tablet Take 1 tablet (100 mg total) by mouth 2 (two) times daily. 60 tablet 6  . omeprazole (PRILOSEC) 20 MG capsule Take 20 mg by mouth every other day.     . rivaroxaban (XARELTO) 20 MG TABS tablet Take 1 tablet (20 mg total) by mouth daily with supper. 30 tablet 6  . rizatriptan (MAXALT-MLT) 10 MG disintegrating tablet Take 10 mg by mouth daily as needed for migraine. May repeat in 2 hours if needed    . acetaminophen (TYLENOL) 500 MG tablet Take 500 mg by mouth every 6 (six) hours as needed for mild pain or moderate pain.     No current facility-administered medications for this visit.    Allergies-reviewed and updated Allergies  Allergen Reactions  . Asa [Aspirin] Other (See Comments)    GI bleed in past  History   Social History  . Marital Status: Married    Spouse Name: N/A  . Number of Children: N/A  . Years of Education: N/A   Occupational History  . Attorney    Social History Main Topics  . Smoking status: Never Smoker   . Smokeless tobacco: Never Used  . Alcohol Use: No  . Drug Use: No  . Sexual Activity: Not on file   Other Topics Concern  . None   Social History Narrative   Lives in [place] with wife (wife patient at [facility]).  2 sons.- 11 Logan and 16 Luke in 2016. 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 Wife with pregnancy at 43 and 86.       Trial lawyer.      Hobbies: sails usually in [place]     ROS--See HPI   Objective: BP 100/58 mmHg  Pulse 75  Temp(Src) 98.1 F (36.7 C)  Wt 210 lb (95.255 kg) Gen:  NAD, resting comfortably HEENT: Mucous membranes are moist. Oropharynx normal Neck: no thyromegaly CV: RRR no murmurs rubs or gallops Lungs: CTAB no crackles, wheeze, rhonchi Abdomen: soft/nontender/nondistended/normal bowel sounds. No rebound or guarding.  Slight asymmetry Left groin compared to right, without worsening with cough/valsalva Ext: no edema, 2+ PT pulses Skin: warm, dry, no rash Neuro: grossly normal, moves all extremities, PERRLA  Assessment/Plan:  PAF (paroxysmal atrial fibrillation) S: spontaneously converted to NSR on flecainide 100mg  BID, also takes diltiazem and xarelto for  anticoagulation. Paroxysmal-currently in a fib.  A/P: doing well. Continue current rx as well as cardiology care.    Migraine S:Much improved with weight loss. Sparing maxalt is effective.  A/P:PRN maxalt-continue use, glad frequency improved    GERD S: prilosec otc, history of stricture  A/P: Patient may be able to trial off of PPI. We discussed if he is able to continue at current weight, perhaps consider trial off 1 year from now. Stricture history makes this more difficult decision but after weight loss suspect patient would do well.     Overweight S: lost 46 lbs and no longer obese A/P: current weight is reasonable given muscle mass. Continue to maintain home weight around 200 lbs which he was at this AM.     Inguinal hernia S:Reports hernia in childhood. After weight loss, left side side seems to protrude more. No pain.  A/P: slight asymmetry left groin compared to right but no obvious hernia. We discussed if had pain or worsening asymmetry, consider surgical consult    1 year CPE

## 2014-12-10 NOTE — Assessment & Plan Note (Signed)
S: prilosec otc, history of stricture  A/P: Patient may be able to trial off of PPI. We discussed if he is able to continue at current weight, perhaps consider trial off 1 year from now. Stricture history makes this more difficult decision but after weight loss suspect patient would do well.

## 2014-12-10 NOTE — Assessment & Plan Note (Signed)
S: spontaneously converted to NSR on flecainide 100mg  BID, also takes diltiazem and xarelto for  anticoagulation. Paroxysmal-currently in a fib.  A/P: doing well. Continue current rx as well as cardiology care.

## 2014-12-10 NOTE — Assessment & Plan Note (Signed)
S: lost 46 lbs and no longer obese A/P: current weight is reasonable given muscle mass. Continue to maintain home weight around 200 lbs which he was at this AM.

## 2014-12-15 NOTE — Sleep Study (Signed)
[place]  NAME: DUVID SMALLS DATE OF BIRTH:  February 13, 1956 MEDICAL RECORD NUMBER [id number]  LOCATION: [facility]  PHYSICIAN: Chesley Mires, M.D. DATE OF STUDY: 12/09/2014  SLEEP STUDY TYPE: Nocturnal polysomnogram               REFERRING PHYSICIAN: Chesley Mires, MD  INDICATION FOR STUDY:  Jorge Kelly is a 59 y.o. male who presents to the sleep lab for evaluation of hypersomnia with obstructive sleep apnea.  He has history of atrial fibrillation.  EPWORTH SLEEPINESS SCORE: 5. HEIGHT: 6\' 2"  (188 cm)  WEIGHT: 199 lb (90.266 kg)    Body mass index is 25.54 kg/(m^2).  NECK SIZE: 17 in.  MEDICATIONS:  Current Outpatient Prescriptions on File Prior to Visit  Medication Sig Dispense Refill  . acetaminophen (TYLENOL) 500 MG tablet Take 500 mg by mouth every 6 (six) hours as needed for mild pain or moderate pain.    Marland Kitchen diltiazem (CARDIZEM CD) 120 MG 24 hr capsule Take 1 capsule (120 mg total) by mouth daily. 90 capsule 3  . flecainide (TAMBOCOR) 100 MG tablet Take 1 tablet (100 mg total) by mouth 2 (two) times daily. 60 tablet 6  . omeprazole (PRILOSEC) 20 MG capsule Take 20 mg by mouth every other day.     . rivaroxaban (XARELTO) 20 MG TABS tablet Take 1 tablet (20 mg total) by mouth daily with supper. 30 tablet 6  . rizatriptan (MAXALT-MLT) 10 MG disintegrating tablet Take 10 mg by mouth daily as needed for migraine. May repeat in 2 hours if needed     No current facility-administered medications on file prior to visit.    SLEEP ARCHITECTURE:  Total recording time: 362 minutes.  Total sleep time was: 313.5 minutes.  Sleep efficiency: 86.6%.  Sleep latency: 4.5 minutes.  REM latency: 105 minutes.  Stage N1: 2.1%.  Stage N2: 76.7%.  Stage N3: 0%.  Stage R:  21.2%.  Supine sleep: 244 minutes.  Non-supine sleep: 69.5 minutes.  CARDIAC DATA:  Average heart rate: 64 beats per minute. Rhythm strip: sinus rhythm.  RESPIRATORY DATA: Average  respiratory rate: 16. Snoring: mild. Average AHI: 0.8.   Apnea index: 0.4.  Hypopnea index: 0.4. Obstructive apnea index: 0.  Central apnea index: 0.4.  Mixed apnea index: 0. REM AHI: 2.7.  NREM AHI: 0.2. Supine AHI: 0.9. Non-supine AHI: 0.  MOVEMENT/PARASOMNIA:  Periodic limb movement: 24.4.  Period limb movements with arousals: 2.5. Restroom trips: two.  OXYGEN DATA:  Baseline oxygenation: 95%. Lowest SaO2: 91%. Time spent below SaO2 90%: 0 minutes. Supplemental oxygen used: none.  IMPRESSION/ RECOMMENDATION:   This study did not show evidence for significant sleep disordered breathing.  He had a increase in his periodic limb movement index.  Clinical correlation is necessary to determine the significance of this.   Chesley Mires, M.D. Diplomate, Tax adviser of Sleep Medicine  ELECTRONICALLY SIGNED ON:  12/15/2014, 12:37 PM [address] PH: [phone]   FX: [phone] [place]

## 2014-12-17 ENCOUNTER — Telehealth: Payer: Self-pay | Admitting: Pulmonary Disease

## 2014-12-17 NOTE — Telephone Encounter (Signed)
PSG 12/09/14 >> AHI 0.8, SpO2 low 91%, PLMI 24.4.  Left message on pt's voicemail explaining no OSA, but did have increase in PLMI ?significance >> if no symptoms of RLS, then no further assessment.  Asked him to call back with questions.

## 2015-01-27 ENCOUNTER — Other Ambulatory Visit: Payer: Self-pay | Admitting: Cardiology

## 2015-03-23 ENCOUNTER — Encounter: Payer: Self-pay | Admitting: Cardiology

## 2015-03-23 ENCOUNTER — Ambulatory Visit (INDEPENDENT_AMBULATORY_CARE_PROVIDER_SITE_OTHER): Payer: BLUE CROSS/BLUE SHIELD | Admitting: Cardiology

## 2015-03-23 VITALS — BP 132/90 | HR 81 | Ht 74.0 in | Wt 202.0 lb

## 2015-03-23 DIAGNOSIS — L659 Nonscarring hair loss, unspecified: Secondary | ICD-10-CM

## 2015-03-23 DIAGNOSIS — T45515S Adverse effect of anticoagulants, sequela: Secondary | ICD-10-CM | POA: Diagnosis not present

## 2015-03-23 DIAGNOSIS — I480 Paroxysmal atrial fibrillation: Secondary | ICD-10-CM | POA: Diagnosis not present

## 2015-03-23 DIAGNOSIS — I10 Essential (primary) hypertension: Secondary | ICD-10-CM

## 2015-03-23 MED ORDER — APIXABAN 5 MG PO TABS: 5.0000 mg | ORAL_TABLET | Freq: Two times a day (BID) | ORAL | Status: DC

## 2015-03-23 NOTE — Patient Instructions (Signed)
Medication Instructions:   STOP TAKING XARELTO NOW   START TAKING ELIQUIS 5 MG TWICE DAILY      Follow-Up:  3 MONTHS WITH DR [name]

## 2015-03-23 NOTE — Progress Notes (Signed)
Patient ID: Jorge Kelly, male   DOB: [birth year], 59 y.o.   MRN: [id number]     Date:  03/23/2015   ID:  Jorge Kelly, DOB 09-18-1955, MRN [id number]  PCP:  Garret Reddish, MD  Cardiologist:  Dr [name] Primary Electrophysiologist: Dorothy Spark, MD    CC: a-fib, alopecia   History of Present Illness: Jorge Kelly is a 60 y.o. male who presents today for electrophysiology evaluation.   The patient returns for further afib evaluation.  He recently underwent cardioversion on multaq.  He maintained sinus for several days during which he felt "much better".  His exercise tolerance was improved and fatigue was less.  He quickly returned to afib.  He has been placed on flecainide 50mg  BID, that was increased to 100 mg po BID by Dr [name] on 10/06/2014. He was in a-fib at that visit. He is tolerating flecainide without symptoms.  He has a sleep study pending. He is making progress with weight loss  10/27/2014 - The patient feels great, he denies any palpitations or syncope. He keeps exercising on a daily basis and lost overall 30 lbs. His sleep improved significantly. He has no lower extremity edema no chest pain or shortness of breath. He has been compliant with his medicine unfortunately still in atrial fibrillation.Today, he denies symptoms of palpitations, chest pain, shortness of breath, orthopnea, PND, lower extremity edema, claudication, dizziness, presyncope, syncope, bleeding, or neurologic sequela. The patient is tolerating medications without difficulties and is otherwise without complaint today.   12/09/2014 - the patient was scheduled for DCCV on 12/02/2014 but was found to be in [place]. Today he is again in SR, he feels great, continues to exercise on a regular basis. He has lost total of 46 lbs and also improved quality of sleep. He is scheduled for a sleep study . No CP, DOE, LE edema. No bleeding.  No palpitations, syncope.   03/23/15 - 3 months follow up, feels great, occasional palpitations  - 3 episodes since the last visit. No CP, DOE, LE edema, orthopnea, he continues to run 1 mile daily. His new problem is alopecia that started about two months ago, he has never experienced it before.   Past Medical History  Diagnosis Date  . Actinic keratosis 10/24/2009  . ALLERGIC RHINITIS 05/11/2007  . ELEVATED BLOOD PRESSURE WITHOUT DIAGNOSIS OF HYPERTENSION 11/12/2007    Dr. Debara Pickett mentioned HTN on problem list  . GERD 05/11/2007  . MIGRAINE HEADACHE 05/11/2007  . Persistent atrial fibrillation 05/2014  . GI bleeding     20 years ago  . Esophageal stricture     treated with protonix  . Melanoma 01/2014    remoted from forehead  . Overweight   . Snores    Past Surgical History  Procedure Laterality Date  . Ankle fracture surgery  2010    x2-left  . Upper gi endoscopy      stricture, just PPI  . Malignant melanoma      removed from forehead in June 2015  . Strabismus surgery Right 05/21/2014    Dr. Annamaria Boots  . Cardioversion N/A 06/23/2014  . Cardioversion N/A 09/09/2014     Current Outpatient Prescriptions  Medication Sig Dispense Refill  . acetaminophen (TYLENOL) 500 MG tablet Take 500 mg by mouth every 6 (six) hours as needed for mild pain or moderate pain.    Marland Kitchen diltiazem (CARDIZEM CD) 120 MG 24 hr capsule Take 1 capsule (120 mg total) by mouth daily. 90 capsule 3  .  flecainide (TAMBOCOR) 100 MG tablet Take 1 tablet (100 mg total) by mouth 2 (two) times daily. 60 tablet 6  . omeprazole (PRILOSEC) 20 MG capsule Take 20 mg by mouth every other day.     . rizatriptan (MAXALT-MLT) 10 MG disintegrating tablet Take 10 mg by mouth daily as needed for migraine. May repeat in 2 hours if needed    . XARELTO 20 MG TABS tablet TAKE ONE TABLET BY MOUTH ONE TIME DAILY WITH SUPPER 30 tablet 6   No current facility-administered medications for this visit.    Allergies:   Asa   Social History:  The patient  reports that he has never smoked. He has never used smokeless tobacco. He reports  that he does not drink alcohol or use illicit drugs.   Family History:  The patient's  family history includes Atrial fibrillation in his mother; Cancer in his sister; Diabetes type II in his sister; Heart disease in his maternal uncle; Leukemia in his father; Lymphoma in his mother.    ROS:  Please see the history of present illness.   All other systems are reviewed and negative.    PHYSICAL EXAM: VS:  BP 132/90 mmHg  Pulse 81  Ht 6\' 2"  (1.88 m)  Wt 202 lb (91.627 kg)  BMI 25.92 kg/m2 , BMI Body mass index is 25.92 kg/(m^2). GEN: Well nourished, well developed, in no acute distress HEENT: normal Neck: no JVD, carotid bruits, or masses Cardiac: RRR; no murmurs, rubs, or gallops,no edema  Respiratory:  clear to auscultation bilaterally, normal work of breathing GI: soft, nontender, nondistended, + BS MS: no deformity or atrophy Skin: warm and dry  Neuro:  Strength and sensation are intact Psych: euthymic mood, full affect  EKG:  EKG is ordered today. The ekg ordered today shows SR, 63 BPM   Recent Labs: 05/21/2014: Pro B Natriuretic peptide (BNP) 250.9* 09/06/2014: TSH 2.92 11/30/2014: ALT 14; BUN 20; Creatinine, Ser 0.96; Hemoglobin 14.7; Platelets 158.0; Potassium 4.2; Sodium 139    Lipid Panel     Component Value Date/Time   CHOL 171 05/22/2014 0330   TRIG 117 05/22/2014 0330   HDL 46 05/22/2014 0330   CHOLHDL 3.7 05/22/2014 0330   VLDL 23 05/22/2014 0330   LDLCALC 102* 05/22/2014 0330   LDLDIRECT 96.1 09/02/2013 0839   Wt Readings from Last 3 Encounters:  03/23/15 202 lb (91.627 kg)  12/10/14 210 lb (95.255 kg)  12/09/14 199 lb (90.266 kg)    Other studies Reviewed: Additional studies/ records that were reviewed today include: Dr Francesca Oman and Dr Juanetta Gosling notes     ASSESSMENT AND PLAN:  1. Persistent atrial fibrillation - spontaneous cardioversion with flecainide 100 mg po BID, we will continue.  PR 182, QRS 116, QT/QTc 406/499 ms, prior to flecainide QT/QTc  392/470 ms. Continue anticoagulation, some bleeding when cut.   He is concerned about alopecia - Flecainide can cause it in less then 1 %, he wants to continue as he would rather not undergo ablation right now.  We will switch xarelto to eliquis, follow up in 3 months, if no change consider Tikosyn or ablation.   2. OSA - improved with significant weight loss, scheduled for a sleep study Sleep study is planned for May, he might not need any therapy as he is feeling significantly better with weight loss already. Dr Collie Siad notes reviewed  3. Obesity - lost 46 lbs and continues regular exercises  4. Progressively worsening DOE - resolved with weight loss, exercise and  rate control.  5. Hypertension - controlled with Cardizem  Current medicines are reviewed at length with the patient today.   The patient does not have concerns regarding his medicines.   Follow-up:  Follow up in 3 months.   Corliss Blacker, MD  03/23/2015 10:07 AM     [place] [place] [address] [phone] (office) [phone] (fax)

## 2015-03-31 ENCOUNTER — Other Ambulatory Visit: Payer: Self-pay | Admitting: Internal Medicine

## 2015-04-26 ENCOUNTER — Other Ambulatory Visit: Payer: Self-pay | Admitting: Internal Medicine

## 2015-06-27 ENCOUNTER — Other Ambulatory Visit: Payer: Self-pay | Admitting: Cardiology

## 2015-06-27 ENCOUNTER — Encounter: Payer: Self-pay | Admitting: Cardiology

## 2015-06-27 ENCOUNTER — Ambulatory Visit (INDEPENDENT_AMBULATORY_CARE_PROVIDER_SITE_OTHER): Payer: BLUE CROSS/BLUE SHIELD | Admitting: Cardiology

## 2015-06-27 ENCOUNTER — Other Ambulatory Visit: Payer: Self-pay | Admitting: Internal Medicine

## 2015-06-27 VITALS — BP 110/70 | HR 75 | Ht 74.0 in | Wt 202.4 lb

## 2015-06-27 DIAGNOSIS — I10 Essential (primary) hypertension: Secondary | ICD-10-CM | POA: Diagnosis not present

## 2015-06-27 DIAGNOSIS — I480 Paroxysmal atrial fibrillation: Secondary | ICD-10-CM

## 2015-06-27 DIAGNOSIS — G4733 Obstructive sleep apnea (adult) (pediatric): Secondary | ICD-10-CM | POA: Diagnosis not present

## 2015-06-27 MED ORDER — DILTIAZEM HCL ER COATED BEADS 120 MG PO CP24: 120.0000 mg | ORAL_CAPSULE | Freq: Every day | ORAL | Status: DC

## 2015-06-27 NOTE — Patient Instructions (Signed)
Medication Instructions:  Same-no changes  Labwork: None  Testing/Procedures: None  Follow-Up: Your physician wants you to follow-up in: 6 months. You will receive a reminder letter in the mail two months in advance. If you don't receive a letter, please call our office to schedule the follow-up appointment.      If you need a refill on your cardiac medications before your next appointment, please call your pharmacy.   

## 2015-06-27 NOTE — Progress Notes (Signed)
Patient ID: Jorge Kelly, male   DOB: 1955/12/01, 59 y.o.   MRN: [id number]     Date:  06/27/2015   ID:  Jorge Kelly, DOB 1956/06/10, MRN [id number]  PCP:  Garret Reddish, MD  Cardiologist:  Dr [name] Primary Electrophysiologist: Dorothy Spark, MD    CC: a-fib, alopecia   History of Present Illness: Jorge Kelly is a 59 y.o. male who presents today for electrophysiology evaluation.   The patient returns for further afib evaluation.  He recently underwent cardioversion on multaq.  He maintained sinus for several days during which he felt "much better".  His exercise tolerance was improved and fatigue was less.  He quickly returned to afib.  He has been placed on flecainide 50mg  BID, that was increased to 100 mg po BID by Dr [name] on 10/06/2014. He was in a-fib at that visit. He is tolerating flecainide without symptoms.  He has a sleep study pending. He is making progress with weight loss  10/27/2014 - The patient feels great, he denies any palpitations or syncope. He keeps exercising on a daily basis and lost overall 30 lbs. His sleep improved significantly. He has no lower extremity edema no chest pain or shortness of breath. He has been compliant with his medicine unfortunately still in atrial fibrillation.Today, he denies symptoms of palpitations, chest pain, shortness of breath, orthopnea, PND, lower extremity edema, claudication, dizziness, presyncope, syncope, bleeding, or neurologic sequela. The patient is tolerating medications without difficulties and is otherwise without complaint today.   12/09/2014 - the patient was scheduled for DCCV on 12/02/2014 but was found to be in [place]. Today he is again in SR, he feels great, continues to exercise on a regular basis. He has lost total of 46 lbs and also improved quality of sleep. He is scheduled for a sleep study . No CP, DOE, LE edema. No bleeding.  No palpitations, syncope.   03/23/15 - 3 months follow up, feels great, occasional palpitations  - 3 episodes since the last visit. No CP, DOE, LE edema, orthopnea, he continues to run 1 mile daily. His new problem is alopecia that started about two months ago, he has never experienced it before.  06/27/2015 - 3 months follow up - his alopecia has resolved after switching xarelto to eliquis. No palpitations since the last visit. No chest pain, fatigue, DOE. He continues to exercise.   Past Medical History  Diagnosis Date  . Actinic keratosis 10/24/2009  . ALLERGIC RHINITIS 05/11/2007  . ELEVATED BLOOD PRESSURE WITHOUT DIAGNOSIS OF HYPERTENSION 11/12/2007    Dr. Debara Pickett mentioned HTN on problem list  . GERD 05/11/2007  . MIGRAINE HEADACHE 05/11/2007  . Persistent atrial fibrillation ([place]) 05/2014  . GI bleeding     20 years ago  . Esophageal stricture     treated with protonix  . Melanoma ([place]) 01/2014    remoted from forehead  . Overweight   . Snores    Past Surgical History  Procedure Laterality Date  . Ankle fracture surgery  2010    x2-left  . Upper gi endoscopy      stricture, just PPI  . Malignant melanoma      removed from forehead in June 2015  . Strabismus surgery Right 05/21/2014    Dr. Annamaria Boots  . Cardioversion N/A 06/23/2014  . Cardioversion N/A 09/09/2014     Current Outpatient Prescriptions  Medication Sig Dispense Refill  . acetaminophen (TYLENOL) 500 MG tablet Take 500 mg by mouth every 6 (  six) hours as needed for mild pain or moderate pain.    Marland Kitchen apixaban (ELIQUIS) 5 MG TABS tablet Take 1 tablet (5 mg total) by mouth 2 (two) times daily. 180 tablet 3  . diltiazem (CARDIZEM CD) 120 MG 24 hr capsule TAKE ONE CAPSULE BY MOUTH ONE TIME DAILY 90 capsule 2  . flecainide (TAMBOCOR) 100 MG tablet TAKE ONE TABLET BY MOUTH TWICE DAILY 60 tablet 1  . omeprazole (PRILOSEC) 20 MG capsule Take 20 mg by mouth every other day.     . rizatriptan (MAXALT-MLT) 10 MG disintegrating tablet DISSOLVE 1 TAB BY MOUTH AS SOON AS HEADACHE STARTS. MAY REPEAT IN 2 HOURS IF NO RELIEF. 9 tablet 2    No current facility-administered medications for this visit.    Allergies:   Asa   Social History:  The patient  reports that he has never smoked. He has never used smokeless tobacco. He reports that he does not drink alcohol or use illicit drugs.   Family History:  The patient's  family history includes Atrial fibrillation in his mother; Cancer in his sister; Diabetes type II in his sister; Heart disease in his maternal uncle; Leukemia in his father; Lymphoma in his mother.    ROS:  Please see the history of present illness.   All other systems are reviewed and negative.    PHYSICAL EXAM: VS:  BP 110/70 mmHg  Pulse 75  Ht 6\' 2"  (1.88 m)  Wt 202 lb 6.4 oz (91.808 kg)  BMI 25.98 kg/m2  SpO2 98% , BMI Body mass index is 25.98 kg/(m^2). GEN: Well nourished, well developed, in no acute distress HEENT: normal Neck: no JVD, carotid bruits, or masses Cardiac: RRR; no murmurs, rubs, or gallops,no edema  Respiratory:  clear to auscultation bilaterally, normal work of breathing GI: soft, nontender, nondistended, + BS MS: no deformity or atrophy Skin: warm and dry  Neuro:  Strength and sensation are intact Psych: euthymic mood, full affect  EKG:  EKG is ordered today. The ekg ordered today shows SR, 63 BPM   Recent Labs: 09/06/2014: TSH 2.92 11/30/2014: ALT 14; BUN 20; Creatinine, Ser 0.96; Hemoglobin 14.7; Platelets 158.0; Potassium 4.2; Sodium 139    Lipid Panel     Component Value Date/Time   CHOL 171 05/22/2014 0330   TRIG 117 05/22/2014 0330   HDL 46 05/22/2014 0330   CHOLHDL 3.7 05/22/2014 0330   VLDL 23 05/22/2014 0330   LDLCALC 102* 05/22/2014 0330   LDLDIRECT 96.1 09/02/2013 0839   Wt Readings from Last 3 Encounters:  06/27/15 202 lb 6.4 oz (91.808 kg)  03/23/15 202 lb (91.627 kg)  12/10/14 210 lb (95.255 kg)    Other studies Reviewed: Additional studies/ records that were reviewed today include: Dr Francesca Oman and Dr Juanetta Gosling notes     ASSESSMENT AND  PLAN:  1. Persistent atrial fibrillation - spontaneous cardioversion with flecainide 100 mg po BID, we will continue.  PR 182, QRS 116, QT/QTc 406/499 ms, prior to flecainide QT/QTc 392/470 ms. Continue anticoagulation, some bleeding when cut.   He is concerned about alopecia - Flecainide can cause it in less then 1 %, he wants to continue as he would rather not undergo ablation right now. Alopecia has resolved after switching Xarelto to Eliquis.  We had a discussion about options in the future, he is worrying about potential risks of RF ablation. I think that conservative management is very reasonable but if his episodes of a-fib become more frequent I would support ablation  especially since his LA is moderately dilated.   2. OSA - improved with significant weight loss, scheduled for a sleep study Sleep study is planned for May, he might not need any therapy as he is feeling significantly better with weight loss already. Dr Collie Siad notes reviewed  3. Obesity - lost 46 lbs and continues regular exercises  4. Progressively worsening DOE - resolved with weight loss, exercise and rate control.  5. Hypertension - controlled with Cardizem  Follow up in 6 months.  Corliss Blacker, MD  06/27/2015 3:03 PM     [place] [address] [phone] (office) [phone] (fax)

## 2015-08-24 ENCOUNTER — Other Ambulatory Visit: Payer: Self-pay | Admitting: Internal Medicine

## 2015-08-29 ENCOUNTER — Other Ambulatory Visit: Payer: Self-pay | Admitting: Internal Medicine

## 2015-08-29 ENCOUNTER — Ambulatory Visit
Admission: RE | Admit: 2015-08-29 | Discharge: 2015-08-29 | Disposition: A | Discharge status: Home / Self Care | Payer: Self-pay | Source: Ambulatory Visit | Attending: Internal Medicine | Admitting: Internal Medicine

## 2015-08-29 DIAGNOSIS — Z9289 Personal history of other medical treatment: Secondary | ICD-10-CM

## 2015-12-10 ENCOUNTER — Other Ambulatory Visit: Payer: Self-pay | Admitting: Family Medicine

## 2015-12-12 ENCOUNTER — Encounter: Payer: Self-pay | Admitting: Family Medicine

## 2015-12-12 ENCOUNTER — Ambulatory Visit (INDEPENDENT_AMBULATORY_CARE_PROVIDER_SITE_OTHER): Payer: BLUE CROSS/BLUE SHIELD | Admitting: Family Medicine

## 2015-12-12 VITALS — BP 122/86 | HR 78 | Temp 98.2°F | Wt 204.0 lb

## 2015-12-12 DIAGNOSIS — I480 Paroxysmal atrial fibrillation: Secondary | ICD-10-CM

## 2015-12-12 DIAGNOSIS — G43109 Migraine with aura, not intractable, without status migrainosus: Secondary | ICD-10-CM

## 2015-12-12 DIAGNOSIS — K4090 Unilateral inguinal hernia, without obstruction or gangrene, not specified as recurrent: Secondary | ICD-10-CM

## 2015-12-12 DIAGNOSIS — K219 Gastro-esophageal reflux disease without esophagitis: Secondary | ICD-10-CM | POA: Diagnosis not present

## 2015-12-12 MED ORDER — RIZATRIPTAN BENZOATE 10 MG PO TBDP: ORAL_TABLET | ORAL | Status: DC

## 2015-12-12 NOTE — Assessment & Plan Note (Signed)
S: Keeps him from using the rowing machine. No trouble walking. Some tenderness when he bends forward. Really starting to bother him but is anxious about surgery. He denies worsening in size. States no issues when laying down but sees area bulge when he stands up most of the time though sometimes not in the morning with shower A/P:  Inguinal hernia far more apparent than last year- worsening. i advised surgical consult due to some discomfort and worsening during activities such as rowing. He is in agreement. Wife not present but states she is more hesitant than he is. See PAF section to address some of these concerns.

## 2015-12-12 NOTE — Progress Notes (Signed)
Subjective:  SHEDRIC IADAROLA is a 60 y.o. year old very pleasant male patient who presents for/with See problem oriented charting ROS- No chest pain or shortness of breath. No headache or blurry vision. .see any ROS included in HPI as well.   Past Medical History-  Patient Active Problem List   Diagnosis Date Noted  . PAF (paroxysmal atrial fibrillation) ([place]) 05/21/2014    Priority: High  . Melanoma ([place]) 01/04/2014    Priority: Medium  . Migraine 05/11/2007    Priority: Medium  . Inguinal hernia 12/10/2014    Priority: Low  . Snoring 08/25/2014    Priority: Low  . Overweight 08/25/2014    Priority: Low  . 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 Actinic keratosis 10/24/2009    Priority: Low  . GERD 05/11/2007    Priority: Low    Medications- reviewed and updated Current Outpatient Prescriptions  Medication Sig Dispense Refill  . apixaban (ELIQUIS) 5 MG TABS tablet Take 1 tablet (5 mg total) by mouth 2 (two) times daily. 180 tablet 3  . diltiazem (CARDIZEM CD) 120 MG 24 hr capsule Take 1 capsule (120 mg total) by mouth daily. 90 capsule 2  . flecainide (TAMBOCOR) 100 MG tablet TAKE ONE TABLET BY MOUTH TWICE DAILY 60 tablet 3  . acetaminophen (TYLENOL) 500 MG tablet Take 500 mg by mouth every 6 (six) hours as needed for mild pain or moderate pain. Reported on 12/12/2015    . rizatriptan (MAXALT-MLT) 10 MG disintegrating tablet DISSOLVE 1 TAB BY MOUTH AS SOON AS HEADACHE STARTS. MAY REPEAT IN 2 HOURS IF NO RELIEF. 9 tablet 2  . rizatriptan (MAXALT-MLT) 10 MG disintegrating tablet DISSOLVE 1 TAB BY MOUTH AS SOON AS HEADACHE STARTS. MAY REPEAT IN 2 HOURS IF NO RELIEF. 9 tablet 2   No current facility-administered medications for this visit.    Objective: BP 122/86 mmHg  Pulse 78  Temp(Src) 98.2 F (36.8 C)  Wt 204 lb (92.534 kg) Gen: NAD, resting comfortably CV: RRR no murmurs rubs or gallops Lungs: CTAB no crackles, wheeze, rhonchi Abdomen:  soft/nontender/nondistended/normal bowel sounds. No rebound or guarding.  Skin: warm, dry, no rash in groin Neuro: grossly normal, moves all extremities  Left groin From last year "Slight asymmetry Left groin compared to right, without worsening with cough/valsalva". This year prominent asymmetry in left groin compared to right- worse with valsalva- reducible hernia noted.   Assessment/Plan:  PAF (paroxysmal atrial fibrillation) S: converted to sinus on flecainide. Also on eliquis A/P: patient is anxious about surgery because of issues with a fib around prior minor surgeries and blood pressure issues surrounding another surgery. Discussed for this reason- discuss if surgery can be done inpatient with cardiology consult. Patient would like to move forward with surgery but he and his wife are concerned about potential cardiac complications from stress of surgery. He is able to do 4 mets without chest pain or shortness of breath.   Migraine S: sparing use of maxalt currently 3x in last year.  A/P: refilled maxalt- doing very well. No prophylaxis needed  GERD S: prilosec otc, history of stricture --> off for over a year 12/12/15. He has only had minor symptoms- he denies any difficulty swallowing A/P: continue off prilosec at this point, continue to monitor for signs/symptoms of recurrence of stricture    Inguinal hernia S: Keeps him from using the rowing machine. No trouble walking. Some tenderness when he bends forward. Really starting to bother him but  is anxious about surgery. He denies worsening in size. States no issues when laying down but sees area bulge when he stands up most of the time though sometimes not in the morning with shower A/P:  Inguinal hernia far more apparent than last year- worsening. i advised surgical consult due to some discomfort and worsening during activities such as rowing. He is in agreement. Wife not present but states she is more hesitant than he is. See PAF  section to address some of these concerns.   Return precautions advised.   Orders Placed This Encounter  Procedures  . Ambulatory referral to General Surgery    Referral Priority:  Routine    Referral Type:  Surgical    Referral Reason:  Specialty Services Required    Requested Specialty:  General Surgery    Number of Visits Requested:  1    Meds ordered this encounter  Medications  . rizatriptan (MAXALT-MLT) 10 MG disintegrating tablet    Sig: DISSOLVE 1 TAB BY MOUTH AS SOON AS HEADACHE STARTS. MAY REPEAT IN 2 HOURS IF NO RELIEF.    Dispense:  9 tablet    Refill:  2    Garret Reddish, MD

## 2015-12-12 NOTE — Patient Instructions (Signed)
We will call you within a week about your referral to general surgery for evaluation of hernia. If you do not hear within 2 weeks, give [place] a call.   Cardiology will be involved in decision to hold eliquis- given prior post surgical issues would also be reasonable to request consult in the hospital from them at time of surgery ( in that case would be inpatient instead of outpatient)  Refilled maxalt  Glad you are doing well off prilosec

## 2015-12-12 NOTE — Assessment & Plan Note (Signed)
S: prilosec otc, history of stricture --> off for over a year 12/12/15. He has only had minor symptoms- he denies any difficulty swallowing A/P: continue off prilosec at this point, continue to monitor for signs/symptoms of recurrence of stricture

## 2015-12-12 NOTE — Assessment & Plan Note (Signed)
S: converted to sinus on flecainide. Also on eliquis A/P: patient is anxious about surgery because of issues with a fib around prior minor surgeries and blood pressure issues surrounding another surgery. Discussed for this reason- discuss if surgery can be done inpatient with cardiology consult. Patient would like to move forward with surgery but he and his wife are concerned about potential cardiac complications from stress of surgery. He is able to do 4 mets without chest pain or shortness of breath.

## 2015-12-12 NOTE — Assessment & Plan Note (Signed)
S: sparing use of maxalt currently 3x in last year.  A/P: refilled maxalt- doing very well. No prophylaxis needed

## 2015-12-22 ENCOUNTER — Ambulatory Visit: Payer: Self-pay | Admitting: General Surgery

## 2015-12-22 NOTE — H&P (Signed)
History of Present Illness Ralene Ok MD; 12/22/2015 10:41 AM) The patient is a 60 year old male who presents with an inguinal hernia. The patient is a patient is a 60 year old male who is referred by Dr. Garret Reddish for evaluation of a left inguinal hernia. The patient states that he noticed the hernia over the last several months. He states over the last year has lost approximately 50 pounds. Patient does remember that he had a hernia when he was 37 or 60 years old. It was never fixed at that time.  The patient is active, does rowing, is involved in Sailing lifting heavy objects.    Allergies Shyrl Numbers, LPN; 624THL [id number] AM) Aspirin *ANALGESICS - NonNarcotic*  Medication History Shyrl Numbers, LPN; 624THL [id number] AM) DiltiaZEM HCl ER Coated Beads (120MG  Capsule ER 24HR, Oral) Active. Eliquis (5MG  Tablet, Oral two times daily) Active. Rizatriptan Benzoate (10MG  Tablet Disperse, Oral as needed) Active. Flecainide Acetate (100MG  Tablet, Oral two times daily) Active. Acetaminophen (325MG  Tablet, Oral as needed) Active. Medications Reconciled    Review of Systems Ralene Ok, MD; 12/22/2015 10:40 AM) General Not Present- Fever. Respiratory Not Present- Cough and Difficulty Breathing. Cardiovascular Not Present- Chest Pain. Musculoskeletal Not Present- Myalgia. Neurological Not Present- Weakness.  Vitals Joelene Millin F. Turpin LPN; 624THL [id number] AM) 12/22/2015 10:22 AM Weight: 204.2 lb Height: 74in Body Surface Area: 2.19 m Body Mass Index: 26.22 kg/m  Temp.: 97.33F(Oral)  Pulse: 79 (Regular)  P.OX: 96% (Room air) BP: 130/80 (Sitting, Left Arm, Standard)       Physical Exam Ralene Ok, MD; 12/22/2015 10:40 AM) General Mental Status-Alert. General Appearance-Consistent with stated age. Hydration-Well hydrated. Voice-Normal.  Head and Neck Head-normocephalic, atraumatic with no lesions or palpable  masses. Trachea-midline.  Eye Eyeball - Bilateral-Extraocular movements intact. Sclera/Conjunctiva - Bilateral-No scleral icterus.  Chest and Lung Exam Chest and lung exam reveals -quiet, even and easy respiratory effort with no use of accessory muscles. Inspection Chest Wall - Normal. Back - normal.  Cardiovascular Cardiovascular examination reveals -normal heart sounds, regular rate and rhythm with no murmurs.  Abdomen Inspection Skin - Scar - no surgical scars. Hernias - Inguinal hernia - Left - Reducible. Palpation/Percussion Normal exam - Soft, Non Tender, No Rebound tenderness, No Rigidity (guarding) and No hepatosplenomegaly. Auscultation Normal exam - Bowel sounds normal.  Neurologic Neurologic evaluation reveals -alert and oriented x 3 with no impairment of recent or remote memory. Mental Status-Normal.  Musculoskeletal Normal Exam - Left-Upper Extremity Strength Normal and Lower Extremity Strength Normal. Normal Exam - Right-Upper Extremity Strength Normal, Lower Extremity Weakness.    Assessment & Plan Ralene Ok MD; 12/22/2015 10:42 AM) LEFT INGUINAL HERNIA (K40.90) Impression: 60 year old male with a left likely indirect inguinal hernia.  1. The patient will like to proceed to the operating room for laparoscopic left inguinal hernia repair with mesh. Patient is unsure of time and will call us when he decides.  2. I discussed with the patient the signs and symptoms of incarceration and strangulation and the need to proceed to the ER should they occur.  3. I discussed with the patient the risks and benefits of the procedure to include but not limited to: Infection, bleeding, damage to surrounding structures, possible need for further surgery, possible nerve pain, and possible recurrence. The patient was understanding and wishes to proceed. Patient is unsure of the timing

## 2015-12-23 ENCOUNTER — Encounter: Payer: Self-pay | Admitting: *Deleted

## 2016-01-08 ENCOUNTER — Other Ambulatory Visit: Payer: Self-pay | Admitting: Internal Medicine

## 2016-01-09 MED ORDER — FLECAINIDE ACETATE 100 MG PO TABS: 100.0000 mg | ORAL_TABLET | Freq: Two times a day (BID) | ORAL | Status: DC

## 2016-01-09 NOTE — Addendum Note (Signed)
Addended by: Derl Barrow on: 01/09/2016 04:51 PM   Modules accepted: Orders

## 2016-01-10 ENCOUNTER — Other Ambulatory Visit: Payer: Self-pay | Admitting: Cardiology

## 2016-02-01 NOTE — Progress Notes (Signed)
Subjective:   Patient ID: Marcus Mcneil is a 60 y.o. male.    Chief Complaint: Pain of the Right Knee    This is a 59 y.o. male who presents today for evaluation of his right knee. He was climbing [place] a few years ago and could not bend his knee on the decent. His pain has progressively worsened  over the last year if he walks more then .5 miles he will feel sore. For the last 4 months he wears a brace if he knows he is going to be walking for a while. He does 30 minute workouts at [employer] 3 times a week that does not cause him any pain.          Past medical history, review of systems, past surgical history, social history, family history, medications and allergies:   .  No Known Allergies Body mass index is 24.37 kg/(m^2).   Current Outpatient Prescriptions:   .  LOSARTAN POTASSIUM PO, Take by mouth., Disp: , Rfl:    Past Medical History:   Diagnosis Date   . Cancer    . Hypertension    . Sleep apnea       Family History   Problem Relation Age of Onset   . Diabetes Father    . Coronary artery disease Father      ROS 02/01/2016   Constitutional: Unexplained Weight Loss Negative   Genitourinary: Frequent Urination Negative   HEENT: Vision Loss Negative   Neurological: Memory Loss  Negative   Integumentary: Rash Negative   Cardiovascular: Palpitations Negative   Hematologic: Bruises/Bleeds Easily Negative   Gastrointestinal: Constipation Negative   Immunological: Seasonal Allergies Negative   Musculoskeletal: Joint Pain Positive   Some recent data might be hidden        Objective:   Constitutional: No acute distress. Well nourished. Well developed.  Eyes: Sclera are nonicteric.  Respiratory: No labored breathing.  Cardiovascular: No marked edema.  Skin: No marked skin ulcers.  Neurological: No marked sensory loss noted.  Psychiatric: Alert and oriented x3.  Right Knee Exam     Tenderness   Right knee tenderness location: lateral patellar facet very tender, mild on medial  patellar facet     Range of  Motion   Extension: 0   Flexion: 130     Muscle Strength     The patient has normal right knee strength.    Tests   Lachman:  Anterior - negative (Firm endpoint)      Varus: negative  Valgus: negative    Other   Erythema: absent  Sensation: normal  Pulse: present  Swelling: none  Other tests: no effusion present            Procedures:   Procedures: None    Radiographs:     X-ray Knee Right 4+ Views    Result Date: 02/01/2016  Weight Bearing. Views: Standing AP, Lat, [place], [place]. Notes Impression: Bone spur on lateral patellar facet.        Assessment:     1. Chondromalacia of patella, right    2. Right knee pain, unspecified chronicity         Plan:   This is a 60 y.o. male who has chondromalacia of his right patella. I discussed the diagnosis and multiple treatment options with the patient. At this time he will optimize his shoes. He was given a script for PT if he chooses to go. He will follow up in 6 weeks if  he is still symptomatic.     Orders Placed This Encounter   . X-ray knee right 4+ views   . Ambulatory referral to Physical Therapy      Return in about 6 weeks (around 03/14/2016), or if symptoms worsen or fail to improve.          I, Edwena Felty, MD, personally, performed the services described in this documentation, as scribed in my presence, and it is both accurate and complete.  Scribed by: Chancy Hurter

## 2016-03-13 ENCOUNTER — Other Ambulatory Visit: Payer: Self-pay | Admitting: Cardiology

## 2016-03-20 ENCOUNTER — Other Ambulatory Visit: Payer: Self-pay | Admitting: Cardiology

## 2016-04-02 ENCOUNTER — Other Ambulatory Visit: Payer: Self-pay | Admitting: Cardiology

## 2016-04-05 ENCOUNTER — Other Ambulatory Visit: Payer: Self-pay | Admitting: Cardiology

## 2016-04-16 ENCOUNTER — Encounter (HOSPITAL_COMMUNITY): Payer: Self-pay

## 2016-04-16 ENCOUNTER — Emergency Department (HOSPITAL_COMMUNITY)
Admission: EM | Admit: 2016-04-16 | Discharge: 2016-04-16 | Disposition: A | Discharge status: Home / Self Care | Payer: BLUE CROSS/BLUE SHIELD | Attending: Emergency Medicine | Admitting: Emergency Medicine

## 2016-04-16 ENCOUNTER — Emergency Department (HOSPITAL_COMMUNITY): Payer: BLUE CROSS/BLUE SHIELD

## 2016-04-16 DIAGNOSIS — N202 Calculus of kidney with calculus of ureter: Secondary | ICD-10-CM | POA: Insufficient documentation

## 2016-04-16 DIAGNOSIS — N200 Calculus of kidney: Secondary | ICD-10-CM

## 2016-04-16 DIAGNOSIS — R109 Unspecified abdominal pain: Secondary | ICD-10-CM | POA: Diagnosis present

## 2016-04-16 LAB — URINE MICROSCOPIC-ADD ON
Bacteria, UA: NONE SEEN
Squamous Epithelial / LPF: NONE SEEN

## 2016-04-16 LAB — URINALYSIS, ROUTINE W REFLEX MICROSCOPIC
Bilirubin Urine: NEGATIVE
Glucose, UA: NEGATIVE mg/dL
Ketones, ur: NEGATIVE mg/dL
Nitrite: NEGATIVE
Protein, ur: NEGATIVE mg/dL
Specific Gravity, Urine: 1.025 (ref 1.005–1.030)
pH: 5.5 (ref 5.0–8.0)

## 2016-04-16 MED ORDER — HYDROMORPHONE HCL 1 MG/ML IJ SOLN
1.0000 mg | Freq: Once | INTRAMUSCULAR | Status: AC
  Administered 2016-04-16: 1 mg via INTRAVENOUS
  Filled 2016-04-16: qty 1

## 2016-04-16 MED ORDER — OXYCODONE-ACETAMINOPHEN 5-325 MG PO TABS
1.0000 | ORAL_TABLET | Freq: Once | ORAL | Status: AC
  Administered 2016-04-16: 1 via ORAL
  Filled 2016-04-16: qty 1

## 2016-04-16 MED ORDER — ONDANSETRON HCL 4 MG PO TABS: 4.0000 mg | ORAL_TABLET | Freq: Four times a day (QID) | ORAL | 0 refills | Status: DC

## 2016-04-16 MED ORDER — OXYCODONE-ACETAMINOPHEN 5-325 MG PO TABS: 2.0000 | ORAL_TABLET | ORAL | 0 refills | Status: DC | PRN

## 2016-04-16 MED ORDER — FENTANYL CITRATE (PF) 100 MCG/2ML IJ SOLN
50.0000 ug | INTRAMUSCULAR | Status: DC | PRN
  Administered 2016-04-16: 50 ug via INTRAVENOUS
  Filled 2016-04-16 (×2): qty 2

## 2016-04-16 MED ORDER — TAMSULOSIN HCL 0.4 MG PO CAPS: 0.4000 mg | ORAL_CAPSULE | Freq: Every day | ORAL | 0 refills | Status: DC

## 2016-04-16 MED ORDER — ONDANSETRON HCL 4 MG/2ML IJ SOLN
4.0000 mg | Freq: Once | INTRAMUSCULAR | Status: AC
  Administered 2016-04-16: 4 mg via INTRAVENOUS
  Filled 2016-04-16: qty 2

## 2016-04-16 NOTE — ED Triage Notes (Signed)
Pt. Coming from work via [place] c/o right flank pain. Pt. sts he was in court and had to leave because the pain was so severe and nausea. Pt. Hx of kidney stones in left. Pt. Also hx of afib. Pt. Given pain and nausea medication per pain protocol with relief.

## 2016-04-16 NOTE — ED Provider Notes (Signed)
[facility] DEPT Provider Note   CSN: [id number] Arrival date & time: 04/16/16  0931     History   Chief Complaint Chief Complaint  Patient presents with  . Flank Pain    right    HPI Jorge Kelly is a 60 y.o. male.  HPI   60 year old male with history of atrial fibrillation currently on adequate, prior history of kidney stone, GI bleeding, migraine presenting with acute onset of right flank pain. 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 He described pain as a stabbing sensation, colicky, nonradiating, intense. Felt nauseous but did not vomit. Pain did improve after patient received 62mcg of fentanyl arrival. He felt the pain is similar to prior kidney stone that he has in the past. He denies any associated fever, chills, lightheadedness, dizziness, chest pain, shortness of breath, productive cough, dysuria, hematuria, bowel bladder problem, or rash. He denies any recent strenuous activities or heavy lifting. Patient states his atrial fibrillation is well controlled with Eliquis and flecainide. No complaint of testicle pain or penile pain  Past Medical History:  Diagnosis Date  . Actinic keratosis 10/24/2009  . ALLERGIC RHINITIS 05/11/2007  . ELEVATED BLOOD PRESSURE WITHOUT DIAGNOSIS OF HYPERTENSION 11/12/2007   Dr. Debara Pickett mentioned HTN on problem list  . Esophageal stricture    treated with protonix  . GERD 05/11/2007  . GI bleeding    20 years ago  . Melanoma ([place]) 01/2014   remoted from forehead  . MIGRAINE HEADACHE 05/11/2007  . Overweight   . Persistent atrial fibrillation ([place]) 05/2014  . Snores     Patient Active Problem List   Diagnosis Date Noted  . Inguinal hernia 12/10/2014  . Snoring 08/25/2014  . Overweight 08/25/2014  . 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 PAF (paroxysmal atrial fibrillation) ([place]) 05/21/2014  . Melanoma ([place]) 01/04/2014  . Actinic keratosis 10/24/2009  .  Migraine 05/11/2007  . GERD 05/11/2007    Past Surgical History:  Procedure Laterality Date  . ANKLE FRACTURE SURGERY  2010   x2-left  . CARDIOVERSION N/A 06/23/2014  . CARDIOVERSION N/A 09/09/2014  . malignant melanoma     removed from forehead in June 2015  . STRABISMUS SURGERY Right 05/21/2014   Dr. Annamaria Boots  . UPPER GI ENDOSCOPY     stricture, just PPI       Home Medications    Prior to Admission medications   Medication Sig Start Date End Date Taking? Authorizing Provider  acetaminophen (TYLENOL) 500 MG tablet Take 500 mg by mouth every 6 (six) hours as needed for mild pain or moderate pain. Reported on 12/12/2015    Historical Provider, MD  diltiazem (CARDIZEM CD) 120 MG 24 hr capsule Take 1 capsule (120 mg total) by mouth daily. 06/27/15   Dorothy Spark, MD  diltiazem (CARDIZEM CD) 120 MG 24 hr capsule TAKE ONE CAPSULE BY MOUTH ONE TIME DAILY 03/13/16   Dorothy Spark, MD  diltiazem (CARDIZEM CD) 120 MG 24 hr capsule Take 1 capsule (120 mg total) by mouth daily. Please call and schedule a one year follow up appointment 03/20/16   Dorothy Spark, MD  ELIQUIS 5 MG TABS tablet TAKE 1 TABLET (5 MG TOTAL) BY MOUTH 2 (TWO) TIMES DAILY. 04/02/16   Dorothy Spark, MD  flecainide (TAMBOCOR) 100 MG tablet Take 1 tablet (100 mg total) by mouth 2 (two) times daily. 01/09/16   [name], MD  rizatriptan (MAXALT-MLT) 10 MG disintegrating tablet DISSOLVE 1 TAB BY MOUTH AS SOON AS HEADACHE STARTS. MAY REPEAT IN 2 HOURS IF NO RELIEF. 12/12/15   [name], MD  rizatriptan (MAXALT-MLT) 10 MG disintegrating tablet DISSOLVE 1 TAB BY MOUTH AS SOON AS HEADACHE STARTS. MAY REPEAT IN 2 HOURS IF NO RELIEF. 12/12/15   [name], MD    Family History Family History  Problem Relation Age of Onset  . Lymphoma Mother   . Atrial fibrillation Mother     pacemaker in 10s  . Leukemia Father     passed from CLL  . Cancer Sister     treated, cancer free  . Diabetes type II Sister   .  Heart disease Maternal Uncle     mother lost 6 brothers to heart disease in 23s    Social History Social History  Substance Use Topics  . Smoking status: Never Smoker  . Smokeless tobacco: Never Used  . Alcohol use No     Allergies   Asa [aspirin]   Review of Systems Review of Systems  All other systems reviewed and are negative.    Physical Exam Updated Vital Signs BP 144/100 (BP Location: Right Arm)   Pulse 66   Temp 97.9 F (36.6 C) (Oral)   Resp 16   Ht 6\' 2"  (1.88 m)   Wt 89.4 kg   SpO2 97%   BMI 25.29 kg/m   Physical Exam  Constitutional: He appears well-developed and well-nourished. No distress.  HENT:  Head: Atraumatic.  Eyes: Conjunctivae are normal.  Neck: Neck supple.  Cardiovascular: Normal rate and regular rhythm.   Pulmonary/Chest: Effort normal and breath sounds normal.  Abdominal: There is no tenderness.  Right CVA tenderness on percussion  Genitourinary:  Genitourinary Comments: Chaperone present during exam. Normal circumcised penis. No inguinal lymphadenopathy or inguinal hernia noted. Testicles are nontender with normal lie  Neurological: He is alert.  Skin: No rash noted.  Psychiatric: He has a normal mood and affect.  Nursing note and vitals reviewed.    ED Treatments / Results  Labs (all labs ordered are listed, but only abnormal results are displayed) Labs Reviewed  URINALYSIS, ROUTINE W REFLEX MICROSCOPIC (NOT AT [facility]) - Abnormal; Notable for the following:       Result Value   Hgb urine dipstick SMALL (*)    Leukocytes, UA TRACE (*)    All other components within normal limits  URINE MICROSCOPIC-ADD ON    EKG  EKG Interpretation None       Radiology Ct Renal Stone Study  Result Date: 04/16/2016 CLINICAL DATA:  Right flank/right lower quadrant pain. History of nephrolithiasis. EXAM: CT ABDOMEN AND PELVIS WITHOUT CONTRAST TECHNIQUE: Multidetector CT imaging of the abdomen and pelvis was performed following the  standard protocol without IV contrast. COMPARISON:  08/12/2005 CT abdomen/ pelvis. FINDINGS: Lower chest: Left lower lobe 4 mm solid pulmonary nodule (series 3/image 80) is stable since 2007 and considered benign. Mild dependent atelectasis in both lower lobes. Right coronary atherosclerosis. Hepatobiliary: Normal liver with no liver mass. Normal gallbladder with no radiopaque cholelithiasis. No biliary ductal dilatation. Pancreas: Normal, with no mass or duct dilation. Spleen: Normal size. No mass. Adrenals/Urinary Tract: Normal adrenals. Obstructing 4 mm stone at the right ureterovesical junction with mild right hydroureteronephrosis and asymmetric right perinephric fat stranding. Punctate nonobstructing 1 mm stone in the lower right kidney. No additional urinary tract stones. No left hydronephrosis. Normal caliber left ureter. No contour deforming renal mass.  No bladder wall thickening. Stomach/Bowel: Tiny hiatal hernia. Otherwise grossly normal stomach. Normal caliber small bowel with no small bowel wall thickening. Normal appendix. Normal large bowel with no diverticulosis, large bowel wall thickening or pericolonic fat stranding. Vascular/Lymphatic: Atherosclerotic nonaneurysmal abdominal aorta. No pathologically enlarged lymph nodes in the abdomen or pelvis. Reproductive: Mildly enlarged prostate with coarse nonspecific internal prostatic calcifications. Other: No pneumoperitoneum, ascites or focal fluid collection. Small fat containing left inguinal hernia. Musculoskeletal: No aggressive appearing focal osseous lesions. Scattered subcentimeter sclerotic lesions in the bilateral pelvic girdle are nonspecific and statistically most likely to represent benign bone islands. Moderate thoracolumbar spondylosis . IMPRESSION: 1. Obstructing 4 mm right ureterovesical junction stone with mild right hydroureteronephrosis. 2. Additional nonobstructing punctate stone in the lower right kidney . 3. Additional findings  include aortic atherosclerosis, tiny hiatal hernia, mildly enlarged prostate and small fat containing left inguinal hernia. Electronically Signed   By: Ilona Sorrel M.D.   On: 04/16/2016 13:10    Procedures Procedures (including critical care time)  Medications Ordered in ED Medications  fentaNYL (SUBLIMAZE) injection 50 mcg (50 mcg Intravenous Given 04/16/16 1021)  ondansetron (ZOFRAN) injection 4 mg (4 mg Intravenous Given 04/16/16 1021)     Initial Impression / Assessment and Plan / ED Course  I have reviewed the triage vital signs and the nursing notes.  Pertinent labs & imaging results that were available during my care of the patient were reviewed by me and considered in my medical decision making (see chart for details).  Clinical Course    BP 146/89   Pulse 69   Temp 97.9 F (36.6 C) (Oral)   Resp 16   Ht 6\' 2"  (1.88 m)   Wt 89.4 kg   SpO2 96%   BMI 25.29 kg/m    Final Clinical Impressions(s) / ED Diagnoses   Final diagnoses:  Right flank pain  Kidney stone    New Prescriptions New Prescriptions   OXYCODONE-ACETAMINOPHEN (PERCOCET/ROXICET) 5-325 MG TABLET    Take 2 tablets by mouth every 4 (four) hours as needed for severe pain.   TAMSULOSIN (FLOMAX) 0.4 MG CAPS CAPSULE    Take 1 capsule (0.4 mg total) by mouth daily after supper.   10:47 AM Patient with history of remote kidney stone without any complication feature presenting with right flank pain similar to prior stone. Pain medication given, will check UA and will obtain a CT scan for further evaluation.  2:47 PM CT scan demonstrates a 56mm kidney stone at UVJ.  No other concerning features aside from some nonobstructive stones.  Pt to f/u with urology for further care.  Pt was kept in the ER for pain control, pain is now managed.      Domenic Moras, PA-C 04/16/16 [place], MD 04/16/16 [phone]

## 2016-04-16 NOTE — Discharge Instructions (Signed)
You have a 82mm kidney stone on the right side.  Please use urine strainer to help capture the stone and bring it to urology office for further management.  Take pain medication as needed for pain.  Be aware pain medication can cause drowsiness therefore avoid driving or operate heavy machinery while on pain medication.  Return if you have any concerns.

## 2016-04-17 ENCOUNTER — Other Ambulatory Visit: Payer: Self-pay | Admitting: Cardiology

## 2016-04-18 ENCOUNTER — Other Ambulatory Visit: Payer: Self-pay | Admitting: Cardiology

## 2016-04-18 ENCOUNTER — Ambulatory Visit (INDEPENDENT_AMBULATORY_CARE_PROVIDER_SITE_OTHER): Payer: BLUE CROSS/BLUE SHIELD | Admitting: Interventional Cardiology

## 2016-04-18 ENCOUNTER — Encounter: Payer: Self-pay | Admitting: Interventional Cardiology

## 2016-04-18 VITALS — BP 118/72 | HR 101 | Ht 74.0 in | Wt 204.0 lb

## 2016-04-18 DIAGNOSIS — Z01812 Encounter for preprocedural laboratory examination: Secondary | ICD-10-CM

## 2016-04-18 DIAGNOSIS — I480 Paroxysmal atrial fibrillation: Secondary | ICD-10-CM

## 2016-04-18 DIAGNOSIS — R5383 Other fatigue: Secondary | ICD-10-CM | POA: Diagnosis not present

## 2016-04-18 DIAGNOSIS — I4891 Unspecified atrial fibrillation: Secondary | ICD-10-CM

## 2016-04-18 LAB — BASIC METABOLIC PANEL
BUN: 15 mg/dL (ref 7–25)
CO2: 26 mmol/L (ref 20–31)
Calcium: 9.4 mg/dL (ref 8.6–10.3)
Chloride: 106 mmol/L (ref 98–110)
Creat: 0.83 mg/dL (ref 0.70–1.33)
Glucose, Bld: 90 mg/dL (ref 65–99)
Potassium: 3.9 mmol/L (ref 3.5–5.3)
Sodium: 142 mmol/L (ref 135–146)

## 2016-04-18 LAB — CBC WITH DIFFERENTIAL/PLATELET
Basophils Absolute: 0 cells/uL (ref 0–200)
Basophils Relative: 0 %
Eosinophils Absolute: 201 cells/uL (ref 15–500)
Eosinophils Relative: 3 %
HCT: 44 % (ref 38.5–50.0)
Hemoglobin: 15 g/dL (ref 13.2–17.1)
Lymphocytes Relative: 29 %
Lymphs Abs: 1943 cells/uL (ref 850–3900)
MCH: 31.3 pg (ref 27.0–33.0)
MCHC: 34.1 g/dL (ref 32.0–36.0)
MCV: 91.7 fL (ref 80.0–100.0)
MPV: 11 fL (ref 7.5–12.5)
Monocytes Absolute: 603 cells/uL (ref 200–950)
Monocytes Relative: 9 %
Neutro Abs: 3953 cells/uL (ref 1500–7800)
Neutrophils Relative %: 59 %
Platelets: 166 10*3/uL (ref 140–400)
RBC: 4.8 MIL/uL (ref 4.20–5.80)
RDW: 12.5 % (ref 11.0–15.0)
WBC: 6.7 10*3/uL (ref 3.8–10.8)

## 2016-04-18 MED ORDER — DILTIAZEM HCL ER COATED BEADS 120 MG PO CP24: 120.0000 mg | ORAL_CAPSULE | Freq: Every day | ORAL | 3 refills | Status: DC

## 2016-04-18 NOTE — Telephone Encounter (Signed)
F/u    Pt has questions about his meds. Please call.

## 2016-04-18 NOTE — Progress Notes (Signed)
Cardiology Office Note   Date:  04/18/2016   ID:  Jorge TOBOLSKI, DOB 05-10-1956, MRN [id number]  PCP:  Garret Reddish, MD    Chief Complaint  Patient presents with  . Follow-up     Wt Readings from Last 3 Encounters:  04/18/16 204 lb (92.5 kg)  04/16/16 197 lb (89.4 kg)  12/12/15 204 lb (92.5 kg)       History of Present Illness: Jorge Kelly is a 60 y.o. male  with a history of obesity and paroxysmal atrial fibrillation. He has lost significant weight over the past couple of years. Within the last 2 years, he was started on flecainide.  Recently, he had a kidney stone. During the kidney stone episode, he felt himself go back and atrial fibrillation. He is here for further evaluation.  He has maintained his flecainide and Eliquis use. He has not missed any doses. Today, he felt a little bit better. Her heart rate seemed to be less but he didn't think he was back in normal rhythm. He is not having any chest discomfort.    Past Medical History:  Diagnosis Date  . Actinic keratosis 10/24/2009  . ALLERGIC RHINITIS 05/11/2007  . ELEVATED BLOOD PRESSURE WITHOUT DIAGNOSIS OF HYPERTENSION 11/12/2007   Dr. Debara Pickett mentioned HTN on problem list  . Esophageal stricture    treated with protonix  . GERD 05/11/2007  . GI bleeding    20 years ago  . Melanoma ([place]) 01/2014   remoted from forehead  . MIGRAINE HEADACHE 05/11/2007  . Overweight   . Persistent atrial fibrillation ([place]) 05/2014  . Snores     Past Surgical History:  Procedure Laterality Date  . ANKLE FRACTURE SURGERY  2010   x2-left  . CARDIOVERSION N/A 06/23/2014  . CARDIOVERSION N/A 09/09/2014  . malignant melanoma     removed from forehead in June 2015  . STRABISMUS SURGERY Right 05/21/2014   Dr. Annamaria Boots  . UPPER GI ENDOSCOPY     stricture, just PPI     Current Outpatient Prescriptions  Medication Sig Dispense Refill  . acetaminophen (TYLENOL) 500 MG tablet Take 500 mg by mouth every 6 (six) hours as needed for  mild pain or moderate pain. Reported on 12/12/2015    . diltiazem (CARDIZEM CD) 120 MG 24 hr capsule Take 1 capsule (120 mg total) by mouth daily. 90 capsule 3  . ELIQUIS 5 MG TABS tablet TAKE 1 TABLET (5 MG TOTAL) BY MOUTH 2 (TWO) TIMES DAILY. 180 tablet 0  . flecainide (TAMBOCOR) 100 MG tablet Take 1 tablet (100 mg total) by mouth 2 (two) times daily. 60 tablet 5  . ondansetron (ZOFRAN) 4 MG tablet Take 1 tablet (4 mg total) by mouth every 6 (six) hours. 12 tablet 0  . oxyCODONE-acetaminophen (PERCOCET/ROXICET) 5-325 MG tablet Take 2 tablets by mouth every 4 (four) hours as needed for severe pain. 15 tablet 0  . rizatriptan (MAXALT-MLT) 10 MG disintegrating tablet DISSOLVE 1 TAB BY MOUTH AS SOON AS HEADACHE STARTS. MAY REPEAT IN 2 HOURS IF NO RELIEF. 9 tablet 2  . tamsulosin (FLOMAX) 0.4 MG CAPS capsule Take 1 capsule (0.4 mg total) by mouth daily after supper. 7 capsule 0   No current facility-administered medications for this visit.     Allergies:   Asa [aspirin]    Social History:  The patient  reports that he has never smoked. He has never used smokeless tobacco. He reports that he does not drink alcohol or use  drugs.   Family History:  The patient's family history includes Atrial fibrillation in his mother; Cancer in his sister; Diabetes type II in his sister; Heart disease in his maternal uncle; Leukemia in his father; Lymphoma in his mother.    ROS:  Please see the history of present illness.   Otherwise, review of systems are positive for Fatigue and palpitations with atrial fibrillation.   All other systems are reviewed and negative.    PHYSICAL EXAM: VS:  BP 118/72   Pulse (!) 101   Ht 6\' 2"  (1.88 m)   Wt 204 lb (92.5 kg)   BMI 26.19 kg/m  , BMI Body mass index is 26.19 kg/m. GEN: Well nourished, well developed, in no acute distress  HEENT: normal  Neck: no JVD, carotid bruits, or masses Cardiac: Irregularly irregular; no murmurs, rubs, or gallops,no edema  Respiratory:   clear to auscultation bilaterally, normal work of breathing GI: soft, nontender, nondistended, + BS MS: no deformity or atrophy  Skin: warm and dry, no rash Neuro:  Strength and sensation are intact Psych: euthymic mood, full affect   EKG:   The ekg ordered today demonstrates atrial fibrillation with borderline ventricular response   Recent Labs: No results found for requested labs within last 8760 hours.   Lipid Panel    Component Value Date/Time   CHOL 171 05/22/2014 0330   TRIG 117 05/22/2014 0330   HDL 46 05/22/2014 0330   CHOLHDL 3.7 05/22/2014 0330   VLDL 23 05/22/2014 0330   LDLCALC 102 (H) 05/22/2014 0330   LDLDIRECT 96.1 09/02/2013 0839     Other studies Reviewed: Additional studies/ records that were reviewed today with results demonstrating: Echocardiogram showed ejection fraction of 50-55% in October 2015.   ASSESSMENT AND PLAN:  1. AFib: Paroxysmal. Back and atrial fibrillation. Continue flecainide and diltiazem.  Continue Eliquis.  We discussed cardioversion and he is agreeable.  Risks and benefits explained to the patient and he is agreeable.  First available appointment was next Thursday. Hopefully, he will cardiovert before then. 2. Fatigue: Should resolve when NSR is restored.    Current medicines are reviewed at length with the patient today.  The patient concerns regarding his medicines were addressed.  The following changes have been made:  No change  Labs/ tests ordered today include: pre DCCV labs No orders of the defined types were placed in this encounter.   Recommend 150 minutes/week of aerobic exercise Low fat, low carb, high fiber diet recommended  Disposition:   FU in post cardioversion   Signed, Larae Grooms, MD  04/18/2016 2:09 PM    [facility] [place], [place], [place]  [address] Phone: [phone]; Fax: [phone]

## 2016-04-18 NOTE — Telephone Encounter (Signed)
Pt also requesting a refill on his cardizem CD 120 mg po daily to be sent to his confirmed pharmacy of choice.  Sent pts refill for this medication as requested, for he will run out of this medication after today.

## 2016-04-18 NOTE — Telephone Encounter (Signed)
Spoke with the pt and informed him that I spoke with our DOD Dr Irish Lack about complaints of being back in afib.  Informed the pt that per Dr Irish Lack, its ok to add him to his schedule today for 1:45 pm, here at our office.  Advised the pt to arrive at 1:30 pm.  Will route this note to Dr [name], to make her aware of this issue and adding the pt on to the DOD schedule.  Pt verbalized understanding, agrees with this plan, and gracious for all the assistance provided.

## 2016-04-18 NOTE — Telephone Encounter (Signed)
Pt calling in to request to see a Provider for complaints of being back in afib.  Pt states he has not been back in afib since he saw Dr [name] almost a year ago.  Pt states he is taking all his meds as prescribed.  Pt states that he can tell he's back in afib, for he feels his HR is irregular, unknown rate, he has mild sob that comes and goes, and he is feeling very fatigued.  Pt states that he did have an occurrence where he had to report to the ER on Monday 9/11, for a kidney stone.  Pt reports this is the first time he has ever had this occur, and they found a 4 mm kidney stone through ordered images in the ER.  Pt noted that once he was discharged from the ER with appropriate meds to treat his kidney stone, he immediately felt himself go back in afib.  Pt is concerned about this, for his afib has been well controlled with medication use.  Pt has been set up for multiple cardioversion's in the past, but spontaneously converted himself to NSR with his anticoagulation and anti-arrhythmic medications, upon report to the hospital back in 2016 for scheduled procedures.  Pt reports this happened on 2 different occassions.  Pt states he's been well controlled with all his cardiac meds prescribed for this reason, until now.  Pt feels like the kidney stone may have been the reason why he had an acute exacerbation of his afib reoccurring over the past 2 days.  Informed the pt that Dr [name] is currently out of the office this week, but I will go and speak with the DOD for further review of complaints and recommendations, and follow-up with the pt thereafter.  Pt verbalized understanding and agrees with this plan.

## 2016-04-18 NOTE — Patient Instructions (Addendum)
Medication Instructions:  Your physician recommends that you continue on your current medications as directed. Please refer to the Current Medication list given to you today.  Labwork: Your physician recommends that you have lab work today: BMET, CBC, and PT/INR  Testing/Procedures: Your physician has recommended that you have a Cardioversion (DCCV). Electrical Cardioversion uses a jolt of electricity to your heart either through paddles or wired patches attached to your chest. This is a controlled, usually prescheduled, procedure. Defibrillation is done under light anesthesia in the hospital, and you usually go home the day of the procedure. This is done to get your heart back into a normal rhythm. You are not awake for the procedure. Please see the instruction sheet given to you today.  Follow-Up: Your physician wants you to follow-up in: November with Dr. [name] per recall.  If you need a refill on your cardiac medications before your next appointment, please call your pharmacy.

## 2016-04-19 LAB — PROTIME-INR
INR: 1
Prothrombin Time: 10.6 s (ref 9.0–11.5)

## 2016-04-24 ENCOUNTER — Other Ambulatory Visit (HOSPITAL_COMMUNITY): Payer: Self-pay | Admitting: Interventional Cardiology

## 2016-04-24 DIAGNOSIS — I480 Paroxysmal atrial fibrillation: Secondary | ICD-10-CM

## 2016-04-26 ENCOUNTER — Encounter (HOSPITAL_COMMUNITY): Payer: Self-pay | Admitting: Anesthesiology

## 2016-04-26 ENCOUNTER — Encounter (HOSPITAL_COMMUNITY)
Admission: RE | Disposition: A | Discharge status: Home / Self Care | Payer: Self-pay | Source: Ambulatory Visit | Attending: Internal Medicine

## 2016-04-26 ENCOUNTER — Ambulatory Visit (HOSPITAL_COMMUNITY)
Admission: RE | Admit: 2016-04-26 | Discharge: 2016-04-26 | Disposition: A | Discharge status: Home / Self Care | Payer: BLUE CROSS/BLUE SHIELD | Source: Ambulatory Visit | Attending: Internal Medicine | Admitting: Internal Medicine

## 2016-04-26 DIAGNOSIS — E669 Obesity, unspecified: Secondary | ICD-10-CM | POA: Insufficient documentation

## 2016-04-26 DIAGNOSIS — R5383 Other fatigue: Secondary | ICD-10-CM | POA: Insufficient documentation

## 2016-04-26 DIAGNOSIS — K219 Gastro-esophageal reflux disease without esophagitis: Secondary | ICD-10-CM | POA: Diagnosis not present

## 2016-04-26 DIAGNOSIS — I480 Paroxysmal atrial fibrillation: Secondary | ICD-10-CM | POA: Insufficient documentation

## 2016-04-26 DIAGNOSIS — I4891 Unspecified atrial fibrillation: Secondary | ICD-10-CM | POA: Diagnosis present

## 2016-04-26 DIAGNOSIS — I10 Essential (primary) hypertension: Secondary | ICD-10-CM | POA: Insufficient documentation

## 2016-04-26 DIAGNOSIS — Z7901 Long term (current) use of anticoagulants: Secondary | ICD-10-CM | POA: Insufficient documentation

## 2016-04-26 DIAGNOSIS — Z8249 Family history of ischemic heart disease and other diseases of the circulatory system: Secondary | ICD-10-CM | POA: Diagnosis not present

## 2016-04-26 SURGERY — CANCELLED PROCEDURE
Anesthesia: Monitor Anesthesia Care

## 2016-04-26 NOTE — H&P (View-Only) (Signed)
Cardiology Office Note   Date:  04/18/2016   ID:  JAYCIEON VALDERRAMA, DOB 04-20-1956, MRN [id number]  PCP:  Garret Reddish, MD    Chief Complaint  Patient presents with  . Follow-up     Wt Readings from Last 3 Encounters:  04/18/16 204 lb (92.5 kg)  04/16/16 197 lb (89.4 kg)  12/12/15 204 lb (92.5 kg)       History of Present Illness: ARNESH BANDT is a 60 y.o. male  with a history of obesity and paroxysmal atrial fibrillation. He has lost significant weight over the past couple of years. Within the last 2 years, he was started on flecainide.  Recently, he had a kidney stone. During the kidney stone episode, he felt himself go back and atrial fibrillation. He is here for further evaluation.  He has maintained his flecainide and Eliquis use. He has not missed any doses. Today, he felt a little bit better. Her heart rate seemed to be less but he didn't think he was back in normal rhythm. He is not having any chest discomfort.    Past Medical History:  Diagnosis Date  . Actinic keratosis 10/24/2009  . ALLERGIC RHINITIS 05/11/2007  . ELEVATED BLOOD PRESSURE WITHOUT DIAGNOSIS OF HYPERTENSION 11/12/2007   Dr. Debara Pickett mentioned HTN on problem list  . Esophageal stricture    treated with protonix  . GERD 05/11/2007  . GI bleeding    20 years ago  . Melanoma ([place]) 01/2014   remoted from forehead  . MIGRAINE HEADACHE 05/11/2007  . Overweight   . Persistent atrial fibrillation ([place]) 05/2014  . Snores     Past Surgical History:  Procedure Laterality Date  . ANKLE FRACTURE SURGERY  2010   x2-left  . CARDIOVERSION N/A 06/23/2014  . CARDIOVERSION N/A 09/09/2014  . malignant melanoma     removed from forehead in June 2015  . STRABISMUS SURGERY Right 05/21/2014   Dr. Annamaria Boots  . UPPER GI ENDOSCOPY     stricture, just PPI     Current Outpatient Prescriptions  Medication Sig Dispense Refill  . acetaminophen (TYLENOL) 500 MG tablet Take 500 mg by mouth every 6 (six) hours as needed for  mild pain or moderate pain. Reported on 12/12/2015    . diltiazem (CARDIZEM CD) 120 MG 24 hr capsule Take 1 capsule (120 mg total) by mouth daily. 90 capsule 3  . ELIQUIS 5 MG TABS tablet TAKE 1 TABLET (5 MG TOTAL) BY MOUTH 2 (TWO) TIMES DAILY. 180 tablet 0  . flecainide (TAMBOCOR) 100 MG tablet Take 1 tablet (100 mg total) by mouth 2 (two) times daily. 60 tablet 5  . ondansetron (ZOFRAN) 4 MG tablet Take 1 tablet (4 mg total) by mouth every 6 (six) hours. 12 tablet 0  . oxyCODONE-acetaminophen (PERCOCET/ROXICET) 5-325 MG tablet Take 2 tablets by mouth every 4 (four) hours as needed for severe pain. 15 tablet 0  . rizatriptan (MAXALT-MLT) 10 MG disintegrating tablet DISSOLVE 1 TAB BY MOUTH AS SOON AS HEADACHE STARTS. MAY REPEAT IN 2 HOURS IF NO RELIEF. 9 tablet 2  . tamsulosin (FLOMAX) 0.4 MG CAPS capsule Take 1 capsule (0.4 mg total) by mouth daily after supper. 7 capsule 0   No current facility-administered medications for this visit.     Allergies:   Asa [aspirin]    Social History:  The patient  reports that he has never smoked. He has never used smokeless tobacco. He reports that he does not drink alcohol or use  drugs.   Family History:  The patient's family history includes Atrial fibrillation in his mother; Cancer in his sister; Diabetes type II in his sister; Heart disease in his maternal uncle; Leukemia in his father; Lymphoma in his mother.    ROS:  Please see the history of present illness.   Otherwise, review of systems are positive for Fatigue and palpitations with atrial fibrillation.   All other systems are reviewed and negative.    PHYSICAL EXAM: VS:  BP 118/72   Pulse (!) 101   Ht 6\' 2"  (1.88 m)   Wt 204 lb (92.5 kg)   BMI 26.19 kg/m  , BMI Body mass index is 26.19 kg/m. GEN: Well nourished, well developed, in no acute distress  HEENT: normal  Neck: no JVD, carotid bruits, or masses Cardiac: Irregularly irregular; no murmurs, rubs, or gallops,no edema  Respiratory:   clear to auscultation bilaterally, normal work of breathing GI: soft, nontender, nondistended, + BS MS: no deformity or atrophy  Skin: warm and dry, no rash Neuro:  Strength and sensation are intact Psych: euthymic mood, full affect   EKG:   The ekg ordered today demonstrates atrial fibrillation with borderline ventricular response   Recent Labs: No results found for requested labs within last 8760 hours.   Lipid Panel    Component Value Date/Time   CHOL 171 05/22/2014 0330   TRIG 117 05/22/2014 0330   HDL 46 05/22/2014 0330   CHOLHDL 3.7 05/22/2014 0330   VLDL 23 05/22/2014 0330   LDLCALC 102 (H) 05/22/2014 0330   LDLDIRECT 96.1 09/02/2013 0839     Other studies Reviewed: Additional studies/ records that were reviewed today with results demonstrating: Echocardiogram showed ejection fraction of 50-55% in October 2015.   ASSESSMENT AND PLAN:  1. AFib: Paroxysmal. Back and atrial fibrillation. Continue flecainide and diltiazem.  Continue Eliquis.  We discussed cardioversion and he is agreeable.  Risks and benefits explained to the patient and he is agreeable.  First available appointment was next Thursday. Hopefully, he will cardiovert before then. 2. Fatigue: Should resolve when NSR is restored.    Current medicines are reviewed at length with the patient today.  The patient concerns regarding his medicines were addressed.  The following changes have been made:  No change  Labs/ tests ordered today include: pre DCCV labs No orders of the defined types were placed in this encounter.   Recommend 150 minutes/week of aerobic exercise Low fat, low carb, high fiber diet recommended  Disposition:   FU in post cardioversion   Signed, Larae Grooms, MD  04/18/2016 2:09 PM    [facility] [place], [place], [place]  [address] Phone: [phone]; Fax: [phone]

## 2016-04-26 NOTE — Interval H&P Note (Signed)
History and Physical Interval Note:  04/26/2016 10:39 AM  Jorge Kelly  has presented today for surgery, with the diagnosis of afib  The various methods of treatment have been discussed with the patient and family. After consideration of risks, benefits and other options for treatment, the patient has consented to  Procedure(s): CARDIOVERSION (N/A) as a surgical intervention .  The patient's history has been reviewed, patient examined, no change in status, stable for surgery.  I have reviewed the patient's chart and labs.  Questions were answered to the patient's satisfaction.     Dorris Carnes

## 2016-04-26 NOTE — Op Note (Signed)
Procedure cancelled   Pt presented in SR.  12 lead EKG done Pt will have f/u in clinic

## 2016-04-26 NOTE — Anesthesia Preprocedure Evaluation (Deleted)
Anesthesia Evaluation  Patient identified by MRN, date of birth, ID band Patient awake    Reviewed: Allergy & Precautions, H&P , NPO status , Patient's Chart, lab work & pertinent test results  Airway Mallampati: II  TM Distance: >3 FB Neck ROM: Full    Dental  (+) Teeth Intact, Dental Advisory Given   Pulmonary neg pulmonary ROS,           Cardiovascular negative cardio ROS  + dysrhythmias Atrial Fibrillation  Rhythm:Irregular Rate:Abnormal     Neuro/Psych  Headaches, negative psych ROS   GI/Hepatic Neg liver ROS, GERD  Medicated,  Endo/Other  negative endocrine ROS  Renal/GU negative Renal ROS  negative genitourinary   Musculoskeletal negative musculoskeletal ROS (+)   Abdominal   Peds negative pediatric ROS (+)  Hematology negative hematology ROS (+)   Anesthesia Other Findings   Reproductive/Obstetrics negative OB ROS                            Lab Results  Component Value Date   WBC 6.7 04/18/2016   HGB 15.0 04/18/2016   HCT 44.0 04/18/2016   MCV 91.7 04/18/2016   PLT 166 04/18/2016   Lab Results  Component Value Date   CREATININE 0.83 04/18/2016   BUN 15 04/18/2016   NA 142 04/18/2016   K 3.9 04/18/2016   CL 106 04/18/2016   CO2 26 04/18/2016   Lab Results  Component Value Date   INR 1.0 04/18/2016   INR 2.1 (H) 11/30/2014   INR 2.0 (H) 11/15/2014   04/2016 EKG: atrial fibrillation.   Anesthesia Physical  Anesthesia Plan  ASA: III  Anesthesia Plan: MAC   Post-op Pain Management:    Induction: Intravenous  Airway Management Planned: Mask and Natural Airway  Additional Equipment:   Intra-op Plan:   Post-operative Plan:   Informed Consent: I have reviewed the patients History and Physical, chart, labs and discussed the procedure including the risks, benefits and alternatives for the proposed anesthesia with the patient or authorized representative who  has indicated his/her understanding and acceptance.     Plan Discussed with: CRNA and Surgeon  Anesthesia Plan Comments:        Anesthesia Quick Evaluation

## 2016-04-26 NOTE — Progress Notes (Signed)
Jorge Kelly came in today for an outpatient cardioversion. Jorge Kelly presents in a normal sinus rhythm. Dr. Harrington Challenger was notified and she ordered a 12 lead EKG which confirms NSR. Jorge Kelly is instructed to keep his follow-up appointment with his existing cardiologist.

## 2016-07-04 ENCOUNTER — Encounter (INDEPENDENT_AMBULATORY_CARE_PROVIDER_SITE_OTHER): Payer: Self-pay

## 2016-07-04 ENCOUNTER — Ambulatory Visit (INDEPENDENT_AMBULATORY_CARE_PROVIDER_SITE_OTHER): Payer: BLUE CROSS/BLUE SHIELD | Admitting: Cardiology

## 2016-07-04 ENCOUNTER — Encounter: Payer: Self-pay | Admitting: Cardiology

## 2016-07-04 VITALS — BP 128/82 | HR 84 | Ht 74.0 in | Wt 206.0 lb

## 2016-07-04 DIAGNOSIS — I480 Paroxysmal atrial fibrillation: Secondary | ICD-10-CM

## 2016-07-04 DIAGNOSIS — Z79899 Other long term (current) drug therapy: Secondary | ICD-10-CM

## 2016-07-04 DIAGNOSIS — I10 Essential (primary) hypertension: Secondary | ICD-10-CM

## 2016-07-04 DIAGNOSIS — Z5181 Encounter for therapeutic drug level monitoring: Secondary | ICD-10-CM

## 2016-07-04 MED ORDER — DILTIAZEM HCL ER COATED BEADS 120 MG PO CP24: 120.0000 mg | ORAL_CAPSULE | Freq: Every day | ORAL | 3 refills | Status: DC

## 2016-07-04 MED ORDER — FLECAINIDE ACETATE 100 MG PO TABS: 100.0000 mg | ORAL_TABLET | Freq: Two times a day (BID) | ORAL | 3 refills | Status: DC

## 2016-07-04 MED ORDER — APIXABAN 5 MG PO TABS: 5.0000 mg | ORAL_TABLET | Freq: Two times a day (BID) | ORAL | 3 refills | Status: DC

## 2016-07-04 NOTE — Patient Instructions (Signed)

## 2016-07-04 NOTE — Progress Notes (Signed)
Cardiology Office Note   Date:  07/04/2016   ID:  Jorge Kelly, DOB April 27, 1956, MRN [id number]  PCP:  Garret Reddish, MD    Wt Readings from Last 3 Encounters:  07/04/16 206 lb (93.4 kg)  04/18/16 204 lb (92.5 kg)  04/16/16 197 lb (89.4 kg)       History of Present Illness: Jorge Kelly is a 60 y.o. male  with a history of paroxysmal atrial fibrillation. He has lost significant weight over the past couple of years. Within the last 2 years, he was started on flecainide.  Recently, he had a kidney stone. During the kidney stone episode, he felt himself go back and atrial fibrillation. He is here for further evaluation.  He has maintained his flecainide and Eliquis use. He has not missed any doses. Today, he felt a little bit better. Her heart rate seemed to be less but he didn't think he was back in normal rhythm. He is not having any chest discomfort.  07/04/2016 - the patient was seen in September urgently he went back relation. The psoas after he was treated for kidney stones. He was scheduled for cardioversion shortly after that visit but when he presented to the hospital he was back in sinus rhythm. Today he is coming after 2 months, he continues to be very active, he exercises on treadmill twice a day for 20 minutes with no symptoms of chest pain shortness of breath palpitation or syncope. He spends every weekend sailing competitively with his sons. He has lost overall 50 pounds and feels great.  Past Medical History:  Diagnosis Date  . Actinic keratosis 10/24/2009  . ALLERGIC RHINITIS 05/11/2007  . ELEVATED BLOOD PRESSURE WITHOUT DIAGNOSIS OF HYPERTENSION 11/12/2007   Dr. Debara Pickett mentioned HTN on problem list  . Esophageal stricture    treated with protonix  . GERD 05/11/2007  . GI bleeding    20 years ago  . Melanoma ([place]) 01/2014   remoted from forehead  . MIGRAINE HEADACHE 05/11/2007  . Overweight   . Persistent atrial fibrillation ([place]) 05/2014  . Snores     Past  Surgical History:  Procedure Laterality Date  . ANKLE FRACTURE SURGERY  2010   x2-left  . CARDIOVERSION N/A 06/23/2014  . CARDIOVERSION N/A 09/09/2014  . malignant melanoma     removed from forehead in June 2015  . STRABISMUS SURGERY Right 05/21/2014   Dr. Annamaria Boots  . UPPER GI ENDOSCOPY     stricture, just PPI     Current Outpatient Prescriptions  Medication Sig Dispense Refill  . acetaminophen (TYLENOL) 500 MG tablet Take 500 mg by mouth every 6 (six) hours as needed for mild pain or moderate pain. Reported on 12/12/2015    . apixaban (ELIQUIS) 5 MG TABS tablet Take 1 tablet (5 mg total) by mouth 2 (two) times daily. 180 tablet 3  . diltiazem (CARDIZEM CD) 120 MG 24 hr capsule Take 1 capsule (120 mg total) by mouth daily. 90 capsule 3  . flecainide (TAMBOCOR) 100 MG tablet Take 1 tablet (100 mg total) by mouth 2 (two) times daily. 180 tablet 3  . ondansetron (ZOFRAN) 4 MG tablet Take 1 tablet (4 mg total) by mouth every 6 (six) hours. 12 tablet 0  . oxyCODONE-acetaminophen (PERCOCET/ROXICET) 5-325 MG tablet Take 2 tablets by mouth every 4 (four) hours as needed for severe pain. 15 tablet 0  . rizatriptan (MAXALT-MLT) 10 MG disintegrating tablet DISSOLVE 1 TAB BY MOUTH AS SOON AS HEADACHE STARTS.  MAY REPEAT IN 2 HOURS IF NO RELIEF. 9 tablet 2  . tamsulosin (FLOMAX) 0.4 MG CAPS capsule Take 1 capsule (0.4 mg total) by mouth daily after supper. 7 capsule 0   No current facility-administered medications for this visit.     Allergies:   Patient has no known allergies.    Social History:  The patient  reports that he has never smoked. He has never used smokeless tobacco. He reports that he does not drink alcohol or use drugs.   Family History:  The patient's family history includes Atrial fibrillation in his mother; Cancer in his sister; Diabetes type II in his sister; Heart disease in his maternal uncle; Leukemia in his father; Lymphoma in his mother.    ROS:  Please see the history of  present illness.   Otherwise, review of systems are positive for Fatigue and palpitations with atrial fibrillation.   All other systems are reviewed and negative.    PHYSICAL EXAM: VS:  BP 128/82   Pulse 84   Ht 6\' 2"  (1.88 m)   Wt 206 lb (93.4 kg)   BMI 26.45 kg/m  , BMI Body mass index is 26.45 kg/m. GEN: Well nourished, well developed, in no acute distress  HEENT: normal  Neck: no JVD, carotid bruits, or masses Cardiac: Irregularly irregular; no murmurs, rubs, or gallops,no edema  Respiratory:  clear to auscultation bilaterally, normal work of breathing GI: soft, nontender, nondistended, + BS MS: no deformity or atrophy  Skin: warm and dry, no rash Neuro:  Strength and sensation are intact Psych: euthymic mood, full affect   EKG:   The ekg ordered today demonstrates atrial fibrillation with borderline ventricular response   Recent Labs: 04/18/2016: BUN 15; Creat 0.83; Hemoglobin 15.0; Platelets 166; Potassium 3.9; Sodium 142   Lipid Panel    Component Value Date/Time   CHOL 171 05/22/2014 0330   TRIG 117 05/22/2014 0330   HDL 46 05/22/2014 0330   CHOLHDL 3.7 05/22/2014 0330   VLDL 23 05/22/2014 0330   LDLCALC 102 (H) 05/22/2014 0330   LDLDIRECT 96.1 09/02/2013 0839     Other studies Reviewed: Additional studies/ records that were reviewed today with results demonstrating: Echocardiogram showed ejection fraction of 50-55% in October 2015.   ASSESSMENT AND PLAN:  1. AFib: Paroxysmal. Back In sinus rhythm, the last episode triggered by kidney stone. We will continue the same medications, at the next visit if he has no more episodes he might consider dropping flecainide. He is concerned about long-term side effects. 2. Continue Eliquis.  He has no bleeding. 3. Fatigue: Should resolve when NSR is restored.  4. Essential hypertension - controlled.   Current medicines are reviewed at length with the patient today.  The patient concerns regarding his medicines were  addressed.  The following changes have been made:  No change  Labs/ tests ordered today include: pre DCCV labs  Orders Placed This Encounter  Procedures  . EKG 12-Lead    Recommend 150 minutes/week of aerobic exercise Low fat, low carb, high fiber diet recommended  Disposition:   FU in post cardioversion   Signed, Ena Dawley, MD  07/04/2016 9:45 AM    [facility] [address], [place], [place]  [address] Phone: [phone]; Fax: [phone]

## 2016-07-11 ENCOUNTER — Other Ambulatory Visit: Payer: Self-pay | Admitting: Internal Medicine

## 2016-07-11 ENCOUNTER — Other Ambulatory Visit: Payer: Self-pay | Admitting: Cardiology

## 2016-07-16 ENCOUNTER — Other Ambulatory Visit: Payer: Self-pay | Admitting: Cardiology

## 2016-07-16 ENCOUNTER — Other Ambulatory Visit: Payer: Self-pay | Admitting: Internal Medicine

## 2016-10-23 ENCOUNTER — Encounter: Payer: Self-pay | Admitting: Family Medicine

## 2016-10-23 ENCOUNTER — Ambulatory Visit (INDEPENDENT_AMBULATORY_CARE_PROVIDER_SITE_OTHER): Payer: BLUE CROSS/BLUE SHIELD | Admitting: Family Medicine

## 2016-10-23 VITALS — BP 108/72 | HR 90 | Temp 98.4°F | Ht 74.0 in | Wt 207.8 lb

## 2016-10-23 DIAGNOSIS — M79621 Pain in right upper arm: Secondary | ICD-10-CM

## 2016-10-23 DIAGNOSIS — Z23 Encounter for immunization: Secondary | ICD-10-CM | POA: Diagnosis not present

## 2016-10-23 NOTE — Progress Notes (Signed)
Subjective:  Jorge Kelly is a 61 y.o. year old very pleasant male patient who presents for/with See problem oriented charting ROS- no fever, chills, nausea, vomiting. No abnormal fatigue or weight loss   Past Medical History-  Patient Active Problem List   Diagnosis Date Noted  . PAF (paroxysmal atrial fibrillation) ([place]) 05/21/2014    Priority: High  . Inguinal hernia 12/10/2014    Priority: Medium  . Melanoma ([place]) 01/04/2014    Priority: Medium  . Migraine 05/11/2007    Priority: Medium  . Snoring 08/25/2014    Priority: Low  . Overweight 08/25/2014    Priority: Low  . 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 Actinic keratosis 10/24/2009    Priority: Low  . GERD 05/11/2007    Priority: Low    Medications- reviewed and updated Current Outpatient Prescriptions  Medication Sig Dispense Refill  . acetaminophen (TYLENOL) 500 MG tablet Take 500 mg by mouth every 6 (six) hours as needed for mild pain or moderate pain. Reported on 12/12/2015    . apixaban (ELIQUIS) 5 MG TABS tablet Take 1 tablet (5 mg total) by mouth 2 (two) times daily. 180 tablet 3  . diltiazem (CARDIZEM CD) 120 MG 24 hr capsule Take 1 capsule (120 mg total) by mouth daily. 90 capsule 3  . flecainide (TAMBOCOR) 100 MG tablet Take 1 tablet (100 mg total) by mouth 2 (two) times daily. 180 tablet 3  . ondansetron (ZOFRAN) 4 MG tablet Take 1 tablet (4 mg total) by mouth every 6 (six) hours. 12 tablet 0  . oxyCODONE-acetaminophen (PERCOCET/ROXICET) 5-325 MG tablet Take 2 tablets by mouth every 4 (four) hours as needed for severe pain. 15 tablet 0  . rizatriptan (MAXALT-MLT) 10 MG disintegrating tablet DISSOLVE 1 TAB BY MOUTH AS SOON AS HEADACHE STARTS. MAY REPEAT IN 2 HOURS IF NO RELIEF. 9 tablet 2  . tamsulosin (FLOMAX) 0.4 MG CAPS capsule Take 1 capsule (0.4 mg total) by mouth daily after supper. 7 capsule 0   No current facility-administered medications for this visit.     Objective: BP 108/72 (BP  Location: Left Arm, Patient Position: Sitting, Cuff Size: Large)   Pulse 90   Temp 98.4 F (36.9 C) (Oral)   Ht 6\' 2"  (1.88 m)   Wt 207 lb 12.8 oz (94.3 kg)   SpO2 97%   BMI 26.68 kg/m  Gen: NAD, resting comfortably CV: RRR no murmurs rubs or gallops Lungs: CTAB no crackles, wheeze, rhonchi Right chest exam: normal appearance, no masses or tenderness Right axilla: no lymphadenopathy. Some tenderness with deep palpation lateral portion of axilla but without clear mass. Symmetrical compared to left but no pain on left.  Skin: warm, dry, no rash in axilla Neuro: no weakness in upper extremities  Assessment/Plan:  Pain in right axilla S: 2 weeks ago started with a pain in right axilla. In the past has had similar pain- and had been associated with deodorant. Stopped his deodorant but no improvement. Was painful Saturday night in particular when working out in the yard. Pain at first was intense enough to wake him up 2 weeks ago. Did cut left finger around time it started. Getting better. Pain 0.5/10 at present at worst has been 4/10. Improving daily. Gets worse with activity better with rest.   Has been rowing a lot lately. At times has felt enlarged but at present not really.   Denies recent infections. No rash on the arm. History of melanoma- no  spots on the arms though at present. No pain through right chest. No cat bites. No recent cat scratches A/P: no evidence of lymphadenopathy. Suspect MSK pain- ok to watch with time given pain so minimal at this point. My biggest concern if lymphadenopathy was present and persisted is his melanoma history.  From avs "I think this could be a muscle strain  I do not see clear evidence of any lymph node enlargement.   Generally with lymph nodes ok to watch up to 3-4 weeks and then consider further workup and since you think you only felt it one day- likely would not be beneficial at present to do ultrasound or surgery  Lets watch this for another  3-4 weeks. Would consider sports medicine referral if no clear lymph node enlarged"  Orders Placed This Encounter  Procedures  . Flu Vaccine QUAD 36+ mos IM   Return precautions advised.  Garret Reddish, MD

## 2016-10-23 NOTE — Patient Instructions (Signed)
I think this could be a muscle strain  I do not see clear evidence of any lymph node enlargement.   Generally with lymph nodes ok to watch up to 3-4 weeks and then consider further workup and since you think you only felt it one day- likely would not be beneficial at present to do ultrasound or surgery  Lets watch this for another 3-4 weeks. Would consider sports medicine referral if no clear lymph node enlarged

## 2016-10-23 NOTE — Progress Notes (Signed)
Pre visit review using our clinic review tool, if applicable. No additional management support is needed unless otherwise documented below in the visit note. 

## 2016-11-25 ENCOUNTER — Other Ambulatory Visit: Payer: Self-pay | Admitting: Family Medicine

## 2017-05-08 ENCOUNTER — Encounter: Payer: Self-pay | Admitting: Family Medicine

## 2017-05-08 ENCOUNTER — Ambulatory Visit (INDEPENDENT_AMBULATORY_CARE_PROVIDER_SITE_OTHER): Payer: BLUE CROSS/BLUE SHIELD | Admitting: Family Medicine

## 2017-05-08 VITALS — BP 106/72 | HR 79 | Temp 98.4°F | Ht 74.0 in | Wt 205.0 lb

## 2017-05-08 DIAGNOSIS — Z23 Encounter for immunization: Secondary | ICD-10-CM

## 2017-05-08 DIAGNOSIS — K253 Acute gastric ulcer without hemorrhage or perforation: Secondary | ICD-10-CM | POA: Diagnosis not present

## 2017-05-08 MED ORDER — OMEPRAZOLE 40 MG PO CPDR: 40.0000 mg | DELAYED_RELEASE_CAPSULE | Freq: Every day | ORAL | 1 refills | Status: DC

## 2017-05-08 NOTE — Patient Instructions (Signed)
Agree with you- very high likelihood of recurrent gastric ulcer  Take prilosec daily for next 30 days minimum- if symptoms not 100% better continue for extra month. If not improving within a few weeks or worsen please let us know

## 2017-05-08 NOTE — Progress Notes (Signed)
Subjective:  Jorge Kelly is a 61 y.o. year old very pleasant male patient who presents for/with See problem oriented charting ROS- no fever, chills, nausea, vomiting. Does have epigastric abdominal pain. Worse with meals. No palpitations.   Past Medical History-  Patient Active Problem List   Diagnosis Date Noted  . PAF (paroxysmal atrial fibrillation) ([employer]) 05/21/2014    Priority: High  . Inguinal hernia 12/10/2014    Priority: Medium  . Melanoma ([place]) 01/04/2014    Priority: Medium  . Migraine 05/11/2007    Priority: Medium  . Snoring 08/25/2014    Priority: Low  . Overweight 08/25/2014    Priority: Low  . 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 Actinic keratosis 10/24/2009    Priority: Low  . GERD 05/11/2007    Priority: Low    Medications- reviewed and updated Current Outpatient Prescriptions  Medication Sig Dispense Refill  . acetaminophen (TYLENOL) 500 MG tablet Take 500 mg by mouth every 6 (six) hours as needed for mild pain or moderate pain. Reported on 12/12/2015    . apixaban (ELIQUIS) 5 MG TABS tablet Take 1 tablet (5 mg total) by mouth 2 (two) times daily. 180 tablet 3  . diltiazem (CARDIZEM CD) 120 MG 24 hr capsule Take 1 capsule (120 mg total) by mouth daily. 90 capsule 3  . flecainide (TAMBOCOR) 100 MG tablet Take 1 tablet (100 mg total) by mouth 2 (two) times daily. 180 tablet 3  . ondansetron (ZOFRAN) 4 MG tablet Take 1 tablet (4 mg total) by mouth every 6 (six) hours. 12 tablet 0  . oxyCODONE-acetaminophen (PERCOCET/ROXICET) 5-325 MG tablet Take 2 tablets by mouth every 4 (four) hours as needed for severe pain. 15 tablet 0  . rizatriptan (MAXALT-MLT) 10 MG disintegrating tablet DISSOLVE 1 TAB BY MOUTH AS SOON AS HEADACHE STARTS. MAY REPEAT IN 2 HOURS IF NO RELIEF. 9 tablet 2  . tamsulosin (FLOMAX) 0.4 MG CAPS capsule Take 1 capsule (0.4 mg total) by mouth daily after supper. 7 capsule 0   Objective: BP 106/72 (BP Location: Left Arm, Patient  Position: Sitting, Cuff Size: Large)   Pulse 79   Temp 98.4 F (36.9 C) (Oral)   Ht 6\' 2"  (1.88 m)   Wt 205 lb (93 kg)   SpO2 98%   BMI 26.32 kg/m  Gen: NAD, resting comfortably CV: RRR no murmurs rubs or gallops Lungs: CTAB no crackles, wheeze, rhonchi Abdomen: Mild epigastric pain. Otherwise nontender. Nondistended. No rebound or guarding Ext: no edema Skin: warm, dry  Assessment/Plan:  Gastric ulcer  S: 20 years ago had stomach ulcer. Within the last week he has had symptoms which have been similar  Starting last week started with heartburn - splurging on weekends. Tums has been very helpful. On Monday, symptoms worsened. History of esophageal spasms. On Tuesday thought about going to the ER after he had small biscuit and symptoms worsened. Took prilosec, tums, [place] that day- symptoms improved from moderate to severe pain but lingering symptoms still present- Symptoms have changed from intermittent after meals to now constant. Pain level right now is mild 1/10. Worse when eating.  A/P: Epigastric pain worse after meals in patient with history of gastric ulcer years ago. This certainly seems like new gastric ulcer. Will treat with Prilosec 40 mg daily for 1-2 months. Follow-up precautions were given  Future Appointments Date Time Provider [place]  06/11/2017 11:00 AM [name], MD [facility] None    Orders Placed  This Encounter  Procedures  . Flu Vaccine QUAD 36+ mos IM    Meds ordered this encounter  Medications  . omeprazole (PRILOSEC) 40 MG capsule    Sig: Take 1 capsule (40 mg total) by mouth daily.    Dispense:  30 capsule    Refill:  1    Return precautions advised.  Garret Reddish, MD

## 2017-06-04 ENCOUNTER — Telehealth: Payer: Self-pay | Admitting: Family Medicine

## 2017-06-04 ENCOUNTER — Other Ambulatory Visit: Payer: Self-pay | Admitting: Cardiology

## 2017-06-04 ENCOUNTER — Other Ambulatory Visit: Payer: Self-pay

## 2017-06-04 DIAGNOSIS — I10 Essential (primary) hypertension: Secondary | ICD-10-CM

## 2017-06-04 DIAGNOSIS — Z79899 Other long term (current) drug therapy: Principal | ICD-10-CM

## 2017-06-04 DIAGNOSIS — Z5181 Encounter for therapeutic drug level monitoring: Secondary | ICD-10-CM

## 2017-06-04 DIAGNOSIS — I480 Paroxysmal atrial fibrillation: Secondary | ICD-10-CM

## 2017-06-04 MED ORDER — DILTIAZEM HCL ER COATED BEADS 120 MG PO CP24: 120.0000 mg | ORAL_CAPSULE | Freq: Every day | ORAL | 0 refills | Status: DC

## 2017-06-04 MED ORDER — OMEPRAZOLE 40 MG PO CPDR: 40.0000 mg | DELAYED_RELEASE_CAPSULE | Freq: Every day | ORAL | 1 refills | Status: DC

## 2017-06-04 NOTE — Telephone Encounter (Signed)
Prescription sent to pharmacy as requested.

## 2017-06-04 NOTE — Telephone Encounter (Signed)
Requesting 90 day supply of Omeprazole (PRILOSEC) 40 MG capsule [id number] [place], [place] [phone] (Phone) [phone] (Fax)   Last appointment 05/08/17 Next appointment 06/11/17

## 2017-06-11 ENCOUNTER — Encounter: Payer: BLUE CROSS/BLUE SHIELD | Admitting: Family Medicine

## 2017-07-08 ENCOUNTER — Other Ambulatory Visit: Payer: Self-pay | Admitting: Cardiology

## 2017-07-08 DIAGNOSIS — I480 Paroxysmal atrial fibrillation: Secondary | ICD-10-CM

## 2017-07-08 DIAGNOSIS — I10 Essential (primary) hypertension: Secondary | ICD-10-CM

## 2017-07-08 DIAGNOSIS — Z5181 Encounter for therapeutic drug level monitoring: Secondary | ICD-10-CM

## 2017-07-08 DIAGNOSIS — Z79899 Other long term (current) drug therapy: Principal | ICD-10-CM

## 2017-07-20 ENCOUNTER — Other Ambulatory Visit: Payer: Self-pay | Admitting: Cardiology

## 2017-07-20 DIAGNOSIS — Z79899 Other long term (current) drug therapy: Principal | ICD-10-CM

## 2017-07-20 DIAGNOSIS — I10 Essential (primary) hypertension: Secondary | ICD-10-CM

## 2017-07-20 DIAGNOSIS — Z5181 Encounter for therapeutic drug level monitoring: Secondary | ICD-10-CM

## 2017-07-20 DIAGNOSIS — I480 Paroxysmal atrial fibrillation: Secondary | ICD-10-CM

## 2017-07-27 ENCOUNTER — Other Ambulatory Visit: Payer: Self-pay | Admitting: Cardiology

## 2017-07-27 DIAGNOSIS — I10 Essential (primary) hypertension: Secondary | ICD-10-CM

## 2017-07-27 DIAGNOSIS — Z79899 Other long term (current) drug therapy: Principal | ICD-10-CM

## 2017-07-27 DIAGNOSIS — Z5181 Encounter for therapeutic drug level monitoring: Secondary | ICD-10-CM

## 2017-07-27 DIAGNOSIS — I480 Paroxysmal atrial fibrillation: Secondary | ICD-10-CM

## 2017-07-28 ENCOUNTER — Other Ambulatory Visit: Payer: Self-pay | Admitting: Cardiology

## 2017-07-28 DIAGNOSIS — Z79899 Other long term (current) drug therapy: Principal | ICD-10-CM

## 2017-07-28 DIAGNOSIS — I10 Essential (primary) hypertension: Secondary | ICD-10-CM

## 2017-07-28 DIAGNOSIS — I480 Paroxysmal atrial fibrillation: Secondary | ICD-10-CM

## 2017-07-28 DIAGNOSIS — Z5181 Encounter for therapeutic drug level monitoring: Secondary | ICD-10-CM

## 2017-08-19 ENCOUNTER — Other Ambulatory Visit: Payer: Self-pay | Admitting: Cardiology

## 2017-08-19 DIAGNOSIS — Z79899 Other long term (current) drug therapy: Principal | ICD-10-CM

## 2017-08-19 DIAGNOSIS — Z5181 Encounter for therapeutic drug level monitoring: Secondary | ICD-10-CM

## 2017-08-19 DIAGNOSIS — I10 Essential (primary) hypertension: Secondary | ICD-10-CM

## 2017-08-19 DIAGNOSIS — I480 Paroxysmal atrial fibrillation: Secondary | ICD-10-CM

## 2017-08-19 MED ORDER — FLECAINIDE ACETATE 100 MG PO TABS: 100.0000 mg | ORAL_TABLET | Freq: Two times a day (BID) | ORAL | 0 refills | Status: DC

## 2017-08-21 ENCOUNTER — Ambulatory Visit (INDEPENDENT_AMBULATORY_CARE_PROVIDER_SITE_OTHER): Payer: BLUE CROSS/BLUE SHIELD | Admitting: Cardiology

## 2017-08-21 ENCOUNTER — Encounter: Payer: Self-pay | Admitting: Cardiology

## 2017-08-21 DIAGNOSIS — I10 Essential (primary) hypertension: Secondary | ICD-10-CM | POA: Diagnosis not present

## 2017-08-21 DIAGNOSIS — Z5181 Encounter for therapeutic drug level monitoring: Secondary | ICD-10-CM | POA: Diagnosis not present

## 2017-08-21 DIAGNOSIS — Z79899 Other long term (current) drug therapy: Secondary | ICD-10-CM

## 2017-08-21 DIAGNOSIS — I480 Paroxysmal atrial fibrillation: Secondary | ICD-10-CM

## 2017-08-21 MED ORDER — APIXABAN 5 MG PO TABS: 5.0000 mg | ORAL_TABLET | Freq: Two times a day (BID) | ORAL | 11 refills | Status: DC

## 2017-08-21 MED ORDER — DILTIAZEM HCL ER COATED BEADS 120 MG PO CP24: 120.0000 mg | ORAL_CAPSULE | Freq: Every day | ORAL | 3 refills | Status: DC

## 2017-08-21 MED ORDER — OMEPRAZOLE 40 MG PO CPDR: 40.0000 mg | DELAYED_RELEASE_CAPSULE | Freq: Every day | ORAL | 3 refills | Status: DC

## 2017-08-21 MED ORDER — FLECAINIDE ACETATE 100 MG PO TABS: 100.0000 mg | ORAL_TABLET | Freq: Two times a day (BID) | ORAL | 3 refills | Status: DC

## 2017-08-21 MED ORDER — DILTIAZEM HCL 60 MG PO TABS: 60.0000 mg | ORAL_TABLET | ORAL | 3 refills | Status: DC | PRN

## 2017-08-21 NOTE — Progress Notes (Signed)
Cardiology Office Note   Date:  08/21/2017   ID:  Jorge Kelly, DOB 27-Jun-1956, MRN [id number]  PCP:  [name], MD    [facility] Readings from Last 3 Encounters:  08/21/17 207 lb 1.9 oz (93.9 kg)  05/08/17 205 lb (93 kg)  10/23/16 207 lb 12.8 oz (94.3 kg)       History of Present Illness: Jorge Kelly is a 62 y.o. male  with a history of paroxysmal atrial fibrillation. He has lost significant weight over the past couple of years. Within the last 2 years, he was started on flecainide.  Recently, he had a kidney stone. During the kidney stone episode, he felt himself go back and atrial fibrillation. He is here for further evaluation.  He has maintained his flecainide and Eliquis use. He has not missed any doses. Today, he felt a little bit better. Her heart rate seemed to be less but he didn't think he was back in normal rhythm. He is not having any chest discomfort.  07/04/2016 - the patient was seen in September urgently he went back relation. The psoas after he was treated for kidney stones. He was scheduled for cardioversion shortly after that visit but when he presented to the hospital he was back in sinus rhythm. Today he is coming after 2 months, he continues to be very active, he exercises on treadmill twice a day for 20 minutes with no symptoms of chest pain shortness of breath palpitation or syncope. He spends every weekend sailing competitively with his sons. He has lost overall 50 pounds and feels great.  08/21/2017 - this is one year follow-up, the patient remains very active and in great shape, couple years ago he has lost 50 pounds he was able to keep that weight. He has noticed that his dictations are more frequent approximately twice a months and they can last several hours up to one occasion when his palpitations lasted for 20 hours. Also his heart rate was in 110-120 range. He has not associated dizziness, syncope chest pain or shortness of breath. His tolerating  medications well, he has no bleeding or bruising. His primary care physician recently changed his collapse and they're all normal.  Past Medical History:  Diagnosis Date  . Actinic keratosis 10/24/2009  . ALLERGIC RHINITIS 05/11/2007  . ELEVATED BLOOD PRESSURE WITHOUT DIAGNOSIS OF HYPERTENSION 11/12/2007   Dr. Debara Pickett mentioned HTN on problem list  . Esophageal stricture    treated with protonix  . GERD 05/11/2007  . GI bleeding    20 years ago  . Melanoma ([place]) 01/2014   remoted from forehead  . MIGRAINE HEADACHE 05/11/2007  . Overweight   . Persistent atrial fibrillation ([place]) 05/2014  . Snores     Past Surgical History:  Procedure Laterality Date  . ANKLE FRACTURE SURGERY  2010   x2-left  . CARDIOVERSION N/A 06/23/2014  . CARDIOVERSION N/A 09/09/2014  . malignant melanoma     removed from forehead in June 2015  . STRABISMUS SURGERY Right 05/21/2014   Dr. Annamaria Boots  . UPPER GI ENDOSCOPY     stricture, just PPI     Current Outpatient Medications  Medication Sig Dispense Refill  . acetaminophen (TYLENOL) 500 MG tablet Take 500 mg by mouth every 6 (six) hours as needed for mild pain or moderate pain. Reported on 12/12/2015    . apixaban (ELIQUIS) 5 MG TABS tablet Take 1 tablet (5 mg total) by mouth 2 (two) times daily. 60 tablet  11  . diltiazem (CARDIZEM CD) 120 MG 24 hr capsule Take 1 capsule (120 mg total) by mouth daily. 90 capsule 3  . flecainide (TAMBOCOR) 100 MG tablet Take 1 tablet (100 mg total) by mouth 2 (two) times daily. 180 tablet 3  . omeprazole (PRILOSEC) 40 MG capsule Take 1 capsule (40 mg total) by mouth daily. 90 capsule 3  . ondansetron (ZOFRAN) 4 MG tablet Take 1 tablet (4 mg total) by mouth every 6 (six) hours. 12 tablet 0  . oxyCODONE-acetaminophen (PERCOCET/ROXICET) 5-325 MG tablet Take 2 tablets by mouth every 4 (four) hours as needed for severe pain. 15 tablet 0  . rizatriptan (MAXALT-MLT) 10 MG disintegrating tablet DISSOLVE 1 TAB BY MOUTH AS SOON AS HEADACHE  STARTS. MAY REPEAT IN 2 HOURS IF NO RELIEF. 9 tablet 2  . tamsulosin (FLOMAX) 0.4 MG CAPS capsule Take 1 capsule (0.4 mg total) by mouth daily after supper. 7 capsule 0  . diltiazem (CARDIZEM) 60 MG tablet Take 1 tablet (60 mg total) by mouth as needed. For palpitations. 30 tablet 3   No current facility-administered medications for this visit.     Allergies:   Patient has no known allergies.    Social History:  The patient  reports that  has never smoked. he has never used smokeless tobacco. He reports that he does not drink alcohol or use drugs.   Family History:  The patient's family history includes Atrial fibrillation in his mother; Cancer in his sister; Diabetes type II in his sister; Heart disease in his maternal uncle; Leukemia in his father; Lymphoma in his mother.    ROS:  Please see the history of present illness.   Otherwise, review of systems are positive for Fatigue and palpitations with atrial fibrillation.   All other systems are reviewed and negative.    PHYSICAL EXAM: VS:  BP 122/78   Pulse 77   Ht 6\' 2"  (1.88 m)   Wt 207 lb 1.9 oz (93.9 kg)   BMI 26.59 kg/m  , BMI Body mass index is 26.59 kg/m. GEN: Well nourished, well developed, in no acute distress  HEENT: normal  Neck: no JVD, carotid bruits, or masses Cardiac: Irregularly irregular; no murmurs, rubs, or gallops,no edema  Respiratory:  clear to auscultation bilaterally, normal work of breathing GI: soft, nontender, nondistended, + BS MS: no deformity or atrophy  Skin: warm and dry, no rash Neuro:  Strength and sensation are intact Psych: euthymic mood, full affect  EKG:   The ekg ordered today 08/21/17 - demonstrates NSR, normal ECG, personally reviewed.  Recent Labs: No results found for requested labs within last 8760 hours.   Lipid Panel    Component Value Date/Time   CHOL 171 05/22/2014 0330   TRIG 117 05/22/2014 0330   HDL 46 05/22/2014 0330   CHOLHDL 3.7 05/22/2014 0330   VLDL 23  05/22/2014 0330   LDLCALC 102 (H) 05/22/2014 0330   LDLDIRECT 96.1 09/02/2013 0839     Other studies Reviewed: Additional studies/ records that were reviewed today with results demonstrating: Echocardiogram showed ejection fraction of 50-55% in October 2015.    ASSESSMENT AND PLAN:  1. AFib: Paroxysmal. Back In sinus rhythm, it appears that episodes are now appearing more frequently in the last longer, he is still hesitant about ablation, he would normally firearms if his changes his mind or if his episodes become more symptomatic and more frequent. 2. Continue Eliquis.  He has no bleeding. Recent CBC normal. 3. Essential hypertension -  controlled.  Current medicines are reviewed at length with the patient today.  The patient concerns regarding his medicines were addressed.  The following changes have been made:  No change  Labs/ tests ordered today include: pre DCCV labs  Orders Placed This Encounter  Procedures  . EKG 12-Lead   Recommend 150 minutes/week of aerobic exercise Low fat, low carb, high fiber diet recommended  Disposition:   FU in post cardioversion  Signed, Ena Dawley, MD  08/21/2017 12:20 PM    [facility] [place], [place], [place]  [address] Phone: [phone]; Fax: [phone]

## 2017-08-21 NOTE — Patient Instructions (Signed)
Medication Instructions:   TAKE CARDIZEM (PILL IN A POCKET) 60 MG BY MOUTH DAILY ONLY AS NEEDED FOR PALPITATIONS     Follow-Up:  Your physician wants you to follow-up in: [place] will receive a reminder letter in the mail two months in advance. If you don't receive a letter, please call our office to schedule the follow-up appointment.        If you need a refill on your cardiac medications before your next appointment, please call your pharmacy.

## 2017-11-12 ENCOUNTER — Other Ambulatory Visit: Payer: Self-pay

## 2017-11-14 ENCOUNTER — Other Ambulatory Visit: Payer: Self-pay | Admitting: Cardiology

## 2017-11-14 DIAGNOSIS — I10 Essential (primary) hypertension: Secondary | ICD-10-CM

## 2017-11-14 DIAGNOSIS — Z5181 Encounter for therapeutic drug level monitoring: Secondary | ICD-10-CM

## 2017-11-14 DIAGNOSIS — I480 Paroxysmal atrial fibrillation: Secondary | ICD-10-CM

## 2017-11-14 DIAGNOSIS — Z79899 Other long term (current) drug therapy: Principal | ICD-10-CM

## 2018-01-07 ENCOUNTER — Other Ambulatory Visit: Payer: Self-pay

## 2018-01-07 MED ORDER — RIZATRIPTAN BENZOATE 10 MG PO TBDP: ORAL_TABLET | ORAL | 2 refills | Status: DC

## 2018-08-25 ENCOUNTER — Encounter: Payer: Self-pay | Admitting: Cardiology

## 2018-08-25 ENCOUNTER — Ambulatory Visit (INDEPENDENT_AMBULATORY_CARE_PROVIDER_SITE_OTHER): Payer: BLUE CROSS/BLUE SHIELD | Admitting: Cardiology

## 2018-08-25 DIAGNOSIS — I10 Essential (primary) hypertension: Secondary | ICD-10-CM

## 2018-08-25 DIAGNOSIS — I480 Paroxysmal atrial fibrillation: Secondary | ICD-10-CM

## 2018-08-25 DIAGNOSIS — Z5181 Encounter for therapeutic drug level monitoring: Secondary | ICD-10-CM | POA: Diagnosis not present

## 2018-08-25 DIAGNOSIS — Z79899 Other long term (current) drug therapy: Secondary | ICD-10-CM

## 2018-08-25 MED ORDER — FLECAINIDE ACETATE 100 MG PO TABS: 100.0000 mg | ORAL_TABLET | Freq: Two times a day (BID) | ORAL | 3 refills | Status: DC

## 2018-08-25 MED ORDER — APIXABAN 5 MG PO TABS: 5.0000 mg | ORAL_TABLET | Freq: Two times a day (BID) | ORAL | 11 refills | Status: DC

## 2018-08-25 MED ORDER — DILTIAZEM HCL ER COATED BEADS 120 MG PO CP24: 120.0000 mg | ORAL_CAPSULE | Freq: Every day | ORAL | 3 refills | Status: DC

## 2018-08-25 MED ORDER — DILTIAZEM HCL 60 MG PO TABS: 60.0000 mg | ORAL_TABLET | Freq: Every day | ORAL | 8 refills | Status: DC | PRN

## 2018-08-25 NOTE — Patient Instructions (Signed)
Medication Instructions:   Your physician recommends that you continue on your current medications as directed. Please refer to the Current Medication list given to you today.  If you need a refill on your cardiac medications before your next appointment, please call your pharmacy.       Follow-Up: At [facility], you and your health needs are our priority.  As part of our continuing mission to provide you with exceptional heart care, we have created designated Provider Care Teams.  These Care Teams include your primary Cardiologist (physician) and Advanced Practice Providers (APPs -  Physician Assistants and Nurse Practitioners) who all work together to provide you with the care you need, when you need it. You will need a follow up appointment in 12 months.  Please call our office 2 months in advance to schedule this appointment.  You may see Ena Dawley, MD or one of the following Advanced Practice Providers on your designated Care Team:   [place], PA-C Melina Copa, PA-C . Ermalinda Barrios, PA-C

## 2018-08-25 NOTE — Progress Notes (Signed)
Cardiology Office Note   Date:  08/25/2018   ID:  Jorge Kelly, DOB 08/07/1955, MRN [id number]  PCP:  [name], MD    Wt Readings from Last 3 Encounters:  08/25/18 211 lb 12.8 oz (96.1 kg)  08/21/17 207 lb 1.9 oz (93.9 kg)  05/08/17 205 lb (93 kg)       History of Present Illness: Jorge Kelly is a 63 y.o. male  with a history of paroxysmal atrial fibrillation. He has lost significant weight over the past couple of years. Within the last 2 years, he was started on flecainide. He had an episode of atrial fibrillation while he had a kidney stone.  He spontaneously cardioverted back to sinus rhythm.  08/21/2017 - this is one year follow-up, the patient remains very active and in great shape, couple years ago he has lost 50 pounds he was able to keep that weight. He has noticed that his dictations are more frequent approximately twice a months and they can last several hours up to one occasion when his palpitations lasted for 20 hours. Also his heart rate was in 110-120 range. He has not associated dizziness, syncope chest pain or shortness of breath. His tolerating medications well, he has no bleeding or bruising. His primary care physician recently changed his collapse and they're all normal.  08/25/2018 -this is 1 year follow-up, the patient feels great, he gets episodes of atrial fibrillation approximately once a months and only last few hours, on one occasion he had to take as needed Cardizem.  He was tested for sleep apnea and it was negative.  He states that he does snore.  He denies any dizziness or syncope, no bleeding with Eliquis.  Past Medical History:  Diagnosis Date  . Actinic keratosis 10/24/2009  . ALLERGIC RHINITIS 05/11/2007  . ELEVATED BLOOD PRESSURE WITHOUT DIAGNOSIS OF HYPERTENSION 11/12/2007   Dr. Debara Pickett mentioned HTN on problem list  . Esophageal stricture    treated with protonix  . GERD 05/11/2007  . GI bleeding    20 years ago  . Melanoma ([place]) 01/2014   remoted from forehead  . MIGRAINE HEADACHE 05/11/2007  . Overweight   . Persistent atrial fibrillation 05/2014  . Snores     Past Surgical History:  Procedure Laterality Date  . ANKLE FRACTURE SURGERY  2010   x2-left  . CARDIOVERSION N/A 06/23/2014  . CARDIOVERSION N/A 09/09/2014  . malignant melanoma     removed from forehead in June 2015  . STRABISMUS SURGERY Right 05/21/2014   Dr. Annamaria Boots  . UPPER GI ENDOSCOPY     stricture, just PPI   Current Outpatient Medications  Medication Sig Dispense Refill  . acetaminophen (TYLENOL) 500 MG tablet Take 500 mg by mouth every 6 (six) hours as needed for mild pain or moderate pain. Reported on 12/12/2015    . apixaban (ELIQUIS) 5 MG TABS tablet Take 1 tablet (5 mg total) by mouth 2 (two) times daily. 60 tablet 11  . diltiazem (CARDIZEM CD) 120 MG 24 hr capsule Take 1 capsule (120 mg total) by mouth daily. 90 capsule 3  . diltiazem (CARDIZEM) 60 MG tablet Take 1 tablet (60 mg total) by mouth daily as needed. For palpitations. 30 tablet 8  . flecainide (TAMBOCOR) 100 MG tablet Take 1 tablet (100 mg total) by mouth 2 (two) times daily. 180 tablet 3  . omeprazole (PRILOSEC) 40 MG capsule Take 1 capsule (40 mg total) by mouth daily. 90 capsule 3  .  ondansetron (ZOFRAN) 4 MG tablet Take 1 tablet (4 mg total) by mouth every 6 (six) hours. 12 tablet 0  . oxyCODONE-acetaminophen (PERCOCET/ROXICET) 5-325 MG tablet Take 2 tablets by mouth every 4 (four) hours as needed for severe pain. 15 tablet 0  . rizatriptan (MAXALT-MLT) 10 MG disintegrating tablet DISSOLVE 1 TAB BY MOUTH AS SOON AS HEADACHE STARTS. MAY REPEAT IN 2 HOURS IF NO RELIEF. 9 tablet 2  . tamsulosin (FLOMAX) 0.4 MG CAPS capsule Take 1 capsule (0.4 mg total) by mouth daily after supper. 7 capsule 0   No current facility-administered medications for this visit.     Allergies:   Patient has no known allergies.    Social History:  The patient  reports that he has never smoked. He has never used  smokeless tobacco. He reports that he does not drink alcohol or use drugs.   Family History:  The patient's family history includes Atrial fibrillation in his mother; Cancer in his sister; Diabetes type II in his sister; Heart disease in his maternal uncle; Leukemia in his father; Lymphoma in his mother.    ROS:  Please see the history of present illness.   Otherwise, review of systems are positive for Fatigue and palpitations with atrial fibrillation.   All other systems are reviewed and negative.    PHYSICAL EXAM: VS:  BP 122/82   Pulse 71   Ht 6\' 2"  (1.88 m)   Wt 211 lb 12.8 oz (96.1 kg)   SpO2 96%   BMI 27.19 kg/m  , BMI Body mass index is 27.19 kg/m. GEN: Well nourished, well developed, in no acute distress  HEENT: normal  Neck: no JVD, carotid bruits, or masses Cardiac: Irregularly irregular; no murmurs, rubs, or gallops,no edema  Respiratory:  clear to auscultation bilaterally, normal work of breathing GI: soft, nontender, nondistended, + BS MS: no deformity or atrophy  Skin: warm and dry, no rash Neuro:  Strength and sensation are intact Psych: euthymic mood, full affect  EKG:   The ekg ordered today 08/21/17 - demonstrates NSR, normal ECG, personally reviewed.  Recent Labs: No results found for requested labs within last 8760 hours.   Lipid Panel    Component Value Date/Time   CHOL 171 05/22/2014 0330   TRIG 117 05/22/2014 0330   HDL 46 05/22/2014 0330   CHOLHDL 3.7 05/22/2014 0330   VLDL 23 05/22/2014 0330   LDLCALC 102 (H) 05/22/2014 0330   LDLDIRECT 96.1 09/02/2013 0839     Other studies Reviewed: Additional studies/ records that were reviewed today with results demonstrating: Echocardiogram showed ejection fraction of 50-55% in October 2015.    ASSESSMENT AND PLAN:  1. AFib: Paroxysmal. In sinus rhythm, his A. fib is stable he gets approximately one episode a month and only last few hours.  He was tested for sleep apnea but it was negative.  He was  advised to continue exercising.   2. Continue Eliquis.  He has no bleeding. Recent CBC normal. 3. Essential hypertension - controlled.  We will obtain labs from his primary care physician.  Current medicines are reviewed at length with the patient today.  The patient concerns regarding his medicines were addressed.  The following changes have been made:  No change  Labs/ tests ordered today include: pre DCCV labs  Orders Placed This Encounter  Procedures  . EKG 12-Lead   Recommend 150 minutes/week of aerobic exercise Low fat, low carb, high fiber diet recommended  Disposition:   FU in post cardioversion  Signed, Ena Dawley, MD  08/25/2018 4:08 PM    [facility] [place], [place], [place]  [address] Phone: [phone]; Fax: [phone]

## 2019-06-21 ENCOUNTER — Other Ambulatory Visit: Payer: Self-pay | Admitting: Family Medicine

## 2019-06-25 ENCOUNTER — Other Ambulatory Visit: Payer: Self-pay

## 2019-06-25 DIAGNOSIS — Z20822 Contact with and (suspected) exposure to covid-19: Secondary | ICD-10-CM

## 2019-06-27 ENCOUNTER — Other Ambulatory Visit: Payer: Self-pay | Admitting: Family Medicine

## 2019-06-27 LAB — NOVEL CORONAVIRUS, NAA: SARS-CoV-2, NAA: NOT DETECTED

## 2019-07-13 DIAGNOSIS — M7121 Synovial cyst of popliteal space [Baker], right knee: Secondary | ICD-10-CM | POA: Diagnosis not present

## 2019-07-13 DIAGNOSIS — L304 Erythema intertrigo: Secondary | ICD-10-CM | POA: Diagnosis not present

## 2019-07-13 DIAGNOSIS — D225 Melanocytic nevi of trunk: Secondary | ICD-10-CM | POA: Diagnosis not present

## 2019-07-13 DIAGNOSIS — Z85820 Personal history of malignant melanoma of skin: Secondary | ICD-10-CM | POA: Diagnosis not present

## 2019-08-20 ENCOUNTER — Encounter: Payer: Self-pay | Admitting: Family Medicine

## 2019-08-20 ENCOUNTER — Other Ambulatory Visit: Payer: Self-pay

## 2019-08-20 ENCOUNTER — Ambulatory Visit (INDEPENDENT_AMBULATORY_CARE_PROVIDER_SITE_OTHER): Payer: BC Managed Care – PPO | Admitting: Family Medicine

## 2019-08-20 VITALS — BP 118/78 | HR 78 | Temp 98.4°F | Ht 74.0 in | Wt 221.8 lb

## 2019-08-20 DIAGNOSIS — Z85820 Personal history of malignant melanoma of skin: Secondary | ICD-10-CM

## 2019-08-20 DIAGNOSIS — E785 Hyperlipidemia, unspecified: Secondary | ICD-10-CM

## 2019-08-20 DIAGNOSIS — Z125 Encounter for screening for malignant neoplasm of prostate: Secondary | ICD-10-CM | POA: Diagnosis not present

## 2019-08-20 DIAGNOSIS — G43109 Migraine with aura, not intractable, without status migrainosus: Secondary | ICD-10-CM | POA: Diagnosis not present

## 2019-08-20 DIAGNOSIS — Z0000 Encounter for general adult medical examination without abnormal findings: Secondary | ICD-10-CM

## 2019-08-20 DIAGNOSIS — I480 Paroxysmal atrial fibrillation: Secondary | ICD-10-CM | POA: Diagnosis not present

## 2019-08-20 DIAGNOSIS — K4090 Unilateral inguinal hernia, without obstruction or gangrene, not specified as recurrent: Secondary | ICD-10-CM

## 2019-08-20 DIAGNOSIS — Z1159 Encounter for screening for other viral diseases: Secondary | ICD-10-CM

## 2019-08-20 DIAGNOSIS — Z23 Encounter for immunization: Secondary | ICD-10-CM

## 2019-08-20 DIAGNOSIS — Z1211 Encounter for screening for malignant neoplasm of colon: Secondary | ICD-10-CM

## 2019-08-20 MED ORDER — RIZATRIPTAN BENZOATE 10 MG PO TBDP: ORAL_TABLET | ORAL | 2 refills | Status: DC

## 2019-08-20 NOTE — Patient Instructions (Addendum)
Health Maintenance Due  Topic Date Due  . Hepatitis C Screening - today [birth year]  . COLONOSCOPY - opted for cologuard 11/11/2017   - Shingrix #1 today. Repeat injection in 2-5 months. Schedule a nurse visit for the 2nd injection before you leave today (at the check out desk)  We will call you within two weeks about your referral to Dr. Rosendo Gros of [facility] [place] [facility]. If you do not hear within 3 weeks, give [place] a call.    Please stop by lab before you go If you do not have mychart- we will call you about results within 5 business days of [place] receiving them.  If you have mychart- we will send your results within 3 business days of [place] receiving them.  If abnormal or we want to clarify a result, we will call or mychart you to make sure you receive the message.  If you have questions or concerns or don't hear within 5-7 days, please send [place] a message or call us.     Recommended follow up: Return in about 1 year (around 08/19/2020) for physical or sooner if needed.

## 2019-08-20 NOTE — Addendum Note (Signed)
Addended by: Francella Solian on: 08/20/2019 04:09 PM   Modules accepted: Orders

## 2019-08-20 NOTE — Progress Notes (Signed)
Phone: [phone]   Subjective:  Patient presents today for their annual physical. Chief complaint-noted.   See problem oriented charting- Review of Systems  Constitutional: Negative.   HENT: Negative.   Eyes: Negative.   Respiratory: Negative.   Cardiovascular: Negative.   Gastrointestinal: Negative.   Genitourinary: Negative.   Musculoskeletal: Negative.   Skin: Negative.   Neurological: Positive for headaches.       Hx of migraines (patient states 3 a year).  Endo/Heme/Allergies: Negative.   Psychiatric/Behavioral: Negative.    The following were reviewed and entered/updated in epic: Past Medical History:  Diagnosis Date  . Actinic keratosis 10/24/2009  . ALLERGIC RHINITIS 05/11/2007  . ELEVATED BLOOD PRESSURE WITHOUT DIAGNOSIS OF HYPERTENSION 11/12/2007   Dr. Debara Pickett mentioned HTN on problem list  . Esophageal stricture    treated with protonix  . GERD 05/11/2007  . GI bleeding    20 years ago  . Melanoma ([place]) 01/2014   remoted from forehead  . MIGRAINE HEADACHE 05/11/2007  . Overweight   . Persistent atrial fibrillation ([place]) 05/2014  . Snores    Patient Active Problem List   Diagnosis Date Noted  . PAF (paroxysmal atrial fibrillation) ([place]) 05/21/2014    Priority: High  . Hyperlipidemia, unspecified 08/20/2019    Priority: Medium  . Inguinal hernia 12/10/2014    Priority: Medium  . History of melanoma 01/04/2014    Priority: Medium  . Migraine 05/11/2007    Priority: Medium  . Snoring 08/25/2014    Priority: Low  . Overweight 08/25/2014    Priority: Low  . 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 Actinic keratosis 10/24/2009    Priority: Low  . GERD 05/11/2007    Priority: Low   Past Surgical History:  Procedure Laterality Date  . ANKLE FRACTURE SURGERY  2010   x2-left  . CARDIOVERSION N/A 06/23/2014  . CARDIOVERSION N/A 09/09/2014  . malignant melanoma     removed from forehead in June 2015  . STRABISMUS SURGERY Right 05/21/2014   Dr. Annamaria Boots  . UPPER GI ENDOSCOPY     stricture, just PPI    Family History  Problem Relation Age of Onset  . Lymphoma Mother   . Atrial fibrillation Mother        pacemaker in 42s  . Leukemia Father        passed from CLL  . Cancer Sister        treated, cancer free  . Diabetes type II Sister   . Heart disease Maternal Uncle        mother lost 6 brothers to heart disease in 73s    Medications- reviewed and updated Current Outpatient Medications  Medication Sig Dispense Refill  . apixaban (ELIQUIS) 5 MG TABS tablet Take 1 tablet (5 mg total) by mouth 2 (two) times daily. 60 tablet 11  . diltiazem (CARDIZEM CD) 120 MG 24 hr capsule Take 1 capsule (120 mg total) by mouth daily. 90 capsule 3  . flecainide (TAMBOCOR) 100 MG tablet Take 1 tablet (100 mg total) by mouth 2 (two) times daily. 180 tablet 3  . rizatriptan (MAXALT-MLT) 10 MG disintegrating tablet Take daily as needed for migraine May repeat in 2 hours if needed x1 9 tablet 2  . diltiazem (CARDIZEM) 60 MG tablet Take 1 tablet (60 mg total) by mouth daily as needed. For palpitations. (Patient not taking: Reported on 08/20/2019) 30 tablet 8   No current facility-administered medications for this visit.    Allergies-reviewed  and updated No Known Allergies  Social History   Social History Narrative   Lives in [place] with wife (wife patient at [facility]).  2 sons.- 47 Logan at [employer] and Marshall at [employer] in [place]  Wife with pregnancy at 65 and 39.       Trial lawyer. Criminal defense      Hobbies: sails usually in [place]    Objective  Objective:  BP 118/78   Pulse 78   Temp 98.4 F (36.9 C) (Temporal)   Ht 6\' 2"  (1.88 m)   Wt 221 lb 12.8 oz (100.6 kg)   SpO2 95%   BMI 28.48 kg/m  Gen: NAD, resting comfortably HEENT: Mask not removed due to covid 19. TM normal. Bridge of nose normal. Eyelids normal.  Neck: no thyromegaly or cervical lymphadenopathy  CV: RRR no murmurs rubs or gallops Lungs:  CTAB no crackles, wheeze, rhonchi Abdomen: soft/nontender/nondistended/normal bowel sounds. No rebound or guarding.  Ext: no edema Skin: warm, dry Neuro: grossly normal, moves all extremities, PERRLA    Assessment and Plan  64 y.o. male presenting for annual physical.  Health Maintenance counseling: 1. Anticipatory guidance: Patient counseled regarding regular dental exams q6 months, eye exams yearly,  avoiding smoking and second hand smoke, limiting alcohol.   2. Risk factor reduction:  Advised patient of need for regular exercise and diet rich and fruits and vegetables to reduce risk of heart attack and stroke. Exercise-Patient is active on the treadmill/rowing machine every other day.   Diet-He says that he has been really careful about his diet.  Appears weight has crept up about 10 pounds in the last year- prior substantial weight loss but some increase with covid plus this time of year after holidays and birthdays  Wt Readings from Last 3 Encounters:  08/20/19 221 lb 12.8 oz (100.6 kg)  08/25/18 211 lb 12.8 oz (96.1 kg)  08/21/17 207 lb 1.9 oz (93.9 kg)  3. Immunizations/screenings/ancillary studies-discussed Shingrix and opts in.  Recommended Covid vaccination when available for him Immunization History  Administered Date(s) Administered  . Influenza Split 04/25/2011  . Influenza Whole 07/11/2007, 05/21/2008  . Influenza,inj,Quad PF,6+ Mos 06/06/2014, 10/23/2016, 05/08/2017  . Tdap 11/30/2013  . Zoster 04/25/2011   4. Prostate cancer screening- update PSA with labs.  Hopefully low risk trend  Dribbling, urgency, nocturia gradually worsening over last 4 years but largely steady. Back and forth for medicine with this.  Lab Results  Component Value Date   PSA 2.16 11/20/2013   5. Colon cancer screening - last completed in 2009-due for repeat. No family history and no history of polyps- prefers cologuard so doesn't have to stop eliquis 6. Skin cancer screening- History of  melanoma-follows with dermatology regularly.  Advised regular sunscreen use. Denies worrisome, changing, or new skin lesions. He sees his [facility], twice a year. 7. Patient is not a smoker.   Status of chronic or acute concerns   Inguinal hernia on the left- refer back to Dr. Rosendo Gros- has worsened and he would like to move forward with fixing this. He will get clearance from CV perspective from cardiology and we can clear medically- no concerns on medical front at this time.   PAF (paroxysmal atrial fibrillation) (HCC)-follows with cardiology.  Has been in sinus rhythm per last cardiology note but that was about a year ago January 2020.  Compliant with flecainide to help maintain sinus rhythm.  Also on diltiazem 120 mg extended release for rate control as  shorter acting diltiazem for palpitations compliant with Eliquis for anticoagulation. Feels better if eats well and walks and takes diltiazem if goes into a fib.    Migraine with aura and without status migrainosus, not intractable-doing well lately with rizatriptan as needed only needs 3-4x a year if gets off his diet.    Hyperlipidemia, unspecified hyperlipidemia type-mild hyperlipidemia, update lipid panel - prefers lifestyle change over medicine  Recommended follow up: 1 year physical or sooner if needed  Lab/Order associations: fasting   ICD-10-CM   1. Preventative health care  Z00.00 CBC with Differential/Platelet    Comprehensive metabolic panel    Lipid panel    PSA    Hepatitis C antibody    Surgery    Cologuard  2. PAF (paroxysmal atrial fibrillation) (HCC)  I48.0   3. Migraine with aura and without status migrainosus, not intractable  G43.109   4. History of melanoma  Z85.820   5. Hyperlipidemia, unspecified hyperlipidemia type  E78.5 CBC with Differential/Platelet    Comprehensive metabolic panel    Lipid panel  6. Screening for prostate cancer  Z12.5 PSA  7. Encounter for hepatitis C screening  test for low risk patient  Z11.59 Hepatitis C antibody  8. Left inguinal hernia  K40.90 Surgery  9. Screen for colon cancer  Z12.11 Cologuard    Meds ordered this encounter  Medications  . rizatriptan (MAXALT-MLT) 10 MG disintegrating tablet    Sig: Take daily as needed for migraine May repeat in 2 hours if needed x1    Dispense:  9 tablet    Refill:  2   Return precautions advised.  Garret Reddish, MD

## 2019-08-21 LAB — CBC WITH DIFFERENTIAL/PLATELET
Basophils Absolute: 0 10*3/uL (ref 0.0–0.1)
Basophils Relative: 0.5 % (ref 0.0–3.0)
Eosinophils Absolute: 0.2 10*3/uL (ref 0.0–0.7)
Eosinophils Relative: 3.5 % (ref 0.0–5.0)
HCT: 46.8 % (ref 39.0–52.0)
Hemoglobin: 15.9 g/dL (ref 13.0–17.0)
Lymphocytes Relative: 26.8 % (ref 12.0–46.0)
Lymphs Abs: 1.6 10*3/uL (ref 0.7–4.0)
MCHC: 34 g/dL (ref 30.0–36.0)
MCV: 92.8 fl (ref 78.0–100.0)
Monocytes Absolute: 0.4 10*3/uL (ref 0.1–1.0)
Monocytes Relative: 6.2 % (ref 3.0–12.0)
Neutro Abs: 3.7 10*3/uL (ref 1.4–7.7)
Neutrophils Relative %: 63 % (ref 43.0–77.0)
Platelets: 148 10*3/uL — ABNORMAL LOW (ref 150.0–400.0)
RBC: 5.05 Mil/uL (ref 4.22–5.81)
RDW: 12.9 % (ref 11.5–15.5)
WBC: 5.9 10*3/uL (ref 4.0–10.5)

## 2019-08-21 LAB — HEPATITIS C ANTIBODY
Hepatitis C Ab: NONREACTIVE
SIGNAL TO CUT-OFF: 0.01 (ref <1.00)

## 2019-08-21 LAB — LIPID PANEL
Cholesterol: 191 mg/dL (ref 0–200)
HDL: 49.3 mg/dL (ref >39.00)
LDL Cholesterol: 125 mg/dL — ABNORMAL HIGH (ref 0–99)
NonHDL: 141.52
Total CHOL/HDL Ratio: 4
Triglycerides: 81 mg/dL (ref 0.0–149.0)
VLDL: 16.2 mg/dL (ref 0.0–40.0)

## 2019-08-21 LAB — COMPREHENSIVE METABOLIC PANEL
ALT: 10 U/L (ref 0–53)
AST: 12 U/L (ref 0–37)
Albumin: 4.5 g/dL (ref 3.5–5.2)
Alkaline Phosphatase: 66 U/L (ref 39–117)
BUN: 22 mg/dL (ref 6–23)
CO2: 25 mEq/L (ref 19–32)
Calcium: 9.3 mg/dL (ref 8.4–10.5)
Chloride: 105 mEq/L (ref 96–112)
Creatinine, Ser: 0.87 mg/dL (ref 0.40–1.50)
GFR: 88.86 mL/min (ref >60.00)
Glucose, Bld: 90 mg/dL (ref 70–99)
Potassium: 4.2 mEq/L (ref 3.5–5.1)
Sodium: 139 mEq/L (ref 135–145)
Total Bilirubin: 1.1 mg/dL (ref 0.2–1.2)
Total Protein: 6.5 g/dL (ref 6.0–8.3)

## 2019-08-21 LAB — PSA: PSA: 2.8 ng/mL (ref 0.10–4.00)

## 2019-09-11 ENCOUNTER — Ambulatory Visit
Admission: RE | Admit: 2019-09-11 | Discharge: 2019-09-11 | Disposition: A | Discharge status: Home / Self Care | Payer: Self-pay | Source: Ambulatory Visit | Attending: Cardiology | Admitting: Cardiology

## 2019-09-11 ENCOUNTER — Ambulatory Visit (INDEPENDENT_AMBULATORY_CARE_PROVIDER_SITE_OTHER): Payer: BC Managed Care – PPO | Admitting: Cardiology

## 2019-09-11 ENCOUNTER — Other Ambulatory Visit: Payer: Self-pay

## 2019-09-11 ENCOUNTER — Encounter: Payer: Self-pay | Admitting: Cardiology

## 2019-09-11 ENCOUNTER — Telehealth: Payer: Self-pay | Admitting: *Deleted

## 2019-09-11 VITALS — BP 116/76 | HR 73 | Ht 74.0 in | Wt 212.8 lb

## 2019-09-11 DIAGNOSIS — Z8249 Family history of ischemic heart disease and other diseases of the circulatory system: Secondary | ICD-10-CM

## 2019-09-11 DIAGNOSIS — E785 Hyperlipidemia, unspecified: Secondary | ICD-10-CM

## 2019-09-11 DIAGNOSIS — I10 Essential (primary) hypertension: Secondary | ICD-10-CM

## 2019-09-11 DIAGNOSIS — I480 Paroxysmal atrial fibrillation: Secondary | ICD-10-CM

## 2019-09-11 DIAGNOSIS — I2510 Atherosclerotic heart disease of native coronary artery without angina pectoris: Secondary | ICD-10-CM

## 2019-09-11 DIAGNOSIS — I2584 Coronary atherosclerosis due to calcified coronary lesion: Secondary | ICD-10-CM

## 2019-09-11 DIAGNOSIS — Z7901 Long term (current) use of anticoagulants: Secondary | ICD-10-CM

## 2019-09-11 MED ORDER — ROSUVASTATIN CALCIUM 20 MG PO TABS: 20.0000 mg | ORAL_TABLET | Freq: Every day | ORAL | 3 refills | Status: DC

## 2019-09-11 NOTE — Telephone Encounter (Signed)
-----   Message from Dorothy Spark, MD sent at 09/11/2019  5:14 PM EST ----- We will start using rosuvastatin 20 mg daily and follow with lipids and LFTs in 4 weeks. Thank you

## 2019-09-11 NOTE — Telephone Encounter (Signed)
Dr. [name] notified the pt of his cardiac calcium score and recommendations to start rosuvastatin 20 mg po daily, and we will recheck his lipids and LFTs in 4 weeks.  Sent the new prescription for rosuvastatin to the pts confirmed pharmacy. Scheduled him to come back in in 4 weeks on 10/09/19 at [facility].  Pt is aware to come fasting to this lab appt.  Pt verbalized understanding and agrees with this plan.

## 2019-09-11 NOTE — Progress Notes (Signed)
Cardiology Office Note   Date:  09/11/2019   ID:  Jorge Kelly, DOB [birth year], MRN [id number]  PCP:  [name], MD   Wt Readings from Last 3 Encounters:  09/11/19 212 lb 12.8 oz (96.5 kg)  08/20/19 221 lb 12.8 oz (100.6 kg)  08/25/18 211 lb 12.8 oz (96.1 kg)    History of Present Illness: Jorge Kelly is a 64 y.o. male  with a history of paroxysmal atrial fibrillation. He has lost significant weight over the past couple of years. Within the last 2 years, he was started on flecainide. He had an episode of atrial fibrillation while he had a kidney stone.  He spontaneously cardioverted back to sinus rhythm.  The patient is coming after 1 year, he has been doing great, he is able to exercise and denies any symptoms of chest pain shortness of breath dizziness or syncope.  No lower extremity edema.  He has been compliant with his medications, he has no bleeding with Eliquis.  He states that he has significant family history of premature coronary artery disease on his mother side, in fact 86 of his uncles died of myocardial infarction in their late 60s.  His last LDL was 125.  He gets episodes of A. fib once every 2 months lasting for several hours then he spontaneously cardioverted back to sinus rhythm.  Past Medical History:  Diagnosis Date  . Actinic keratosis 10/24/2009  . ALLERGIC RHINITIS 05/11/2007  . ELEVATED BLOOD PRESSURE WITHOUT DIAGNOSIS OF HYPERTENSION 11/12/2007   Dr. Debara Pickett mentioned HTN on problem list  . Esophageal stricture    treated with protonix  . GERD 05/11/2007  . GI bleeding    20 years ago  . Melanoma ([place]) 01/2014   remoted from forehead  . MIGRAINE HEADACHE 05/11/2007  . Overweight   . Persistent atrial fibrillation ([place]) 05/2014  . Snores     Past Surgical History:  Procedure Laterality Date  . ANKLE FRACTURE SURGERY  2010   x2-left  . CARDIOVERSION N/A 06/23/2014  . CARDIOVERSION N/A 09/09/2014  . malignant melanoma     removed from forehead in June  2015  . STRABISMUS SURGERY Right 05/21/2014   Dr. Annamaria Boots  . UPPER GI ENDOSCOPY     stricture, just PPI   Current Outpatient Medications  Medication Sig Dispense Refill  . apixaban (ELIQUIS) 5 MG TABS tablet Take 1 tablet (5 mg total) by mouth 2 (two) times daily. 60 tablet 11  . diltiazem (CARDIZEM CD) 120 MG 24 hr capsule Take 1 capsule (120 mg total) by mouth daily. 90 capsule 3  . diltiazem (CARDIZEM) 60 MG tablet Take 1 tablet (60 mg total) by mouth daily as needed. For palpitations. 30 tablet 8  . flecainide (TAMBOCOR) 100 MG tablet Take 1 tablet (100 mg total) by mouth 2 (two) times daily. 180 tablet 3  . rizatriptan (MAXALT-MLT) 10 MG disintegrating tablet Take daily as needed for migraine May repeat in 2 hours if needed x1 9 tablet 2   No current facility-administered medications for this visit.    Allergies:   Patient has no known allergies.    Social History:  The patient  reports that he has never smoked. He has never used smokeless tobacco. He reports that he does not drink alcohol or use drugs.   Family History:  The patient's family history includes Atrial fibrillation in his mother; Cancer in his sister; Diabetes type II in his sister; Heart disease in his maternal  uncle; Leukemia in his father; Lymphoma in his mother.    ROS:  Please see the history of present illness.   Otherwise, review of systems are positive for Fatigue and palpitations with atrial fibrillation.   All other systems are reviewed and negative.    PHYSICAL EXAM: VS:  BP 116/76   Pulse 73   Ht 6\' 2"  (1.88 m)   Wt 212 lb 12.8 oz (96.5 kg)   SpO2 98%   BMI 27.32 kg/m  , BMI Body mass index is 27.32 kg/m. GEN: Well nourished, well developed, in no acute distress  HEENT: normal  Neck: no JVD, carotid bruits, or masses Cardiac: Irregularly irregular; no murmurs, rubs, or gallops,no edema  Respiratory:  clear to auscultation bilaterally, normal work of breathing GI: soft, nontender, nondistended, +  BS MS: no deformity or atrophy  Skin: warm and dry, no rash Neuro:  Strength and sensation are intact Psych: euthymic mood, full affect  EKG:   The ekg ordered today 09/11/2019- demonstrates NSR, normal ECG, personally reviewed.  Unchanged from prior.  Recent Labs: 08/20/2019: ALT 10; BUN 22; Creatinine, Ser 0.87; Hemoglobin 15.9; Platelets 148.0; Potassium 4.2; Sodium 139   Lipid Panel    Component Value Date/Time   CHOL 191 08/20/2019 1540   TRIG 81.0 08/20/2019 1540   HDL 49.30 08/20/2019 1540   CHOLHDL 4 08/20/2019 1540   VLDL 16.2 08/20/2019 1540   LDLCALC 125 (H) 08/20/2019 1540   LDLDIRECT 96.1 09/02/2013 0839     Other studies Reviewed: Additional studies/ records that were reviewed today with results demonstrating: Echocardiogram showed ejection fraction of 50-55% in October 2015.    ASSESSMENT AND PLAN:  1. AFib: Paroxysmal. In sinus rhythm, his A. fib is stable he gets approximately one episode every other month, and only last few hours.  We will continue same regimen with flecainide and diltiazem and anticoagulation with Eliquis.  He has no bleeding.  His most recent hemoglobin was 15.9. 2. Chronic anticoagulation with Eliquis -as above. 3. Essential hypertension - controlled. 4. Significant family history with premature coronary artery disease and hyperlipidemia with LDL of 125, we performed calcium scoring in the clinic today and it was 57 that represents 95th percentile for his age and sex.  We will strongly recommend to start using rosuvastatin 20 mg daily and follow with lipids and LFTs in 4 weeks.  We will obtain labs from his primary care physician.  Current medicines are reviewed at length with the patient today.  The patient concerns regarding his medicines were addressed.  The following changes have been made:  No change  Labs/ tests ordered today include: pre DCCV labs  Orders Placed This Encounter  Procedures  . CT CARDIAC SCORING  . EKG 12-Lead    Recommend 150 minutes/week of aerobic exercise Low fat, low carb, high fiber diet recommended  Disposition:   FU in post cardioversion  Signed, Ena Dawley, MD  09/11/2019 1:05 PM    [facility] [place], [place], [place]  [address] Phone: [phone]; Fax: [phone]

## 2019-09-11 NOTE — Patient Instructions (Signed)
Medication Instructions:   Your physician recommends that you continue on your current medications as directed. Please refer to the Current Medication list given to you today.  *If you need a refill on your cardiac medications before your next appointment, please call your pharmacy*    Testing/Procedures:  CALCIUM SCORE TO BE DONE TODAY HERE IN THE OFFICE PER DR. [place] PER [facility] DEPT.   Follow-Up: At [facility], you and your health needs are our priority.  As part of our continuing mission to provide you with exceptional heart care, we have created designated Provider Care Teams.  These Care Teams include your primary Cardiologist (physician) and Advanced Practice Providers (APPs -  Physician Assistants and Nurse Practitioners) who all work together to provide you with the care you need, when you need it.  Your next appointment:   12 month(s)  The format for your next appointment:   In Person  Provider:   Ena Dawley, MD

## 2019-09-13 ENCOUNTER — Other Ambulatory Visit: Payer: Self-pay | Admitting: Cardiology

## 2019-09-13 DIAGNOSIS — Z5181 Encounter for therapeutic drug level monitoring: Secondary | ICD-10-CM

## 2019-09-13 DIAGNOSIS — I480 Paroxysmal atrial fibrillation: Secondary | ICD-10-CM

## 2019-09-13 DIAGNOSIS — I10 Essential (primary) hypertension: Secondary | ICD-10-CM

## 2019-09-13 DIAGNOSIS — Z79899 Other long term (current) drug therapy: Secondary | ICD-10-CM

## 2019-09-14 NOTE — Telephone Encounter (Signed)
Prescription refill request for Eliquis received.  Last office visit: Jorge Kelly 09/11/2019 Scr: 0.87, 08/20/2019 Age: 64 yo Weight: 96.5 kg   Prescription refill sent.

## 2019-09-15 ENCOUNTER — Other Ambulatory Visit: Payer: Self-pay | Admitting: Cardiology

## 2019-09-15 DIAGNOSIS — I480 Paroxysmal atrial fibrillation: Secondary | ICD-10-CM

## 2019-09-15 DIAGNOSIS — I10 Essential (primary) hypertension: Secondary | ICD-10-CM

## 2019-09-15 DIAGNOSIS — Z5181 Encounter for therapeutic drug level monitoring: Secondary | ICD-10-CM

## 2019-09-15 DIAGNOSIS — Z79899 Other long term (current) drug therapy: Secondary | ICD-10-CM

## 2019-10-09 ENCOUNTER — Other Ambulatory Visit: Payer: BC Managed Care – PPO

## 2019-10-15 DIAGNOSIS — Z23 Encounter for immunization: Secondary | ICD-10-CM | POA: Diagnosis not present

## 2019-10-16 ENCOUNTER — Other Ambulatory Visit: Payer: BC Managed Care – PPO | Admitting: *Deleted

## 2019-10-16 ENCOUNTER — Other Ambulatory Visit: Payer: Self-pay

## 2019-10-16 DIAGNOSIS — I2584 Coronary atherosclerosis due to calcified coronary lesion: Secondary | ICD-10-CM

## 2019-10-16 DIAGNOSIS — E785 Hyperlipidemia, unspecified: Secondary | ICD-10-CM | POA: Diagnosis not present

## 2019-10-16 DIAGNOSIS — I2510 Atherosclerotic heart disease of native coronary artery without angina pectoris: Secondary | ICD-10-CM | POA: Diagnosis not present

## 2019-10-16 DIAGNOSIS — Z8249 Family history of ischemic heart disease and other diseases of the circulatory system: Secondary | ICD-10-CM | POA: Diagnosis not present

## 2019-10-16 LAB — HEPATIC FUNCTION PANEL
ALT: 9 IU/L (ref 0–44)
AST: 14 IU/L (ref 0–40)
Albumin: 4.5 g/dL (ref 3.8–4.8)
Alkaline Phosphatase: 63 IU/L (ref 39–117)
Bilirubin Total: 1 mg/dL (ref 0.0–1.2)
Bilirubin, Direct: 0.32 mg/dL (ref 0.00–0.40)
Total Protein: 6 g/dL (ref 6.0–8.5)

## 2019-10-16 LAB — LIPID PANEL
Chol/HDL Ratio: 2.2 ratio (ref 0.0–5.0)
Cholesterol, Total: 103 mg/dL (ref 100–199)
HDL: 46 mg/dL (ref >39)
LDL Chol Calc (NIH): 43 mg/dL (ref 0–99)
Triglycerides: 65 mg/dL (ref 0–149)
VLDL Cholesterol Cal: 14 mg/dL (ref 5–40)

## 2019-10-23 NOTE — Progress Notes (Signed)
Called and lm on pt vm tcb. 

## 2019-11-18 ENCOUNTER — Ambulatory Visit: Payer: BC Managed Care – PPO | Attending: Internal Medicine

## 2019-11-18 DIAGNOSIS — Z23 Encounter for immunization: Secondary | ICD-10-CM

## 2019-11-18 NOTE — Progress Notes (Signed)
   Covid-19 Vaccination Clinic  Name:  Jorge Kelly    MRN: [id number] DOB: 11-02-1955  11/18/2019  [name] was observed post Covid-19 immunization for 15 minutes without incident. He was provided with Vaccine Information Sheet and instruction to access the V-Safe system.   [name] was instructed to call 911 with any severe reactions post vaccine: Marland Kitchen Difficulty breathing  . Swelling of face and throat  . A fast heartbeat  . A bad rash all over body  . Dizziness and weakness   Immunizations Administered    Name Date Dose VIS Date Route   Moderna COVID-19 Vaccine 11/18/2019  9:31 AM 0.5 mL 07/07/2019 Intramuscular   Manufacturer: Moderna   Lot: [id number]   [place]:[id number]

## 2019-11-19 ENCOUNTER — Ambulatory Visit: Payer: BC Managed Care – PPO

## 2019-12-02 ENCOUNTER — Other Ambulatory Visit: Payer: Self-pay

## 2019-12-02 ENCOUNTER — Encounter: Payer: Self-pay | Admitting: Family Medicine

## 2019-12-02 ENCOUNTER — Ambulatory Visit (INDEPENDENT_AMBULATORY_CARE_PROVIDER_SITE_OTHER): Payer: BC Managed Care – PPO | Admitting: Family Medicine

## 2019-12-02 VITALS — BP 108/74 | HR 77 | Temp 98.3°F | Ht 74.0 in | Wt 224.0 lb

## 2019-12-02 DIAGNOSIS — I480 Paroxysmal atrial fibrillation: Secondary | ICD-10-CM | POA: Diagnosis not present

## 2019-12-02 DIAGNOSIS — G43109 Migraine with aura, not intractable, without status migrainosus: Secondary | ICD-10-CM

## 2019-12-02 DIAGNOSIS — M7021 Olecranon bursitis, right elbow: Secondary | ICD-10-CM

## 2019-12-02 DIAGNOSIS — Z85820 Personal history of malignant melanoma of skin: Secondary | ICD-10-CM

## 2019-12-02 DIAGNOSIS — D696 Thrombocytopenia, unspecified: Secondary | ICD-10-CM

## 2019-12-02 DIAGNOSIS — E785 Hyperlipidemia, unspecified: Secondary | ICD-10-CM | POA: Diagnosis not present

## 2019-12-02 NOTE — Progress Notes (Signed)
Phone [phone] In person visit   Subjective:   Jorge Kelly is a 64 y.o. year old very pleasant male patient who presents for/with See problem oriented charting Chief Complaint  Patient presents with  . Elbow Injury   This visit occurred during the SARS-CoV-2 public health emergency.  Safety protocols were in place, including screening questions prior to the visit, additional usage of staff PPE, and extensive cleaning of exam room while observing appropriate contact time as indicated for disinfecting solutions.   Past Medical History-  Patient Active Problem List   Diagnosis Date Noted  . PAF (paroxysmal atrial fibrillation) ([place]) 05/21/2014    Priority: High  . Hyperlipidemia, unspecified 08/20/2019    Priority: Medium  . History of melanoma 01/04/2014    Priority: Medium  . Migraine 05/11/2007    Priority: Medium  . Thrombocytopenia ([place]) 12/02/2019    Priority: Low  . Inguinal hernia 12/10/2014    Priority: Low  . Snoring 08/25/2014    Priority: Low  . Overweight 08/25/2014    Priority: Low  . 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 Actinic keratosis 10/24/2009    Priority: Low  . GERD 05/11/2007    Priority: Low    Medications- reviewed and updated Current Outpatient Medications  Medication Sig Dispense Refill  . diltiazem (CARDIZEM CD) 120 MG 24 hr capsule TAKE 1 CAPSULE BY MOUTH EVERY DAY 90 capsule 3  . diltiazem (CARDIZEM) 60 MG tablet Take 1 tablet (60 mg total) by mouth daily as needed. For palpitations. 30 tablet 8  . ELIQUIS 5 MG TABS tablet TAKE 1 TABLET BY MOUTH TWICE A DAY 180 tablet 1  . flecainide (TAMBOCOR) 100 MG tablet TAKE 1 TABLET BY MOUTH TWICE A DAY 180 tablet 3  . rizatriptan (MAXALT-MLT) 10 MG disintegrating tablet Take daily as needed for migraine May repeat in 2 hours if needed x1 9 tablet 2  . rosuvastatin (CRESTOR) 20 MG tablet Take 1 tablet (20 mg total) by mouth daily. 90 tablet 3   No current facility-administered  medications for this visit.     Objective:  BP 108/74   Pulse 77   Temp 98.3 F (36.8 C) (Temporal)   Ht 6\' 2"  (1.88 m)   Wt 224 lb (101.6 kg)   SpO2 96%   BMI 28.76 kg/m  Gen: NAD, resting comfortably  CV: RRR  Lungs: nonlabored, normal respiratory rate Abdomen: soft/nondistended   MSK: Swelling over right olecranon bursa-mobile and soft.  Minimal erythema over the area.  Only slightly tender to touch.   Consent for aspiration of olecranon bursa : Risks and benefits discussed with patient and verbal consent obtained  The R Elbow was prepped.  Sterile gloves were used.  Site marked and prepared in sterile fashion. 21 cc of yellow Fluid was removed from the joint space- some bloody drainage toward the end.  Samples were sent to the lab for analysis.  21 cc of yellow, Bloody colored was fluid removed  Estimated Blood Loss: minimal   Complications:  The patient tolerated the procedure well without complications. Aftercare and return precautions were discussed.    Assessment and Plan   # Knot on Elbow  S:noticed about three weeks ago. Has had swelling. Not a lot of pain unless he bumps it. Denies any injury. Does sit at desk so may have been hitting the elbow repeatedly- has repositioned.  Has tried Ace bandage without relief-actually seems like it is getting slightly worse.  He  has tried icing A/P: Patient with right elbow swelling for 3 weeks and swelling over olecranon bursa-this clearly looks like an olecranon bursitis on the right.  He is already completed conservative treatment at home without relief.  We discussed the option of aspiration/drainage-he would like to proceed.  Counseling was provided about benefits and risks.  He verbally consents to treatment.  He knows if he has expanding redness, worsening pain, fever to let us know immediately. -Ace bandage applied today.  Encouraged patient to purchase compression sleeve and use this. -If area reaccumulates I recommended  sports medicine referral   # Atrial fibrillation-cardiologist Dr. [name] #Chronic anticoagulation S: Antiarrhythmic-flecainide 100 mg twice daily  Rate controlled with diltiazem 120 mg extended release.  Rare diltiazem 60 mg as needed for palpitations-patient reports very rare  Anticoagulated with Eliquis 5 mg twice daily  Last visit with cardiology: September 11, 2019 A/P: Atrial fibrillation-appropriately anticoagulated and rate controlled. Very rare a fib breakthrough  #hyperlipidemia S: Medication: Rosuvastatin 20 mg daily  Lab Results  Component Value Date   CHOL 103 10/16/2019   HDL 46 10/16/2019   LDLCALC 43 10/16/2019   LDLDIRECT 96.1 09/02/2013   TRIG 65 10/16/2019   CHOLHDL 2.2 10/16/2019   A/P: Extremely well controlled on rosuvastatin 20 mg.  We did discuss slight increased risk of diabetes-I still think the benefits outweigh the risks   #Migraines S: Frequency has improved since patient has had weight loss.  He uses Maxalt 10 mg as needed-typically 3x a year  A/P: Stable and very well controlled-continue current as needed medication  #Thrombocytopenia-patient with mild intermittent thrombocytopenia.  Rest of cell lines are normal.  Continue to monitor at least yearly  #History of melanoma-follows agrees with dermatology specialist every 6 months  #See after visit summary for health maintenance items-Cologuard and Shingrix.  Recommended for Shingrix until this area has healed  Recommended follow up: physical next january Future Appointments  Date Time Provider [place]  08/23/2020  8:20 AM [name], MD [facility]   Lab/Order associations:   ICD-10-CM   1. Olecranon bursitis of right elbow  M70.21   2. Hyperlipidemia, unspecified hyperlipidemia type  E78.5   3. PAF (paroxysmal atrial fibrillation) (HCC)  I48.0   4. Thrombocytopenia ([place])  D69.6   5. Migraine with aura and without status migrainosus, not intractable  G43.109   6. History of  melanoma  Z85.820    Return precautions advised.  Garret Reddish, MD

## 2019-12-02 NOTE — Patient Instructions (Addendum)
Health Maintenance Due  Topic Date Due  . Fecal DNA (Cologuard) pt has kit will send in soon  Never done   Bursitis  May want to look at getting a compression sleeve for future treatment instead of Ace wrap.  Continue making changes in how you have things placed at desk.   If you have any redness, increased pain, fever or any changes in systems give our call as soon as possible or go to ED for evaluation.   If it returns give our office a call back so that we can start a Sports med referral.    Recommended follow up: No follow-ups on file.

## 2019-12-15 ENCOUNTER — Other Ambulatory Visit: Payer: Self-pay

## 2019-12-15 ENCOUNTER — Encounter: Payer: Self-pay | Admitting: Family Medicine

## 2019-12-15 DIAGNOSIS — M7021 Olecranon bursitis, right elbow: Secondary | ICD-10-CM

## 2019-12-16 ENCOUNTER — Ambulatory Visit: Payer: Self-pay

## 2019-12-16 ENCOUNTER — Other Ambulatory Visit: Payer: Self-pay

## 2019-12-16 ENCOUNTER — Encounter: Payer: Self-pay | Admitting: Family Medicine

## 2019-12-16 ENCOUNTER — Ambulatory Visit (INDEPENDENT_AMBULATORY_CARE_PROVIDER_SITE_OTHER): Payer: BC Managed Care – PPO | Admitting: Family Medicine

## 2019-12-16 VITALS — BP 122/80 | HR 75 | Ht 74.0 in | Wt 226.4 lb

## 2019-12-16 DIAGNOSIS — R2231 Localized swelling, mass and lump, right upper limb: Secondary | ICD-10-CM | POA: Diagnosis not present

## 2019-12-16 DIAGNOSIS — M7021 Olecranon bursitis, right elbow: Secondary | ICD-10-CM | POA: Diagnosis not present

## 2019-12-16 NOTE — Patient Instructions (Addendum)
I recommend you obtained a compression sleeve to help with your joint problems. There are many options on the market however I recommend obtaining a elbow Body Helix compression sleeve.  You can find information (including how to appropriate measure yourself for sizing) can be found at www.Body http://www.lambert.com/.  Many of these products are health savings account (HSA) eligible.   You can use the compression sleeve at any time throughout the day but is most important to use while being active as well as for 2 hours post-activity.   It is appropriate to ice following activity with the compression sleeve in place.   If this comes back let me know.   Mychart is a great to ask questions or give updated.  I try to respond within 1 buisnes day.  For urgent issues call the office.   Recheck in 1 month unless better.   Call or go to the ER if you develop a large red swollen joint with extreme pain or oozing puss.    Elbow Bursitis Rehab Ask your health care provider which exercises are safe for you. Do exercises exactly as told by your health care provider and adjust them as directed. It is normal to feel mild stretching, pulling, tightness, or discomfort as you do these exercises. Stop right away if you feel sudden pain or your pain gets worse. Do not begin these exercises until told by your health care provider. Stretching and range-of-motion exercises These exercises warm up your muscles and joints and improve the movement and flexibility of your elbow. The exercises also help to relieve pain and swelling. Elbow flexion, assisted 1. Stand or sit with your left / right arm at your side. 2. Use your other hand to gently push your left / right hand toward your shoulder (assisted) while bending your elbow (flexion). 3. Hold this position for __________ seconds. 4. Slowly return your left / right arm to the starting position. Repeat __________ times. Complete this exercise __________ times a day. Elbow  extension, assisted 1. Lie on your back in a comfortable position that allows you to relax your arm muscles. 2. Place a folded towel under your left / right upper arm so that your elbow and shoulder are at the same height. 3. Use your other arm to raise your left / right arm (assisted) until your elbow and hand do not rest on the bed or towel. Hold your left / right arm out straight with your other hand supporting it. 4. Let the weight of your hand straighten your elbow (extension). You should feel a stretch on the inside of your elbow. ? Keep your arm and chest muscles relaxed. ? If directed, add a small wrist weight or hand weight to increase the stretch. 5. Hold this position for __________ seconds. 6. Slowly release the stretch and return to the starting position. Repeat __________ times. Complete this exercise __________ times a day. Elbow flexion, active 1. Stand or sit with your left / right elbow bent and your palm facing in, toward your body. 2. Bend your elbow as far as you can using only your arm muscles (active flexion). 3. Hold this position for __________ seconds. 4. Slowly return to the starting position. Repeat __________ times. Complete this exercise __________ times a day. Elbow extension, active 1. Stand or sit with your left / right elbow bent and your palm facing in, toward your body. 2. Slowly straighten your elbow using only your arm muscles (active extension). Stop when you feel a  gentle stretch at the front of your arm, or when your arm is straight. 3. Hold this position for __________ seconds. 4. Slowly return to the starting position. Repeat __________ times. Complete this exercise __________ times a day. Strengthening exercises These exercises build strength and endurance in your elbow. Endurance is the ability to use your muscles for a long time, even after they get tired. Elbow flexion, isometric 1. Stand or sit with your left / right arm at waist height. Your  palm should face in, toward your body. 2. Place your other hand on top of your left / right forearm. Gently push down while you resist with your left / right arm (isometric flexion). ? Use about 50% effort with both arms. You may be instructed to use more and more effort with your arms each week. ? Try not to let your left / right arm move during the exercise. 3. Hold this position for __________ seconds. 4. Let your muscles relax completely before you repeat the exercise. Repeat __________ times. Complete this exercise __________ times a day. Elbow extension, isometric  1. Stand or sit with your left / right arm at waist height. Your palm should face in, toward your body. 2. Place your other hand on the bottom of your left / right forearm. Gently push up while you resist with your left / right arm (isometric extension). ? Use about 50% effort with both arms. You may be instructed to use more and more effort with your arms each week. ? Try not to let your left / right arm move during the exercise. 3. Hold this position for __________ seconds. 4. Let your muscles relax completely before you repeat the exercise. Repeat __________ times. Complete this exercise __________ times a day. Biceps curls 1. Sit on a stable chair without armrests, or stand up. 2. Hold a _________ weight in your left / right hand. Your palm should face out, away from your body, at the starting position. 3. Bend your left / right elbow and move your hand up toward your shoulder. Keep your elbow at your side while you bend it. 4. Slowly return to the starting position. Repeat __________ times. Complete this exercise __________ times a day. Triceps curls  1. Lie on your back. 2. Hold a _________ weight in your left / right hand. 3. Bend your left / right elbow to a 90-degree angle (right angle), so the weight is in front of your face, over your chest, and your elbow is pointed up to the ceiling. 4. Straighten your elbow,  raising your hand toward the ceiling. Use your other hand to support your left / right upper arm and to keep it still. 5. Slowly return to the starting position. Repeat __________ times. Complete this exercise __________ times a day. This information is not intended to replace advice given to you by your health care provider. Make sure you discuss any questions you have with your health care provider. Document Revised: 11/13/2018 Document Reviewed: 08/13/2018 Elsevier Patient Education  [place].

## 2019-12-16 NOTE — Addendum Note (Signed)
Addended by: Cresenciano Lick on: 12/16/2019 02:55 PM   Modules accepted: Orders

## 2019-12-16 NOTE — Progress Notes (Signed)
Subjective:    I'm seeing this patient as a consultation for:  Dr. Yong Channel. Note will be routed back to referring provider/PCP.  CC: R elbow bursitis  I, Wendy Poet, LAT, ATC, am serving as scribe for Dr. Lynne Leader.  HPI: Pt is a 64 y/o male presenting w/ c/o R elbow pain and swelling since early April 2021 w/ no known MOI.  He has seen his PCP for this and his R olecranon bursa drained on 12/02/19.  No steroid injection was performed at the time of aspiration.  Since then, he reports that his R elbow swelled up again about 2-3 days after his elbow was drained on 12/02/19.  He states that his R elbow is slightly worse than before.  Radiating pain: No R elbow swelling: yes at his R olecranon Aggravating factors: pressure to R olecranon process; Treatments tried: Ice, R elbow brace;  Past medical history, Surgical history, Family history, Social history, Allergies, and medications have been entered into the medical record, reviewed.   Review of Systems: No new headache, visual changes, nausea, vomiting, diarrhea, constipation, dizziness, abdominal pain, skin rash, fevers, chills, night sweats, weight loss, swollen lymph nodes, body aches, joint swelling, muscle aches, chest pain, shortness of breath, mood changes, visual or auditory hallucinations.   Objective:    Vitals:   12/16/19 1255  BP: 122/80  Pulse: 75  SpO2: 96%   General: Well Developed, well nourished, and in no acute distress.  Neuro/Psych: Alert and oriented x3, extra-ocular muscles intact, able to move all 4 extremities, sensation grossly intact. Skin: Warm and dry, no rashes noted.  Respiratory: Not using accessory muscles, speaking in full sentences, trachea midline.  Cardiovascular: Pulses palpable, no extremity edema. Abdomen: Does not appear distended. MSK: Right elbow large soft mass overlying olecranon consistent appearance with olecranon bursitis.  No skin erythema.  Normal elbow motion strength.  Pulses cap  refill and sensation are intact distally.  Lab and Radiology Results  Procedure: Real-time Ultrasound Guided aspiration injection of right olecranon bursa Device: Philips Affiniti 50G Images permanently stored and available for review in the ultrasound unit. Verbal informed consent obtained.  Discussed risks and benefits of procedure. Warned about infection bleeding damage to structures skin hypopigmentation and fat atrophy among others. Patient expresses understanding and agreement Time-out conducted.   Noted no overlying erythema, induration, or other signs of local infection.   Skin prepped in a sterile fashion.   Local anesthesia: Topical Ethyl chloride.   With sterile technique and under real time ultrasound guidance:  1.5 mL of lidocaine injected subcutaneously and into bursa sac along planned aspiration tract.  Skin was again sterilized with rubbing alcohol. 18-gauge needle was used to access the bursa sac.  Approximately 5 mL of blood-tinged fluid was aspirated and then despite attempts to reposition the needle no further fluid was able to be aspirated.  The 18-gauge needle was thought to be potentially obstructed by a plug of synovium or other soft tissue. The needle was removed and I had a discussion with the patient.  He agreed to attempt to reaspirate the olecranon bursa sac. The skin was again sterilized with isopropyl alcohol. A new 18-gauge needle and syringe was under ultrasound guidance inserted into the bursa sac.  Care was taken to avoid synovium. 10 mL of blood-tinged fluid was aspirated decompressing the sac to a great extent. Syringe was exchanged and 40 mg of Kenalog and 1 mL of Marcaine were injected. Dressing and Ace wrap was applied.  Pain immediately resolved suggesting accurate placement of the medication.   Advised to call if fevers/chills, erythema, induration, drainage, or persistent bleeding.   Images permanently stored and available for review in the  ultrasound unit.  Impression: Technically successful ultrasound guided injection.       Impression and Recommendations:    Assessment and Plan: 64 y.o. male with right elbow olecranon bursitis.  Ongoing for several weeks now.  Patient had aspiration attempt a few weeks ago with padding.  He did not have much compression and did not have a steroid component to the aspiration. Olecranon bursa sac was aspirated again today and injected with steroid. Stressed the importance of compression and padding.  Recommend body helix elbow sleeve.  Recheck back as needed.  Since this is the olecranon bursa is a recurrence we will go ahead and send sample of fluid off for culture.Marland Kitchen  PDMP not reviewed this encounter. Orders Placed This Encounter  Procedures  . Anaerobic and Aerobic Culture    R elbow joint fluid culture please    Standing Status:   Future    Standing Expiration Date:   01/16/2020  . [place] LIMITED JOINT SPACE STRUCTURES UP RIGHT(NO LINKED CHARGES)    Order Specific Question:   Reason for Exam (SYMPTOM  OR DIAGNOSIS REQUIRED)    Answer:   R olecranon bursitis    Order Specific Question:   Preferred imaging location?    Answer:   [facility]   No orders of the defined types were placed in this encounter.   Discussed warning signs or symptoms. Please see discharge instructions. Patient expresses understanding.   The above documentation has been reviewed and is accurate and complete Lynne Leader

## 2019-12-18 NOTE — Progress Notes (Signed)
Culture joint fluid from olecranon bursa aspiration is negative so far.  It is still pending however.

## 2019-12-21 NOTE — Progress Notes (Signed)
Joint culture fluid is negative

## 2019-12-22 LAB — ANAEROBIC AND AEROBIC CULTURE
AER RESULT:: NO GROWTH
MICRO NUMBER:: 10469215
MICRO NUMBER:: 10469216
SPECIMEN QUALITY:: ADEQUATE
SPECIMEN QUALITY:: ADEQUATE

## 2019-12-23 ENCOUNTER — Other Ambulatory Visit: Payer: Self-pay

## 2019-12-23 ENCOUNTER — Ambulatory Visit (INDEPENDENT_AMBULATORY_CARE_PROVIDER_SITE_OTHER): Payer: BC Managed Care – PPO

## 2019-12-23 DIAGNOSIS — Z23 Encounter for immunization: Secondary | ICD-10-CM | POA: Diagnosis not present

## 2019-12-23 NOTE — Progress Notes (Signed)
Per orders of Dr. Hunter, injection of 2nd Shingrix given by Raianna Slight in left deltoid. Patient tolerated injection well. 

## 2020-01-05 ENCOUNTER — Encounter: Payer: Self-pay | Admitting: Family Medicine

## 2020-01-05 DIAGNOSIS — Z1211 Encounter for screening for malignant neoplasm of colon: Secondary | ICD-10-CM | POA: Diagnosis not present

## 2020-01-05 DIAGNOSIS — Z0000 Encounter for general adult medical examination without abnormal findings: Secondary | ICD-10-CM | POA: Diagnosis not present

## 2020-01-09 LAB — COLOGUARD: COLOGUARD: NEGATIVE

## 2020-01-13 ENCOUNTER — Ambulatory Visit: Payer: BC Managed Care – PPO | Admitting: Family Medicine

## 2020-01-22 LAB — COLOGUARD: Cologuard: NEGATIVE

## 2020-03-15 ENCOUNTER — Other Ambulatory Visit: Payer: Self-pay | Admitting: Cardiology

## 2020-03-15 DIAGNOSIS — Z5181 Encounter for therapeutic drug level monitoring: Secondary | ICD-10-CM

## 2020-03-15 DIAGNOSIS — Z79899 Other long term (current) drug therapy: Secondary | ICD-10-CM

## 2020-03-15 DIAGNOSIS — I10 Essential (primary) hypertension: Secondary | ICD-10-CM

## 2020-03-15 DIAGNOSIS — I480 Paroxysmal atrial fibrillation: Secondary | ICD-10-CM

## 2020-03-15 NOTE — Telephone Encounter (Signed)
Pt last saw Dr [name] 09/11/19, last labs 08/20/19 Creat 0.87, age 64, weight 102.7kg, based on specified criteria pt is on appropriate dosage of Eliquis 5mg  BID.  Will refill rx.

## 2020-08-23 ENCOUNTER — Encounter: Payer: BC Managed Care – PPO | Admitting: Family Medicine

## 2020-09-13 ENCOUNTER — Encounter: Payer: BC Managed Care – PPO | Admitting: Family Medicine

## 2020-10-01 ENCOUNTER — Other Ambulatory Visit: Payer: Self-pay | Admitting: Cardiology

## 2020-10-01 DIAGNOSIS — I480 Paroxysmal atrial fibrillation: Secondary | ICD-10-CM

## 2020-10-01 DIAGNOSIS — I10 Essential (primary) hypertension: Secondary | ICD-10-CM

## 2020-10-01 DIAGNOSIS — Z5181 Encounter for therapeutic drug level monitoring: Secondary | ICD-10-CM

## 2020-10-19 ENCOUNTER — Encounter: Payer: BC Managed Care – PPO | Admitting: Family Medicine

## 2020-10-22 ENCOUNTER — Encounter (HOSPITAL_COMMUNITY): Payer: Self-pay | Admitting: Emergency Medicine

## 2020-10-22 ENCOUNTER — Emergency Department (HOSPITAL_COMMUNITY)
Admission: EM | Admit: 2020-10-22 | Discharge: 2020-10-22 | Disposition: A | Discharge status: Home / Self Care | Payer: BC Managed Care – PPO | Attending: Emergency Medicine | Admitting: Emergency Medicine

## 2020-10-22 ENCOUNTER — Other Ambulatory Visit: Payer: Self-pay

## 2020-10-22 DIAGNOSIS — Z7901 Long term (current) use of anticoagulants: Secondary | ICD-10-CM | POA: Diagnosis not present

## 2020-10-22 DIAGNOSIS — R109 Unspecified abdominal pain: Secondary | ICD-10-CM

## 2020-10-22 DIAGNOSIS — R112 Nausea with vomiting, unspecified: Secondary | ICD-10-CM | POA: Insufficient documentation

## 2020-10-22 DIAGNOSIS — Z87442 Personal history of urinary calculi: Secondary | ICD-10-CM | POA: Diagnosis not present

## 2020-10-22 DIAGNOSIS — I4891 Unspecified atrial fibrillation: Secondary | ICD-10-CM | POA: Diagnosis not present

## 2020-10-22 DIAGNOSIS — R10A0 Flank pain, unspecified side: Secondary | ICD-10-CM

## 2020-10-22 HISTORY — DX: Calculus of kidney: N20.0

## 2020-10-22 LAB — URINALYSIS, ROUTINE W REFLEX MICROSCOPIC
Bacteria, UA: NONE SEEN
Bilirubin Urine: NEGATIVE
Glucose, UA: NEGATIVE mg/dL
Ketones, ur: NEGATIVE mg/dL
Leukocytes,Ua: NEGATIVE
Nitrite: NEGATIVE
Protein, ur: NEGATIVE mg/dL
Specific Gravity, Urine: 1.02 (ref 1.005–1.030)
pH: 5 (ref 5.0–8.0)

## 2020-10-22 MED ORDER — OXYCODONE-ACETAMINOPHEN 5-325 MG PO TABS: 1.0000 | ORAL_TABLET | ORAL | 0 refills | Status: DC | PRN

## 2020-10-22 NOTE — ED Provider Notes (Signed)
[place] EMERGENCY DEPARTMENT Provider Note   CSN: [id number] Arrival date & time: 10/22/20  1401     History No chief complaint on file.   Jorge Kelly is a 65 y.o. male.  65 year old male presents with acute onset of right-sided flank pain similar to his prior kidney stones.  Denies any hematuria.  Pain was so severe that it caused nausea with emesis x1.  Pain resolved spontaneously and he feels as if the kidney stones in his bladder.  Denies any testicular pain.  States he feels back to his baseline        Past Medical History:  Diagnosis Date  . Actinic keratosis 10/24/2009  . ALLERGIC RHINITIS 05/11/2007  . ELEVATED BLOOD PRESSURE WITHOUT DIAGNOSIS OF HYPERTENSION 11/12/2007   Dr. Debara Pickett mentioned HTN on problem list  . Esophageal stricture    treated with protonix  . GERD 05/11/2007  . GI bleeding    20 years ago  . Kidney stone   . Melanoma ([place]) 01/2014   remoted from forehead  . MIGRAINE HEADACHE 05/11/2007  . Overweight   . Persistent atrial fibrillation ([place]) 05/2014  . Snores     Patient Active Problem List   Diagnosis Date Noted  . Thrombocytopenia ([place]) 12/02/2019  . Hyperlipidemia, unspecified 08/20/2019  . Inguinal hernia 12/10/2014  . Snoring 08/25/2014  . Overweight 08/25/2014  . 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 PAF (paroxysmal atrial fibrillation) ([place]) 05/21/2014  . History of melanoma 01/04/2014  . Actinic keratosis 10/24/2009  . Migraine 05/11/2007  . GERD 05/11/2007    Past Surgical History:  Procedure Laterality Date  . ANKLE FRACTURE SURGERY  2010   x2-left  . CARDIOVERSION N/A 06/23/2014  . CARDIOVERSION N/A 09/09/2014  . malignant melanoma     removed from forehead in June 2015  . STRABISMUS SURGERY Right 05/21/2014   Dr. Annamaria Boots  . UPPER GI ENDOSCOPY     stricture, just PPI       Family History  Problem Relation Age of Onset  . Lymphoma Mother   . Atrial fibrillation Mother        pacemaker in 24s  .  Leukemia Father        passed from CLL  . Cancer Sister        treated, cancer free  . Diabetes type II Sister   . Heart disease Maternal Uncle        mother lost 6 brothers to heart disease in 29s    Social History   Tobacco Use  . Smoking status: Never Smoker  . Smokeless tobacco: Never Used  Vaping Use  . Vaping Use: Never used  Substance Use Topics  . Alcohol use: No    Alcohol/week: 0.0 standard drinks  . Drug use: No    Home Medications Prior to Admission medications   Medication Sig Start Date End Date Taking? Authorizing Provider  diltiazem (CARDIZEM CD) 120 MG 24 hr capsule TAKE 1 CAPSULE BY MOUTH EVERY DAY 09/15/19   Dorothy Spark, MD  diltiazem (CARDIZEM) 60 MG tablet Take 1 tablet (60 mg total) by mouth daily as needed. For palpitations. 08/25/18   Dorothy Spark, MD  ELIQUIS 5 MG TABS tablet TAKE 1 TABLET BY MOUTH TWICE A DAY 03/15/20   Dorothy Spark, MD  flecainide (TAMBOCOR) 100 MG tablet Take 1 tablet (100 mg total) by mouth 2 (two) times daily. Please make overdue appt with Dr. [name] before anymore refills. Thank you 1st attempt  10/03/20   Dorothy Spark, MD  rizatriptan (MAXALT-MLT) 10 MG disintegrating tablet Take daily as needed for migraine May repeat in 2 hours if needed x1 08/20/19   [name], MD  rosuvastatin (CRESTOR) 20 MG tablet Take 1 tablet (20 mg total) by mouth daily. 09/11/19   Dorothy Spark, MD    Allergies    Patient has no known allergies.  Review of Systems   Review of Systems  All other systems reviewed and are negative.   Physical Exam Updated Vital Signs BP (!) 147/108 (BP Location: Right Arm)   Pulse 98   Temp (!) 97.5 F (36.4 C) (Oral)   Resp 16   SpO2 99%   Physical Exam Vitals and nursing note reviewed.  Constitutional:      General: He is not in acute distress.    Appearance: Normal appearance. He is well-developed. He is not toxic-appearing.  HENT:     Head: Normocephalic and atraumatic.   Eyes:     General: Lids are normal.     Conjunctiva/sclera: Conjunctivae normal.     Pupils: Pupils are equal, round, and reactive to light.  Neck:     Thyroid: No thyroid mass.     Trachea: No tracheal deviation.  Cardiovascular:     Rate and Rhythm: Normal rate and regular rhythm.     Heart sounds: Normal heart sounds. No murmur heard. No gallop.   Pulmonary:     Effort: Pulmonary effort is normal. No respiratory distress.     Breath sounds: Normal breath sounds. No stridor. No decreased breath sounds, wheezing, rhonchi or rales.  Abdominal:     General: Bowel sounds are normal. There is no distension.     Palpations: Abdomen is soft.     Tenderness: There is no abdominal tenderness. There is no rebound.  Musculoskeletal:        General: No tenderness. Normal range of motion.     Cervical back: Normal range of motion and neck supple.  Skin:    General: Skin is warm and dry.     Findings: No abrasion or rash.  Neurological:     Mental Status: He is alert and oriented to person, place, and time.     GCS: GCS eye subscore is 4. GCS verbal subscore is 5. GCS motor subscore is 6.     Cranial Nerves: No cranial nerve deficit.     Sensory: No sensory deficit.  Psychiatric:        Speech: Speech normal.        Behavior: Behavior normal.     ED Results / Procedures / Treatments   Labs (all labs ordered are listed, but only abnormal results are displayed) Labs Reviewed  URINALYSIS, ROUTINE W REFLEX MICROSCOPIC - Abnormal; Notable for the following components:      Result Value   Hgb urine dipstick MODERATE (*)    All other components within normal limits    EKG None  Radiology No results found.  Procedures Procedures   Medications Ordered in ED Medications - No data to display  ED Course  I have reviewed the triage vital signs and the nursing notes.  Pertinent labs & imaging results that were available during my care of the patient were reviewed by me and  considered in my medical decision making (see chart for details).    MDM Rules/Calculators/A&P  Patient offered renal CT and has deferred.  He is truly pain-free at this time.  Will prescribe pain medication in case it comes back and he will return should his symptoms worsen Final Clinical Impression(s) / ED Diagnoses Final diagnoses:  None    Rx / DC Orders ED Discharge Orders    None       Lacretia Leigh, MD 10/22/20 1924

## 2020-10-22 NOTE — ED Triage Notes (Signed)
C/o R flank pain x 2 hours.  States pain resolved after arrival.  Also vomited x 1.  Denies nausea at present.  Pt states he has a history of kidney stones 15 years ago and believes he may have just passed the stone.  Denies dysuria.

## 2020-10-31 ENCOUNTER — Other Ambulatory Visit: Payer: Self-pay | Admitting: Cardiology

## 2020-10-31 DIAGNOSIS — Z5181 Encounter for therapeutic drug level monitoring: Secondary | ICD-10-CM

## 2020-10-31 DIAGNOSIS — Z79899 Other long term (current) drug therapy: Secondary | ICD-10-CM

## 2020-10-31 DIAGNOSIS — I10 Essential (primary) hypertension: Secondary | ICD-10-CM

## 2020-10-31 DIAGNOSIS — I480 Paroxysmal atrial fibrillation: Secondary | ICD-10-CM

## 2020-11-01 ENCOUNTER — Other Ambulatory Visit: Payer: Self-pay | Admitting: Cardiology

## 2020-11-01 DIAGNOSIS — Z79899 Other long term (current) drug therapy: Secondary | ICD-10-CM

## 2020-11-01 DIAGNOSIS — I10 Essential (primary) hypertension: Secondary | ICD-10-CM

## 2020-11-01 DIAGNOSIS — I480 Paroxysmal atrial fibrillation: Secondary | ICD-10-CM

## 2020-11-01 DIAGNOSIS — Z5181 Encounter for therapeutic drug level monitoring: Secondary | ICD-10-CM

## 2020-11-11 ENCOUNTER — Other Ambulatory Visit: Payer: Self-pay | Admitting: Cardiology

## 2020-11-11 DIAGNOSIS — I2510 Atherosclerotic heart disease of native coronary artery without angina pectoris: Secondary | ICD-10-CM

## 2020-11-11 DIAGNOSIS — I2584 Coronary atherosclerosis due to calcified coronary lesion: Secondary | ICD-10-CM

## 2020-11-11 DIAGNOSIS — Z8249 Family history of ischemic heart disease and other diseases of the circulatory system: Secondary | ICD-10-CM

## 2020-11-11 DIAGNOSIS — E785 Hyperlipidemia, unspecified: Secondary | ICD-10-CM

## 2020-11-11 DIAGNOSIS — Z5181 Encounter for therapeutic drug level monitoring: Secondary | ICD-10-CM

## 2020-11-11 DIAGNOSIS — Z79899 Other long term (current) drug therapy: Secondary | ICD-10-CM

## 2020-11-11 DIAGNOSIS — I10 Essential (primary) hypertension: Secondary | ICD-10-CM

## 2020-11-11 DIAGNOSIS — I480 Paroxysmal atrial fibrillation: Secondary | ICD-10-CM

## 2020-11-16 ENCOUNTER — Other Ambulatory Visit: Payer: Self-pay | Admitting: Cardiology

## 2020-11-16 DIAGNOSIS — Z5181 Encounter for therapeutic drug level monitoring: Secondary | ICD-10-CM

## 2020-11-16 DIAGNOSIS — I10 Essential (primary) hypertension: Secondary | ICD-10-CM

## 2020-11-16 DIAGNOSIS — Z79899 Other long term (current) drug therapy: Secondary | ICD-10-CM

## 2020-11-16 DIAGNOSIS — I480 Paroxysmal atrial fibrillation: Secondary | ICD-10-CM

## 2020-12-26 NOTE — Progress Notes (Signed)
Cardiology Office Note:    Date:  12/28/2020   ID:  Jorge Kelly, DOB [birth year], MRN [id number]  PCP:  [name], MD   [facility] Providers Cardiologist:  Ena Dawley, MD (Inactive)     Referring MD: [name], MD    History of Present Illness:    Jorge Kelly is a 65 y.o. male with a hx of pAfib and GERD who was previously followed by Dr. [name] who now presents to clinic for follow-up of his pAfib.  Patient has a long-standing history of atrial fibrillation for which he was started on flecainide. Last saw Dr. [name] in 09/2019 where he was doing well with only brief and infrequent episodes of Afib.  Today, the patient states that he overall doing well. He notes some weight gain over the past year which he is frustrated about, but he is motivated to lose it with diet and exercise. He is currently in Afib but is asymptomatic. He tends to flip in and out of rhythm and has undergone 3 DCCV in the past.  Has been maintained on flecainide. Admits to not being as compliant with his antiarrhythmics as he should be and had been out of his medications for several days before resuming them. No chest pain, palpitations, lightheadedness, dizziness, syncope or LE edema. Tolerating apixaban without bleeding.  Past Medical History:  Diagnosis Date  . Actinic keratosis 10/24/2009  . ALLERGIC RHINITIS 05/11/2007  . ELEVATED BLOOD PRESSURE WITHOUT DIAGNOSIS OF HYPERTENSION 11/12/2007   Dr. Debara Pickett mentioned HTN on problem list  . Esophageal stricture    treated with protonix  . GERD 05/11/2007  . GI bleeding    20 years ago  . Kidney stone   . Melanoma ([place]) 01/2014   remoted from forehead  . MIGRAINE HEADACHE 05/11/2007  . Overweight   . Persistent atrial fibrillation ([place]) 05/2014  . Snores     Past Surgical History:  Procedure Laterality Date  . ANKLE FRACTURE SURGERY  2010   x2-left  . CARDIOVERSION N/A 06/23/2014  . CARDIOVERSION N/A 09/09/2014  . malignant melanoma      removed from forehead in June 2015  . STRABISMUS SURGERY Right 05/21/2014   Dr. Annamaria Boots  . UPPER GI ENDOSCOPY     stricture, just PPI    Current Medications: Current Meds  Medication Sig  . diltiazem (CARDIZEM) 60 MG tablet Take 1 tablet (60 mg total) by mouth daily as needed. For palpitations.  Marland Kitchen oxyCODONE-acetaminophen (PERCOCET/ROXICET) 5-325 MG tablet Take 1-2 tablets by mouth every 4 (four) hours as needed for severe pain.  . rizatriptan (MAXALT-MLT) 10 MG disintegrating tablet Take daily as needed for migraine May repeat in 2 hours if needed x1  . [DISCONTINUED] diltiazem (CARDIZEM CD) 120 MG 24 hr capsule TAKE 1 CAPSULE BY MOUTH EVERY DAY  . [DISCONTINUED] ELIQUIS 5 MG TABS tablet TAKE 1 TABLET BY MOUTH TWICE A DAY  . [DISCONTINUED] flecainide (TAMBOCOR) 100 MG tablet TAKE 1 TABLET BY MOUTH 2 TIMES DAILY. PLEASE MAKE OVERDUE APPT WITH CARDIOLOGIST BEFORE ANYMORE REFILLS. THANK YOU 2ND ATTEMPT  . [DISCONTINUED] rosuvastatin (CRESTOR) 20 MG tablet TAKE 1 TABLET BY MOUTH EVERY DAY     Allergies:   Patient has no known allergies.   Social History   Socioeconomic History  . Marital status: Married    Spouse name: Not on file  . Number of children: Not on file  . Years of education: Not on file  . Highest education level: Not on  file  Occupational History  . Occupation: Attorney  Tobacco Use  . Smoking status: Never Smoker  . Smokeless tobacco: Never Used  Vaping Use  . Vaping Use: Never used  Substance and Sexual Activity  . Alcohol use: No    Alcohol/week: 0.0 standard drinks  . Drug use: No  . Sexual activity: Not on file  Other Topics Concern  . Not on file  Social History Narrative   Lives in [place] with wife (wife patient at [facility]).  2 sons.- 21 Logan at [employer] and [place] at [employer] in [place]  Wife with pregnancy at 44 and December 03, 2037.       Trial lawyer. Criminal defense      Hobbies: sails usually in [place]    Social Determinants of Health    Financial Resource Strain: Not on file  Food Insecurity: Not on file  Transportation Needs: Not on file  Physical Activity: Not on file  Stress: Not on file  Social Connections: Not on file     Family History: The patient's family history includes Atrial fibrillation in his mother; Cancer in his sister; Diabetes type II in his sister; Heart disease in his maternal uncle; Leukemia in his father; Lymphoma in his mother.  ROS:   Please see the history of present illness.    Review of Systems  Constitutional: Negative for chills and fever.  HENT: Negative for sore throat.   Eyes: Negative for blurred vision.  Respiratory: Negative for shortness of breath.   Cardiovascular: Negative for chest pain, palpitations, orthopnea, claudication, leg swelling and PND.  Gastrointestinal: Negative for blood in stool, nausea and vomiting.  Genitourinary: Negative for hematuria.  Musculoskeletal: Negative for falls.  Neurological: Negative for dizziness and loss of consciousness.  Psychiatric/Behavioral: Negative for substance abuse.    EKGs/Labs/Other Studies Reviewed:    The following studies were reviewed today: TTE December 03, 2013: Study Conclusions   - Left ventricle: The cavity size was normal. There was mild  concentric hypertrophy. Systolic function was normal. The  estimated ejection fraction was in the range of 50% to 55%. Wall  motion was normal; there were no regional wall motion  abnormalities. The study was not technically sufficient to allow  evaluation of LV diastolic dysfunction due to atrial  fibrillation.  - Aortic valve: Structurally normal valve. There was mild  regurgitation.  - Ascending aorta: The ascending aorta was normal in size.  - Mitral valve: Structurally normal valve. There was mild  regurgitation.  - Left atrium: The atrium was moderately dilated.  - Right ventricle: The cavity size was mildly dilated. Wall  thickness was normal. Systolic  function was normal.  - Right atrium: The atrium was mildly dilated.  - Tricuspid valve: There was mild regurgitation.  - Pulmonic valve: There was no regurgitation.  - Pulmonary arteries: Systolic pressure was within the normal  range.  - Inferior vena cava: The vessel was dilated. The respirophasic  diameter changes were blunted (< 50%), consistent with elevated  central venous pressure.  - Pericardium, extracardiac: The pericardium was normal in  appearance.  Myoview Dec 03, 2013: Normal stress with no infarct or ischemia  Coronary Calcium Score 09/2019: FINDINGS: Non-cardiac: See separate report from [facility].  Ascending Aorta: Normal size, minimal calcifications.  Pericardium: Normal.  Coronary arteries: Normal origin.  IMPRESSION: Coronary calcium score of 947. This was 95 percentile for age and sex matched control.  EKG:  EKG is  ordered today.  The ekg ordered today demonstrates atrial fibrillation with  HR 94  Recent Labs: No results found for requested labs within last 8760 hours.  Recent Lipid Panel    Component Value Date/Time   CHOL 103 10/16/2019 0830   TRIG 65 10/16/2019 0830   HDL 46 10/16/2019 0830   CHOLHDL 2.2 10/16/2019 0830   CHOLHDL 4 08/20/2019 1540   VLDL 16.2 08/20/2019 1540   LDLCALC 43 10/16/2019 0830   LDLDIRECT 96.1 09/02/2013 0839     Risk Assessment/Calculations:    CHA2DS2-VASc Score = 1  {This indicates a 0.6% annual risk of stroke. The patient's score is based upon:       Physical Exam:    VS:  BP 126/78   Pulse 94   Ht 6\' 2"  (1.88 m)   Wt 237 lb 3.2 oz (107.6 kg)   SpO2 96%   BMI 30.45 kg/m     Wt Readings from Last 3 Encounters:  12/28/20 237 lb 3.2 oz (107.6 kg)  12/16/19 226 lb 6.4 oz (102.7 kg)  12/02/19 224 lb (101.6 kg)     GEN:  Well nourished, well developed in no acute distress HEENT: Normal NECK: No JVD; No carotid bruits CARDIAC: Irregularly irregular, no murmurs, rubs,  gallops RESPIRATORY:  Clear to auscultation without rales, wheezing or rhonchi  ABDOMEN: Soft, non-tender, non-distended MUSCULOSKELETAL:  No edema; No deformity  SKIN: Warm and dry NEUROLOGIC:  Alert and oriented x 3 PSYCHIATRIC:  Normal affect   ASSESSMENT:    1. PAF (paroxysmal atrial fibrillation) ([place])   2. Hyperlipidemia, unspecified hyperlipidemia type   3. Family history of early CAD   66. Coronary artery calcification   5. Encounter for monitoring flecainide therapy   6. Essential hypertension    PLAN:    In order of problems listed above:  #Paroxysmal afib: CHADs-vasc 1 for nonobstructive CAD. On apxiaban which he tolerated well. Notably in Afib today likely triggered by missing several doses of his flecainide. Currently asymptomatic with no HF symptoms. -Continue flecainide 100mg  BID -Continue dilt 120mg  daily with 60mg  as needed for breakthrough -Continue apixaban 5mg  BID -Check ECG in 1 month and if remains out of rhythm, can discuss options of possible repeat DCCV  #Non-obstructive CAD with coronary calcium score 947: Asymptomatic with no anginal symptoms. -Continue crestor 20mg  daily -No ASA due to need for apixaban for [facility]  #HLD: LDL very well controlled at 41 in 10/2019. -Continue crestor 20mg  daily        Medication Adjustments/Labs and Tests Ordered: Current medicines are reviewed at length with the patient today.  Concerns regarding medicines are outlined above.  Orders Placed This Encounter  Procedures  . EKG 12-Lead   Meds ordered this encounter  Medications  . rosuvastatin (CRESTOR) 20 MG tablet    Sig: Take 1 tablet (20 mg total) by mouth daily.    Dispense:  90 tablet    Refill:  3  . flecainide (TAMBOCOR) 100 MG tablet    Sig: TAKE 1 TABLET BY MOUTH 2 TIMES DAILY.    Dispense:  60 tablet    Refill:  6  . apixaban (ELIQUIS) 5 MG TABS tablet    Sig: Take 1 tablet (5 mg total) by mouth 2 (two) times daily.    Dispense:  180 tablet     Refill:  3  . diltiazem (CARDIZEM CD) 120 MG 24 hr capsule    Sig: TAKE 1 CAPSULE BY MOUTH EVERY DAY    Dispense:  90 capsule    Refill:  3  Patient Instructions  Medication Instructions:   Your physician recommends that you continue on your current medications as directed. Please refer to the Current Medication list given to you today.  *If you need a refill on your cardiac medications before your next appointment, please call your pharmacy*   Follow-Up:  -NURSE VISIT EKG TO BE DONE HERE IN THE OFFICE IN ONE MONTH TO DETERMINE IF YOU ARE IN RHYTHM OR NOT PER DR. Johney Frame   At [facility], you and your health needs are our priority.  As part of our continuing mission to provide you with exceptional heart care, we have created designated Provider Care Teams.  These Care Teams include your primary Cardiologist (physician) and Advanced Practice Providers (APPs -  Physician Assistants and Nurse Practitioners) who all work together to provide you with the care you need, when you need it.  We recommend signing up for the patient portal called "MyChart".  Sign up information is provided on this After Visit Summary.  MyChart is used to connect with patients for Virtual Visits (Telemedicine).  Patients are able to view lab/test results, encounter notes, upcoming appointments, etc.  Non-urgent messages can be sent to your provider as well.   To learn more about what you can do with MyChart, go to [email or web address].    Your next appointment:   1 year(s)  The format for your next appointment:   In Person  Provider:   Gwyndolyn Kaufman, MD        Signed, Freada Bergeron, MD  12/28/2020 12:51 PM    [place]

## 2020-12-28 ENCOUNTER — Ambulatory Visit (INDEPENDENT_AMBULATORY_CARE_PROVIDER_SITE_OTHER): Payer: BC Managed Care – PPO | Admitting: Cardiology

## 2020-12-28 ENCOUNTER — Encounter: Payer: Self-pay | Admitting: Cardiology

## 2020-12-28 ENCOUNTER — Other Ambulatory Visit: Payer: Self-pay

## 2020-12-28 VITALS — BP 126/78 | HR 94 | Ht 74.0 in | Wt 237.2 lb

## 2020-12-28 DIAGNOSIS — Z8249 Family history of ischemic heart disease and other diseases of the circulatory system: Secondary | ICD-10-CM | POA: Diagnosis not present

## 2020-12-28 DIAGNOSIS — I2510 Atherosclerotic heart disease of native coronary artery without angina pectoris: Secondary | ICD-10-CM | POA: Diagnosis not present

## 2020-12-28 DIAGNOSIS — I480 Paroxysmal atrial fibrillation: Secondary | ICD-10-CM | POA: Diagnosis not present

## 2020-12-28 DIAGNOSIS — Z79899 Other long term (current) drug therapy: Secondary | ICD-10-CM

## 2020-12-28 DIAGNOSIS — E785 Hyperlipidemia, unspecified: Secondary | ICD-10-CM

## 2020-12-28 DIAGNOSIS — Z5181 Encounter for therapeutic drug level monitoring: Secondary | ICD-10-CM

## 2020-12-28 DIAGNOSIS — I10 Essential (primary) hypertension: Secondary | ICD-10-CM

## 2020-12-28 DIAGNOSIS — I2584 Coronary atherosclerosis due to calcified coronary lesion: Secondary | ICD-10-CM

## 2020-12-28 MED ORDER — FLECAINIDE ACETATE 100 MG PO TABS: ORAL_TABLET | ORAL | 6 refills | Status: DC

## 2020-12-28 MED ORDER — ROSUVASTATIN CALCIUM 20 MG PO TABS: 20.0000 mg | ORAL_TABLET | Freq: Every day | ORAL | 3 refills | Status: DC

## 2020-12-28 MED ORDER — ELIQUIS 5 MG PO TABS: 1.0000 | ORAL_TABLET | Freq: Two times a day (BID) | ORAL | 3 refills | Status: DC

## 2020-12-28 MED ORDER — DILTIAZEM HCL ER COATED BEADS 120 MG PO CP24: ORAL_CAPSULE | ORAL | 3 refills | Status: DC

## 2020-12-28 NOTE — Patient Instructions (Signed)
Medication Instructions:   Your physician recommends that you continue on your current medications as directed. Please refer to the Current Medication list given to you today.  *If you need a refill on your cardiac medications before your next appointment, please call your pharmacy*   Follow-Up:  -NURSE VISIT EKG TO BE DONE HERE IN THE OFFICE IN ONE MONTH TO DETERMINE IF YOU ARE IN RHYTHM OR NOT PER DR. Johney Frame   At [facility], you and your health needs are our priority.  As part of our continuing mission to provide you with exceptional heart care, we have created designated Provider Care Teams.  These Care Teams include your primary Cardiologist (physician) and Advanced Practice Providers (APPs -  Physician Assistants and Nurse Practitioners) who all work together to provide you with the care you need, when you need it.  We recommend signing up for the patient portal called "MyChart".  Sign up information is provided on this After Visit Summary.  MyChart is used to connect with patients for Virtual Visits (Telemedicine).  Patients are able to view lab/test results, encounter notes, upcoming appointments, etc.  Non-urgent messages can be sent to your provider as well.   To learn more about what you can do with MyChart, go to [email or web address].    Your next appointment:   1 year(s)  The format for your next appointment:   In Person  Provider:   Gwyndolyn Kaufman, MD

## 2021-02-01 ENCOUNTER — Ambulatory Visit (INDEPENDENT_AMBULATORY_CARE_PROVIDER_SITE_OTHER): Payer: BC Managed Care – PPO

## 2021-02-01 ENCOUNTER — Other Ambulatory Visit: Payer: Self-pay | Admitting: *Deleted

## 2021-02-01 ENCOUNTER — Other Ambulatory Visit: Payer: Self-pay

## 2021-02-01 VITALS — HR 105 | Ht 74.0 in

## 2021-02-01 DIAGNOSIS — I480 Paroxysmal atrial fibrillation: Secondary | ICD-10-CM

## 2021-02-01 NOTE — Progress Notes (Signed)
Reason for visit: 1 month follow up to determine if Pt continues to be in afib  Name of MD requesting visit: Dr. Johney Frame  H&P: Pt with history of paroxysmal atrial fibrillation.  Was in afib when Pt last seen by Dr. Johney Frame Dec 28, 2020.  ROS related to problem: Pt states he thinks he is in afib today  Assessment and plan per MD: DOD reviewed EKG.  Pt in afib with RVR.  Per last office visit with Dr. Johney Frame, if Pt continues in afib at this nurse visit schedule DCCV.  Offered to schedule DCCV for Pt today.  He refuses, states he has some time off in July but he does not know when that is.  He will  message Dr. Jacolyn Reedy nurse to give her potential cardioversion dates.

## 2021-02-25 NOTE — Progress Notes (Signed)
Cardiology Office Note:    Date:  02/27/2021   ID:  Jorge Kelly, DOB 10/15/1955, MRN [id number]  PCP:  Jorge Olp, MD   [facility] Providers Cardiologist:  Jorge Dawley, MD (Inactive)     Referring MD: Jorge Olp, MD    History of Present Illness:    Jorge Kelly is a 65 y.o. male with a hx of pAfib and GERD who was previously followed by Dr. Meda Kelly who now presents to clinic for follow-up of his pAfib.  Patient has a long-standing history of atrial fibrillation for which he was started on flecainide. During last visit on 12/28/20 he was in Afib after running out of his medications for several days. We recommended repeat ECG to assess if out of rhythm after resuming flec, however, he is just now following up.  Today, the patient states that he states he feels overall okay. Currently in Afib with HR 100s, but states he feels like he has been flipping in and out of rhythm since our last visit. When he is in Afib, he is symptomatic with palpitations, SOB and weakness. He is concerned that he is having more Afib due to weight gain. Denies any lightheadedness, dizziness, syncope, orthopnea, PND or LE edema. Does not believe he has OSA and declined sleep study. Has been compliant with all medications and tolerating apixaban without bleeding issues.   Of note, we discussed the option of being seen by EP for consideration of possibly ablation but the patient would like to see if his symptoms improve with weight loss at this time.   Past Medical History:  Diagnosis Date   Actinic keratosis 10/24/2009   ALLERGIC RHINITIS 05/11/2007   ELEVATED BLOOD PRESSURE WITHOUT DIAGNOSIS OF HYPERTENSION 11/12/2007   Dr. Debara Kelly mentioned HTN on problem list   Esophageal stricture    treated with protonix   GERD 05/11/2007   GI bleeding    20 years ago   Kidney stone    Melanoma ([place]) 01/2014   remoted from forehead   MIGRAINE HEADACHE 05/11/2007   Overweight    Persistent atrial  fibrillation ([place]) 05/2014   Snores     Past Surgical History:  Procedure Laterality Date   ANKLE FRACTURE SURGERY  2010   x2-left   CARDIOVERSION N/A 06/23/2014   CARDIOVERSION N/A 09/09/2014   malignant melanoma     removed from forehead in June 2015   STRABISMUS SURGERY Right 05/21/2014   Dr. Annamaria Boots   UPPER GI ENDOSCOPY     stricture, just PPI    Current Medications: Current Meds  Medication Sig   apixaban (ELIQUIS) 5 MG TABS tablet Take 1 tablet (5 mg total) by mouth 2 (two) times daily.   diltiazem (CARDIZEM CD) 120 MG 24 hr capsule TAKE 1 CAPSULE BY MOUTH EVERY DAY   diltiazem (CARDIZEM) 60 MG tablet Take 1 tablet (60 mg total) by mouth daily as needed. For palpitations.   flecainide (TAMBOCOR) 100 MG tablet TAKE 1 TABLET BY MOUTH 2 TIMES DAILY.   oxyCODONE-acetaminophen (PERCOCET/ROXICET) 5-325 MG tablet Take 1-2 tablets by mouth every 4 (four) hours as needed for severe pain.   rizatriptan (MAXALT-MLT) 10 MG disintegrating tablet Take daily as needed for migraine May repeat in 2 hours if needed x1   rosuvastatin (CRESTOR) 20 MG tablet Take 1 tablet (20 mg total) by mouth daily.     Allergies:   Patient has no known allergies.   Social History   Socioeconomic History   Marital  status: Married    Spouse name: Not on file   Number of children: Not on file   Years of education: Not on file   Highest education level: Not on file  Occupational History   Occupation: Attorney  Tobacco Use   Smoking status: Never   Smokeless tobacco: Never  Vaping Use   Vaping Use: Never used  Substance and Sexual Activity   Alcohol use: No    Alcohol/week: 0.0 standard drinks   Drug use: No   Sexual activity: Not on file  Other Topics Concern   Not on file  Social History Narrative   Lives in [place] with wife (wife patient at [facility]).  2 sons.- 71 Logan at [employer] and Novi at [employer] in [place]  Wife with pregnancy at 69 and 2037-10-26.       Trial lawyer. Criminal defense       Hobbies: sails usually in [place]    Social Determinants of Health   Financial Resource Strain: Not on file  Food Insecurity: Not on file  Transportation Needs: Not on file  Physical Activity: Not on file  Stress: Not on file  Social Connections: Not on file     Family History: The patient's family history includes Atrial fibrillation in his mother; Cancer in his sister; Diabetes type II in his sister; Heart disease in his maternal uncle; Leukemia in his father; Lymphoma in his mother.  ROS:   Please see the history of present illness.    Review of Systems  Constitutional:  Positive for malaise/fatigue. Negative for chills and fever.  HENT:  Negative for sore throat.   Eyes:  Negative for blurred vision.  Respiratory:  Positive for shortness of breath.   Cardiovascular:  Positive for palpitations. Negative for chest pain, orthopnea, claudication, leg swelling and PND.  Gastrointestinal:  Negative for blood in stool, melena, nausea and vomiting.  Genitourinary:  Negative for hematuria.  Musculoskeletal:  Negative for falls.  Neurological:  Negative for dizziness and loss of consciousness.  Psychiatric/Behavioral:  Negative for substance abuse.    EKGs/Labs/Other Studies Reviewed:    The following studies were reviewed today: TTE 26-Oct-2013: Study Conclusions   - Left ventricle: The cavity size was normal. There was mild    concentric hypertrophy. Systolic function was normal. The    estimated ejection fraction was in the range of 50% to 55%. Wall    motion was normal; there were no regional wall motion    abnormalities. The study was not technically sufficient to allow    evaluation of LV diastolic dysfunction due to atrial    fibrillation.  - Aortic valve: Structurally normal valve. There was mild    regurgitation.  - Ascending aorta: The ascending aorta was normal in size.  - Mitral valve: Structurally normal valve. There was mild    regurgitation.  - Left  atrium: The atrium was moderately dilated.  - Right ventricle: The cavity size was mildly dilated. Wall    thickness was normal. Systolic function was normal.  - Right atrium: The atrium was mildly dilated.  - Tricuspid valve: There was mild regurgitation.  - Pulmonic valve: There was no regurgitation.  - Pulmonary arteries: Systolic pressure was within the normal    range.  - Inferior vena cava: The vessel was dilated. The respirophasic    diameter changes were blunted (< 50%), consistent with elevated    central venous pressure.  - Pericardium, extracardiac: The pericardium was normal in  appearance.  Myoview 2015: Normal stress with no infarct or ischemia  Coronary Calcium Score 09/2019: FINDINGS: Non-cardiac: See separate report from [facility].   Ascending Aorta: Normal size, minimal calcifications.   Pericardium: Normal.   Coronary arteries: Normal origin.   IMPRESSION: Coronary calcium score of 947. This was 95 percentile for age and sex matched control.  EKG:  EKG is  ordered today.  The ekg ordered today demonstrates atrial fibrillation with HR 94  Recent Labs: No results found for requested labs within last 8760 hours.  Recent Lipid Panel    Component Value Date/Time   CHOL 103 10/16/2019 0830   TRIG 65 10/16/2019 0830   HDL 46 10/16/2019 0830   CHOLHDL 2.2 10/16/2019 0830   CHOLHDL 4 08/20/2019 1540   VLDL 16.2 08/20/2019 1540   LDLCALC 43 10/16/2019 0830   LDLDIRECT 96.1 09/02/2013 0839     Risk Assessment/Calculations:    CHA2DS2-VASc Score = 1  {This indicates a 0.6% annual risk of stroke. The patient's score is based upon:       Physical Exam:    VS:  BP 120/80 (BP Location: Left Arm, Patient Position: Sitting, Cuff Size: Normal)   Pulse 100   Ht '6\' 2"'$  (1.88 m)   Wt 241 lb (109.3 kg)   SpO2 93%   BMI 30.94 kg/m     Wt Readings from Last 3 Encounters:  02/27/21 241 lb (109.3 kg)  12/28/20 237 lb 3.2 oz (107.6 kg)   12/16/19 226 lb 6.4 oz (102.7 kg)     GEN:  Comfortable, NAD HEENT: Normal NECK: No JVD; No carotid bruits CARDIAC: Irregularly irregular, no murmurs, rubs, gallops RESPIRATORY:  Clear to auscultation without rales, wheezing or rhonchi  ABDOMEN: Soft, non-tender, non-distended MUSCULOSKELETAL:  No edema; No deformity  SKIN: Warm and dry NEUROLOGIC:  Alert and oriented x 3 PSYCHIATRIC:  Normal affect   ASSESSMENT:    1. PAF (paroxysmal atrial fibrillation) (HCC)    PLAN:    In order of problems listed above:  #Paroxysmal afib: CHADs-vasc 1 for nonobstructive CAD. On apxiaban which he tolerated well. Remains in Afib today but feels like he is flipping in and out at home. Will check monitor to assess burden of Afib to determine if any utility for DCCV. Discussed option of ablation as well but he declined at this time. Also counseled that if he felt he had OSA with weight gain, we could repeat a sleep study but he declined at this time.  -Check 3 day zio monitor to assess burden of Afib and can consider DCCV if remains in continuous Afib -Declining EP referral at this time and does not want to pursue ablation; will repeat TTE to reassess LVEF and LA size to determine if ablation an option in the future -Continue flecainide '100mg'$  BID; can consider other AADs in the future if needed for rhythm control -Continue dilt '120mg'$  daily with '60mg'$  as needed for breakthrough -Continue apixaban '5mg'$  BID  #Non-obstructive CAD with coronary calcium score 947: Asymptomatic with no anginal symptoms. -Continue crestor '20mg'$  daily -No ASA due to need for apixaban for [facility]  #HLD: LDL very well controlled at 41 in 10/2019. -Continue crestor '20mg'$  daily   Medication Adjustments/Labs and Tests Ordered: Current medicines are reviewed at length with the patient today.  Concerns regarding medicines are outlined above.  Orders Placed This Encounter  Procedures   LONG TERM MONITOR (3-14 DAYS)   EKG 12-Lead    ECHOCARDIOGRAM COMPLETE   No orders  of the defined types were placed in this encounter.   Patient Instructions  Medication Instructions:   Your physician recommends that you continue on your current medications as directed. Please refer to the Current Medication list given to you today.  *If you need a refill on your cardiac medications before your next appointment, please call your pharmacy*  Testing/Procedures:  Your physician has requested that you have an echocardiogram. Echocardiography is a painless test that uses sound waves to create images of your heart. It provides your doctor with information about the size and shape of your heart and how well your heart's chambers and valves are working. This procedure takes approximately one hour. There are no restrictions for this procedure.  ZIO XT- Long Term Monitor Instructions  Your physician has requested you wear a ZIO patch monitor for 3 days.  This is a single patch monitor. [employer] supplies one patch monitor per enrollment. Additional stickers are not available. Please do not apply patch if you will be having a Nuclear Stress Test,  Echocardiogram, Cardiac CT, MRI, or Chest Xray during the period you would be wearing the  monitor. The patch cannot be worn during these tests. You cannot remove and re-apply the  ZIO XT patch monitor.  Your ZIO patch monitor will be mailed 3 day USPS to your address on file. It may take 3-5 days  to receive your monitor after you have been enrolled.  Once you have received your monitor, please review the enclosed instructions. Your monitor  has already been registered assigning a specific monitor serial # to you.  Billing and Patient Assistance Program Information  We have supplied [employer] with any of your insurance information on file for billing purposes. [employer] offers a sliding scale Patient Assistance Program for patients that do not have  insurance, or whose insurance does not completely  cover the cost of the ZIO monitor.  You must apply for the Patient Assistance Program to qualify for this discounted rate.  To apply, please call [employer] at [phone], select option 4, select option 2, ask to apply for  Patient Assistance Program. Theodore Demark will ask your household income, and how many people  are in your household. They will quote your out-of-pocket cost based on that information.  [employer] will also be able to set up a 15-month interest-free payment plan if needed.  Applying the monitor   Shave hair from upper left chest.  Hold abrader disc by orange tab. Rub abrader in 40 strokes over the upper left chest as  indicated in your monitor instructions.  Clean area with 4 enclosed alcohol pads. Let dry.  Apply patch as indicated in monitor instructions. Patch will be placed under collarbone on left  side of chest with arrow pointing upward.  Rub patch adhesive wings for 2 minutes. Remove white label marked "1". Remove the white  label marked "2". Rub patch adhesive wings for 2 additional minutes.  While looking in a mirror, press and release button in center of patch. A small green light will  flash 3-4 times. This will be your only indicator that the monitor has been turned on.  Do not shower for the first 24 hours. You may shower after the first 24 hours.  Press the button if you feel a symptom. You will hear a small click. Record Date, Time and  Symptom in the Patient Logbook.  When you are ready to remove the patch, follow instructions on the last 2 pages of Patient  Logbook. Stick  patch monitor onto the last page of Patient Logbook.  Place Patient Logbook in the blue and white box. Use locking tab on box and tape box closed  securely. The blue and white box has prepaid postage on it. Please place it in the mailbox as  soon as possible. Your physician should have your test results approximately 7 days after the  monitor has been mailed back to [facility].  Call [place] at [phone] if you have questions regarding  your ZIO XT patch monitor. Call them immediately if you see an orange light blinking on your  monitor.  If your monitor falls off in less than 4 days, contact our Monitor department at [phone].  If your monitor becomes loose or falls off after 4 days call [employer] at [phone] for  suggestions on securing your monitor   Follow-Up: At [facility], you and your health needs are our priority.  As part of our continuing mission to provide you with exceptional heart care, we have created designated Provider Care Teams.  These Care Teams include your primary Cardiologist (physician) and Advanced Practice Providers (APPs -  Physician Assistants and Nurse Practitioners) who all work together to provide you with the care you need, when you need it.  We recommend signing up for the patient portal called "MyChart".  Sign up information is provided on this After Visit Summary.  MyChart is used to connect with patients for Virtual Visits (Telemedicine).  Patients are able to view lab/test results, encounter notes, upcoming appointments, etc.  Non-urgent messages can be sent to your provider as well.   To learn more about what you can do with MyChart, go to [email or web address].    Your next appointment:   6 month(s)  The format for your next appointment:   In Person  Provider:   Gwyndolyn Kaufman, MD     Signed, Freada Bergeron, MD  02/27/2021 9:18 AM    [place]

## 2021-02-27 ENCOUNTER — Ambulatory Visit (INDEPENDENT_AMBULATORY_CARE_PROVIDER_SITE_OTHER): Payer: BC Managed Care – PPO

## 2021-02-27 ENCOUNTER — Other Ambulatory Visit: Payer: Self-pay

## 2021-02-27 ENCOUNTER — Telehealth: Payer: Self-pay | Admitting: *Deleted

## 2021-02-27 ENCOUNTER — Ambulatory Visit (INDEPENDENT_AMBULATORY_CARE_PROVIDER_SITE_OTHER): Payer: BC Managed Care – PPO | Admitting: Cardiology

## 2021-02-27 ENCOUNTER — Encounter: Payer: Self-pay | Admitting: Cardiology

## 2021-02-27 VITALS — BP 120/80 | HR 100 | Ht 74.0 in | Wt 241.0 lb

## 2021-02-27 DIAGNOSIS — I480 Paroxysmal atrial fibrillation: Secondary | ICD-10-CM

## 2021-02-27 DIAGNOSIS — I2584 Coronary atherosclerosis due to calcified coronary lesion: Secondary | ICD-10-CM

## 2021-02-27 DIAGNOSIS — E785 Hyperlipidemia, unspecified: Secondary | ICD-10-CM

## 2021-02-27 DIAGNOSIS — I10 Essential (primary) hypertension: Secondary | ICD-10-CM

## 2021-02-27 DIAGNOSIS — I2510 Atherosclerotic heart disease of native coronary artery without angina pectoris: Secondary | ICD-10-CM | POA: Diagnosis not present

## 2021-02-27 NOTE — Patient Instructions (Signed)
Medication Instructions:   Your physician recommends that you continue on your current medications as directed. Please refer to the Current Medication list given to you today.  *If you need a refill on your cardiac medications before your next appointment, please call your pharmacy*  Testing/Procedures:  Your physician has requested that you have an echocardiogram. Echocardiography is a painless test that uses sound waves to create images of your heart. It provides your doctor with information about the size and shape of your heart and how well your heart's chambers and valves are working. This procedure takes approximately one hour. There are no restrictions for this procedure.  ZIO XT- Long Term Monitor Instructions  Your physician has requested you wear a ZIO patch monitor for 3 days.  This is a single patch monitor. [employer] supplies one patch monitor per enrollment. Additional stickers are not available. Please do not apply patch if you will be having a Nuclear Stress Test,  Echocardiogram, Cardiac CT, MRI, or Chest Xray during the period you would be wearing the  monitor. The patch cannot be worn during these tests. You cannot remove and re-apply the  ZIO XT patch monitor.  Your ZIO patch monitor will be mailed 3 day USPS to your address on file. It may take 3-5 days  to receive your monitor after you have been enrolled.  Once you have received your monitor, please review the enclosed instructions. Your monitor  has already been registered assigning a specific monitor serial # to you.  Billing and Patient Assistance Program Information  We have supplied [employer] with any of your insurance information on file for billing purposes. [employer] offers a sliding scale Patient Assistance Program for patients that do not have  insurance, or whose insurance does not completely cover the cost of the ZIO monitor.  You must apply for the Patient Assistance Program to qualify for this discounted  rate.  To apply, please call [employer] at [phone], select option 4, select option 2, ask to apply for  Patient Assistance Program. Theodore Demark will ask your household income, and how many people  are in your household. They will quote your out-of-pocket cost based on that information.  [employer] will also be able to set up a 48-month interest-free payment plan if needed.  Applying the monitor   Shave hair from upper left chest.  Hold abrader disc by orange tab. Rub abrader in 40 strokes over the upper left chest as  indicated in your monitor instructions.  Clean area with 4 enclosed alcohol pads. Let dry.  Apply patch as indicated in monitor instructions. Patch will be placed under collarbone on left  side of chest with arrow pointing upward.  Rub patch adhesive wings for 2 minutes. Remove white label marked "1". Remove the white  label marked "2". Rub patch adhesive wings for 2 additional minutes.  While looking in a mirror, press and release button in center of patch. A small green light will  flash 3-4 times. This will be your only indicator that the monitor has been turned on.  Do not shower for the first 24 hours. You may shower after the first 24 hours.  Press the button if you feel a symptom. You will hear a small click. Record Date, Time and  Symptom in the Patient Logbook.  When you are ready to remove the patch, follow instructions on the last 2 pages of Patient  Logbook. Stick patch monitor onto the last page of Patient Logbook.  Place Patient Logbook in  the blue and white box. Use locking tab on box and tape box closed  securely. The blue and white box has prepaid postage on it. Please place it in the mailbox as  soon as possible. Your physician should have your test results approximately 7 days after the  monitor has been mailed back to [facility].  Call [place] at [phone] if you have questions regarding  your ZIO XT patch monitor. Call them  immediately if you see an orange light blinking on your  monitor.  If your monitor falls off in less than 4 days, contact our Monitor department at [phone].  If your monitor becomes loose or falls off after 4 days call [employer] at [phone] for  suggestions on securing your monitor   Follow-Up: At [facility], you and your health needs are our priority.  As part of our continuing mission to provide you with exceptional heart care, we have created designated Provider Care Teams.  These Care Teams include your primary Cardiologist (physician) and Advanced Practice Providers (APPs -  Physician Assistants and Nurse Practitioners) who all work together to provide you with the care you need, when you need it.  We recommend signing up for the patient portal called "MyChart".  Sign up information is provided on this After Visit Summary.  MyChart is used to connect with patients for Virtual Visits (Telemedicine).  Patients are able to view lab/test results, encounter notes, upcoming appointments, etc.  Non-urgent messages can be sent to your provider as well.   To learn more about what you can do with MyChart, go to [email or web address].    Your next appointment:   6 month(s)  The format for your next appointment:   In Person  Provider:   Gwyndolyn Kaufman, MD

## 2021-02-27 NOTE — Telephone Encounter (Signed)
-----   Message from Jennefer Bravo sent at 02/27/2021  9:48 AM EDT ----- Regarding: RE: 3 day zio per pemberton done ----- Message ----- From: Nuala Alpha, LPN Sent: [id number]   9:13 AM EDT To: Jennefer Bravo, Katrina Theda [place] Subject: 3 day zio per pemberton                        Dr. Johney Frame saw this pt in clinic this morning and ordered for him to have a 3 day zio to see his afib burden. Please enroll and let me know when you do?  Thanks, [employer]

## 2021-02-27 NOTE — Progress Notes (Unsigned)
Patient enrolled for [employer] to mail a 3 day ZIO XT monitor to address on file.

## 2021-03-07 DIAGNOSIS — I480 Paroxysmal atrial fibrillation: Secondary | ICD-10-CM | POA: Diagnosis not present

## 2021-03-14 DIAGNOSIS — I480 Paroxysmal atrial fibrillation: Secondary | ICD-10-CM | POA: Diagnosis not present

## 2021-03-28 ENCOUNTER — Ambulatory Visit (HOSPITAL_COMMUNITY): Payer: BC Managed Care – PPO | Attending: Cardiology

## 2021-03-28 ENCOUNTER — Other Ambulatory Visit: Payer: Self-pay

## 2021-03-28 DIAGNOSIS — I480 Paroxysmal atrial fibrillation: Secondary | ICD-10-CM | POA: Insufficient documentation

## 2021-03-28 LAB — ECHOCARDIOGRAM COMPLETE
Area-P 1/2: 2.9 cm2
P 1/2 time: 550 msec
S' Lateral: 3.25 cm

## 2021-03-29 ENCOUNTER — Telehealth: Payer: Self-pay | Admitting: *Deleted

## 2021-03-29 DIAGNOSIS — I77819 Aortic ectasia, unspecified site: Secondary | ICD-10-CM

## 2021-03-29 NOTE — Telephone Encounter (Signed)
Pt made aware of echo results and recommendations per Dr. Radford Pax (covering for Dr. Johney Frame).  Pt aware that I will go ahead and place the order for the repeat echo in one year in the system and send a message to our Echo Scheduler to call him back and arrange this closer to that time.  Pt verbalized understanding and agrees with this plan.  Pt did state that being his echo is back and he is still in afib and is feeling symptomatic  with complaints of mostly feeling tired and fatigued, he would like to go forth in scheduling an appt with our office to be seen and to arrange a DCCV.  Pt states that he thought he would convert from the time he had his monitor results on 8/9 and his echo from today, but he states he mostly still feels like he's still in afib.  Pt will need to be seen for update in H&P, ekg, and labs before arranging his DCCV, per protocol.  He is aware of this.  Endorsed to the pt that I am going to reach out to our afib clinic to see if they could assist in getting him an appt to be seen for complaints and to arrange DCCV.  Informed the pt that they will touch base with me tomorrow and I will call him back to endorse his appt date and time.  Pt verbalized understanding and agrees with this plan.

## 2021-03-29 NOTE — Telephone Encounter (Signed)
Jorge Margarita, MD  03/28/2021  6:47 PM EDT Back to Top    2D echo with normal LVF, severely enlarged LA and moderately enlarged RA, mild leakiness of AV and dilated ascending aorta at 58m - repeat echo in 1 year for dilated ascending aorta

## 2021-03-30 ENCOUNTER — Encounter: Payer: Self-pay | Admitting: *Deleted

## 2021-03-30 NOTE — Telephone Encounter (Signed)
Patient states he was supposed to have an appointment today, but he is tied up at court and cannot make it.

## 2021-03-30 NOTE — Telephone Encounter (Signed)
Pt contacted me back and is scheduled to see our afib clinic on next Tuesday 8/30 at 2 pm, to see Adline Peals PA-C.  He is aware of location of afib clinic and parking code to get in, via [employer].

## 2021-03-30 NOTE — Telephone Encounter (Signed)
Left the pt a message to call the office back to confirm which dates afib clinic provided me, where he can be seen next week.  Per afib clinic Adline Peals PA-C can see him next Tuesday 8/30 at 2 pm or on next Wednesday 8/31 at 9 am.  Parking code for August is [id number].   Left him a detailed message to call the office back asap to confirm which date works best for him.  I will also send him a mychart message.

## 2021-04-04 ENCOUNTER — Encounter (HOSPITAL_COMMUNITY): Payer: Self-pay | Admitting: Physician Assistant

## 2021-04-04 ENCOUNTER — Ambulatory Visit (HOSPITAL_COMMUNITY)
Admission: RE | Admit: 2021-04-04 | Discharge: 2021-04-04 | Disposition: A | Discharge status: Home / Self Care | Payer: BC Managed Care – PPO | Source: Ambulatory Visit | Attending: Physician Assistant | Admitting: Physician Assistant

## 2021-04-04 ENCOUNTER — Other Ambulatory Visit: Payer: Self-pay

## 2021-04-04 VITALS — BP 132/98 | HR 93 | Ht 74.0 in | Wt 243.8 lb

## 2021-04-04 DIAGNOSIS — Z6831 Body mass index (BMI) 31.0-31.9, adult: Secondary | ICD-10-CM | POA: Diagnosis not present

## 2021-04-04 DIAGNOSIS — I4819 Other persistent atrial fibrillation: Secondary | ICD-10-CM

## 2021-04-04 DIAGNOSIS — I10 Essential (primary) hypertension: Secondary | ICD-10-CM | POA: Diagnosis not present

## 2021-04-04 DIAGNOSIS — Z79899 Other long term (current) drug therapy: Secondary | ICD-10-CM | POA: Insufficient documentation

## 2021-04-04 DIAGNOSIS — Z8249 Family history of ischemic heart disease and other diseases of the circulatory system: Secondary | ICD-10-CM | POA: Insufficient documentation

## 2021-04-04 DIAGNOSIS — E669 Obesity, unspecified: Secondary | ICD-10-CM | POA: Insufficient documentation

## 2021-04-04 DIAGNOSIS — I2510 Atherosclerotic heart disease of native coronary artery without angina pectoris: Secondary | ICD-10-CM | POA: Insufficient documentation

## 2021-04-04 DIAGNOSIS — Z7901 Long term (current) use of anticoagulants: Secondary | ICD-10-CM | POA: Diagnosis not present

## 2021-04-04 LAB — CBC
HCT: 45.9 % (ref 39.0–52.0)
Hemoglobin: 15.9 g/dL (ref 13.0–17.0)
MCH: 32.4 pg (ref 26.0–34.0)
MCHC: 34.6 g/dL (ref 30.0–36.0)
MCV: 93.7 fL (ref 80.0–100.0)
Platelets: 152 10*3/uL (ref 150–400)
RBC: 4.9 MIL/uL (ref 4.22–5.81)
RDW: 12.8 % (ref 11.5–15.5)
WBC: 6.6 10*3/uL (ref 4.0–10.5)
nRBC: 0 % (ref 0.0–0.2)

## 2021-04-04 LAB — BASIC METABOLIC PANEL
Anion gap: 8 (ref 5–15)
BUN: 20 mg/dL (ref 8–23)
CO2: 20 mmol/L — ABNORMAL LOW (ref 22–32)
Calcium: 8.9 mg/dL (ref 8.9–10.3)
Chloride: 109 mmol/L (ref 98–111)
Creatinine, Ser: 0.85 mg/dL (ref 0.61–1.24)
GFR, Estimated: >60 mL/min (ref >60)
Glucose, Bld: 132 mg/dL — ABNORMAL HIGH (ref 70–99)
Potassium: 3.8 mmol/L (ref 3.5–5.1)
Sodium: 137 mmol/L (ref 135–145)

## 2021-04-04 NOTE — Patient Instructions (Signed)
Cardioversion scheduled for  - Arrive at the [facility] and go to admitting at  - Do not eat or drink anything after midnight the night prior to your procedure.  - Take all your morning medication (except diabetic medications) with a sip of water prior to arrival.  - You will not be able to drive home after your procedure.  - Do NOT miss any doses of your blood thinner - if you should miss a dose please notify our office immediately.  - If you feel as if you go back into normal rhythm prior to scheduled cardioversion, please notify our office immediately. If your procedure is canceled in the cardioversion suite you will be charged a cancellation fee. Patients will be asked to: to mask in public and hand hygiene (no longer quarantine) in the 3 days prior to surgery, to report if any COVID-19-like illness or household contacts to COVID-19 to determine need for testing

## 2021-04-04 NOTE — H&P (View-Only) (Signed)
Primary Care Physician: [name], MD Primary Cardiologist: Dr Johney Frame  Primary Electrophysiologist: Dr [name] (remotely)  Referring Physician: [facility] T[facility]    Jorge Kelly is a 65 y.o. male with a history of HTN and atrial fibrillation who presents for consultation in the [facility].  The patient was initially diagnosed with atrial fibrillation in 2015. He has been maintained on flecainide. Patient is on Eliquis for a CHADS2VASC score of 1. Patient had done well with little afib until 12/2020 when he was noted to be in afib at his follow up. Patient admits he ran out of his medication. He resumed the medication and a heart monitor was placed to evaluate if he was persistent vs paroxysmal. The monitor showed 100 % afib burden. He is symptomatic with fatigue, palpitations, and dizziness.   Today, he denies symptoms of chest pain, shortness of breath, orthopnea, PND, lower extremity edema, presyncope, syncope, snoring, daytime somnolence, bleeding, or neurologic sequela. The patient is tolerating medications without difficulties and is otherwise without complaint today.    Atrial Fibrillation Risk Factors:  he does not have symptoms or diagnosis of sleep apnea. he does not have a history of rheumatic fever. he does not have a history of alcohol use. The patient does not have a history of early familial atrial fibrillation or other arrhythmias.  he has a BMI of Body mass index is 31.3 kg/m.Marland Kitchen Filed Weights   04/04/21 1340  Weight: 110.6 kg    Family History  Problem Relation Age of Onset   Lymphoma Mother    Atrial fibrillation Mother        pacemaker in 19s   Leukemia Father        passed from [place] Sister        treated, cancer free   Diabetes type II Sister    Heart disease Maternal Uncle        mother lost 6 brothers to heart disease in 78s     Atrial Fibrillation Management history:  Previous antiarrhythmic drugs:  Multaq, flecainide  Previous cardioversions: 2015, 2016 Previous ablations: none CHADS2VASC score: 1 Anticoagulation history: Eliquis   Past Medical History:  Diagnosis Date   Actinic keratosis 10/24/2009   ALLERGIC RHINITIS 05/11/2007   ELEVATED BLOOD PRESSURE WITHOUT DIAGNOSIS OF HYPERTENSION 11/12/2007   Dr. Debara Pickett mentioned HTN on problem list   Esophageal stricture    treated with protonix   GERD 05/11/2007   GI bleeding    20 years ago   Kidney stone    Melanoma ([place]) 01/2014   remoted from forehead   MIGRAINE HEADACHE 05/11/2007   Overweight    Persistent atrial fibrillation ([place]) 05/2014   Snores    Past Surgical History:  Procedure Laterality Date   ANKLE FRACTURE SURGERY  2010   x2-left   CARDIOVERSION N/A 06/23/2014   CARDIOVERSION N/A 09/09/2014   malignant melanoma     removed from forehead in June 2015   STRABISMUS SURGERY Right 05/21/2014   Dr. Annamaria Boots   UPPER GI ENDOSCOPY     stricture, just PPI    Current Outpatient Medications  Medication Sig Dispense Refill   apixaban (ELIQUIS) 5 MG TABS tablet Take 1 tablet (5 mg total) by mouth 2 (two) times daily. 180 tablet 3   diltiazem (CARDIZEM CD) 120 MG 24 hr capsule TAKE 1 CAPSULE BY MOUTH EVERY DAY 90 capsule 3   diltiazem (CARDIZEM) 60 MG tablet Take 1 tablet (60 mg total)  by mouth daily as needed. For palpitations. 30 tablet 8   flecainide (TAMBOCOR) 100 MG tablet TAKE 1 TABLET BY MOUTH 2 TIMES DAILY. 60 tablet 6   oxyCODONE-acetaminophen (PERCOCET/ROXICET) 5-325 MG tablet Take 1-2 tablets by mouth every 4 (four) hours as needed for severe pain. 15 tablet 0   rizatriptan (MAXALT-MLT) 10 MG disintegrating tablet Take daily as needed for migraine May repeat in 2 hours if needed x1 9 tablet 2   rosuvastatin (CRESTOR) 20 MG tablet Take 1 tablet (20 mg total) by mouth daily. 90 tablet 3   No current facility-administered medications for this encounter.    No Known Allergies  Social History   Socioeconomic  History   Marital status: Married    Spouse name: Not on file   Number of children: Not on file   Years of education: Not on file   Highest education level: Not on file  Occupational History   Occupation: Attorney  Tobacco Use   Smoking status: Never   Smokeless tobacco: Never  Vaping Use   Vaping Use: Never used  Substance and Sexual Activity   Alcohol use: No    Alcohol/week: 0.0 standard drinks   Drug use: No   Sexual activity: Not on file  Other Topics Concern   Not on file  Social History Narrative   Lives in [place] with wife (wife patient at [employer]).  2 sons.- 14 [place] at [employer] and [place] at [employer] in [place]  Wife with pregnancy at 89 and 40.       Trial lawyer. Criminal defense      Hobbies: sails usually in [place]    Social Determinants of Health   Financial Resource Strain: Not on file  Food Insecurity: Not on file  Transportation Needs: Not on file  Physical Activity: Not on file  Stress: Not on file  Social Connections: Not on file  Intimate Partner Violence: Not on file     ROS- All systems are reviewed and negative except as per the HPI above.  Physical Exam: Vitals:   04/04/21 1340  BP: (!) 132/98  Pulse: 93  Weight: 110.6 kg  Height: '6\' 2"'$  (1.88 m)    GEN- The patient is a well appearing obese male, alert and oriented x 3 today.   Head- normocephalic, atraumatic Eyes-  Sclera clear, conjunctiva pink Ears- hearing intact Oropharynx- clear Neck- supple  Lungs- Clear to ausculation bilaterally, normal work of breathing Heart- irregular rate and rhythm, no murmurs, rubs or gallops  GI- soft, NT, ND, + BS Extremities- no clubbing, cyanosis, or edema MS- no significant deformity or atrophy Skin- no rash or lesion Psych- euthymic mood, full affect Neuro- strength and sensation are intact  Wt Readings from Last 3 Encounters:  04/04/21 110.6 kg  02/27/21 109.3 kg  12/28/20 107.6 kg    EKG today demonstrates   Afib Vent. rate 93 BPM PR interval * ms QRS duration 110 ms QT/QTcB 386/479 ms  Echo 03/28/21 demonstrated   1. Left ventricular ejection fraction, by estimation, is 65 to 70%. The left ventricle has normal function. The left ventricle has no regional wall motion abnormalities. Left ventricular diastolic function could not be evaluated.   2. Right ventricular systolic function is normal. The right ventricular  size is normal.   3. Left atrial size was severely dilated.   4. Right atrial size was moderately dilated.   5. The mitral valve is grossly normal. Trivial mitral valve  regurgitation. No evidence of  mitral stenosis.   6. The aortic valve is tricuspid. Aortic valve regurgitation is mild.   7. Aortic dilatation noted. There is mild dilatation of the ascending  aorta, measuring 40 mm.   8. The inferior vena cava is normal in size with greater than 50%  respiratory variability, suggesting right atrial pressure of 3 mmHg.   Comparison(s): Prior images unable to be directly viewed, comparison made  by report only.   Conclusion(s)/Recommendation(s): Otherwise normal echocardiogram, with  minor abnormalities described in the report. Mild enlargement of ascending  aorta, previously noted as normal. Consider repeat imaging in 6-12 months.   Epic records are reviewed at length today  CHA2DS2-VASc Score = 1  The patient's score is based upon: CHF History: No HTN History: Yes Diabetes History: No Stroke History: No Vascular Disease History: No Age Score: 0 Gender Score: 0      ASSESSMENT AND PLAN: 1. Persistent Atrial Fibrillation (ICD10:  I48.19) The patient's CHA2DS2-VASc score is 1, indicating a 0.6% annual risk of stroke.   Patient in persistent, symptomatic afib. We discussed therapeutic options. Will plan for DCCV. Check bmet/cbc Continue flecainide 100 mg BID Continue diltiazem 120 mg daily with 60 mg PRN for heart racing Continue Eliquis 5 mg BID, patient denies any  missed doses in the last 3 weeks. If he has quick return of his afib, may need to consider alternate AAD (dofetilide) vs ablation, especially given his nonobstructive CAD.  2. Obesity Body mass index is 31.3 kg/m. Lifestyle modification was discussed at length including regular exercise and weight reduction.  3. CAD Calcium score 947 On statin No anginal symptoms.   Follow up in the AF clinic post DCCV.    [facility] [address] [place], [place] [address] [phone] 04/04/2021 1:47 PM

## 2021-04-04 NOTE — Progress Notes (Signed)
Primary Care Physician: [name], MD Primary Cardiologist: Dr Johney Frame  Primary Electrophysiologist: Dr [name] (remotely)  Referring Physician: [facility] Triage    Jorge Kelly is a 64 y.o. male with a history of HTN and atrial fibrillation who presents for consultation in the [facility].  The patient was initially diagnosed with atrial fibrillation in 2015. He has been maintained on flecainide. Patient is on Eliquis for a CHADS2VASC score of 1. Patient had done well with little afib until 12/2020 when he was noted to be in afib at his follow up. Patient admits he ran out of his medication. He resumed the medication and a heart monitor was placed to evaluate if he was persistent vs paroxysmal. The monitor showed 100 % afib burden. He is symptomatic with fatigue, palpitations, and dizziness.   Today, he denies symptoms of chest pain, shortness of breath, orthopnea, PND, lower extremity edema, presyncope, syncope, snoring, daytime somnolence, bleeding, or neurologic sequela. The patient is tolerating medications without difficulties and is otherwise without complaint today.    Atrial Fibrillation Risk Factors:  he does not have symptoms or diagnosis of sleep apnea. he does not have a history of rheumatic fever. he does not have a history of alcohol use. The patient does not have a history of early familial atrial fibrillation or other arrhythmias.  he has a BMI of Body mass index is 31.3 kg/m.Marland Kitchen Filed Weights   04/04/21 1340  Weight: 110.6 kg    Family History  Problem Relation Age of Onset   Lymphoma Mother    Atrial fibrillation Mother        pacemaker in 53s   Leukemia Father        passed from [place] Sister        treated, cancer free   Diabetes type II Sister    Heart disease Maternal Uncle        mother lost 6 brothers to heart disease in 31s     Atrial Fibrillation Management history:  Previous antiarrhythmic drugs:  Multaq, flecainide  Previous cardioversions: 2015, 2016 Previous ablations: none CHADS2VASC score: 1 Anticoagulation history: Eliquis   Past Medical History:  Diagnosis Date   Actinic keratosis 10/24/2009   ALLERGIC RHINITIS 05/11/2007   ELEVATED BLOOD PRESSURE WITHOUT DIAGNOSIS OF HYPERTENSION 11/12/2007   Dr. Debara Pickett mentioned HTN on problem list   Esophageal stricture    treated with protonix   GERD 05/11/2007   GI bleeding    20 years ago   Kidney stone    Melanoma ([place]) 01/2014   remoted from forehead   MIGRAINE HEADACHE 05/11/2007   Overweight    Persistent atrial fibrillation ([place]) 05/2014   Snores    Past Surgical History:  Procedure Laterality Date   ANKLE FRACTURE SURGERY  2010   x2-left   CARDIOVERSION N/A 06/23/2014   CARDIOVERSION N/A 09/09/2014   malignant melanoma     removed from forehead in June 2015   STRABISMUS SURGERY Right 05/21/2014   Dr. Annamaria Boots   UPPER GI ENDOSCOPY     stricture, just PPI    Current Outpatient Medications  Medication Sig Dispense Refill   apixaban (ELIQUIS) 5 MG TABS tablet Take 1 tablet (5 mg total) by mouth 2 (two) times daily. 180 tablet 3   diltiazem (CARDIZEM CD) 120 MG 24 hr capsule TAKE 1 CAPSULE BY MOUTH EVERY DAY 90 capsule 3   diltiazem (CARDIZEM) 60 MG tablet Take 1 tablet (60 mg total)  by mouth daily as needed. For palpitations. 30 tablet 8   flecainide (TAMBOCOR) 100 MG tablet TAKE 1 TABLET BY MOUTH 2 TIMES DAILY. 60 tablet 6   oxyCODONE-acetaminophen (PERCOCET/ROXICET) 5-325 MG tablet Take 1-2 tablets by mouth every 4 (four) hours as needed for severe pain. 15 tablet 0   rizatriptan (MAXALT-MLT) 10 MG disintegrating tablet Take daily as needed for migraine May repeat in 2 hours if needed x1 9 tablet 2   rosuvastatin (CRESTOR) 20 MG tablet Take 1 tablet (20 mg total) by mouth daily. 90 tablet 3   No current facility-administered medications for this encounter.    No Known Allergies  Social History   Socioeconomic  History   Marital status: Married    Spouse name: Not on file   Number of children: Not on file   Years of education: Not on file   Highest education level: Not on file  Occupational History   Occupation: Attorney  Tobacco Use   Smoking status: Never   Smokeless tobacco: Never  Vaping Use   Vaping Use: Never used  Substance and Sexual Activity   Alcohol use: No    Alcohol/week: 0.0 standard drinks   Drug use: No   Sexual activity: Not on file  Other Topics Concern   Not on file  Social History Narrative   Lives in [place] with wife (wife patient at [employer]).  2 sons.- 56 [place] at [employer] and [place] at [employer] in [place]  Wife with pregnancy at 28 and 39.       Trial lawyer. Criminal defense      Hobbies: sails usually in [place]    Social Determinants of Health   Financial Resource Strain: Not on file  Food Insecurity: Not on file  Transportation Needs: Not on file  Physical Activity: Not on file  Stress: Not on file  Social Connections: Not on file  Intimate Partner Violence: Not on file     ROS- All systems are reviewed and negative except as per the HPI above.  Physical Exam: Vitals:   04/04/21 1340  BP: (!) 132/98  Pulse: 93  Weight: 110.6 kg  Height: '6\' 2"'$  (1.88 m)    GEN- The patient is a well appearing obese male, alert and oriented x 3 today.   Head- normocephalic, atraumatic Eyes-  Sclera clear, conjunctiva pink Ears- hearing intact Oropharynx- clear Neck- supple  Lungs- Clear to ausculation bilaterally, normal work of breathing Heart- irregular rate and rhythm, no murmurs, rubs or gallops  GI- soft, NT, ND, + BS Extremities- no clubbing, cyanosis, or edema MS- no significant deformity or atrophy Skin- no rash or lesion Psych- euthymic mood, full affect Neuro- strength and sensation are intact  Wt Readings from Last 3 Encounters:  04/04/21 110.6 kg  02/27/21 109.3 kg  12/28/20 107.6 kg    EKG today demonstrates   Afib Vent. rate 93 BPM PR interval * ms QRS duration 110 ms QT/QTcB 386/479 ms  Echo 03/28/21 demonstrated   1. Left ventricular ejection fraction, by estimation, is 65 to 70%. The left ventricle has normal function. The left ventricle has no regional wall motion abnormalities. Left ventricular diastolic function could not be evaluated.   2. Right ventricular systolic function is normal. The right ventricular  size is normal.   3. Left atrial size was severely dilated.   4. Right atrial size was moderately dilated.   5. The mitral valve is grossly normal. Trivial mitral valve  regurgitation. No evidence of  mitral stenosis.   6. The aortic valve is tricuspid. Aortic valve regurgitation is mild.   7. Aortic dilatation noted. There is mild dilatation of the ascending  aorta, measuring 40 mm.   8. The inferior vena cava is normal in size with greater than 50%  respiratory variability, suggesting right atrial pressure of 3 mmHg.   Comparison(s): Prior images unable to be directly viewed, comparison made  by report only.   Conclusion(s)/Recommendation(s): Otherwise normal echocardiogram, with  minor abnormalities described in the report. Mild enlargement of ascending  aorta, previously noted as normal. Consider repeat imaging in 6-12 months.   Epic records are reviewed at length today  CHA2DS2-VASc Score = 1  The patient's score is based upon: CHF History: No HTN History: Yes Diabetes History: No Stroke History: No Vascular Disease History: No Age Score: 0 Gender Score: 0      ASSESSMENT AND PLAN: 1. Persistent Atrial Fibrillation (ICD10:  I48.19) The patient's CHA2DS2-VASc score is 1, indicating a 0.6% annual risk of stroke.   Patient in persistent, symptomatic afib. We discussed therapeutic options. Will plan for DCCV. Check bmet/cbc Continue flecainide 100 mg BID Continue diltiazem 120 mg daily with 60 mg PRN for heart racing Continue Eliquis 5 mg BID, patient denies any  missed doses in the last 3 weeks. If he has quick return of his afib, may need to consider alternate AAD (dofetilide) vs ablation, especially given his nonobstructive CAD.  2. Obesity Body mass index is 31.3 kg/m. Lifestyle modification was discussed at length including regular exercise and weight reduction.  3. CAD Calcium score 947 On statin No anginal symptoms.   Follow up in the AF clinic post DCCV.    [facility] [address] [place], [place] [address] [phone] 04/04/2021 1:47 PM

## 2021-04-05 ENCOUNTER — Other Ambulatory Visit (HOSPITAL_COMMUNITY): Payer: Self-pay | Admitting: *Deleted

## 2021-04-13 ENCOUNTER — Ambulatory Visit (HOSPITAL_COMMUNITY): Payer: BC Managed Care – PPO | Admitting: Anesthesiology

## 2021-04-13 ENCOUNTER — Ambulatory Visit (HOSPITAL_COMMUNITY)
Admission: RE | Admit: 2021-04-13 | Discharge: 2021-04-13 | Disposition: A | Discharge status: Home / Self Care | Payer: BC Managed Care – PPO | Attending: Cardiovascular Disease | Admitting: Cardiovascular Disease

## 2021-04-13 ENCOUNTER — Encounter (HOSPITAL_COMMUNITY)
Admission: RE | Disposition: A | Discharge status: Home / Self Care | Payer: Self-pay | Source: Home / Self Care | Attending: Cardiovascular Disease

## 2021-04-13 ENCOUNTER — Other Ambulatory Visit: Payer: Self-pay

## 2021-04-13 ENCOUNTER — Encounter (HOSPITAL_COMMUNITY): Payer: Self-pay | Admitting: Cardiovascular Disease

## 2021-04-13 DIAGNOSIS — Z79899 Other long term (current) drug therapy: Secondary | ICD-10-CM | POA: Diagnosis not present

## 2021-04-13 DIAGNOSIS — I4891 Unspecified atrial fibrillation: Secondary | ICD-10-CM

## 2021-04-13 DIAGNOSIS — I4819 Other persistent atrial fibrillation: Secondary | ICD-10-CM | POA: Insufficient documentation

## 2021-04-13 DIAGNOSIS — I480 Paroxysmal atrial fibrillation: Secondary | ICD-10-CM | POA: Diagnosis not present

## 2021-04-13 DIAGNOSIS — E669 Obesity, unspecified: Secondary | ICD-10-CM | POA: Insufficient documentation

## 2021-04-13 DIAGNOSIS — Z6831 Body mass index (BMI) 31.0-31.9, adult: Secondary | ICD-10-CM | POA: Diagnosis not present

## 2021-04-13 DIAGNOSIS — G43909 Migraine, unspecified, not intractable, without status migrainosus: Secondary | ICD-10-CM | POA: Diagnosis not present

## 2021-04-13 DIAGNOSIS — D696 Thrombocytopenia, unspecified: Secondary | ICD-10-CM | POA: Diagnosis not present

## 2021-04-13 DIAGNOSIS — I2510 Atherosclerotic heart disease of native coronary artery without angina pectoris: Secondary | ICD-10-CM | POA: Diagnosis not present

## 2021-04-13 DIAGNOSIS — E785 Hyperlipidemia, unspecified: Secondary | ICD-10-CM | POA: Diagnosis not present

## 2021-04-13 DIAGNOSIS — Z7901 Long term (current) use of anticoagulants: Secondary | ICD-10-CM | POA: Insufficient documentation

## 2021-04-13 HISTORY — PX: CARDIOVERSION: SHX1299

## 2021-04-13 SURGERY — CARDIOVERSION
Anesthesia: General

## 2021-04-13 MED ORDER — SODIUM CHLORIDE 0.9 % IV SOLN
INTRAVENOUS | Status: DC
  Administered 2021-04-13: 500 mL via INTRAVENOUS

## 2021-04-13 MED ORDER — PROPOFOL 10 MG/ML IV BOLUS
INTRAVENOUS | Status: DC | PRN
  Administered 2021-04-13: 30 mg via INTRAVENOUS
  Administered 2021-04-13: 10 mg via INTRAVENOUS
  Administered 2021-04-13: 20 mg via INTRAVENOUS
  Administered 2021-04-13: 70 mg via INTRAVENOUS

## 2021-04-13 MED ORDER — LIDOCAINE 2% (20 MG/ML) 5 ML SYRINGE
INTRAMUSCULAR | Status: DC | PRN
  Administered 2021-04-13: 100 mg via INTRAVENOUS

## 2021-04-13 NOTE — Anesthesia Postprocedure Evaluation (Signed)
Anesthesia Post Note  Patient: Jorge Kelly  Procedure(s) Performed: CARDIOVERSION     Patient location during evaluation: Endoscopy Anesthesia Type: General Level of consciousness: awake and alert Pain management: pain level controlled Vital Signs Assessment: post-procedure vital signs reviewed and stable Respiratory status: spontaneous breathing, nonlabored ventilation and respiratory function stable Cardiovascular status: blood pressure returned to baseline and stable Postop Assessment: no apparent nausea or vomiting Anesthetic complications: no   No notable events documented.  Last Vitals:  Vitals:   04/13/21 0924 04/13/21 0931  BP: 122/90 (!) 128/91  Pulse: 88 91  Resp: 17 15  Temp:    SpO2: 93% 92%    Last Pain:  Vitals:   04/13/21 0931  TempSrc:   PainSc: 0-No pain                 Lidia Collum

## 2021-04-13 NOTE — CV Procedure (Signed)
   DIRECT CURRENT CARDIOVERSION  NAME:  Jorge Kelly    MRN: [id number] DOB:  04-14-1956    ADMIT DATE: 04/13/2021  Indication:  Symptomatic atrial fibrillation   Procedure Note:  The patient signed informed consent.  They have had had therapeutic anticoagulation with eliquis greater than 3 weeks.  Anesthesia was administered by Dr. Kerin Perna.  Adequate airway was maintained throughout and vital followed per protocol.  The patient was cardioverted x 2 with 200 J. He did not convert to NSR and remain in atrial fibrillation. There were no apparent complications.  The patient had normal neuro status and respiratory status post procedure with vitals stable as recorded elsewhere.    Follow up: They will continue on current medical therapy and follow up with cardiology as scheduled.  [place] T. Audie Box, MD, [facility]  [address], [place], [place] [address] [phone]  9:10 AM

## 2021-04-13 NOTE — Discharge Instructions (Signed)
Electrical Cardioversion  Electrical cardioversion is the delivery of a jolt of electricity to restore a normal rhythm to the heart. A rhythm that is too fast or is not regular keeps the heart from pumping well. In this procedure, sticky patches or metal paddles are placed on the chest to deliver electricity to the heart from a device.  What can I expect after the procedure?  Your blood pressure, heart rate, breathing rate, and blood oxygen level will be monitored until you leave the hospital or clinic.  Your heart rhythm will be watched to make sure it does not change.  You may have some redness on the skin where the shocks were given.If this occurs, can use hydrocortisone cream or Aloe vera.  Follow these instructions at home:  Do not drive for 24 hours if you were given a sedative during your procedure.  Take over-the-counter and prescription medicines only as told by your health care provider.  Ask your health care provider how to check your pulse. Check it often.  Rest for 48 hours after the procedure or as told by your health care provider.  Avoid or limit your caffeine use as told by your health care provider.  Keep all follow-up visits as told by your health care provider. This is important.  Contact a health care provider if:  You feel like your heart is beating too quickly or your pulse is not regular.  You have a serious muscle cramp that does not go away.  Get help right away if:  You have discomfort in your chest.  You are dizzy or you feel faint.  You have trouble breathing or you are short of breath.  Your speech is slurred.  You have trouble moving an arm or leg on one side of your body.  Your fingers or toes turn cold or blue.  Summary  Electrical cardioversion is the delivery of a jolt of electricity to restore a normal rhythm to the heart.  This procedure may be done right away in an emergency or may be a scheduled procedure if the condition is not  an emergency.  Generally, this is a safe procedure.  After the procedure, check your pulse often as told by your health care provider.  This information is not intended to replace advice given to you by your health care provider. Make sure you discuss any questions you have with your health care provider. Document Revised: 02/23/2019 Document Reviewed: 02/23/2019 Elsevier Patient Education  2021 Elsevier Inc.  

## 2021-04-13 NOTE — Transfer of Care (Signed)
Immediate Anesthesia Transfer of Care Note  Patient: Jorge Kelly  Procedure(s) Performed: CARDIOVERSION  Patient Location: Endoscopy Unit  Anesthesia Type:General  Level of Consciousness: awake, alert  and oriented  Airway & Oxygen Therapy: Patient Spontanous Breathing  Post-op Assessment: Report given to RN and Post -op Vital signs reviewed and stable  Post vital signs: Reviewed and stable  Last Vitals:  Vitals Value Taken Time  BP 135/98   Temp    Pulse 98   Resp 12   SpO2 94     Last Pain:  Vitals:   04/13/21 0755  TempSrc: Oral  PainSc:          Complications: No notable events documented.

## 2021-04-13 NOTE — Anesthesia Preprocedure Evaluation (Signed)
Anesthesia Evaluation  Patient identified by MRN, date of birth, ID band Patient awake    Reviewed: Allergy & Precautions, H&P , NPO status , Patient's Chart, lab work & pertinent test results  History of Anesthesia Complications Negative for: history of anesthetic complications  Airway Mallampati: II  TM Distance: >3 FB Neck ROM: full    Dental   Pulmonary neg pulmonary ROS,    Pulmonary exam normal        Cardiovascular + dysrhythmias Atrial Fibrillation  Rhythm:irregular   Echo 03/28/21: 1. Left ventricular ejection fraction, by estimation, is 65 to 70%. The left ventricle has normal function. The left ventricle has no regional wall motion abnormalities. Left ventricular diastolic function could not be evaluated. 2. Right ventricular systolic function is normal. The right ventricular size is normal. 3. Left atrial size was severely dilated. 4. Right atrial size was moderately dilated. 5. The mitral valve is grossly normal. Trivial mitral valve regurgitation. No evidence of mitral stenosis. 6. The aortic valve is tricuspid. Aortic valve regurgitation is mild. 7. Aortic dilatation noted. There is mild dilatation of the ascending aorta, measuring 40 mm. 8. The inferior vena cava is normal in size with greater than 50% respiratory variability, suggesting right atrial pressure of 3 mmHg.   Neuro/Psych negative neurological ROS  negative psych ROS   GI/Hepatic Neg liver ROS, GERD  ,  Endo/Other  negative endocrine ROS  Renal/GU negative Renal ROS  negative genitourinary   Musculoskeletal   Abdominal   Peds  Hematology   Anesthesia Other Findings   Reproductive/Obstetrics                             Anesthesia Physical Anesthesia Plan  ASA: 3  Anesthesia Plan: General   Post-op Pain Management:    Induction:   PONV Risk Score and Plan:   Airway Management Planned:    Additional Equipment:   Intra-op Plan:   Post-operative Plan:   Informed Consent: I have reviewed the patients History and Physical, chart, labs and discussed the procedure including the risks, benefits and alternatives for the proposed anesthesia with the patient or authorized representative who has indicated his/her understanding and acceptance.       Plan Discussed with: Anesthesiologist  Anesthesia Plan Comments:         Anesthesia Quick Evaluation

## 2021-04-13 NOTE — Anesthesia Procedure Notes (Signed)
Procedure Name: General with mask airway Date/Time: 04/13/2021 8:56 AM Performed by: Dorthea Cove, CRNA Pre-anesthesia Checklist: Patient identified, Emergency Drugs available, Suction available, Patient being monitored and Timeout performed Patient Re-evaluated:Patient Re-evaluated prior to induction Oxygen Delivery Method: Ambu bag Preoxygenation: Pre-oxygenation with 100% oxygen Induction Type: IV induction Ventilation: Mask ventilation without difficulty Placement Confirmation: CO2 detector Dental Injury: Teeth and Oropharynx as per pre-operative assessment

## 2021-04-13 NOTE — Interval H&P Note (Signed)
History and Physical Interval Note:  04/13/2021 7:41 AM  Durward Parcel  has presented today for surgery, with the diagnosis of AFIB.  The various methods of treatment have been discussed with the patient and family. After consideration of risks, benefits and other options for treatment, the patient has consented to  Procedure(s): CARDIOVERSION (N/A) as a surgical intervention.  The patient's history has been reviewed, patient examined, no change in status, stable for surgery.  I have reviewed the patient's chart and labs.  Questions were answered to the patient's satisfaction.    NPO. On eliquis > 3 weeks. No missed doses.   [place] T. Audie Box, MD, [facility]  [address] [place], [place] [address] [phone]  7:41 AM

## 2021-04-14 ENCOUNTER — Encounter (HOSPITAL_COMMUNITY): Payer: Self-pay | Admitting: Cardiovascular Disease

## 2021-04-14 NOTE — Telephone Encounter (Signed)
Pt had DCCV done yesterday as set up by afib clinic.  He is now back in afib.  Spoke with afib clinic about this and pt will come into their clinic on next Monday 9/12 at 10 am to be seen and go forth with plan to either load him with tikosyn or arrange ablation. Pt aware that he will see Adline Peals PA-C on 9/12 at 10 am with parking code [id number]. ED precautions provided to the pt if symptoms were to worsen between now and his appt Monday. Pt verbalized understanding and agrees with this plan. Pt was more than gracious for all the assistance provided.

## 2021-04-17 ENCOUNTER — Ambulatory Visit (HOSPITAL_COMMUNITY)
Admission: RE | Admit: 2021-04-17 | Discharge: 2021-04-17 | Disposition: A | Discharge status: Home / Self Care | Payer: BC Managed Care – PPO | Source: Ambulatory Visit | Attending: Physician Assistant | Admitting: Physician Assistant

## 2021-04-17 ENCOUNTER — Other Ambulatory Visit: Payer: Self-pay

## 2021-04-17 ENCOUNTER — Encounter (HOSPITAL_COMMUNITY): Payer: Self-pay | Admitting: Physician Assistant

## 2021-04-17 VITALS — BP 136/100 | HR 95 | Ht 74.0 in | Wt 240.6 lb

## 2021-04-17 DIAGNOSIS — I351 Nonrheumatic aortic (valve) insufficiency: Secondary | ICD-10-CM | POA: Diagnosis not present

## 2021-04-17 DIAGNOSIS — I4819 Other persistent atrial fibrillation: Secondary | ICD-10-CM | POA: Diagnosis not present

## 2021-04-17 DIAGNOSIS — E669 Obesity, unspecified: Secondary | ICD-10-CM | POA: Diagnosis not present

## 2021-04-17 DIAGNOSIS — Z6830 Body mass index (BMI) 30.0-30.9, adult: Secondary | ICD-10-CM | POA: Insufficient documentation

## 2021-04-17 DIAGNOSIS — I4891 Unspecified atrial fibrillation: Secondary | ICD-10-CM | POA: Diagnosis not present

## 2021-04-17 DIAGNOSIS — I10 Essential (primary) hypertension: Secondary | ICD-10-CM | POA: Diagnosis not present

## 2021-04-17 DIAGNOSIS — Z7901 Long term (current) use of anticoagulants: Secondary | ICD-10-CM | POA: Diagnosis not present

## 2021-04-17 DIAGNOSIS — I2510 Atherosclerotic heart disease of native coronary artery without angina pectoris: Secondary | ICD-10-CM | POA: Diagnosis not present

## 2021-04-17 NOTE — Progress Notes (Signed)
Primary Care Physician: [name], MD Primary Cardiologist: Dr Johney Frame  Primary Electrophysiologist: Dr [name] (remotely)  Referring Physician: [facility]    Jorge Kelly is a 65 y.o. male with a history of HTN and atrial fibrillation who presents for consultation in the [facility].  The patient was initially diagnosed with atrial fibrillation in 2015. He has been maintained on flecainide. Patient is on Eliquis for a CHADS2VASC score of 1. Patient had done well with little afib until 12/2020 when he was noted to be in afib at his follow up. Patient admits he ran out of his medication. He resumed the medication and a heart monitor was placed to evaluate if he was persistent vs paroxysmal. The monitor showed 100 % afib burden. He is symptomatic with fatigue, palpitations, and dizziness.   On follow up today, patient underwent unsuccessful DCCV on 04/13/21. Patient remains symptomatic with fatigue and generalized weakness. He denies any bleeding issues on anticoagulation.   Today, he denies symptoms of chest pain, shortness of breath, orthopnea, PND, lower extremity edema, presyncope, syncope, snoring, daytime somnolence, bleeding, or neurologic sequela. The patient is tolerating medications without difficulties and is otherwise without complaint today.    Atrial Fibrillation Risk Factors:  he does not have symptoms or diagnosis of sleep apnea. he does not have a history of rheumatic fever. he does not have a history of alcohol use. The patient does not have a history of early familial atrial fibrillation or other arrhythmias.  he has a BMI of Body mass index is 30.89 kg/m.Marland Kitchen Filed Weights   04/17/21 0958  Weight: 109.1 kg    Family History  Problem Relation Age of Onset   Lymphoma Mother    Atrial fibrillation Mother        pacemaker in 29s   Leukemia Father        passed from [place] Sister        treated, cancer free   Diabetes  type II Sister    Heart disease Maternal Uncle        mother lost 6 brothers to heart disease in 62s     Atrial Fibrillation Management history:  Previous antiarrhythmic drugs: Multaq, flecainide  Previous cardioversions: 2015, 2016 Previous ablations: none CHADS2VASC score: 1 Anticoagulation history: Eliquis   Past Medical History:  Diagnosis Date   Actinic keratosis 10/24/2009   ALLERGIC RHINITIS 05/11/2007   ELEVATED BLOOD PRESSURE WITHOUT DIAGNOSIS OF HYPERTENSION 11/12/2007   Dr. Debara Pickett mentioned HTN on problem list   Esophageal stricture    treated with protonix   GERD 05/11/2007   GI bleeding    20 years ago   Kidney stone    Melanoma ([facility]) 01/2014   remoted from forehead   MIGRAINE HEADACHE 05/11/2007   Overweight    Persistent atrial fibrillation ([facility]) 05/2014   Snores    Past Surgical History:  Procedure Laterality Date   ANKLE FRACTURE SURGERY  2010   x2-left   CARDIOVERSION N/A 06/23/2014   CARDIOVERSION N/A 09/09/2014   CARDIOVERSION N/A 04/13/2021   Procedure: CARDIOVERSION;  Surgeon: [name], MD;  Location: [facility];  Service: Cardiovascular;  Laterality: N/A;   malignant melanoma     removed from forehead in June 2015   STRABISMUS SURGERY Right 05/21/2014   Dr. Annamaria Boots   UPPER GI ENDOSCOPY     stricture, just PPI    Current Outpatient Medications  Medication Sig Dispense Refill   acetaminophen (TYLENOL)  500 MG tablet Take 1,000 mg by mouth every 8 (eight) hours as needed for moderate pain or headache.     apixaban (ELIQUIS) 5 MG TABS tablet Take 1 tablet (5 mg total) by mouth 2 (two) times daily. 180 tablet 3   diltiazem (CARDIZEM CD) 120 MG 24 hr capsule TAKE 1 CAPSULE BY MOUTH EVERY DAY 90 capsule 3   diltiazem (CARDIZEM) 60 MG tablet Take 1 tablet (60 mg total) by mouth daily as needed. For palpitations. 30 tablet 8   flecainide (TAMBOCOR) 100 MG tablet TAKE 1 TABLET BY MOUTH 2 TIMES DAILY. 60 tablet 6   rizatriptan (MAXALT-MLT) 10 MG  disintegrating tablet Take daily as needed for migraine May repeat in 2 hours if needed x1 9 tablet 2   rosuvastatin (CRESTOR) 20 MG tablet Take 1 tablet (20 mg total) by mouth daily. 90 tablet 3   No current facility-administered medications for this encounter.    No Known Allergies  Social History   Socioeconomic History   Marital status: Married    Spouse name: Not on file   Number of children: Not on file   Years of education: Not on file   Highest education level: Not on file  Occupational History   Occupation: Attorney  Tobacco Use   Smoking status: Never   Smokeless tobacco: Never  Vaping Use   Vaping Use: Never used  Substance and Sexual Activity   Alcohol use: No    Alcohol/week: 0.0 standard drinks   Drug use: No   Sexual activity: Not on file  Other Topics Concern   Not on file  Social History Narrative   Lives in [place] with wife (wife patient at [facility]).  2 sons.- 89 Logan at [employer] and Unity at [employer] in [place]  Wife with pregnancy at 31 and 39.       Trial lawyer. Criminal defense      Hobbies: sails usually in [place]    Social Determinants of Health   Financial Resource Strain: Not on file  Food Insecurity: Not on file  Transportation Needs: Not on file  Physical Activity: Not on file  Stress: Not on file  Social Connections: Not on file  Intimate Partner Violence: Not on file     ROS- All systems are reviewed and negative except as per the HPI above.  Physical Exam: Vitals:   04/17/21 0958  BP: (!) 136/100  Pulse: 95  Weight: 109.1 kg  Height: '6\' 2"'$  (1.88 m)   GEN- The patient is a well appearing obese male, alert and oriented x 3 today.   HEENT-head normocephalic, atraumatic, sclera clear, conjunctiva pink, hearing intact, trachea midline. Lungs- Clear to ausculation bilaterally, normal work of breathing Heart- irregular rate and rhythm, no murmurs, rubs or gallops  GI- soft, NT, ND, + BS Extremities- no  clubbing, cyanosis, or edema MS- no significant deformity or atrophy Skin- no rash or lesion Psych- euthymic mood, full affect Neuro- strength and sensation are intact   Wt Readings from Last 3 Encounters:  04/17/21 109.1 kg  04/13/21 106.6 kg  04/04/21 110.6 kg    EKG today demonstrates  Afib Vent. rate 95 BPM PR interval * ms QRS duration 116 ms QT/QTcB 336/422 ms  Echo 03/28/21 demonstrated   1. Left ventricular ejection fraction, by estimation, is 65 to 70%. The left ventricle has normal function. The left ventricle has no regional wall motion abnormalities. Left ventricular diastolic function could not be evaluated.   2. Right  ventricular systolic function is normal. The right ventricular  size is normal.   3. Left atrial size was severely dilated.   4. Right atrial size was moderately dilated.   5. The mitral valve is grossly normal. Trivial mitral valve  regurgitation. No evidence of mitral stenosis.   6. The aortic valve is tricuspid. Aortic valve regurgitation is mild.   7. Aortic dilatation noted. There is mild dilatation of the ascending  aorta, measuring 40 mm.   8. The inferior vena cava is normal in size with greater than 50%  respiratory variability, suggesting right atrial pressure of 3 mmHg.   Comparison(s): Prior images unable to be directly viewed, comparison made  by report only.   Conclusion(s)/Recommendation(s): Otherwise normal echocardiogram, with  minor abnormalities described in the report. Mild enlargement of ascending  aorta, previously noted as normal. Consider repeat imaging in 6-12 months.   Epic records are reviewed at length today  CHA2DS2-VASc Score = 1  The patient's score is based upon: CHF History: 0 HTN History: 1 Diabetes History: 0 Stroke History: 0 Vascular Disease History: 0 Age Score: 0 Gender Score: 0      ASSESSMENT AND PLAN: 1. Persistent Atrial Fibrillation (ICD10:  I48.19) The patient's CHA2DS2-VASc score is 1,  indicating a 0.6% annual risk of stroke.   S/p unsuccessful DCCV on 04/13/21 We discussed therapeutic options today including alternate AAD (dofetilide) vs ablation. Patient agreeable to EP refer to discuss ablation. Admittedly, his large LA may be a disincentive for ablation. Information about dofetilide given today and he is agreeable to dofetilide admission if not felt to be a good candidate for ablation.  Will stop flecainide as he is not deriving any benefit.  Continue diltiazem 120 mg daily with 60 mg PRN for heart racing Continue Eliquis 5 mg BID  2. Obesity Body mass index is 30.89 kg/m. Lifestyle modification was discussed and encouraged including regular physical activity and weight reduction.  3. CAD Calcium score 947 On statin No anginal symptoms.    Follow up with EP to discuss ablation vs dofetilide. He has seen Dr [name] previously.    [facility] [address] [place], [place] [address] [phone] 04/17/2021 10:29 AM

## 2021-04-21 NOTE — Telephone Encounter (Signed)
Jorge Kelly was able to get the pts appt with Dr. [name] moved to an earlier date on 10/5 at 2:15 pm, at our [facility] location.  Pt made aware of appt date and time by [facility].

## 2021-04-21 NOTE — Telephone Encounter (Signed)
Staff message as well as this mychart message from the pt, sent to [place], to see if she could assist with moving the pts appt with EP or Dr. [name], to a sooner date than 10/24.

## 2021-04-27 ENCOUNTER — Ambulatory Visit (HOSPITAL_COMMUNITY): Payer: BC Managed Care – PPO | Admitting: Physician Assistant

## 2021-05-10 ENCOUNTER — Other Ambulatory Visit: Payer: Self-pay

## 2021-05-10 ENCOUNTER — Ambulatory Visit (INDEPENDENT_AMBULATORY_CARE_PROVIDER_SITE_OTHER): Payer: BC Managed Care – PPO | Admitting: Internal Medicine

## 2021-05-10 ENCOUNTER — Encounter: Payer: Self-pay | Admitting: *Deleted

## 2021-05-10 VITALS — BP 118/82 | HR 102 | Ht 74.0 in | Wt 243.0 lb

## 2021-05-10 DIAGNOSIS — R0683 Snoring: Secondary | ICD-10-CM | POA: Diagnosis not present

## 2021-05-10 DIAGNOSIS — I4819 Other persistent atrial fibrillation: Secondary | ICD-10-CM

## 2021-05-10 MED ORDER — METOPROLOL TARTRATE 50 MG PO TABS: 50.0000 mg | ORAL_TABLET | Freq: Once | ORAL | 0 refills | Status: DC | PRN

## 2021-05-10 NOTE — Patient Instructions (Addendum)
Medication Instructions:  Your physician recommends that you continue on your current medications as directed. Please refer to the Current Medication list given to you today. *If you need a refill on your cardiac medications before your next appointment, please call your pharmacy*  Lab Work: None. If you have labs (blood work) drawn today and your tests are completely normal, you will receive your results only by: [place] (if you have MyChart) OR A paper copy in the mail If you have any lab test that is abnormal or we need to change your treatment, we will call you to review the results.  Testing/Procedures: Your physician has requested that you have cardiac CT. Cardiac computed tomography (CT) is a painless test that uses an x-ray machine to take clear, detailed pictures of your heart. For further information please visit [email or web address]. Please follow instruction sheet as given.   Your physician has recommended that you have an ablation. Catheter ablation is a medical procedure used to treat some cardiac arrhythmias (irregular heartbeats). During catheter ablation, a long, thin, flexible tube is put into a blood vessel in your groin (upper thigh), or neck. This tube is called an ablation catheter. It is then guided to your heart through the blood vessel. Radio frequency waves destroy small areas of heart tissue where abnormal heartbeats may cause an arrhythmia to start. Please see the instruction sheet given to you today.   Follow-Up: At [facility], you and your health needs are our priority.  As part of our continuing mission to provide you with exceptional heart care, we have created designated Provider Care Teams.  These Care Teams include your primary Cardiologist (physician) and Advanced Practice Providers (APPs -  Physician Assistants and Nurse Practitioners) who all work together to provide you with the care you need, when you need it.  We recommend signing up for the  patient portal called "MyChart".  Sign up information is provided on this After Visit Summary.  MyChart is used to connect with patients for Virtual Visits (Telemedicine).  Patients are able to view lab/test results, encounter notes, upcoming appointments, etc.  Non-urgent messages can be sent to your provider as well.   To learn more about what you can do with MyChart, go to [email or web address].    Any Other Special Instructions Will Be Listed Below (If Applicable).  Cardiac Ablation Cardiac ablation is a procedure to destroy (ablate) some heart tissue that is sending bad signals. These bad signals cause problems in heart rhythm. The heart has many areas that make these signals. If there are problems in these areas, they can make the heart beat in a way that is not normal. Destroying some tissues can help make the heart rhythm normal. Tell your doctor about: Any allergies you have. All medicines you are taking. These include vitamins, herbs, eye drops, creams, and over-the-counter medicines. Any problems you or family members have had with medicines that make you fall asleep (anesthetics). Any blood disorders you have. Any surgeries you have had. Any medical conditions you have, such as kidney failure. Whether you are pregnant or may be pregnant. What are the risks? This is a safe procedure. But problems may occur, including: Infection. Bruising and bleeding. Bleeding into the chest. Stroke or blood clots. Damage to nearby areas of your body. Allergies to medicines or dyes. The need for a pacemaker if the normal system is damaged. Failure of the procedure to treat the problem. What happens before the procedure? Medicines Ask your doctor about: Changing or  stopping your normal medicines. This is important. Taking aspirin and ibuprofen. Do not take these medicines unless your doctor tells you to take them. Taking other medicines, vitamins, herbs, and supplements. General  instructions Follow instructions from your doctor about what you cannot eat or drink. Plan to have someone take you home from the hospital or clinic. If you will be going home right after the procedure, plan to have someone with you for 24 hours. Ask your doctor what steps will be taken to prevent infection. What happens during the procedure?  An IV tube will be put into one of your veins. You will be given a medicine to help you relax. The skin on your neck or groin will be numbed. A cut (incision) will be made in your neck or groin. A needle will be put through your cut and into a large vein. A tube (catheter) will be put into the needle. The tube will be moved to your heart. Dye may be put through the tube. This helps your doctor see your heart. Small devices (electrodes) on the tube will send out signals. A type of energy will be used to destroy some heart tissue. The tube will be taken out. Pressure will be held on your cut. This helps stop bleeding. A bandage will be put over your cut. The exact procedure may vary among doctors and hospitals. What happens after the procedure? You will be watched until you leave the hospital or clinic. This includes checking your heart rate, breathing rate, oxygen, and blood pressure. Your cut will be watched for bleeding. You will need to lie still for a few hours. Do not drive for 24 hours or as long as your doctor tells you. Summary Cardiac ablation is a procedure to destroy some heart tissue. This is done to treat heart rhythm problems. Tell your doctor about any medical conditions you may have. Tell him or her about all medicines you are taking to treat them. This is a safe procedure. But problems may occur. These include infection, bruising, bleeding, and damage to nearby areas of your body. Follow what your doctor tells you about food and drink. You may also be told to change or stop some of your medicines. After the procedure, do not drive  for 24 hours or as long as your doctor tells you. This information is not intended to replace advice given to you by your health care provider. Make sure you discuss any questions you have with your health care provider. Document Revised: 06/25/2019 Document Reviewed: 06/25/2019 Elsevier Patient Education  2022 [employer].

## 2021-05-10 NOTE — Progress Notes (Signed)
Electrophysiology Office Note   Date:  05/10/2021   ID:  Jorge Kelly, DOB 1956-07-01, MRN [id number]  PCP:  [name], MD  Cardiologist:  Dr Johney Frame Primary Electrophysiologist: [name], MD    CC: afib   History of Present Illness: Jorge Kelly is a 65 y.o. male who presents today for electrophysiology evaluation.   He is referred by Dr Johney Frame and Adline Peals for EP evaluation.  He was initially diagnosed with afib in 2015. I saw him last in 2016 (my note reviewed).  He failed medical therapy with multaq.  He was placed on flecainide and did well for several years.  He recently returned in May with recurrence of afib after running out of his medicine.  [facility] 04/13/21 was unsuccessful. + palpitations and fatigue.  + decreased exercise tolerance  Today, he denies symptoms of chest pain, shortness of breath, orthopnea, PND, lower extremity edema, claudication, dizziness, presyncope, syncope, bleeding, or neurologic sequela. The patient is tolerating medications without difficulties and is otherwise without complaint today.    Past Medical History:  Diagnosis Date   Actinic keratosis 10/24/2009   ALLERGIC RHINITIS 05/11/2007   ELEVATED BLOOD PRESSURE WITHOUT DIAGNOSIS OF HYPERTENSION 11/12/2007   Dr. Debara Pickett mentioned HTN on problem list   Esophageal stricture    treated with protonix   GERD 05/11/2007   GI bleeding    20 years ago   Kidney stone    Melanoma ([place]) 01/2014   remoted from forehead   MIGRAINE HEADACHE 05/11/2007   Overweight    Persistent atrial fibrillation ([place]) 05/2014   Snores    Past Surgical History:  Procedure Laterality Date   ANKLE FRACTURE SURGERY  2010   x2-left   CARDIOVERSION N/A 06/23/2014   CARDIOVERSION N/A 09/09/2014   CARDIOVERSION N/A 04/13/2021   Procedure: CARDIOVERSION;  Surgeon: [name], MD;  Location: [place];  Service: Cardiovascular;  Laterality: N/A;   malignant melanoma     removed from forehead in June  2015   STRABISMUS SURGERY Right 05/21/2014   Dr. Annamaria Boots   UPPER GI ENDOSCOPY     stricture, just PPI     Current Outpatient Medications  Medication Sig Dispense Refill   acetaminophen (TYLENOL) 500 MG tablet Take 1,000 mg by mouth every 8 (eight) hours as needed for moderate pain or headache.     apixaban (ELIQUIS) 5 MG TABS tablet Take 1 tablet (5 mg total) by mouth 2 (two) times daily. 180 tablet 3   diltiazem (CARDIZEM CD) 120 MG 24 hr capsule TAKE 1 CAPSULE BY MOUTH EVERY DAY 90 capsule 3   diltiazem (CARDIZEM) 60 MG tablet Take 1 tablet (60 mg total) by mouth daily as needed. For palpitations. 30 tablet 8   rizatriptan (MAXALT-MLT) 10 MG disintegrating tablet Take daily as needed for migraine May repeat in 2 hours if needed x1 9 tablet 2   rosuvastatin (CRESTOR) 20 MG tablet Take 1 tablet (20 mg total) by mouth daily. 90 tablet 3   No current facility-administered medications for this visit.    Allergies:   Patient has no known allergies.   Social History:  The patient  reports that he has never smoked. He has never used smokeless tobacco. He reports that he does not drink alcohol and does not use drugs.   Family History:  The patient's  family history includes Atrial fibrillation in his mother; Cancer in his sister; Diabetes type II in his sister; Heart disease in his maternal  uncle; Leukemia in his father; Lymphoma in his mother.    ROS:  Please see the history of present illness.   All other systems are personally reviewed and negative.    PHYSICAL EXAM: VS:  BP 118/82   Pulse (!) 102   Ht 6\' 2"  (1.88 m)   Wt 243 lb (110.2 kg)   SpO2 94%   BMI 31.20 kg/m  , BMI Body mass index is 31.2 kg/m. GEN: Well nourished, well developed, in no acute distress HEENT: normal Neck: no JVD, carotid bruits, or masses Cardiac: iRRR; no murmurs, rubs, or gallops,no edema  Respiratory:  clear to auscultation bilaterally, normal work of breathing GI: soft, nontender, nondistended, +  BS MS: no deformity or atrophy Skin: warm and dry  Neuro:  Strength and sensation are intact Psych: euthymic mood, full affect  Echo 03/28/21- EF 65%, severe LA enlargement (5.6 cm), moderate RA enlargement  EKG:  EKG is ordered today. The ekg ordered today is personally reviewed and shows afib   Recent Labs: 04/04/2021: BUN 20; Creatinine, Ser 0.85; Hemoglobin 15.9; Platelets 152; Potassium 3.8; Sodium 137  personally reviewed   Lipid Panel     Component Value Date/Time   CHOL 103 10/16/2019 0830   TRIG 65 10/16/2019 0830   HDL 46 10/16/2019 0830   CHOLHDL 2.2 10/16/2019 0830   CHOLHDL 4 08/20/2019 1540   VLDL 16.2 08/20/2019 1540   LDLCALC 43 10/16/2019 0830   LDLDIRECT 96.1 09/02/2013 0839   personally reviewed   Wt Readings from Last 3 Encounters:  05/10/21 243 lb (110.2 kg)  04/17/21 240 lb 9.6 oz (109.1 kg)  04/13/21 235 lb (106.6 kg)      Other studies personally reviewed: Additional studies/ records that were reviewed today include: my prior notes, AF clinic notes, prior echo  Review of the above records today demonstrates: as above   ASSESSMENT AND PLAN:  1.  Persistent afib The patient has symptomatic, recurrent  atrial fibrillation. he has failed medical therapy with multaq and flecainide. Chads2vasc score is 0 (he denies HTN)  he is anticoagulated with eliquis . Therapeutic strategies for afib including medicine (tikosyn, amiodarone) and ablation were discussed in detail with the patient today. Risk, benefits, and alternatives to EP study and radiofrequency ablation for afib were also discussed in detail today. These risks include but are not limited to stroke, bleeding, vascular damage, tamponade, perforation, damage to the esophagus, lungs, and other structures, pulmonary vein stenosis, worsening renal function, and death. The patient understands these risk and wishes to proceed.  We will therefore proceed with catheter ablation at the next available time.   Carto, ICE, anesthesia are requested for the procedure.  Will also obtain cardiac CT prior to the procedure to exclude LAA thrombus and further evaluate atrial anatomy.   Risks, benefits and potential toxicities for medications prescribed and/or refilled reviewed with patient today.      SignedThompson Grayer, MD  05/10/2021 2:31 PM     [address] [address] [phone] (office) [phone] (fax)

## 2021-05-22 ENCOUNTER — Encounter (HOSPITAL_COMMUNITY): Payer: Self-pay

## 2021-05-23 ENCOUNTER — Other Ambulatory Visit: Payer: BC Managed Care – PPO | Admitting: *Deleted

## 2021-05-23 ENCOUNTER — Encounter (HOSPITAL_BASED_OUTPATIENT_CLINIC_OR_DEPARTMENT_OTHER): Payer: Self-pay

## 2021-05-23 ENCOUNTER — Other Ambulatory Visit: Payer: Self-pay

## 2021-05-23 DIAGNOSIS — R0683 Snoring: Secondary | ICD-10-CM | POA: Diagnosis not present

## 2021-05-23 DIAGNOSIS — I4819 Other persistent atrial fibrillation: Secondary | ICD-10-CM | POA: Diagnosis not present

## 2021-05-23 LAB — CBC WITH DIFFERENTIAL/PLATELET
Basophils Absolute: 0.1 10*3/uL (ref 0.0–0.2)
Basos: 1 %
EOS (ABSOLUTE): 0.2 10*3/uL (ref 0.0–0.4)
Eos: 3 %
Hematocrit: 48.2 % (ref 37.5–51.0)
Hemoglobin: 16.5 g/dL (ref 13.0–17.7)
Immature Grans (Abs): 0 10*3/uL (ref 0.0–0.1)
Immature Granulocytes: 0 %
Lymphocytes Absolute: 1.9 10*3/uL (ref 0.7–3.1)
Lymphs: 26 %
MCH: 31.1 pg (ref 26.6–33.0)
MCHC: 34.2 g/dL (ref 31.5–35.7)
MCV: 91 fL (ref 79–97)
Monocytes Absolute: 0.5 10*3/uL (ref 0.1–0.9)
Monocytes: 7 %
Neutrophils Absolute: 4.6 10*3/uL (ref 1.4–7.0)
Neutrophils: 63 %
Platelets: 165 10*3/uL (ref 150–450)
RBC: 5.3 x10E6/uL (ref 4.14–5.80)
RDW: 12 % (ref 11.6–15.4)
WBC: 7.2 10*3/uL (ref 3.4–10.8)

## 2021-05-23 LAB — BASIC METABOLIC PANEL
BUN/Creatinine Ratio: 16 (ref 10–24)
BUN: 16 mg/dL (ref 8–27)
CO2: 23 mmol/L (ref 20–29)
Calcium: 9.5 mg/dL (ref 8.6–10.2)
Chloride: 105 mmol/L (ref 96–106)
Creatinine, Ser: 1.01 mg/dL (ref 0.76–1.27)
Glucose: 90 mg/dL (ref 70–99)
Potassium: 4.6 mmol/L (ref 3.5–5.2)
Sodium: 139 mmol/L (ref 134–144)
eGFR: 84 mL/min/{1.73_m2} (ref >59)

## 2021-05-29 ENCOUNTER — Institutional Professional Consult (permissible substitution): Payer: BC Managed Care – PPO | Admitting: Internal Medicine

## 2021-06-06 ENCOUNTER — Other Ambulatory Visit: Payer: Self-pay | Admitting: Orthopaedic Surgery

## 2021-06-06 ENCOUNTER — Telehealth (HOSPITAL_COMMUNITY): Payer: Self-pay | Admitting: Emergency Medicine

## 2021-06-06 ENCOUNTER — Telehealth: Payer: Self-pay | Admitting: *Deleted

## 2021-06-06 ENCOUNTER — Encounter (HOSPITAL_COMMUNITY): Payer: Self-pay

## 2021-06-06 DIAGNOSIS — M47812 Spondylosis without myelopathy or radiculopathy, cervical region: Secondary | ICD-10-CM

## 2021-06-06 DIAGNOSIS — M5412 Radiculopathy, cervical region: Secondary | ICD-10-CM

## 2021-06-06 HISTORY — DX: Spondylosis without myelopathy or radiculopathy, cervical region: M47.812

## 2021-06-06 NOTE — Telephone Encounter (Signed)
Called to move timing of upcoming procedure for new arrival time of 8:30 am. Left message to call back

## 2021-06-06 NOTE — Telephone Encounter (Signed)
Attempted to call patient regarding upcoming cardiac CT appointment. °Left message on voicemail with name and callback number °Malissa Slay RN Navigator Cardiac Imaging °[facility] °[phone] Office °[phone] Cell ° °

## 2021-06-07 ENCOUNTER — Inpatient Hospital Stay
Payer: No Typology Code available for payment source | Attending: Orthopaedic Surgery | Admitting: Rehabilitative and Restorative Service Providers"

## 2021-06-07 VITALS — BP 118/72

## 2021-06-07 DIAGNOSIS — M542 Cervicalgia: Secondary | ICD-10-CM | POA: Insufficient documentation

## 2021-06-07 NOTE — Progress Notes (Signed)
Name:Marcus Mcneil Age: 65 y.o.   Date of Service: 06/07/2021  Referring Physician: Duanne Guess, *   Date of Injury: 06/01/2021  Date Care Plan Established/Reviewed: 06/07/2021  Date Treatment Started: 06/07/2021  Date Care Plan Established/Reviewed No data was found  Date Treatment Started No data was found  (Historic) Date Care Plan Established/Reviewed No data was found  (Historic) Date Treatment Started No data was found   End of Certification Date: 09/04/2021  Sessions in Plan of Care: 16  Surgery Date: No data was found  MD Follow-up: No data was found  Medbridge Code: No data was found    Visit Count: 1   Diagnosis:   1. Cervicalgia        Subjective     History of Present Illness   History of Present Illness: Patient reports that last Thursday he was getting discomfort to his L shoulder blade. He later noted that he was getting numbness down his L arm. Prior to this, patient denies having any history of neck pain. Patient describes the pain to his L shoulder blade and L arm as a sharp ache which is intermittent. Patient reports that the pain is worse at the end of the day and he attributes this to sitting for prolonged periods of time. Patient notes a relief in his symptoms when taking Aleve and when using topical gels on the area. Patient notes that he gets numbness and tingling to his L 5th digit. Patient denies any constitutional signs or symptoms. Patient's goals with the therapy are to get rid of the discomfort.   Functional Limitations (PLOF): Patient is having difficulty sleeping more than 4 hours at night. (Patient was able to sleep 7-8 hours at night).   Patient is having difficulty changing lanes and backing up his car. (Patient was able to change lanes and back up his car).   Patient is having difficulty sitting for more than 30 minutes when watching television. (Patient was able to sit for 2 hours when watching television).     Outcome Measure   Tool Used/Details: Foto    Pain   Current pain  rating: 2  At best pain rating: 1  At worst pain rating: 6  Location: L C6-C7 TPs and L 1st rib    Social Support/Occupation    Lives in: multiple level home    Lives with: spouse    Occupation: Education officer, environmental.    Precautions: No data was found  Allergies: Patient has no allergy information on record.    No past medical history on file.    Objective   FHP, increased thoracic kyphosis, B rounded shoulders.     Integumentary   no wound, lesion or rash noted    Palpation 06/07/2021 Mod TTP suboccipitals and CT junction     Neurological Testing     Sensation   Cervical/Thoracic   Left   Intact: light touch    Right   Intact: light touch           BP: 118/72      Treatment     Therapeutic Exercises - Justified to address any of the following:  To develop strength, endurance, ROM and/or flexibility.   HEP reviewed to ensure patient compliance.     B Cervical towel rotation 20 x on each side with verbal cuing on correct hand placement     Supine laying on half foam roll for 5 mins with verbal cuing to perform in hooklying  Manual Therapy - Justified to address any of the following:    Mobilization of joints and soft tissues, manipulation, manual lymphatic drainage, and/or manual traction.    Grade III/IV PA mobs to C1-C2 and C6-C7 in sitting    Home Exercises   Access Code: [email or web address]  URL: [email or web address]  Date: 06/07/2021  Prepared by: Alyse Low    Exercises  Seated Assisted Cervical Rotation with Towel - 1 x daily - 7 x weekly - 3 sets - 10 reps  Supine Thoracic Mobilization Foam Roll Vertical - 1 x daily - 7 x weekly - hold       Cervical AROM  Initial        Flexion 50       Extension 15 p!       Side Bend R 20 p!       Side Bend L 20 p!       Rotation R 35 p! To shoulder blade       Rotation L 45 p!       (blank fields were intentionally left blank)   ---      Flowsheet Row ---   Total Time    Timed Minutes 19 minutes   Untimed Minutes 15 minutes   Total Time 34 minutes          Cervical  Strength PSI Initial        Flexion (C1/2) 4-       Extension        Side Bend R (C3)        Side Bend L (C3)        Rotation R        Rotation L        DNF Lift (sec)        (blank fields were intentionally left blank)    UE Strength  MMT Initial  R Initial   L   R   L   R   L   R   L   R   L   Shoulder Flex 5 4-           Shoulder Abd (C5) 5 4           Shoulder Ext             Shoulder ER 5 4           Shoulder IR 5 5           Rhomboids             Serratus             Upper Trap (C4)             Mid Trap (C4)             Lower Trap (C4)             Biceps (C6) 5 4           Triceps (C7) 5 4           Wrist Ext (C7) 5 4           Wrist Flex (C7)             (blank fields were intentionally left blank)      06/07/2021 All shoulder ROM WNL.   Assessment   Marcus Mcneil is a 65 y.o. male presenting with cervicalgia who requires Physical Therapy for the impairments listed below.    Pain located:  L C6-C7 TPs and L 1st rib     Clinical presentation: stable - predictable recovery pattern  Barriers to therapy: No barriers to rehab.        Impairments: IPTC Impairments: Pain that limits and interferes with functional ability  Decreased range of motion  Decreased strength  Decreased joint mobility  Functional Limitations (PLOF): Patient is having difficulty sleeping more than 4 hours at night. (Patient was able to sleep 7-8 hours at night).   Patient is having difficulty changing lanes and backing up his car. (Patient was able to change lanes and back up his car).   Patient is having difficulty sitting for more than 30 minutes when watching television. (Patient was able to sit for 2 hours when watching television).   Prognosis: excellent  Patient is aware of diagnosis, prognosis and consents to plan of care: Yes  Plan   Visits per week: 2  Number of Sessions: 16  Direct One on One  74259: Therapeutic Exercise: To Develop Strength and Endurance, ROM and Flexibility  [phone]: Neuromuscular Reeducation (Proprioceptive Neuromuscular  Faciliation)  97140: Manual Therapy techniques (mobilization, manipulation, manual traction) (Grade I-V to cervical spine, thoracic spine, shoulder girdle, and regionally interdependent joints, soft tissue mobilization, instrument assisted soft tissue mobilization.)  97530: Therapeutic Activities: Dynamic activities to improve functional performance  Dry Needling  Supervised Modalities  97010: Thermal modalities: hot/cold packs  56433: Mechnical traction  97014: Electrical stimulation  Plan for next session: Improve cervical rotation and sidebending. Avoid prolonged stretching because of distal nerve symptoms.     Goals      Goal 1: Patient will demonstrate independence in prescribed HEP with proper form, sets and reps for safe discharge to an independent program.   Sessions: 16      Goal 2: Patient will be able to sleep 7-8 hours without pain or discomfort.   Sessions: 16      Goal 3: Increase active cervical rotation to 60 degrees for increased visual field to allow safe changing lanes and backing up vehicle while driving.   Sessions: 16      Goal 4: Increase longus colli strength to a 5/5 for maintenance of proper sitting posture needed for television use > 2 hours.   Sessions: 16                                  Francis Dowse, DPT

## 2021-06-07 NOTE — Telephone Encounter (Signed)
Left message on patients phone.   Called wife. (DPR) Advised about new arrival time for procedure.  Verbalized understanding and agreement.

## 2021-06-07 NOTE — Telephone Encounter (Signed)
Spoke to patient's wife (DPR) advised about ablation and discussed this note as well. Let her know what time Tarig needed to arrive for CT.

## 2021-06-08 ENCOUNTER — Ambulatory Visit (HOSPITAL_COMMUNITY)
Admission: RE | Admit: 2021-06-08 | Discharge: 2021-06-08 | Disposition: A | Discharge status: Home / Self Care | Payer: BC Managed Care – PPO | Source: Ambulatory Visit | Attending: Internal Medicine | Admitting: Internal Medicine

## 2021-06-08 ENCOUNTER — Encounter (HOSPITAL_COMMUNITY): Payer: Self-pay

## 2021-06-08 ENCOUNTER — Other Ambulatory Visit: Payer: Self-pay

## 2021-06-08 DIAGNOSIS — I4819 Other persistent atrial fibrillation: Secondary | ICD-10-CM | POA: Insufficient documentation

## 2021-06-08 MED ORDER — METOPROLOL TARTRATE 5 MG/5ML IV SOLN
INTRAVENOUS | Status: AC
  Filled 2021-06-08: qty 5

## 2021-06-08 MED ORDER — METOPROLOL TARTRATE 5 MG/5ML IV SOLN: 5.0000 mg | INTRAVENOUS | Status: DC | PRN

## 2021-06-08 MED ORDER — METOPROLOL TARTRATE 5 MG/5ML IV SOLN
INTRAVENOUS | Status: AC
  Administered 2021-06-08: 10 mg via INTRAVENOUS
  Filled 2021-06-08: qty 10

## 2021-06-08 MED ORDER — IOHEXOL 350 MG/ML SOLN
100.0000 mL | Freq: Once | INTRAVENOUS | Status: AC | PRN
  Administered 2021-06-08: 80 mL via INTRAVENOUS

## 2021-06-14 ENCOUNTER — Inpatient Hospital Stay: Payer: No Typology Code available for payment source | Attending: Orthopaedic Surgery | Admitting: Professional

## 2021-06-14 DIAGNOSIS — M542 Cervicalgia: Secondary | ICD-10-CM | POA: Insufficient documentation

## 2021-06-14 NOTE — PT/OT Therapy Note (Signed)
Name: Marcus Mcneil Age: 65 y.o.   Date of Service: 06/14/2021  Referring Physician: Duanne Guess, *   Date of Injury: 06/01/2021  Date Care Plan Established/Reviewed: 06/07/2021  Date Treatment Started: 06/07/2021  Date Care Plan Established/Reviewed No data was found  Date Treatment Started No data was found  (Historic) Date Care Plan Established/Reviewed No data was found  (Historic) Date Treatment Started No data was found   End of Certification Date: 09/04/2021  Sessions in Plan of Care: 16  Surgery Date: No data was found  MD Follow-up: No data was found  Medbridge Code: No data was found    Visit Count: 2   Diagnosis:   1. Cervicalgia        Subjective     Daily Subjective   Pt reports he has been performing HEP, thinks cervical AROM is improving, but sometimes feels increased scapular pain during and after thoracic mobilization.     Social Support/Occupation    Lives in: multiple level home    Lives with: spouse    Occupation: Education officer, environmental.    Precautions: No data was found  Allergies: Patient has no allergy information on record.    Functional Limitations (PLOF):   Patient is having difficulty sleeping more than 4 hours at night. (Patient was able to sleep 7-8 hours at night).   Patient is having difficulty changing lanes and backing up his car. (Patient was able to change lanes and back up his car).   Patient is having difficulty sitting for more than 30 minutes when watching television. (Patient was able to sit for 2 hours when watching television).      Outcome Measure   Tool Used/Details: Foto    Objective            Initial Evaluation Reference and/or Current Measurements(as dated):  Posture  Initial 06/07/21: FHP, increased thoracic kyphosis, B rounded shoulders.      Palpation   Initial 06/07/21:  Mod TTP suboccipitals and CT junction     Cervical AROM  Initial            Flexion 50           Extension 15 p!           Side Bend R 20 p!           Side Bend L 20 p!           Rotation R 35 p! To shoulder  blade           Rotation L 45 p!           (blank fields were intentionally left blank)     Cervical Strength /5 Initial            Flexion (C1/2) 4-           Extension             Side Bend R (C3)             Side Bend L (C3)             Rotation R             Rotation L             DNF Lift (sec)             (blank fields were intentionally left blank)    Treatment     Therapeutic Exercises - Justified to address any of the following:  To develop strength, endurance, ROM and/or flexibility.   Subjective obtained to guide treatment planning    HEP from 06/07/21 reviewed and performed, see below.    supine on 1/2 sm FR series for AROM and mobility, 1 min ea  -dynamic pec BL Hor ABD  -swimmers  -snow angel  -goal post IR/ER     Neuromuscular Re-Education - Justified to address any of the following:   Re-education of movement, balance, coordination, kinesthetic sense, posture and/or proprioception for sitting and/or standing activities.   Supine axial elongation+ chin tuck into pillow 10" hold x 10 reps V/TC to improve DNF/longus colli    Supine head lift for DNF recruitment 3 x 30"     Seated axial elongation with retraction x 10 reps    Seated pivot prone for scapular proprioception x 15 reps mirror demo and VC    B SH rows CC level 7 x 15 reps then level 8 x 15 reps in staggered stance TC for scap retraction decreased UT activation    B SH ext at CC level 7 2 x 15 reps TC for scap retraction      Home Exercises   Access Code: [email or web address]  URL: [email or web address]  Date: 06/07/2021  Prepared by: Alyse Low     Exercises  Seated Assisted Cervical Rotation with Towel - 1 x daily - 7 x weekly - 3 sets - 10 reps  Supine Thoracic Mobilization Foam Roll Vertical - 1 x daily - 7 x weekly - hold     ---      Flowsheet Row ---   Total Time    Timed Minutes 40 minutes   Total Time 40 minutes          Assessment   Today's session focused on cervical and scapular postural proprioception, NMR, and  strengthening. [name] exhibits forward head posture with good kinesthetic awareness and responsiveness to verbal and tactile cues to improve activation of DNF in supine and seated. Pt demonstrates good scapular retraction with CC, but has some lumbar extension noted. HEP reviewed for carryover and functional goals addressed (see below). Pt would benefit from continued skilled PT to progress scapular stability in open and closed kinetic chain.     Plan   Avoid prolonged stretching because of distal nerve symptoms.   Prone on PB I, T, Y  WB protraction/retraction    Goals      Goal 1: Patient will demonstrate independence in prescribed HEP with proper form, sets and reps for safe discharge to an independent program.     Access Code: [email or web address]  URL: [email or web address]  Date: 06/07/2021  Prepared by: Alyse Low    Exercises  Seated Assisted Cervical Rotation with Towel - 1 x daily - 7 x weekly - 3 sets - 10 reps  Supine Thoracic Mobilization Foam Roll Vertical - 1 x daily - 7 x weekly - hold     Sessions: 16      Goal 2: Patient will be able to sleep 7-8 hours without pain or discomfort.   Sessions: 16      Goal 3: Increase active cervical rotation to 60 degrees for increased visual field to allow safe changing lanes and backing up vehicle while driving.   Sessions: 16      Goal 4: Increase longus colli strength to a 5/5 for maintenance of proper sitting posture needed for television use > 2 hours.   Sessions: 16  Laureen Ochs, LPTA

## 2021-06-14 NOTE — Pre-Procedure Instructions (Signed)
Instructed patient on the following items: Arrival time 0830 Nothing to eat or drink after midnight No meds AM of procedure Responsible person to drive you home and stay with you for 24 hrs  Have you missed any doses of anti-coagulant Eliquis- hasn't missed any doses   

## 2021-06-15 ENCOUNTER — Ambulatory Visit (HOSPITAL_COMMUNITY)
Admission: RE | Admit: 2021-06-15 | Discharge: 2021-06-15 | Disposition: A | Discharge status: Home / Self Care | Payer: BC Managed Care – PPO | Attending: Internal Medicine | Admitting: Internal Medicine

## 2021-06-15 ENCOUNTER — Other Ambulatory Visit: Payer: Self-pay

## 2021-06-15 ENCOUNTER — Ambulatory Visit (HOSPITAL_COMMUNITY): Payer: BC Managed Care – PPO | Admitting: Anesthesiology

## 2021-06-15 ENCOUNTER — Encounter (HOSPITAL_COMMUNITY)
Admission: RE | Disposition: A | Discharge status: Home / Self Care | Payer: Self-pay | Source: Home / Self Care | Attending: Internal Medicine

## 2021-06-15 DIAGNOSIS — G43909 Migraine, unspecified, not intractable, without status migrainosus: Secondary | ICD-10-CM | POA: Diagnosis not present

## 2021-06-15 DIAGNOSIS — Z7901 Long term (current) use of anticoagulants: Secondary | ICD-10-CM | POA: Insufficient documentation

## 2021-06-15 DIAGNOSIS — I4819 Other persistent atrial fibrillation: Secondary | ICD-10-CM | POA: Diagnosis not present

## 2021-06-15 DIAGNOSIS — D696 Thrombocytopenia, unspecified: Secondary | ICD-10-CM | POA: Diagnosis not present

## 2021-06-15 DIAGNOSIS — K219 Gastro-esophageal reflux disease without esophagitis: Secondary | ICD-10-CM | POA: Diagnosis not present

## 2021-06-15 HISTORY — PX: ATRIAL FIBRILLATION ABLATION: EP1191

## 2021-06-15 LAB — POCT ACTIVATED CLOTTING TIME
Activated Clotting Time: 231 seconds
Activated Clotting Time: 265 seconds
Activated Clotting Time: 283 seconds

## 2021-06-15 SURGERY — ATRIAL FIBRILLATION ABLATION
Anesthesia: General

## 2021-06-15 MED ORDER — PANTOPRAZOLE SODIUM 40 MG PO TBEC: 40.0000 mg | DELAYED_RELEASE_TABLET | Freq: Every day | ORAL | 0 refills | Status: DC

## 2021-06-15 MED ORDER — SODIUM CHLORIDE 0.9 % IV SOLN: INTRAVENOUS | Status: DC

## 2021-06-15 MED ORDER — APIXABAN 5 MG PO TABS
5.0000 mg | ORAL_TABLET | Freq: Once | ORAL | Status: AC
  Administered 2021-06-15: 5 mg via ORAL
  Filled 2021-06-15: qty 1

## 2021-06-15 MED ORDER — SODIUM CHLORIDE 0.9% FLUSH: 3.0000 mL | Freq: Two times a day (BID) | INTRAVENOUS | Status: DC

## 2021-06-15 MED ORDER — SODIUM CHLORIDE 0.9 % IV SOLN: 250.0000 mL | INTRAVENOUS | Status: DC | PRN

## 2021-06-15 MED ORDER — ONDANSETRON HCL 4 MG/2ML IJ SOLN: 4.0000 mg | Freq: Four times a day (QID) | INTRAMUSCULAR | Status: DC | PRN

## 2021-06-15 MED ORDER — PHENYLEPHRINE HCL-NACL 20-0.9 MG/250ML-% IV SOLN
INTRAVENOUS | Status: DC | PRN
  Administered 2021-06-15: 35 ug/min via INTRAVENOUS
  Administered 2021-06-15: 20 ug/min via INTRAVENOUS

## 2021-06-15 MED ORDER — HEPARIN SODIUM (PORCINE) 1000 UNIT/ML IJ SOLN
INTRAMUSCULAR | Status: DC | PRN
  Administered 2021-06-15 (×2): 5000 [IU] via INTRAVENOUS

## 2021-06-15 MED ORDER — HYDROCODONE-ACETAMINOPHEN 5-325 MG PO TABS
1.0000 | ORAL_TABLET | ORAL | Status: DC | PRN
  Filled 2021-06-15: qty 2

## 2021-06-15 MED ORDER — FENTANYL CITRATE (PF) 100 MCG/2ML IJ SOLN
INTRAMUSCULAR | Status: DC | PRN
  Administered 2021-06-15: 100 ug via INTRAVENOUS

## 2021-06-15 MED ORDER — DEXAMETHASONE SODIUM PHOSPHATE 10 MG/ML IJ SOLN
INTRAMUSCULAR | Status: DC | PRN
  Administered 2021-06-15: 10 mg via INTRAVENOUS

## 2021-06-15 MED ORDER — SODIUM CHLORIDE 0.9% FLUSH: 3.0000 mL | INTRAVENOUS | Status: DC | PRN

## 2021-06-15 MED ORDER — PROPOFOL 10 MG/ML IV BOLUS
INTRAVENOUS | Status: DC | PRN
  Administered 2021-06-15: 200 mg via INTRAVENOUS

## 2021-06-15 MED ORDER — HEPARIN SODIUM (PORCINE) 1000 UNIT/ML IJ SOLN
INTRAMUSCULAR | Status: AC
  Filled 2021-06-15: qty 2

## 2021-06-15 MED ORDER — ACETAMINOPHEN 325 MG PO TABS: 650.0000 mg | ORAL_TABLET | ORAL | Status: DC | PRN

## 2021-06-15 MED ORDER — MIDAZOLAM HCL 5 MG/5ML IJ SOLN
INTRAMUSCULAR | Status: DC | PRN
  Administered 2021-06-15: 2 mg via INTRAVENOUS

## 2021-06-15 MED ORDER — HEPARIN SODIUM (PORCINE) 1000 UNIT/ML IJ SOLN
INTRAMUSCULAR | Status: DC | PRN
  Administered 2021-06-15: 1000 [IU] via INTRAVENOUS
  Administered 2021-06-15: 15000 [IU] via INTRAVENOUS

## 2021-06-15 MED ORDER — PROTAMINE SULFATE 10 MG/ML IV SOLN
INTRAVENOUS | Status: DC | PRN
  Administered 2021-06-15: 40 mg via INTRAVENOUS

## 2021-06-15 MED ORDER — SUGAMMADEX SODIUM 200 MG/2ML IV SOLN
INTRAVENOUS | Status: DC | PRN
  Administered 2021-06-15: 200 mg via INTRAVENOUS

## 2021-06-15 MED ORDER — ROCURONIUM BROMIDE 10 MG/ML (PF) SYRINGE
PREFILLED_SYRINGE | INTRAVENOUS | Status: DC | PRN
  Administered 2021-06-15: 20 mg via INTRAVENOUS
  Administered 2021-06-15: 10 mg via INTRAVENOUS
  Administered 2021-06-15: 20 mg via INTRAVENOUS
  Administered 2021-06-15: 60 mg via INTRAVENOUS

## 2021-06-15 MED ORDER — ONDANSETRON HCL 4 MG/2ML IJ SOLN
INTRAMUSCULAR | Status: DC | PRN
  Administered 2021-06-15: 4 mg via INTRAVENOUS

## 2021-06-15 MED ORDER — HEPARIN (PORCINE) IN NACL 1000-0.9 UT/500ML-% IV SOLN
INTRAVENOUS | Status: DC | PRN
  Administered 2021-06-15 (×3): 500 mL

## 2021-06-15 SURGICAL SUPPLY — 17 items
BLANKET WARM UNDERBOD FULL ACC (MISCELLANEOUS) ×2 IMPLANT
CATH OCTARAY 2.0 F 3-3-3-3-3 (CATHETERS) ×2 IMPLANT
CATH SMTCH THERMOCOOL SF DF (CATHETERS) ×2 IMPLANT
CATH SOUNDSTAR ECO 8FR (CATHETERS) ×2 IMPLANT
CATH WEBSTER BI DIR CS D-F CRV (CATHETERS) ×2 IMPLANT
CLOSURE PERCLOSE PROSTYLE (VASCULAR PRODUCTS) ×8 IMPLANT
COVER SWIFTLINK CONNECTOR (BAG) ×2 IMPLANT
NDL BAYLIS TRANSSEPTAL 71CM (NEEDLE) IMPLANT
NEEDLE BAYLIS TRANSSEPTAL 71CM (NEEDLE) ×2 IMPLANT
PACK EP LATEX FREE (CUSTOM PROCEDURE TRAY) ×2
PACK EP LF (CUSTOM PROCEDURE TRAY) ×1 IMPLANT
PAD PRO RADIOLUCENT 2001M-C (PAD) ×2 IMPLANT
PATCH CARTO3 (PAD) ×2 IMPLANT
SHEATH PINNACLE 7F 10CM (SHEATH) ×4 IMPLANT
SHEATH PINNACLE 9F 10CM (SHEATH) ×1 IMPLANT
SHEATH SWARTZ TS SL2 63CM 8.5F (SHEATH) ×2 IMPLANT
TUBING SMART ABLATE COOLFLOW (TUBING) ×2 IMPLANT

## 2021-06-15 NOTE — Anesthesia Procedure Notes (Addendum)
Procedure Name: Intubation Date/Time: 06/15/2021 10:15 AM Performed by: Lavell Luster, CRNA Pre-anesthesia Checklist: Patient identified, Emergency Drugs available, Suction available, Patient being monitored and Timeout performed Patient Re-evaluated:Patient Re-evaluated prior to induction Oxygen Delivery Method: Circle system utilized Preoxygenation: Pre-oxygenation with 100% oxygen Induction Type: IV induction Ventilation: Mask ventilation without difficulty Laryngoscope Size: Mac, 4 and Glidescope Grade View: Grade I Tube type: Oral Tube size: 7.5 mm Number of attempts: 2 Airway Equipment and Method: Stylet and Video-laryngoscopy Placement Confirmation: ETT inserted through vocal cords under direct vision, positive ETCO2 and breath sounds checked- equal and bilateral Secured at: 23 cm Tube secured with: Tape Dental Injury: Teeth and Oropharynx as per pre-operative assessment  Comments: DL x 1 with glidescope, ETT passed easily but cuff leak noted and no ETCO2, switched tube to 7.5 cuffed under glidescope direct visualization.  + ETCO2 BBSE.  Dr Ola Spurr verified placement.  Henderson Cloud, CRNA

## 2021-06-15 NOTE — Transfer of Care (Signed)
Immediate Anesthesia Transfer of Care Note  Patient: Jorge Kelly  Procedure(s) Performed: ATRIAL FIBRILLATION ABLATION  Patient Location: Cath Lab  Anesthesia Type:General  Level of Consciousness: awake, alert  and oriented  Airway & Oxygen Therapy: Patient connected to nasal cannula oxygen  Post-op Assessment: Post -op Vital signs reviewed and stable  Post vital signs: stable  Last Vitals:  Vitals Value Taken Time  BP 126/99 06/15/21 1236  Temp    Pulse 92 06/15/21 1237  Resp 25 06/15/21 1237  SpO2 94 % 06/15/21 1237  Vitals shown include unvalidated device data.  Last Pain:  Vitals:   06/15/21 0915  TempSrc:   PainSc: 0-No pain         Complications: No notable events documented.

## 2021-06-15 NOTE — H&P (Signed)
CC: afib   History of Present Illness: Jorge Kelly is a 65 y.o. male who presents today for electrophysiology study and ablation for afib.   He was initially diagnosed with afib in 2015. I saw him last in 2016 (my note reviewed).  He failed medical therapy with multaq.  He was placed on flecainide and did well for several years.  He recently returned in May with recurrence of afib after running out of his medicine.  [facility] 04/13/21 was unsuccessful. + palpitations and fatigue.  + decreased exercise tolerance   Today, he denies symptoms of chest pain, shortness of breath, orthopnea, PND, lower extremity edema, claudication, dizziness, presyncope, syncope, bleeding, or neurologic sequela. The patient is tolerating medications without difficulties and is otherwise without complaint today.          Past Medical History:  Diagnosis Date   Actinic keratosis 10/24/2009   ALLERGIC RHINITIS 05/11/2007   ELEVATED BLOOD PRESSURE WITHOUT DIAGNOSIS OF HYPERTENSION 11/12/2007    Dr. Debara Pickett mentioned HTN on problem list   Esophageal stricture      treated with protonix   GERD 05/11/2007   GI bleeding      20 years ago   Kidney stone     Melanoma ([place]) 01/2014    remoted from forehead   MIGRAINE HEADACHE 05/11/2007   Overweight     Persistent atrial fibrillation ([place]) 05/2014   Snores           Past Surgical History:  Procedure Laterality Date   ANKLE FRACTURE SURGERY   2010    x2-left   CARDIOVERSION N/A 06/23/2014   CARDIOVERSION N/A 09/09/2014   CARDIOVERSION N/A 04/13/2021    Procedure: CARDIOVERSION;  Surgeon: [name], MD;  Location: [place];  Service: Cardiovascular;  Laterality: N/A;   malignant melanoma        removed from forehead in June 2015   STRABISMUS SURGERY Right 05/21/2014    Dr. Annamaria Boots   UPPER GI ENDOSCOPY        stricture, just PPI              Current Outpatient Medications  Medication Sig Dispense Refill   acetaminophen (TYLENOL) 500 MG tablet Take 1,000 mg  by mouth every 8 (eight) hours as needed for moderate pain or headache.       apixaban (ELIQUIS) 5 MG TABS tablet Take 1 tablet (5 mg total) by mouth 2 (two) times daily. 180 tablet 3   diltiazem (CARDIZEM CD) 120 MG 24 hr capsule TAKE 1 CAPSULE BY MOUTH EVERY DAY 90 capsule 3   diltiazem (CARDIZEM) 60 MG tablet Take 1 tablet (60 mg total) by mouth daily as needed. For palpitations. 30 tablet 8   rizatriptan (MAXALT-MLT) 10 MG disintegrating tablet Take daily as needed for migraine May repeat in 2 hours if needed x1 9 tablet 2   rosuvastatin (CRESTOR) 20 MG tablet Take 1 tablet (20 mg total) by mouth daily. 90 tablet 3    No current facility-administered medications for this visit.      Allergies:   Patient has no known allergies.    Social History:  The patient  reports that he has never smoked. He has never used smokeless tobacco. He reports that he does not drink alcohol and does not use drugs.    Family History:  The patient's  family history includes Atrial fibrillation in his mother; Cancer in his sister; Diabetes type II in his sister; Heart disease in his maternal uncle; Leukemia  in his father; Lymphoma in his mother.      ROS:  Please see the history of present illness.   All other systems are personally reviewed and negative.      PHYSICAL EXAM: Vitals:   06/15/21 0859  BP: (!) 131/95  Pulse: 94  Temp: 98.2 F (36.8 C)  SpO2: 98%   GEN: well developed, in no acute distress HEENT: normal Neck: no JVD, carotid bruits, or masses Cardiac: iRRR; no murmurs, rubs, or gallops,no edema  Respiratory:  clear to auscultation bilaterally, normal work of breathing GI: soft, nontender, nondistended, + BS MS: no deformity or atrophy Skin: warm and dry  Neuro:  Strength and sensation are intact Psych: euthymic mood, full affect   Echo 03/28/21- EF 65%, severe LA enlargement (5.6 cm), moderate RA enlargement Reviewed with him again today        ASSESSMENT AND PLAN:   1.   Persistent afib The patient has symptomatic, recurrent  atrial fibrillation. he has failed medical therapy with multaq and flecainide. he is anticoagulated with eliquis .   Risk, benefits, and alternatives to EP study and radiofrequency ablation for afib were again discussed in detail today. These risks include but are not limited to stroke, bleeding, vascular damage, tamponade, perforation, damage to the esophagus, lungs, and other structures, pulmonary vein stenosis, worsening renal function, and death. The patient understands these risk and wishes to proceed.    Cardiac CT reviewed at length with the patient today.  he reports compliance with [place] without interruption.  [name] MD, [place] 06/15/2021 10:03 AM

## 2021-06-15 NOTE — Discharge Instructions (Signed)
Post procedure care instructions No driving for 4 days. No lifting over 5 lbs for 1 week. No vigorous or sexual activity for 1 week. You may return to work/your usual activities on 06/23/21. Keep procedure site clean & dry. If you notice increased pain, swelling, bleeding or pus, call/return!  You may shower after 24 hours, but no soaking in baths/hot tubs/pools for 1 week.    You have an appointment set up with the [facility].  Multiple studies have shown that being followed by a dedicated atrial fibrillation clinic in addition to the standard care you receive from your other physicians improves health. We believe that enrollment in the atrial fibrillation clinic will allow [place] to better care for you.   The phone number to the [facility] is [phone]. The clinic is staffed Monday through Friday from 8:30am to 5pm.  Parking Directions: The clinic is located in the Heart and Vascular Building connected to [facility]. 1)From [address] turn on to [address] and go to the 3rd entrance  (Heart and Vascular entrance) on the right. 2)Look to the right for Heart &Vascular Parking Garage. 3)A code for the entrance is required for Dec is 5555.   4)Take the elevators to the 1st floor. Registration is in the room with the glass walls at the end of the hallway.  If you have any trouble parking or locating the clinic, please don't hesitate to call [phone].

## 2021-06-15 NOTE — Anesthesia Preprocedure Evaluation (Addendum)
Anesthesia Evaluation  Patient identified by MRN, date of birth, ID band Patient awake    Reviewed: Allergy & Precautions, NPO status , Patient's Chart, lab work & pertinent test results, reviewed documented beta blocker date and time   History of Anesthesia Complications Negative for: history of anesthetic complications  Airway Mallampati: II  TM Distance: >3 FB Neck ROM: Full    Dental  (+) Dental Advisory Given, Teeth Intact   Pulmonary neg pulmonary ROS,    breath sounds clear to auscultation       Cardiovascular + dysrhythmias Atrial Fibrillation  Rhythm:Irregular  1. Left ventricular ejection fraction, by estimation, is 65 to 70%. The  left ventricle has normal function. The left ventricle has no regional  wall motion abnormalities. Left ventricular diastolic function could not  be evaluated.  2. Right ventricular systolic function is normal. The right ventricular  size is normal.  3. Left atrial size was severely dilated.  4. Right atrial size was moderately dilated.  5. The mitral valve is grossly normal. Trivial mitral valve  regurgitation. No evidence of mitral stenosis.  6. The aortic valve is tricuspid. Aortic valve regurgitation is mild.  7. Aortic dilatation noted. There is mild dilatation of the ascending  aorta, measuring 40 mm.  8. The inferior vena cava is normal in size with greater than 50%  respiratory variability, suggesting right atrial pressure of 3 mmHg.  Notes Recorded by Nuala Alpha, LPN on 10/09/91 at 8:18 PM Informed the pt that per Dr [name] his nuclear stress test results showed no prior infarct and no ischemia (blockages).  Pt reports he feels great today with no cardiac complaints.  Pt verbalized understanding of results and pleased with this news. ------  Notes Recorded by Dorothy Spark, MD on 07/14/2014 at 12:19 PM Normal stress test, no prior infarct and no ischemia (blockages).  Please ask him how is he doing, thank you   Neuro/Psych  Headaches,  Neuromuscular disease negative psych ROS   GI/Hepatic GERD  ,  Endo/Other  negative endocrine ROS  Renal/GU Renal disease     Musculoskeletal negative musculoskeletal ROS (+)   Abdominal   Peds  Hematology eliquis   Anesthesia Other Findings   Reproductive/Obstetrics                             Anesthesia Physical Anesthesia Plan  ASA: 2  Anesthesia Plan: General   Post-op Pain Management:    Induction: Intravenous  PONV Risk Score and Plan: 2 and Ondansetron and Dexamethasone  Airway Management Planned: Oral ETT  Additional Equipment: None  Intra-op Plan:   Post-operative Plan: Extubation in OR  Informed Consent: I have reviewed the patients History and Physical, chart, labs and discussed the procedure including the risks, benefits and alternatives for the proposed anesthesia with the patient or authorized representative who has indicated his/her understanding and acceptance.     Dental advisory given  Plan Discussed with: CRNA and Anesthesiologist  Anesthesia Plan Comments:         Anesthesia Quick Evaluation

## 2021-06-16 ENCOUNTER — Encounter (HOSPITAL_COMMUNITY): Payer: Self-pay | Admitting: Internal Medicine

## 2021-06-16 NOTE — Anesthesia Postprocedure Evaluation (Signed)
Anesthesia Post Note  Patient: Jorge Kelly  Procedure(s) Performed: ATRIAL FIBRILLATION ABLATION     Patient location during evaluation: Cath Lab Anesthesia Type: General Level of consciousness: awake and alert Pain management: pain level controlled Vital Signs Assessment: post-procedure vital signs reviewed and stable Respiratory status: spontaneous breathing, nonlabored ventilation, respiratory function stable and patient connected to nasal cannula oxygen Cardiovascular status: blood pressure returned to baseline and stable Postop Assessment: no apparent nausea or vomiting Anesthetic complications: no   No notable events documented.  Last Vitals:  Vitals:   06/15/21 1536 06/15/21 1606  BP: 122/77 120/83  Pulse: 99 (!) 102  Resp: 17 (!) 22  Temp:    SpO2: 93% 93%    Last Pain:  Vitals:   06/15/21 1320  TempSrc:   PainSc: 0-No pain                 Idriss Quackenbush

## 2021-06-19 NOTE — PT/OT Therapy Note (Signed)
Name: Marcus Mcneil Age: 65 y.o.   Date of Service: 06/20/2021  Referring Physician: Duanne Guess, *   Date of Injury: 06/01/2021  Date Care Plan Established/Reviewed: 06/07/2021  Date Treatment Started: 06/07/2021  Date Care Plan Established/Reviewed No data was found  Date Treatment Started No data was found  (Historic) Date Care Plan Established/Reviewed No data was found  (Historic) Date Treatment Started No data was found   End of Certification Date: 09/04/2021  Sessions in Plan of Care: 16  Surgery Date: No data was found  MD Follow-up: No data was found  Medbridge Code: No data was found    Visit Count: 3   Diagnosis:   1. Cervicalgia        Subjective     Daily Subjective   Pt reports decreased intensity of radicular pain in shoulder and hand, but continues to hypothenar eminence of LUE.    Social Support/Occupation    Lives in: multiple level home    Lives with: spouse    Occupation: Education officer, environmental.    Precautions: No data was found  Allergies: Patient has no allergy information on record.    Functional Limitations (PLOF):   Patient is having difficulty sleeping more than 4 hours at night. (Patient was able to sleep 7-8 hours at night).   Patient is having difficulty changing lanes and backing up his car. (Patient was able to change lanes and back up his car).   Patient is having difficulty sitting for more than 30 minutes when watching television. (Patient was able to sit for 2 hours when watching television).      Outcome Measure   Tool Used/Details: Foto    Objective            Initial Evaluation Reference and/or Current Measurements(as dated):  Posture  Initial 06/07/21: FHP, increased thoracic kyphosis, B rounded shoulders.      Palpation   Initial 06/07/21:  Mod TTP suboccipitals and CT junction     Cervical AROM  Initial   06/20/21         Flexion 50           Extension 15 p!           Side Bend R 20 p!           Side Bend L 20 p!           Rotation R 35 p! To shoulder blade  50         Rotation L 45  p!  60         (blank fields were intentionally left blank)     Cervical Strength /5 Initial            Flexion (C1/2) 4-           Extension             Side Bend R (C3)             Side Bend L (C3)             Rotation R             Rotation L             DNF Lift (sec)             (blank fields were intentionally left blank)    Treatment     Therapeutic Exercises - Justified to address any of the following:  To develop strength, endurance, ROM and/or  flexibility.   Subjective obtained to guide treatment planning    supine on 1/2 sm FR series for AROM and mobility, 1 min ea:  -add: pec stretch  -dynamic pec BL Hor ABD  -swimmers  -snow angel  -add: X's  -goal post IR/ER       B SH rows CC level 7 x 10 reps then level 8 x 10 reps in staggered stance TC for scap retraction decreased UT activation    B SH ext at CC level 7 2 x 15 reps TC for scap retraction    Objective  measures taken and goals updated - see charts     Neuromuscular Re-Education - Justified to address any of the following:   Re-education of movement, balance, coordination, kinesthetic sense, posture and/or proprioception for sitting and/or standing activities.   Prone for cervical and scapular NMR  - Head lift c axial elongation 2 x 10 VC to decrease thoracic compensation  - B SH ext IR position 2# x 10 reps then ER position 2 x 10 reps TC for scap retraction  - B SH Hor ABD x 5 reps for form added 2# DB x 10 reps, dropped 1# x 10 reps TC for scap depression  - B SH flex (Y) 2 x 10 reps TC for LT  -Maintaining axial elongation with chintuck off edge of table x 30" and x 2 min    For scapular stability in WB  -Push ups on table (26") 2 x 10 reps   -Plank on table (26") c thoracic and cervical rotation x 10 reps to ea side      Home Exercises   Access Code: [email or web address]  URL: [email or web address]  Date: 06/07/2021  Prepared by: Alyse Low     Exercises  Seated Assisted Cervical Rotation with Towel - 1 x daily - 7 x weekly - 3 sets - 10  reps  Supine Thoracic Mobilization Foam Roll Vertical - 1 x daily - 7 x weekly - hold     ---      Flowsheet Row ---   Total Time    Timed Minutes 40 minutes   Total Time 40 minutes            Assessment   Today's session worked to progress scapular and DNF motor control for postural strengthening. Cinch exhibits increased ability to lift and hold head in prone compared to supine performance at the last session. Pt demonstrates improved BL cervical rotation and reports no pain, continues to lack R>L objectively. Pt observed to have minimal UT early lifting, shoulder extension > rows. Pt would benefit from continued skilled PT to progress to lifting and carrying activities to tolerance.    Plan   Avoid prolonged stretching because of distal nerve symptoms.   Add lifting and carrying    Goals      Goal 1: Patient will demonstrate independence in prescribed HEP with proper form, sets and reps for safe discharge to an independent program.     Access Code: [email or web address]  URL: [email or web address]  Date: 06/07/2021  Prepared by: Alyse Low    Exercises  Seated Assisted Cervical Rotation with Towel - 1 x daily - 7 x weekly - 3 sets - 10 reps  Supine Thoracic Mobilization Foam Roll Vertical - 1 x daily - 7 x weekly - hold     Sessions: 16      Goal 2: Patient will be able to sleep 7-8 hours without pain or discomfort.  06/20/21: Met. Per pt subjective. JE   Sessions: 16      Goal 3: Increase active cervical rotation to 60 degrees for increased visual field to allow safe changing lanes and backing up vehicle while driving.  06/20/21: Progressing. See chart. JE   Sessions: 16      Goal 4: Increase longus colli strength to a 5/5 for maintenance of proper sitting posture needed for television use > 2 hours.   Sessions: [address] [place]

## 2021-06-20 ENCOUNTER — Inpatient Hospital Stay: Payer: No Typology Code available for payment source | Attending: Orthopaedic Surgery | Admitting: Professional

## 2021-06-20 DIAGNOSIS — M542 Cervicalgia: Secondary | ICD-10-CM | POA: Insufficient documentation

## 2021-06-22 ENCOUNTER — Inpatient Hospital Stay
Payer: No Typology Code available for payment source | Attending: Orthopaedic Surgery | Admitting: Rehabilitative and Restorative Service Providers"

## 2021-06-22 ENCOUNTER — Inpatient Hospital Stay: Payer: No Typology Code available for payment source | Admitting: Rehabilitative and Restorative Service Providers"

## 2021-06-22 DIAGNOSIS — M542 Cervicalgia: Secondary | ICD-10-CM | POA: Insufficient documentation

## 2021-06-22 NOTE — PT/OT Therapy Note (Signed)
Name: Marcus Mcneil Age: 65 y.o.   Date of Service: 06/22/2021  Referring Physician: Duanne Guess, *   Date of Injury: 06/01/2021  Date Care Plan Established/Reviewed: 06/07/2021  Date Treatment Started: 06/07/2021  Date Care Plan Established/Reviewed No data was found  Date Treatment Started No data was found  (Historic) Date Care Plan Established/Reviewed No data was found  (Historic) Date Treatment Started No data was found   End of Certification Date: 09/04/2021  Sessions in Plan of Care: 16  Surgery Date: No data was found  MD Follow-up: No data was found  Medbridge Code: No data was found    Visit Count: 4   Diagnosis:   1. Cervicalgia        Subjective     Daily Subjective   Pt states that his neck is feeling better. He notes that the tightness to his L shoulder blade has not been prevalent for 7 days and that the numbness to his L hand is reduced.     Social Support/Occupation    Lives in: multiple level home    Lives with: spouse    Occupation: Education officer, environmental.    Precautions: No data was found  Allergies: Patient has no allergy information on record.    Functional Limitations (PLOF):   Patient is having difficulty sleeping more than 4 hours at night. (Patient was able to sleep 7-8 hours at night).   Patient is having difficulty changing lanes and backing up his car. (Patient was able to change lanes and back up his car).   Patient is having difficulty sitting for more than 30 minutes when watching television. (Patient was able to sit for 2 hours when watching television).      Outcome Measure   Tool Used/Details: Foto    Objective            Initial Evaluation Reference and/or Current Measurements(as dated):  Posture  Initial 06/07/21: FHP, increased thoracic kyphosis, B rounded shoulders.      Palpation   Initial 06/07/21:  Mod TTP suboccipitals and CT junction     Cervical AROM  Initial   06/20/21         Flexion 50           Extension 15 p!           Side Bend R 20 p!           Side Bend L 20 p!            Rotation R 35 p! To shoulder blade  50         Rotation L 45 p!  60         (blank fields were intentionally left blank)     Cervical Strength /5 Initial            Flexion (C1/2) 4-           Extension             Side Bend R (C3)             Side Bend L (C3)             Rotation R             Rotation L             DNF Lift (sec)             (blank fields were intentionally left blank)    Treatment  Therapeutic Exercises - Justified to address any of the following:  To develop strength, endurance, ROM and/or flexibility.   B cervical towel rotation 30 x on each side with verbal cuing on correct hand placement     B 1st rib mobilizations 30 x with verbal cuing on correct cervical spine sequencing     B sidelying thoracic rotation 1 min x 2 on each side with verbal cuing to avoid going into increased lumbar rotation     Neuromuscular Re-Education - Justified to address any of the following:   Re-education of movement, balance, coordination, kinesthetic sense, posture and/or proprioception for sitting and/or standing activities.   CC rows and extensions 3 x 10 at level 7 and level 5 with verbal cuing to engage scapular musculature     B ER with red theraband 3 x 10 with verbal cuing to incorporate diaphragmatic breathing     Serratus Wall slides 3 x 10 with verbal cuing to engage serratus anterior muscles     Manual Therapy - Justified to address any of the following:    Mobilization of joints and soft tissues, manipulation, manual lymphatic drainage, and/or manual traction.    Grade III/IV extensions paivms to B C1-C2 and C6-C7 in sitting    Therapeutic Activity - Justified to address the following:  Dynamic activities to improve functional performance.  Sleep positions reviewed in an A-B-A pattern.     Home Exercises   Access Code: [email or web address]  URL: [email or web address]  Date: 06/07/2021  Prepared by: Alyse Low     Exercises  Seated Assisted Cervical Rotation with Towel - 1 x daily - 7 x weekly -  3 sets - 10 reps  Supine Thoracic Mobilization Foam Roll Vertical - 1 x daily - 7 x weekly - hold     ---      Flowsheet Row ---   Total Time    Timed Minutes 38 minutes   Total Time 38 minutes            Assessment   Patient is noted to have made marked improvements in cervical ROM since first starting physical therapy. He also has less distal symptoms and improved L shoulder strength. Patient was educated on how he can be ready to be discharged if he is reassessed and is making gains towards his functional goals.     Plan   D/c patient in a few session. Avoid stretching of L UE because of distal symptoms.     Goals      Goal 1: Patient will demonstrate independence in prescribed HEP with proper form, sets and reps for safe discharge to an independent program.     Access Code: [email or web address]  URL: [email or web address]  Date: 06/07/2021  Prepared by: Alyse Low    Exercises  Seated Assisted Cervical Rotation with Towel - 1 x daily - 7 x weekly - 3 sets - 10 reps  Supine Thoracic Mobilization Foam Roll Vertical - 1 x daily - 7 x weekly - hold     Sessions: 16      Goal 2: Patient will be able to sleep 7-8 hours without pain or discomfort.  06/20/21: Met. Per pt subjective. JE   Sessions: 16      Goal 3: Increase active cervical rotation to 60 degrees for increased visual field to allow safe changing lanes and backing up vehicle while driving.  06/20/21: Progressing. See chart. JE   Sessions: 16      Goal 4: Increase longus  colli strength to a 5/5 for maintenance of proper sitting posture needed for television use > 2 hours.   Sessions: 16                                  Francis Dowse, DPT

## 2021-06-24 ENCOUNTER — Ambulatory Visit
Admission: RE | Admit: 2021-06-24 | Discharge: 2021-06-24 | Disposition: A | Discharge status: Home / Self Care | Payer: No Typology Code available for payment source | Source: Ambulatory Visit | Attending: Orthopaedic Surgery | Admitting: Orthopaedic Surgery

## 2021-06-24 ENCOUNTER — Other Ambulatory Visit: Payer: Self-pay | Admitting: Orthopaedic Surgery

## 2021-06-24 DIAGNOSIS — M5412 Radiculopathy, cervical region: Secondary | ICD-10-CM | POA: Insufficient documentation

## 2021-06-27 ENCOUNTER — Inpatient Hospital Stay: Payer: No Typology Code available for payment source | Admitting: Rehabilitative and Restorative Service Providers"

## 2021-07-03 ENCOUNTER — Ambulatory Visit: Payer: No Typology Code available for payment source

## 2021-07-06 ENCOUNTER — Inpatient Hospital Stay
Payer: No Typology Code available for payment source | Attending: Orthopaedic Surgery | Admitting: Rehabilitative and Restorative Service Providers"

## 2021-07-06 DIAGNOSIS — M542 Cervicalgia: Secondary | ICD-10-CM | POA: Insufficient documentation

## 2021-07-06 NOTE — PT/OT Therapy Note (Addendum)
Name: Marcus Mcneil Age: 65 y.o.   Date of Service: 07/06/2021  Referring Physician: Duanne Guess, *   Date of Injury: 06/01/2021  Date Care Plan Established/Reviewed: 06/07/2021  Date Treatment Started: 06/07/2021  Date Care Plan Established/Reviewed No data was found  Date Treatment Started No data was found  (Historic) Date Care Plan Established/Reviewed No data was found  (Historic) Date Treatment Started No data was found   End of Certification Date: 09/04/2021  Sessions in Plan of Care: 16  Surgery Date: No data was found  MD Follow-up: No data was found  Medbridge Code: No data was found    Visit Count: 5   Diagnosis:   1. Cervicalgia      Discontinuation of Therapy Services    KEANEN DOHSE did not complete prescribed physical therapy visits.      Status is unknown at this time, physical therapy has been discontinued and patient has been discharged from care.  The last therapy note is below for review.    Please feel free to contact me with any questions regarding the care of Marcus Mcneil.    Sincerely,    Francis Dowse, DPT        08/15/2021         Subjective     Daily Subjective   Pt reports that his neck is feeling better since first starting physical therapy. He notes that his neck feels looser.     Social Support/Occupation    Lives in: multiple level home    Lives with: spouse    Occupation: Education officer, environmental.    Precautions: No data was found  Allergies: Patient has no allergy information on record.    Functional Limitations (PLOF):   Patient is having difficulty sleeping more than 4 hours at night. (Patient was able to sleep 7-8 hours at night).   Patient is having difficulty changing lanes and backing up his car. (Patient was able to change lanes and back up his car).   Patient is having difficulty sitting for more than 30 minutes when watching television. (Patient was able to sit for 2 hours when watching television).      Outcome Measure   Tool Used/Details: Foto    Objective            Initial  Evaluation Reference and/or Current Measurements(as dated):  Posture  Initial 06/07/21: FHP, increased thoracic kyphosis, B rounded shoulders.      Palpation   Initial 06/07/21:  Mod TTP suboccipitals and CT junction     Cervical AROM  Initial   06/20/21  07/06/2021       Flexion 50           Extension 15 p!           Side Bend R 20 p!           Side Bend L 20 p!           Rotation R 35 p! To shoulder blade  50  57  degrees       Rotation L 45 p!  60  65 degrees       (blank fields were intentionally left blank)     Cervical Strength /5 Initial   07/06/2021         Flexion (C1/2) 4-  4+         Extension             Side Bend R (C3)  Side Bend L (C3)             Rotation R             Rotation L             DNF Lift (sec)             (blank fields were intentionally left blank)    Treatment     Therapeutic Exercises - Justified to address any of the following:  To develop strength, endurance, ROM and/or flexibility.   B cervical towel rotation 30 x on each side with verbal cuing on correct hand placement     B 1st rib mobilizations 30 x with verbal cuing on correct cervical spine sequencing     B sidelying thoracic rotation 1 min x 2 on each side with verbal cuing to avoid going into increased lumbar rotation     Neuromuscular Re-Education - Justified to address any of the following:   Re-education of movement, balance, coordination, kinesthetic sense, posture and/or proprioception for sitting and/or standing activities.   CC rows and extensions 3 x 10 at level 7 and level 5 with verbal cuing to engage scapular musculature     B ER with green theraband 3 x 10 with verbal cuing to incorporate diaphragmatic breathing     Serratus Wall slides 3 x 10 with verbal cuing to engage serratus anterior muscles     Manual Therapy - Justified to address any of the following:    Mobilization of joints and soft tissues, manipulation, manual lymphatic drainage, and/or manual traction.    Grade III/IV extensions paivms  to B C1-C2 and C6-C7 in sitting    Therapeutic Activity - Justified to address the following:  Dynamic activities to improve functional performance.  Sleep positions reviewed in an A-B-A pattern.     Home Exercises   Access Code: [email or web address]  URL: [email or web address]  Date: 06/07/2021  Prepared by: Alyse Low     Exercises  Seated Assisted Cervical Rotation with Towel - 1 x daily - 7 x weekly - 3 sets - 10 reps  Supine Thoracic Mobilization Foam Roll Vertical - 1 x daily - 7 x weekly - hold     ---      Flowsheet Row ---   Total Time    Timed Minutes 38 minutes   Total Time 38 minutes            Assessment   Patient has made significant gains in cervical spine ROM and strength. Patient was instructed on continuing his exercises even when he is done with physical;l therapy. Patient was given an updated HEP at the end of the session. Patient was given Foto at the end of the session to monitor the gains made in physical therapy.   Plan   D/c patient today.     Goals      Goal 1: Patient will demonstrate independence in prescribed HEP with proper form, sets and reps for safe discharge to an independent program.     Access Code: [email or web address]  URL: [email or web address]  Date: 06/07/2021  Prepared by: Alyse Low    Exercises  Seated Assisted Cervical Rotation with Towel - 1 x daily - 7 x weekly - 3 sets - 10 reps  Supine Thoracic Mobilization Foam Roll Vertical - 1 x daily - 7 x weekly - hold     Sessions: 16      Goal 2: Patient will be able to sleep 7-8  hours without pain or discomfort.  06/20/21: Met. Per pt subjective. JE   Sessions: 16      Goal 3: Increase active cervical rotation to 60 degrees for increased visual field to allow safe changing lanes and backing up vehicle while driving.  06/20/21: Progressing. See chart. JE     65 degrees with L rotation.   57 degrees with R rotation.   07/06/2021 MJM   Sessions: 16      Goal 4: Increase longus colli strength to a 5/5 for  maintenance of proper sitting posture needed for television use > 2 hours.     4+/5. 07/06/2021 MJM   Sessions: 16                                  Francis Dowse, DPT

## 2021-07-11 ENCOUNTER — Inpatient Hospital Stay: Payer: No Typology Code available for payment source | Admitting: Rehabilitative and Restorative Service Providers"

## 2021-07-12 ENCOUNTER — Encounter: Payer: Self-pay | Admitting: Internal Medicine

## 2021-07-13 ENCOUNTER — Encounter (HOSPITAL_COMMUNITY): Payer: Self-pay | Admitting: Physician Assistant

## 2021-07-13 ENCOUNTER — Ambulatory Visit (HOSPITAL_COMMUNITY)
Admission: RE | Admit: 2021-07-13 | Discharge: 2021-07-13 | Disposition: A | Discharge status: Home / Self Care | Payer: BC Managed Care – PPO | Source: Ambulatory Visit | Attending: Physician Assistant | Admitting: Physician Assistant

## 2021-07-13 ENCOUNTER — Other Ambulatory Visit: Payer: Self-pay

## 2021-07-13 DIAGNOSIS — Z79899 Other long term (current) drug therapy: Secondary | ICD-10-CM | POA: Diagnosis not present

## 2021-07-13 DIAGNOSIS — I2510 Atherosclerotic heart disease of native coronary artery without angina pectoris: Secondary | ICD-10-CM | POA: Diagnosis not present

## 2021-07-13 DIAGNOSIS — Z7182 Exercise counseling: Secondary | ICD-10-CM | POA: Diagnosis not present

## 2021-07-13 DIAGNOSIS — I4819 Other persistent atrial fibrillation: Secondary | ICD-10-CM | POA: Diagnosis not present

## 2021-07-13 DIAGNOSIS — I10 Essential (primary) hypertension: Secondary | ICD-10-CM | POA: Diagnosis not present

## 2021-07-13 DIAGNOSIS — Z7901 Long term (current) use of anticoagulants: Secondary | ICD-10-CM | POA: Insufficient documentation

## 2021-07-13 DIAGNOSIS — E669 Obesity, unspecified: Secondary | ICD-10-CM | POA: Diagnosis not present

## 2021-07-13 DIAGNOSIS — Z6831 Body mass index (BMI) 31.0-31.9, adult: Secondary | ICD-10-CM | POA: Diagnosis not present

## 2021-07-13 LAB — BASIC METABOLIC PANEL
Anion gap: 7 (ref 5–15)
BUN: 15 mg/dL (ref 8–23)
CO2: 21 mmol/L — ABNORMAL LOW (ref 22–32)
Calcium: 8.6 mg/dL — ABNORMAL LOW (ref 8.9–10.3)
Chloride: 113 mmol/L — ABNORMAL HIGH (ref 98–111)
Creatinine, Ser: 0.96 mg/dL (ref 0.61–1.24)
GFR, Estimated: >60 mL/min (ref >60)
Glucose, Bld: 139 mg/dL — ABNORMAL HIGH (ref 70–99)
Potassium: 3.8 mmol/L (ref 3.5–5.1)
Sodium: 141 mmol/L (ref 135–145)

## 2021-07-13 LAB — CBC
HCT: 47.1 % (ref 39.0–52.0)
Hemoglobin: 16 g/dL (ref 13.0–17.0)
MCH: 31.6 pg (ref 26.0–34.0)
MCHC: 34 g/dL (ref 30.0–36.0)
MCV: 92.9 fL (ref 80.0–100.0)
Platelets: 158 10*3/uL (ref 150–400)
RBC: 5.07 MIL/uL (ref 4.22–5.81)
RDW: 12.6 % (ref 11.5–15.5)
WBC: 6.4 10*3/uL (ref 4.0–10.5)
nRBC: 0 % (ref 0.0–0.2)

## 2021-07-13 MED ORDER — DILTIAZEM HCL ER COATED BEADS 120 MG PO CP24: 120.0000 mg | ORAL_CAPSULE | Freq: Every day | ORAL | 1 refills | Status: DC

## 2021-07-13 NOTE — H&P (View-Only) (Signed)
Primary Care Physician: [name], MD Primary Cardiologist: Dr Johney Frame  Primary Electrophysiologist: Dr [name] Referring Physician: [facility] T[facility]    Jorge Kelly is a 65 y.o. male with a history of HTN and atrial fibrillation who presents for consultation in the [facility].  The patient was initially diagnosed with atrial fibrillation in 2015. He has been maintained on flecainide. Patient is on Eliquis for a CHADS2VASC score of 1. Patient had done well with little afib until 12/2020 when he was noted to be in afib at his follow up. Patient admits he ran out of his medication. He resumed the medication and a heart monitor was placed to evaluate if he was persistent vs paroxysmal. The monitor showed 100 % afib burden. He is symptomatic with fatigue, palpitations, and dizziness. Patient underwent unsuccessful DCCV on 04/13/21. He was referred to Dr [name] and underwent afib ablation on 06/15/21.  On follow up today, patient states he "felt like a new man" right after ablation. However, at the end of last week he started having paroxysms of heart racing and this has been persistent since 07/10/21. There were no specific triggers that he could identify. He denies CP, swallowing pain, or groin issues.   Today, he denies symptoms of chest pain, shortness of breath, orthopnea, PND, lower extremity edema, presyncope, syncope, snoring, daytime somnolence, bleeding, or neurologic sequela. The patient is tolerating medications without difficulties and is otherwise without complaint today.    Atrial Fibrillation Risk Factors:  he does not have symptoms or diagnosis of sleep apnea. he does not have a history of rheumatic fever. he does not have a history of alcohol use. The patient does not have a history of early familial atrial fibrillation or other arrhythmias.  he has a BMI of Body mass index is 31.56 kg/m.Marland Kitchen Filed Weights   07/13/21 0826  Weight: 111.5 kg     Family History  Problem Relation Age of Onset   Lymphoma Mother    Atrial fibrillation Mother        pacemaker in 71s   Leukemia Father        passed from [place] Sister        treated, cancer free   Diabetes type II Sister    Heart disease Maternal Uncle        mother lost 6 brothers to heart disease in 85s     Atrial Fibrillation Management history:  Previous antiarrhythmic drugs: Multaq, flecainide  Previous cardioversions: 2015, 2016 Previous ablations: 06/15/21 CHADS2VASC score: 1 Anticoagulation history: Eliquis   Past Medical History:  Diagnosis Date   Actinic keratosis 10/24/2009   ALLERGIC RHINITIS 05/11/2007   ELEVATED BLOOD PRESSURE WITHOUT DIAGNOSIS OF HYPERTENSION 11/12/2007   Dr. Debara Pickett mentioned HTN on problem list   Esophageal stricture    treated with protonix   GERD 05/11/2007   GI bleeding    20 years ago   Kidney stone    Melanoma ([place]) 01/2014   remoted from forehead   MIGRAINE HEADACHE 05/11/2007   Overweight    Persistent atrial fibrillation ([place]) 05/2014   Snores    Past Surgical History:  Procedure Laterality Date   ANKLE FRACTURE SURGERY  2010   x2-left   ATRIAL FIBRILLATION ABLATION N/A 06/15/2021   Procedure: ATRIAL FIBRILLATION ABLATION;  Surgeon: [name], MD;  Location: [facility];  Service: Cardiovascular;  Laterality: N/A;   CARDIOVERSION N/A 06/23/2014   CARDIOVERSION N/A 09/09/2014   CARDIOVERSION  N/A 04/13/2021   Procedure: CARDIOVERSION;  Surgeon: [name], MD;  Location: [facility];  Service: Cardiovascular;  Laterality: N/A;   malignant melanoma     removed from forehead in June 2015   STRABISMUS SURGERY Right 05/21/2014   Dr. Annamaria Boots   UPPER GI ENDOSCOPY     stricture, just PPI    Current Outpatient Medications  Medication Sig Dispense Refill   acetaminophen (TYLENOL) 500 MG tablet Take 1,000 mg by mouth every 8 (eight) hours as needed for moderate pain or headache.     apixaban (ELIQUIS)  5 MG TABS tablet Take 1 tablet (5 mg total) by mouth 2 (two) times daily. 180 tablet 3   diltiazem (CARDIZEM CD) 120 MG 24 hr capsule TAKE 1 CAPSULE BY MOUTH EVERY DAY 90 capsule 3   diltiazem (CARDIZEM) 60 MG tablet Take 1 tablet (60 mg total) by mouth daily as needed. For palpitations. 30 tablet 8   fexofenadine (ALLEGRA) 180 MG tablet Take 180 mg by mouth daily as needed for allergies or rhinitis.     pantoprazole (PROTONIX) 40 MG tablet Take 1 tablet (40 mg total) by mouth daily. 45 tablet 0   rizatriptan (MAXALT-MLT) 10 MG disintegrating tablet Take daily as needed for migraine May repeat in 2 hours if needed x1 9 tablet 2   rosuvastatin (CRESTOR) 20 MG tablet Take 1 tablet (20 mg total) by mouth daily. 90 tablet 3   No current facility-administered medications for this encounter.    No Known Allergies  Social History   Socioeconomic History   Marital status: Married    Spouse name: Not on file   Number of children: Not on file   Years of education: Not on file   Highest education level: Not on file  Occupational History   Occupation: Attorney  Tobacco Use   Smoking status: Never   Smokeless tobacco: Never  Vaping Use   Vaping Use: Never used  Substance and Sexual Activity   Alcohol use: No    Alcohol/week: 0.0 standard drinks   Drug use: No   Sexual activity: Not on file  Other Topics Concern   Not on file  Social History Narrative   Lives in [place] with wife (wife patient at [employer]).  2 sons.- 28 [place] at [employer] and [place] at [employer] in [place]  Wife with pregnancy at 51 and 39.       Trial lawyer. Criminal defense      Hobbies: sails usually in [place]    Social Determinants of Health   Financial Resource Strain: Not on file  Food Insecurity: Not on file  Transportation Needs: Not on file  Physical Activity: Not on file  Stress: Not on file  Social Connections: Not on file  Intimate Partner Violence: Not on file     ROS- All systems are  reviewed and negative except as per the HPI above.  Physical Exam: Vitals:   07/13/21 0826  BP: 124/70  Pulse: (!) 124  Weight: 111.5 kg  Height: 6\' 2"  (1.88 m)    GEN- The patient is a well appearing obese male, alert and oriented x 3 today.   HEENT-head normocephalic, atraumatic, sclera clear, conjunctiva pink, hearing intact, trachea midline. Lungs- Clear to ausculation bilaterally, normal work of breathing Heart- irregular rate and rhythm, no murmurs, rubs or gallops  GI- soft, NT, ND, + BS Extremities- no clubbing, cyanosis, or edema MS- no significant deformity or atrophy Skin- no rash or lesion Psych- euthymic  mood, full affect Neuro- strength and sensation are intact   Wt Readings from Last 3 Encounters:  07/13/21 111.5 kg  06/15/21 106.6 kg  05/10/21 110.2 kg    EKG today demonstrates  Afib with RVR Vent. rate 124 BPM PR interval * ms QRS duration 96 ms QT/QTcB 334/479 ms  Echo 03/28/21 demonstrated   1. Left ventricular ejection fraction, by estimation, is 65 to 70%. The left ventricle has normal function. The left ventricle has no regional wall motion abnormalities. Left ventricular diastolic function could not be evaluated.   2. Right ventricular systolic function is normal. The right ventricular  size is normal.   3. Left atrial size was severely dilated.   4. Right atrial size was moderately dilated.   5. The mitral valve is grossly normal. Trivial mitral valve  regurgitation. No evidence of mitral stenosis.   6. The aortic valve is tricuspid. Aortic valve regurgitation is mild.   7. Aortic dilatation noted. There is mild dilatation of the ascending  aorta, measuring 40 mm.   8. The inferior vena cava is normal in size with greater than 50%  respiratory variability, suggesting right atrial pressure of 3 mmHg.   Comparison(s): Prior images unable to be directly viewed, comparison made  by report only.   Conclusion(s)/Recommendation(s): Otherwise normal  echocardiogram, with minor abnormalities described in the report. Mild enlargement of ascending aorta, previously noted as normal. Consider repeat imaging in 6-12 months.   Epic records are reviewed at length today  CHA2DS2-VASc Score = 1  The patient's score is based upon: CHF History: 0 HTN History: 1 Diabetes History: 0 Stroke History: 0 Vascular Disease History: 0 Age Score: 0 Gender Score: 0      ASSESSMENT AND PLAN: 1. Persistent Atrial Fibrillation (ICD10:  I48.19) The patient's CHA2DS2-VASc score is 1, indicating a 0.6% annual risk of stroke.   S/p afib ablation 06/15/21 Patient now appears persistent in afib. Reassured patient that some afib post ablation is not uncommon.  Will arrange for DCCV.  Increase diltiazem to 120 mg BID with 60 mg PRN for heart racing Continue Eliquis 5 mg BID with no missed doses for 3 months post ablation.   2. Obesity Body mass index is 31.56 kg/m. Lifestyle modification was discussed and encouraged including regular physical activity and weight reduction.  3. CAD Calcium score 947 On statin No anginal symptoms.   Follow up in the AF clinic 1-2 weeks post DCCV.    [facility] [address] [place], [place] [address] [phone] 07/13/2021 8:57 AM

## 2021-07-13 NOTE — Progress Notes (Signed)
Primary Care Physician: [name], MD Primary Cardiologist: Dr Johney Frame  Primary Electrophysiologist: Dr [name] Referring Physician: [facility] Triage    Jorge Kelly is a 65 y.o. male with a history of HTN and atrial fibrillation who presents for consultation in the [facility].  The patient was initially diagnosed with atrial fibrillation in 2015. He has been maintained on flecainide. Patient is on Eliquis for a CHADS2VASC score of 1. Patient had done well with little afib until 12/2020 when he was noted to be in afib at his follow up. Patient admits he ran out of his medication. He resumed the medication and a heart monitor was placed to evaluate if he was persistent vs paroxysmal. The monitor showed 100 % afib burden. He is symptomatic with fatigue, palpitations, and dizziness. Patient underwent unsuccessful DCCV on 04/13/21. He was referred to Dr [name] and underwent afib ablation on 06/15/21.  On follow up today, patient states he "felt like a new man" right after ablation. However, at the end of last week he started having paroxysms of heart racing and this has been persistent since 07/10/21. There were no specific triggers that he could identify. He denies CP, swallowing pain, or groin issues.   Today, he denies symptoms of chest pain, shortness of breath, orthopnea, PND, lower extremity edema, presyncope, syncope, snoring, daytime somnolence, bleeding, or neurologic sequela. The patient is tolerating medications without difficulties and is otherwise without complaint today.    Atrial Fibrillation Risk Factors:  he does not have symptoms or diagnosis of sleep apnea. he does not have a history of rheumatic fever. he does not have a history of alcohol use. The patient does not have a history of early familial atrial fibrillation or other arrhythmias.  he has a BMI of Body mass index is 31.56 kg/m.Marland Kitchen Filed Weights   07/13/21 0826  Weight: 111.5 kg     Family History  Problem Relation Age of Onset   Lymphoma Mother    Atrial fibrillation Mother        pacemaker in 57s   Leukemia Father        passed from [place] Sister        treated, cancer free   Diabetes type II Sister    Heart disease Maternal Uncle        mother lost 6 brothers to heart disease in 42s     Atrial Fibrillation Management history:  Previous antiarrhythmic drugs: Multaq, flecainide  Previous cardioversions: 2015, 2016 Previous ablations: 06/15/21 CHADS2VASC score: 1 Anticoagulation history: Eliquis   Past Medical History:  Diagnosis Date   Actinic keratosis 10/24/2009   ALLERGIC RHINITIS 05/11/2007   ELEVATED BLOOD PRESSURE WITHOUT DIAGNOSIS OF HYPERTENSION 11/12/2007   Dr. Debara Pickett mentioned HTN on problem list   Esophageal stricture    treated with protonix   GERD 05/11/2007   GI bleeding    20 years ago   Kidney stone    Melanoma ([place]) 01/2014   remoted from forehead   MIGRAINE HEADACHE 05/11/2007   Overweight    Persistent atrial fibrillation ([place]) 05/2014   Snores    Past Surgical History:  Procedure Laterality Date   ANKLE FRACTURE SURGERY  2010   x2-left   ATRIAL FIBRILLATION ABLATION N/A 06/15/2021   Procedure: ATRIAL FIBRILLATION ABLATION;  Surgeon: [name], MD;  Location: [facility];  Service: Cardiovascular;  Laterality: N/A;   CARDIOVERSION N/A 06/23/2014   CARDIOVERSION N/A 09/09/2014   CARDIOVERSION  N/A 04/13/2021   Procedure: CARDIOVERSION;  Surgeon: [name], MD;  Location: [facility];  Service: Cardiovascular;  Laterality: N/A;   malignant melanoma     removed from forehead in June 2015   STRABISMUS SURGERY Right 05/21/2014   Dr. Annamaria Boots   UPPER GI ENDOSCOPY     stricture, just PPI    Current Outpatient Medications  Medication Sig Dispense Refill   acetaminophen (TYLENOL) 500 MG tablet Take 1,000 mg by mouth every 8 (eight) hours as needed for moderate pain or headache.     apixaban (ELIQUIS)  5 MG TABS tablet Take 1 tablet (5 mg total) by mouth 2 (two) times daily. 180 tablet 3   diltiazem (CARDIZEM CD) 120 MG 24 hr capsule TAKE 1 CAPSULE BY MOUTH EVERY DAY 90 capsule 3   diltiazem (CARDIZEM) 60 MG tablet Take 1 tablet (60 mg total) by mouth daily as needed. For palpitations. 30 tablet 8   fexofenadine (ALLEGRA) 180 MG tablet Take 180 mg by mouth daily as needed for allergies or rhinitis.     pantoprazole (PROTONIX) 40 MG tablet Take 1 tablet (40 mg total) by mouth daily. 45 tablet 0   rizatriptan (MAXALT-MLT) 10 MG disintegrating tablet Take daily as needed for migraine May repeat in 2 hours if needed x1 9 tablet 2   rosuvastatin (CRESTOR) 20 MG tablet Take 1 tablet (20 mg total) by mouth daily. 90 tablet 3   No current facility-administered medications for this encounter.    No Known Allergies  Social History   Socioeconomic History   Marital status: Married    Spouse name: Not on file   Number of children: Not on file   Years of education: Not on file   Highest education level: Not on file  Occupational History   Occupation: Attorney  Tobacco Use   Smoking status: Never   Smokeless tobacco: Never  Vaping Use   Vaping Use: Never used  Substance and Sexual Activity   Alcohol use: No    Alcohol/week: 0.0 standard drinks   Drug use: No   Sexual activity: Not on file  Other Topics Concern   Not on file  Social History Narrative   Lives in [place] with wife (wife patient at [employer]).  2 sons.- 33 [place] at [employer] and [place] at [employer] in [place]  Wife with pregnancy at 70 and 39.       Trial lawyer. Criminal defense      Hobbies: sails usually in [place]    Social Determinants of Health   Financial Resource Strain: Not on file  Food Insecurity: Not on file  Transportation Needs: Not on file  Physical Activity: Not on file  Stress: Not on file  Social Connections: Not on file  Intimate Partner Violence: Not on file     ROS- All systems are  reviewed and negative except as per the HPI above.  Physical Exam: Vitals:   07/13/21 0826  BP: 124/70  Pulse: (!) 124  Weight: 111.5 kg  Height: 6\' 2"  (1.88 m)    GEN- The patient is a well appearing obese male, alert and oriented x 3 today.   HEENT-head normocephalic, atraumatic, sclera clear, conjunctiva pink, hearing intact, trachea midline. Lungs- Clear to ausculation bilaterally, normal work of breathing Heart- irregular rate and rhythm, no murmurs, rubs or gallops  GI- soft, NT, ND, + BS Extremities- no clubbing, cyanosis, or edema MS- no significant deformity or atrophy Skin- no rash or lesion Psych- euthymic  mood, full affect Neuro- strength and sensation are intact   Wt Readings from Last 3 Encounters:  07/13/21 111.5 kg  06/15/21 106.6 kg  05/10/21 110.2 kg    EKG today demonstrates  Afib with RVR Vent. rate 124 BPM PR interval * ms QRS duration 96 ms QT/QTcB 334/479 ms  Echo 03/28/21 demonstrated   1. Left ventricular ejection fraction, by estimation, is 65 to 70%. The left ventricle has normal function. The left ventricle has no regional wall motion abnormalities. Left ventricular diastolic function could not be evaluated.   2. Right ventricular systolic function is normal. The right ventricular  size is normal.   3. Left atrial size was severely dilated.   4. Right atrial size was moderately dilated.   5. The mitral valve is grossly normal. Trivial mitral valve  regurgitation. No evidence of mitral stenosis.   6. The aortic valve is tricuspid. Aortic valve regurgitation is mild.   7. Aortic dilatation noted. There is mild dilatation of the ascending  aorta, measuring 40 mm.   8. The inferior vena cava is normal in size with greater than 50%  respiratory variability, suggesting right atrial pressure of 3 mmHg.   Comparison(s): Prior images unable to be directly viewed, comparison made  by report only.   Conclusion(s)/Recommendation(s): Otherwise normal  echocardiogram, with minor abnormalities described in the report. Mild enlargement of ascending aorta, previously noted as normal. Consider repeat imaging in 6-12 months.   Epic records are reviewed at length today  CHA2DS2-VASc Score = 1  The patient's score is based upon: CHF History: 0 HTN History: 1 Diabetes History: 0 Stroke History: 0 Vascular Disease History: 0 Age Score: 0 Gender Score: 0      ASSESSMENT AND PLAN: 1. Persistent Atrial Fibrillation (ICD10:  I48.19) The patient's CHA2DS2-VASc score is 1, indicating a 0.6% annual risk of stroke.   S/p afib ablation 06/15/21 Patient now appears persistent in afib. Reassured patient that some afib post ablation is not uncommon.  Will arrange for DCCV.  Increase diltiazem to 120 mg BID with 60 mg PRN for heart racing Continue Eliquis 5 mg BID with no missed doses for 3 months post ablation.   2. Obesity Body mass index is 31.56 kg/m. Lifestyle modification was discussed and encouraged including regular physical activity and weight reduction.  3. CAD Calcium score 947 On statin No anginal symptoms.   Follow up in the AF clinic 1-2 weeks post DCCV.    [facility] [address] [place], [place] [address] [phone] 07/13/2021 8:57 AM

## 2021-07-13 NOTE — Patient Instructions (Signed)
Increase cardizem to 120mg  twice a day until day of cardioversion then reduce to 120mg  once a day  Cardioversion scheduled for Tuesday, December 27th  - Arrive at the [facility] and go to admitting at 730AM  - Do not eat or drink anything after midnight the night prior to your procedure.  - Take all your morning medication (except diabetic medications) with a sip of water prior to arrival.  - You will not be able to drive home after your procedure.  - Do NOT miss any doses of your blood thinner - if you should miss a dose please notify our office immediately.  - If you feel as if you go back into normal rhythm prior to scheduled cardioversion, please notify our office immediately. If your procedure is canceled in the cardioversion suite you will be charged a cancellation fee. Patients will be asked to: to mask in public and hand hygiene (no longer quarantine) in the 3 days prior to surgery, to report if any COVID-19-like illness or household contacts to COVID-19 to determine need for testing

## 2021-07-19 ENCOUNTER — Encounter (HOSPITAL_COMMUNITY): Payer: Self-pay | Admitting: Cardiovascular Disease

## 2021-07-19 NOTE — Progress Notes (Signed)
Attempted to obtain medical history via telephone, unable to reach at this time. I left a voicemail to return pre surgical testing department's phone call.  

## 2021-07-23 ENCOUNTER — Other Ambulatory Visit: Payer: Self-pay | Admitting: Internal Medicine

## 2021-07-24 ENCOUNTER — Other Ambulatory Visit: Payer: Self-pay | Admitting: *Deleted

## 2021-07-24 NOTE — Telephone Encounter (Signed)
Pt's pharmacy is requesting a refill on pantoprazole. Would Dr. Allred like to refill this medication? Please address 

## 2021-08-01 ENCOUNTER — Ambulatory Visit (HOSPITAL_COMMUNITY): Payer: BC Managed Care – PPO | Admitting: Anesthesiology

## 2021-08-01 ENCOUNTER — Encounter (HOSPITAL_COMMUNITY)
Admission: RE | Disposition: A | Discharge status: Home / Self Care | Payer: Self-pay | Source: Home / Self Care | Attending: Cardiovascular Disease

## 2021-08-01 ENCOUNTER — Ambulatory Visit (HOSPITAL_COMMUNITY)
Admission: RE | Admit: 2021-08-01 | Discharge: 2021-08-01 | Disposition: A | Discharge status: Home / Self Care | Payer: BC Managed Care – PPO | Attending: Cardiovascular Disease | Admitting: Cardiovascular Disease

## 2021-08-01 ENCOUNTER — Encounter (HOSPITAL_COMMUNITY): Payer: Self-pay | Admitting: Cardiovascular Disease

## 2021-08-01 DIAGNOSIS — Z6831 Body mass index (BMI) 31.0-31.9, adult: Secondary | ICD-10-CM | POA: Diagnosis not present

## 2021-08-01 DIAGNOSIS — E669 Obesity, unspecified: Secondary | ICD-10-CM | POA: Insufficient documentation

## 2021-08-01 DIAGNOSIS — I2510 Atherosclerotic heart disease of native coronary artery without angina pectoris: Secondary | ICD-10-CM | POA: Diagnosis not present

## 2021-08-01 DIAGNOSIS — I480 Paroxysmal atrial fibrillation: Secondary | ICD-10-CM | POA: Diagnosis not present

## 2021-08-01 DIAGNOSIS — I4891 Unspecified atrial fibrillation: Secondary | ICD-10-CM

## 2021-08-01 DIAGNOSIS — Z7901 Long term (current) use of anticoagulants: Secondary | ICD-10-CM | POA: Insufficient documentation

## 2021-08-01 DIAGNOSIS — I10 Essential (primary) hypertension: Secondary | ICD-10-CM | POA: Insufficient documentation

## 2021-08-01 DIAGNOSIS — I4819 Other persistent atrial fibrillation: Secondary | ICD-10-CM | POA: Insufficient documentation

## 2021-08-01 HISTORY — PX: CARDIOVERSION: SHX1299

## 2021-08-01 SURGERY — CARDIOVERSION
Anesthesia: General

## 2021-08-01 MED ORDER — LIDOCAINE 2% (20 MG/ML) 5 ML SYRINGE
INTRAMUSCULAR | Status: DC | PRN
  Administered 2021-08-01: 100 mg via INTRAVENOUS

## 2021-08-01 MED ORDER — PROPOFOL 10 MG/ML IV BOLUS
INTRAVENOUS | Status: DC | PRN
  Administered 2021-08-01: 80 mg via INTRAVENOUS

## 2021-08-01 MED ORDER — SODIUM CHLORIDE 0.9 % IV SOLN: INTRAVENOUS | Status: DC

## 2021-08-01 NOTE — CV Procedure (Signed)
° °  DIRECT CURRENT CARDIOVERSION  NAME:  Jorge Kelly    MRN: [id number] DOB:  07-02-1956    ADMIT DATE: 08/01/2021  Indication:  Symptomatic atrial fibrillation   Procedure Note:  The patient signed informed consent.  They have had had therapeutic anticoagulation with eliquis greater than 3 weeks.  Anesthesia was administered by Dr. Tobias Alexander.  Adequate airway was maintained throughout and vital followed per protocol.  They were cardioverted x 1 with 200J of biphasic synchronized energy.  They converted to NSR.  There were no apparent complications.  The patient had normal neuro status and respiratory status post procedure with vitals stable as recorded elsewhere.    Follow up: They will continue on current medical therapy and follow up with cardiology as scheduled.  [place] Bells T. Audie Box, MD, [facility]  [address] [place], [place] [address] [phone]  8:24 AM

## 2021-08-01 NOTE — Interval H&P Note (Signed)
History and Physical Interval Note:  08/01/2021 7:50 AM  Jorge Kelly  has presented today for surgery, with the diagnosis of AFIB.  The various methods of treatment have been discussed with the patient and family. After consideration of risks, benefits and other options for treatment, the patient has consented to  Procedure(s): CARDIOVERSION (N/A) as a surgical intervention.  The patient's history has been reviewed, patient examined, no change in status, stable for surgery.  I have reviewed the patient's chart and labs.  Questions were answered to the patient's satisfaction.    NPO for DCCV. On Eliquis >3 weeks. No missed doses.   Shared Decision Making/Informed Consent The risks (stroke, cardiac arrhythmias rarely resulting in the need for a temporary or permanent pacemaker, skin irritation or burns and complications associated with conscious sedation including aspiration, arrhythmia, respiratory failure and death), benefits (restoration of normal sinus rhythm) and alternatives of a direct current cardioversion were explained in detail to Jorge Kelly and he agrees to proceed.   Jorge Bells T. Audie Box, MD, [facility]  [address], [place], [place] [address] [phone]  7:50 AM

## 2021-08-01 NOTE — Transfer of Care (Signed)
Immediate Anesthesia Transfer of Care Note  Patient: Jorge Kelly  Procedure(s) Performed: CARDIOVERSION  Patient Location: PACU  Anesthesia Type:MAC  Level of Consciousness: drowsy and patient cooperative  Airway & Oxygen Therapy: Patient Spontanous Breathing  Post-op Assessment: Report given to RN and Post -op Vital signs reviewed and stable  Post vital signs: Reviewed and stable  Last Vitals:  Vitals Value Taken Time  BP    Temp    Pulse    Resp    SpO2      Last Pain:  Vitals:   08/01/21 0750  TempSrc: Temporal  PainSc: 0-No pain         Complications: No notable events documented.

## 2021-08-01 NOTE — Anesthesia Postprocedure Evaluation (Signed)
Anesthesia Post Note  Patient: Jorge Kelly  Procedure(s) Performed: CARDIOVERSION     Patient location during evaluation: PACU Anesthesia Type: General Level of consciousness: sedated Pain management: pain level controlled Vital Signs Assessment: post-procedure vital signs reviewed and stable Respiratory status: spontaneous breathing and respiratory function stable Cardiovascular status: stable Postop Assessment: no apparent nausea or vomiting Anesthetic complications: no   No notable events documented.  Last Vitals:  Vitals:   08/01/21 0750 08/01/21 0825  BP: (!) 162/98 (!) 139/92  Pulse: (!) 106 95  Resp: (!) 22 20  Temp: (!) 36.3 C 36.8 C  SpO2: 97% 93%    Last Pain:  Vitals:   08/01/21 0825  TempSrc: Temporal  PainSc: 0-No pain                 Cherisa Brucker DANIEL

## 2021-08-01 NOTE — Anesthesia Preprocedure Evaluation (Addendum)
Anesthesia Evaluation  Patient identified by MRN, date of birth, ID band Patient awake    Reviewed: Allergy & Precautions, NPO status , Patient's Chart, lab work & pertinent test results, reviewed documented beta blocker date and time   History of Anesthesia Complications Negative for: history of anesthetic complications  Airway Mallampati: II  TM Distance: >3 FB Neck ROM: Full    Dental no notable dental hx. (+) Dental Advisory Given   Pulmonary neg pulmonary ROS,    Pulmonary exam normal        Cardiovascular Normal cardiovascular exam+ dysrhythmias Atrial Fibrillation   1. Left ventricular ejection fraction, by estimation, is 65 to 70%. The  left ventricle has normal function. The left ventricle has no regional  wall motion abnormalities. Left ventricular diastolic function could not  be evaluated.  2. Right ventricular systolic function is normal. The right ventricular  size is normal.  3. Left atrial size was severely dilated.  4. Right atrial size was moderately dilated.  5. The mitral valve is grossly normal. Trivial mitral valve  regurgitation. No evidence of mitral stenosis.  6. The aortic valve is tricuspid. Aortic valve regurgitation is mild.  7. Aortic dilatation noted. There is mild dilatation of the ascending  aorta, measuring 40 mm.  8. The inferior vena cava is normal in size with greater than 50%  respiratory variability, suggesting right atrial pressure of 3 mmHg.  Notes Recorded by Nuala Alpha, LPN on 29/12/6211 at 0:86 PM Informed the pt that per Dr [name] his nuclear stress test results showed no prior infarct and no ischemia (blockages).  Pt reports he feels great today with no cardiac complaints.  Pt verbalized understanding of results and pleased with this news. ------  Notes Recorded by Dorothy Spark, MD on 07/14/2014 at 12:19 PM Normal stress test, no prior infarct and no ischemia  (blockages). Please ask him how is he doing, thank you   Neuro/Psych  Headaches,  Neuromuscular disease negative psych ROS   GI/Hepatic GERD  ,  Endo/Other  negative endocrine ROS  Renal/GU Renal disease     Musculoskeletal negative musculoskeletal ROS (+)   Abdominal   Peds  Hematology eliquis   Anesthesia Other Findings   Reproductive/Obstetrics                            Anesthesia Physical  Anesthesia Plan  ASA: 2  Anesthesia Plan: General   Post-op Pain Management: Minimal or no pain anticipated   Induction: Intravenous  PONV Risk Score and Plan: 2 and TIVA and Treatment may vary due to age or medical condition  Airway Management Planned: Mask  Additional Equipment: None  Intra-op Plan:   Post-operative Plan:   Informed Consent: I have reviewed the patients History and Physical, chart, labs and discussed the procedure including the risks, benefits and alternatives for the proposed anesthesia with the patient or authorized representative who has indicated his/her understanding and acceptance.     Dental advisory given  Plan Discussed with: Anesthesiologist and CRNA  Anesthesia Plan Comments:        Anesthesia Quick Evaluation

## 2021-08-02 ENCOUNTER — Encounter (HOSPITAL_COMMUNITY): Payer: Self-pay | Admitting: Cardiovascular Disease

## 2021-08-04 ENCOUNTER — Encounter: Payer: Self-pay | Admitting: Internal Medicine

## 2021-08-04 ENCOUNTER — Telehealth: Payer: BC Managed Care – PPO | Admitting: Physician Assistant

## 2021-08-04 ENCOUNTER — Encounter: Payer: Self-pay | Admitting: Family Medicine

## 2021-08-04 DIAGNOSIS — U071 COVID-19: Secondary | ICD-10-CM

## 2021-08-04 NOTE — Progress Notes (Signed)
Based on the information that you have shared in the e-Visit Questionnaire, we recommend that you convert this visit to a video visit in order for the provider to better assess what is going on.  The provider will be able to give you a more accurate diagnosis and treatment plan if we can more freely discuss your symptoms and with the addition of a virtual examination.   You are a candidate for antiviral treatment, but we do require a video visit for this.  If you convert to a video visit, we will bill your insurance (similar to an office visit) and you will not be charged for this e-Visit. You will be able to stay at home and speak with the first available [facility] advanced practice provider. The link to do a video visit is in the drop down Menu tab of your Welcome screen in [facility].

## 2021-08-04 NOTE — Telephone Encounter (Signed)
Noted  

## 2021-08-04 NOTE — Telephone Encounter (Signed)
Patient called in and I let patient know all of [facility] was full. Let patient know he could do a urgent care visit through [facility] patient agreed.

## 2021-08-06 ENCOUNTER — Telehealth: Payer: BC Managed Care – PPO | Admitting: Emergency Medicine

## 2021-08-06 DIAGNOSIS — U071 COVID-19: Secondary | ICD-10-CM

## 2021-08-06 MED ORDER — MOLNUPIRAVIR EUA 200MG CAPSULE: 4.0000 | ORAL_CAPSULE | Freq: Two times a day (BID) | ORAL | 0 refills | Status: AC

## 2021-08-06 NOTE — Progress Notes (Signed)
Virtual Visit Consent   Jorge Kelly, you are scheduled for a virtual visit with a [facility] provider today.     Just as with appointments in the office, your consent must be obtained to participate.  Your consent will be active for this visit and any virtual visit you may have with one of our providers in the next 365 days.     If you have a MyChart account, a copy of this consent can be sent to you electronically.  All virtual visits are billed to your insurance company just like a traditional visit in the office.    As this is a virtual visit, video technology does not allow for your provider to perform a traditional examination.  This may limit your provider's ability to fully assess your condition.  If your provider identifies any concerns that need to be evaluated in person or the need to arrange testing (such as labs, EKG, etc.), we will make arrangements to do so.     Although advances in technology are sophisticated, we cannot ensure that it will always work on either your end or our end.  If the connection with a video visit is poor, the visit may have to be switched to a telephone visit.  With either a video or telephone visit, we are not always able to ensure that we have a secure connection.     I need to obtain your verbal consent now.   Are you willing to proceed with your visit today? Yes   Jorge Kelly has provided verbal consent on 08/06/2021 for a virtual visit (video or telephone).   Lestine Box, [place]   Date: 08/06/2021 10:03 AM   Virtual Visit via Video Note   I, Lestine Box, connected with  Jorge Kelly  ([id number], 10-17-2005) on 08/06/21 at  9:45 AM EST by a video-enabled telemedicine application and verified that I am speaking with the correct person using two identifiers.  Location: Patient: Virtual Visit Location Patient: Home Provider: Virtual Visit Location Provider: Home Office   I discussed the limitations of evaluation and management by telemedicine  and the availability of in person appointments. The patient expressed understanding and agreed to proceed.    History of Present Illness: Jorge Kelly is a 66 y.o. who identifies as a male who was assigned male at birth, and is being seen today for congestion, runny nose, sore throat, and cough x 3 days.  Admits to positive covid test at home.  Has tried OTC medications without relief.  Symptoms are made worse with with time.  Denies previous symptoms in the past.   Denies  SOB, wheezing, chest pain, nausea, changes in bowel or bladder habits.    ROS: As per HPI.  All other pertinent ROS negative.       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   PAF (paroxysmal atrial fibrillation) ([place]) 05/21/2014   History of melanoma 01/04/2014   Actinic keratosis 10/24/2009   Migraine 05/11/2007   GERD 05/11/2007    Allergies: No Known Allergies Medications:  Current Outpatient Medications:    molnupiravir EUA (LAGEVRIO) 200 mg CAPS capsule, Take 4 capsules (800 mg total) by mouth 2 (two) times daily for 5 days., Disp: 40  capsule, Rfl: 0   acetaminophen (TYLENOL) 500 MG tablet, Take 1,000 mg by mouth every 8 (eight) hours as needed for moderate pain or headache., Disp: , Rfl:    apixaban (ELIQUIS) 5 MG TABS tablet, Take 1 tablet (5 mg total) by mouth 2 (two) times daily., Disp: 180 tablet, Rfl: 3   diltiazem (CARDIZEM CD) 120 MG 24 hr capsule, Take 1 capsule (120 mg total) by mouth daily. May take an extra tablet daily for breakthrough afib (Patient taking differently: Take 120 mg by mouth in the morning and at bedtime.), Disp: 120 capsule, Rfl: 1   fexofenadine (ALLEGRA) 180 MG tablet, Take 180 mg by mouth daily as needed for  allergies or rhinitis., Disp: , Rfl:    rizatriptan (MAXALT-MLT) 10 MG disintegrating tablet, Take daily as needed for migraine May repeat in 2 hours if needed x1, Disp: 9 tablet, Rfl: 2   rosuvastatin (CRESTOR) 20 MG tablet, Take 1 tablet (20 mg total) by mouth daily., Disp: 90 tablet, Rfl: 3  Observations/Objective: Patient is well-developed, well-nourished in no acute distress.  Resting comfortably at home. Fatigue appearing, but nontoxic  Head is normocephalic, atraumatic.  No labored breathing. Speaking in full sentences and tolerating own secretions.  Cough present Speech is clear and coherent with logical content.  Patient is alert and oriented at baseline.    Assessment and Plan: 1. COVID-19 virus infection   COVID test was positive You should remain isolated in your home for 5 days from symptom onset AND greater than 72 hours after symptoms resolution (absence of fever without the use of fever-reducing medication and improvement in respiratory symptoms), whichever is longer Get plenty of rest and push fluids Molnupiravir prescribed.  Take as directed  Use zyrtec for nasal congestion, runny nose, and/or sore throat Use flonase for nasal congestion and runny nose Use medications daily for symptom relief Use OTC medications like ibuprofen or tylenol as needed fever or pain Follow up with PCP in 1-2 days via phone or e-visit for recheck and to ensure symptoms are improving Call or go to the ED if you have any new or worsening symptoms such as fever, worsening cough, shortness of breath, chest tightness, chest pain, turning blue, changes in mental status, etc...    Follow Up Instructions: I discussed the assessment and treatment plan with the patient. The patient was provided an opportunity to ask questions and all were answered. The patient agreed with the plan and demonstrated an understanding of the instructions.  A copy of instructions were sent to the patient via MyChart unless  otherwise noted below.   The patient was advised to call back or seek an in-person evaluation if the symptoms worsen or if the condition fails to improve as anticipated.  Time:  I spent 10-15 minutes with the patient via telehealth technology discussing the above problems/concerns.    Lestine Box, PA-C

## 2021-08-06 NOTE — Patient Instructions (Signed)
Jorge Kelly, thank you for joining Lestine Box, PA-C for today's virtual visit.  While this provider is not your primary care provider (PCP), if your PCP is located in our provider database this encounter information will be shared with them immediately following your visit.  Consent: (Patient) Jorge Kelly provided verbal consent for this virtual visit at the beginning of the encounter.  Current Medications:  Current Outpatient Medications:    molnupiravir EUA (LAGEVRIO) 200 mg CAPS capsule, Take 4 capsules (800 mg total) by mouth 2 (two) times daily for 5 days., Disp: 40 capsule, Rfl: 0   acetaminophen (TYLENOL) 500 MG tablet, Take 1,000 mg by mouth every 8 (eight) hours as needed for moderate pain or headache., Disp: , Rfl:    apixaban (ELIQUIS) 5 MG TABS tablet, Take 1 tablet (5 mg total) by mouth 2 (two) times daily., Disp: 180 tablet, Rfl: 3   diltiazem (CARDIZEM CD) 120 MG 24 hr capsule, Take 1 capsule (120 mg total) by mouth daily. May take an extra tablet daily for breakthrough afib (Patient taking differently: Take 120 mg by mouth in the morning and at bedtime.), Disp: 120 capsule, Rfl: 1   fexofenadine (ALLEGRA) 180 MG tablet, Take 180 mg by mouth daily as needed for allergies or rhinitis., Disp: , Rfl:    rizatriptan (MAXALT-MLT) 10 MG disintegrating tablet, Take daily as needed for migraine May repeat in 2 hours if needed x1, Disp: 9 tablet, Rfl: 2   rosuvastatin (CRESTOR) 20 MG tablet, Take 1 tablet (20 mg total) by mouth daily., Disp: 90 tablet, Rfl: 3   Medications ordered in this encounter:  Meds ordered this encounter  Medications   molnupiravir EUA (LAGEVRIO) 200 mg CAPS capsule    Sig: Take 4 capsules (800 mg total) by mouth 2 (two) times daily for 5 days.    Dispense:  40 capsule    Refill:  0    Order Specific Question:   Supervising Provider    Answer:   Sabra Heck, [facility]     *If you need refills on other medications prior to your next appointment, please  contact your pharmacy*  Follow-Up: Call back or seek an in-person evaluation if the symptoms worsen or if the condition fails to improve as anticipated.  Other Instructions  COVID test was positive You should remain isolated in your home for 5 days from symptom onset AND greater than 72 hours after symptoms resolution (absence of fever without the use of fever-reducing medication and improvement in respiratory symptoms), whichever is longer Get plenty of rest and push fluids Molnupiravir prescribed.  Take as directed  Use zyrtec for nasal congestion, runny nose, and/or sore throat Use flonase for nasal congestion and runny nose Use medications daily for symptom relief Use OTC medications like ibuprofen or tylenol as needed fever or pain Follow up with PCP in 1-2 days via phone or e-visit for recheck and to ensure symptoms are improving Call or go to the ED if you have any new or worsening symptoms such as fever, worsening cough, shortness of breath, chest tightness, chest pain, turning blue, changes in mental status, etc...     If you have been instructed to have an in-person evaluation today at a local Urgent Care facility, please use the link below. It will take you to a list of all of our available [facility], including address, phone number and hours of operation. Please do not delay care.  [facility]  If you  or a family member do not have a primary care provider, use the link below to schedule a visit and establish care. When you choose a [facility] primary care physician or advanced practice provider, you gain a long-term partner in health. Find a Primary Care Provider  Learn more about [facility]'s in-office and virtual care options: [facility]

## 2021-08-09 ENCOUNTER — Ambulatory Visit (HOSPITAL_COMMUNITY): Payer: BC Managed Care – PPO | Admitting: Physician Assistant

## 2021-08-11 ENCOUNTER — Other Ambulatory Visit: Payer: Self-pay | Admitting: Family Medicine

## 2021-08-14 ENCOUNTER — Encounter: Payer: Self-pay | Admitting: Cardiology

## 2021-08-15 NOTE — Progress Notes (Deleted)
Cardiology Office Note:    Date:  08/15/2021   ID:  Jorge Kelly, DOB 1956-02-04, MRN [id number]  PCP:  Jorge Olp, MD   [facility] Providers Cardiologist:  Ena Dawley, MD     Referring MD: Jorge Olp, MD    History of Present Illness:    Jorge Kelly is a 66 y.o. male with a hx of pAfib and GERD who was previously followed by Dr. [name] who now presents to clinic for follow-up of his pAfib.  Patient has a long-standing history of atrial fibrillation for which he was started on flecainide. During last visit on 12/28/20 he was in Afib after running out of his medications for several days. We recommended repeat ECG to assess if out of rhythm after resuming flec, however, he is just now following up.  Was seen by me on 02/27/21 where he was symptomatic in Afib. We placed monitor which showed 100% burden of Afib. We set him up for DCCV on 04/13/21 where he received shock x2 at 200J without success. He subsequently saw Dr. [name] and underwent Afib ablation on 06/15/21. He was seen back in Afib clinic on 07/13/21 with recurrent Afib. He then underwent DCCV in 08/01/21 with return to NSR.  Today, ***  Past Medical History:  Diagnosis Date   Actinic keratosis 10/24/2009   ALLERGIC RHINITIS 05/11/2007   ELEVATED BLOOD PRESSURE WITHOUT DIAGNOSIS OF HYPERTENSION 11/12/2007   Dr. Debara Kelly mentioned HTN on problem list   Esophageal stricture    treated with protonix   GERD 05/11/2007   GI bleeding    20 years ago   Kidney stone    Melanoma ([place]) 01/2014   remoted from forehead   MIGRAINE HEADACHE 05/11/2007   Overweight    Persistent atrial fibrillation ([place]) 05/2014   Snores     Past Surgical History:  Procedure Laterality Date   ANKLE FRACTURE SURGERY  2010   x2-left   ATRIAL FIBRILLATION ABLATION N/A 06/15/2021   Procedure: ATRIAL FIBRILLATION ABLATION;  Surgeon: [name], MD;  Location: [facility];  Service: Cardiovascular;  Laterality: N/A;    CARDIOVERSION N/A 06/23/2014   CARDIOVERSION N/A 09/09/2014   CARDIOVERSION N/A 04/13/2021   Procedure: CARDIOVERSION;  Surgeon: Jorge Rile, MD;  Location: [facility];  Service: Cardiovascular;  Laterality: N/A;   CARDIOVERSION N/A 08/01/2021   Procedure: CARDIOVERSION;  Surgeon: Jorge Rile, MD;  Location: [facility];  Service: Cardiovascular;  Laterality: N/A;   malignant melanoma     removed from forehead in June 2015   STRABISMUS SURGERY Right 05/21/2014   Dr. Annamaria Boots   UPPER GI ENDOSCOPY     stricture, just PPI    Current Medications: No outpatient medications have been marked as taking for the 08/17/21 encounter (Appointment) with Freada Bergeron, MD.     Allergies:   Patient has no known allergies.   Social History   Socioeconomic History   Marital status: Married    Spouse name: Not on file   Number of children: Not on file   Years of education: Not on file   Highest education level: Not on file  Occupational History   Occupation: Attorney  Tobacco Use   Smoking status: Never   Smokeless tobacco: Never  Vaping Use   Vaping Use: Never used  Substance and Sexual Activity   Alcohol use: No    Alcohol/week: 0.0 standard drinks   Drug use: No   Sexual activity: Not on file  Other  Topics Concern   Not on file  Social History Narrative   Lives in [place] with wife (wife patient at [facility]).  2 sons.- 23 [place] at [employer] and [place] at [employer] in [place]  Wife with pregnancy at 2 and 39.       Trial lawyer. Criminal defense      Hobbies: sails usually in [place]    Social Determinants of Health   Financial Resource Strain: Not on file  Food Insecurity: Not on file  Transportation Needs: Not on file  Physical Activity: Not on file  Stress: Not on file  Social Connections: Not on file     Family History: The patient's family history includes Atrial fibrillation in his mother; Cancer in his sister; Diabetes type II in his  sister; Heart disease in his maternal uncle; Leukemia in his father; Lymphoma in his mother.  ROS:   Please see the history of present illness.    Review of Systems  Constitutional:  Positive for malaise/fatigue. Negative for chills and fever.  HENT:  Negative for sore throat.   Eyes:  Negative for blurred vision.  Respiratory:  Positive for shortness of breath.   Cardiovascular:  Positive for palpitations. Negative for chest pain, orthopnea, claudication, leg swelling and PND.  Gastrointestinal:  Negative for blood in stool, melena, nausea and vomiting.  Genitourinary:  Negative for hematuria.  Musculoskeletal:  Negative for falls.  Neurological:  Negative for dizziness and loss of consciousness.  Psychiatric/Behavioral:  Negative for substance abuse.    EKGs/Labs/Other Studies Reviewed:    The following studies were reviewed today: Echo 03/28/21 demonstrated   1. Left ventricular ejection fraction, by estimation, is 65 to 70%. The left ventricle has normal function. The left ventricle has no regional wall motion abnormalities. Left ventricular diastolic function could not be evaluated.   2. Right ventricular systolic function is normal. The right ventricular  size is normal.   3. Left atrial size was severely dilated.   4. Right atrial size was moderately dilated.   5. The mitral valve is grossly normal. Trivial mitral valve  regurgitation. No evidence of mitral stenosis.   6. The aortic valve is tricuspid. Aortic valve regurgitation is mild.   7. Aortic dilatation noted. There is mild dilatation of the ascending  aorta, measuring 40 mm.   8. The inferior vena cava is normal in size with greater than 50%  respiratory variability, suggesting right atrial pressure of 3 mmHg.   Comparison(s): Prior images unable to be directly viewed, comparison made  by report only.   Conclusion(s)/Recommendation(s): Otherwise normal echocardiogram, with  minor abnormalities described in the  report. Mild enlargement of ascending  aorta, previously noted as normal. Consider repeat imaging in 6-12 months.  TTE 2015: Study Conclusions   - Left ventricle: The cavity size was normal. There was mild    concentric hypertrophy. Systolic function was normal. The    estimated ejection fraction was in the range of 50% to 55%. Wall    motion was normal; there were no regional wall motion    abnormalities. The study was not technically sufficient to allow    evaluation of LV diastolic dysfunction due to atrial    fibrillation.  - Aortic valve: Structurally normal valve. There was mild    regurgitation.  - Ascending aorta: The ascending aorta was normal in size.  - Mitral valve: Structurally normal valve. There was mild    regurgitation.  - Left atrium: The atrium was moderately dilated.  -  Right ventricle: The cavity size was mildly dilated. Wall    thickness was normal. Systolic function was normal.  - Right atrium: The atrium was mildly dilated.  - Tricuspid valve: There was mild regurgitation.  - Pulmonic valve: There was no regurgitation.  - Pulmonary arteries: Systolic pressure was within the normal    range.  - Inferior vena cava: The vessel was dilated. The respirophasic    diameter changes were blunted (< 50%), consistent with elevated    central venous pressure.  - Pericardium, extracardiac: The pericardium was normal in    appearance.  Myoview 2015: Normal stress with no infarct or ischemia  Coronary Calcium Score 09/2019: FINDINGS: Non-cardiac: See separate report from [facility].   Ascending Aorta: Normal size, minimal calcifications.   Pericardium: Normal.   Coronary arteries: Normal origin.   IMPRESSION: Coronary calcium score of 947. This was 95 percentile for age and sex matched control.  EKG:  EKG is  ordered today.  The ekg ordered today demonstrates atrial fibrillation with HR 94  Recent Labs: 07/13/2021: BUN 15; Creatinine, Ser 0.96;  Hemoglobin 16.0; Platelets 158; Potassium 3.8; Sodium 141  Recent Lipid Panel    Component Value Date/Time   CHOL 103 10/16/2019 0830   TRIG 65 10/16/2019 0830   HDL 46 10/16/2019 0830   CHOLHDL 2.2 10/16/2019 0830   CHOLHDL 4 08/20/2019 1540   VLDL 16.2 08/20/2019 1540   LDLCALC 43 10/16/2019 0830   LDLDIRECT 96.1 09/02/2013 0839     Risk Assessment/Calculations:    CHA2DS2-VASc Score = 1  {This indicates a 0.6% annual risk of stroke. The patient's score is based upon:       Physical Exam:    VS:  There were no vitals taken for this visit.    Wt Readings from Last 3 Encounters:  08/01/21 245 lb (111.1 kg)  07/13/21 245 lb 12.8 oz (111.5 kg)  06/15/21 235 lb (106.6 kg)     GEN:  Comfortable, NAD HEENT: Normal NECK: No JVD; No carotid bruits CARDIAC: Irregularly irregular, no murmurs, rubs, gallops RESPIRATORY:  Clear to auscultation without rales, wheezing or rhonchi  ABDOMEN: Soft, non-tender, non-distended MUSCULOSKELETAL:  No edema; No deformity  SKIN: Warm and dry NEUROLOGIC:  Alert and oriented x 3 PSYCHIATRIC:  Normal affect   ASSESSMENT:    No diagnosis found.  PLAN:    In order of problems listed above:  #Paroxysmal afib: CHADs-vasc 1 for nonobstructive CAD.Had recurrent episodes of Afib in 02/2021 with unsuccessful DCCV in 03/2021. Now s/p Afib ablation on 06/15/21 with Dr. [name]. Following ablation, patient had recurrence of Afib and underwent repeat DCCV on 08/01/21. -Continue dilt 120mg  daily with 60mg  as needed for breakthrough -Continue apixaban 5mg  BID  #Non-obstructive CAD with coronary calcium score 947: Asymptomatic with no anginal symptoms. -Continue crestor 20mg  daily -No ASA due to need for apixaban for [facility]  #HLD: LDL very well controlled at 41 in 10/2019. -Continue crestor 20mg  daily   Medication Adjustments/Labs and Tests Ordered: Current medicines are reviewed at length with the patient today.  Concerns regarding medicines  are outlined above.  No orders of the defined types were placed in this encounter.  No orders of the defined types were placed in this encounter.   There are no Patient Instructions on file for this visit.    Signed, Freada Bergeron, MD  08/15/2021 6:31 AM    [place]

## 2021-08-17 ENCOUNTER — Other Ambulatory Visit: Payer: Self-pay

## 2021-08-17 ENCOUNTER — Encounter: Payer: Self-pay | Admitting: Cardiology

## 2021-08-17 ENCOUNTER — Encounter (HOSPITAL_COMMUNITY): Payer: Self-pay | Admitting: Cardiology

## 2021-08-17 ENCOUNTER — Ambulatory Visit (INDEPENDENT_AMBULATORY_CARE_PROVIDER_SITE_OTHER): Payer: BC Managed Care – PPO | Admitting: Cardiology

## 2021-08-17 VITALS — BP 128/78 | HR 113 | Ht 74.0 in | Wt 243.6 lb

## 2021-08-17 DIAGNOSIS — E785 Hyperlipidemia, unspecified: Secondary | ICD-10-CM

## 2021-08-17 DIAGNOSIS — I4819 Other persistent atrial fibrillation: Secondary | ICD-10-CM

## 2021-08-17 DIAGNOSIS — D6869 Other thrombophilia: Secondary | ICD-10-CM

## 2021-08-17 DIAGNOSIS — Z7901 Long term (current) use of anticoagulants: Secondary | ICD-10-CM

## 2021-08-17 DIAGNOSIS — Z01818 Encounter for other preprocedural examination: Secondary | ICD-10-CM

## 2021-08-17 DIAGNOSIS — I77819 Aortic ectasia, unspecified site: Secondary | ICD-10-CM

## 2021-08-17 DIAGNOSIS — I2510 Atherosclerotic heart disease of native coronary artery without angina pectoris: Secondary | ICD-10-CM

## 2021-08-17 DIAGNOSIS — Z01812 Encounter for preprocedural laboratory examination: Secondary | ICD-10-CM

## 2021-08-17 MED ORDER — AMIODARONE HCL 200 MG PO TABS: ORAL_TABLET | ORAL | 0 refills | Status: DC

## 2021-08-17 NOTE — Patient Instructions (Signed)
Medication Instructions:   START TAKING AMIODARONE 400 MG BY MOUTH TWICE DAILY FOR 10 DAYS, THEN DECREASE TO TAKING 200 MG BY MOUTH DAILY THEREAFTER FOR ONE MONTH THEN STOP.  *If you need a refill on your cardiac medications before your next appointment, please call your pharmacy*   Lab Work:  NEXT Thursday 1/19 HERE IN THE OFFICE--CHECK BMET AND CBC-PRE-PROCEDURE LABS   If you have labs (blood work) drawn today and your tests are completely normal, you will receive your results only by: [place] (if you have MyChart) OR A paper copy in the mail If you have any lab test that is abnormal or we need to change your treatment, we will call you to review the results.   Testing/Procedures:   You are scheduled for a  Cardioversion on 08/28/21 with Dr. Marlou Porch.  Please arrive at the [facility] (Main Entrance A) at [facility]: [address] [place], [place] [address] at 7:30  am (1 hour prior to procedure unless lab work is needed; if lab work is needed arrive 1.5 hours ahead)  DIET: Nothing to eat or drink after midnight except a sip of water with medications (see medication instructions below)  FYI: For your safety, and to allow [place] to monitor your vital signs accurately during the surgery/procedure we request that   if you have artificial nails, gel coating, SNS etc. Please have those removed prior to your surgery/procedure. Not having the nail coverings /polish removed may result in cancellation or delay of your surgery/procedure.   Medication Instructions:  Continue your anticoagulant: ELIQUIS--PLEASE CONTINUE WITH NO SKIPPED DOSES You will need to continue your anticoagulant after your procedure until you  are told by your Provider that it is safe to stop   Labs: If patient is on Coumadin, patient needs pt/INR, CBC, BMET within 3 days (No pt/INR needed for patients taking Xarelto, Eliquis, Pradaxa) For patients receiving anesthesia for TEE and all Cardioversion patients:  BMET, CBC within 1 week  Come to: NEXT Thursday 08/24/21 HERE IN THE OFFICE (Lab option #1) Come to the lab at [facility] between the hours of 8:00 am and 4:30 pm. You do not have to be fasting.   You must have a responsible person to drive you home and stay in the waiting area during your procedure. Failure to do so could result in cancellation.  Bring your insurance cards.  *Special Note: Every effort is made to have your procedure done on time. Occasionally there are emergencies that occur at the hospital that may cause delays. Please be patient if a delay does occur.     Follow-Up:  3 MONTHS WITH DR. Johney Frame

## 2021-08-17 NOTE — Progress Notes (Signed)
Attempted to obtain medical history via telephone, unable to reach at this time. I left a voicemail to return pre surgical testing department's phone call.  

## 2021-08-17 NOTE — Progress Notes (Signed)
Cardiology Office Note:    Date:  08/17/2021   ID:  Jorge Kelly, DOB 1956/04/21, MRN [id number]  PCP:  [name], MD   [facility] Providers Cardiologist:  Ena Dawley, MD     Referring MD: [name], MD    History of Present Illness:    Jorge Kelly is a 66 y.o. male with a hx of pAfib and GERD who was previously followed by Dr. [name] who now presents to clinic for follow-up of his pAfib.  Patient has a long-standing history of atrial fibrillation for which he was started on flecainide. During last visit on 12/28/20 he was in Afib after running out of his medications for several days. We recommended repeat ECG to assess if out of rhythm after resuming flec, however, he is just now following up.  Was seen by me on 02/27/21 where he was symptomatic in Afib. We placed monitor which showed 100% burden of Afib. We set him up for DCCV on 04/13/21 where he received shock x2 at 200J without success. He subsequently saw Dr. [name] and underwent Afib ablation on 06/15/21. He was seen back in Afib clinic on 07/13/21 with recurrent Afib. He then underwent DCCV in 08/01/21 with return to NSR.  Today, he is doing overall okay. He developed COVID after his DCCV and flipped back into atrial fibrillation. He is symptomatic with his heart racing, occasional lightheadedness, fatigue, and SOB.  He is doing much better from a COVID standpoint but has stayed in Afib. Due to significant symptoms, he is hoping to be able to undergo cardioversion again.  The patient denies chest pressure, PND, orthopnea, or leg swelling. Denies nausea, vomiting. Denies syncope or presyncope. Denies dizziness. Denies snoring.  Past Medical History:  Diagnosis Date   Actinic keratosis 10/24/2009   ALLERGIC RHINITIS 05/11/2007   ELEVATED BLOOD PRESSURE WITHOUT DIAGNOSIS OF HYPERTENSION 11/12/2007   Dr. Debara Pickett mentioned HTN on problem list   Esophageal stricture    treated with protonix   GERD 05/11/2007    GI bleeding    20 years ago   Kidney stone    Melanoma ([place]) 01/2014   remoted from forehead   MIGRAINE HEADACHE 05/11/2007   Overweight    Persistent atrial fibrillation ([place]) 05/2014   Snores     Past Surgical History:  Procedure Laterality Date   ANKLE FRACTURE SURGERY  2010   x2-left   ATRIAL FIBRILLATION ABLATION N/A 06/15/2021   Procedure: ATRIAL FIBRILLATION ABLATION;  Surgeon: [name], MD;  Location: [facility];  Service: Cardiovascular;  Laterality: N/A;   CARDIOVERSION N/A 06/23/2014   CARDIOVERSION N/A 09/09/2014   CARDIOVERSION N/A 04/13/2021   Procedure: CARDIOVERSION;  Surgeon: [name], MD;  Location: [facility];  Service: Cardiovascular;  Laterality: N/A;   CARDIOVERSION N/A 08/01/2021   Procedure: CARDIOVERSION;  Surgeon: [name], MD;  Location: [facility];  Service: Cardiovascular;  Laterality: N/A;   malignant melanoma     removed from forehead in June 2015   STRABISMUS SURGERY Right 05/21/2014   Dr. Annamaria Boots   UPPER GI ENDOSCOPY     stricture, just PPI    Current Medications: Current Meds  Medication Sig   acetaminophen (TYLENOL) 500 MG tablet Take 1,000 mg by mouth every 8 (eight) hours as needed for moderate pain or headache.   amiodarone (PACERONE) 200 MG tablet Take 2 tablets (400 mg total) by mouth twice daily for 10 days, then decrease to taking 1 tablet (200 mg  total) by mouth daily thereafter for one month.   apixaban (ELIQUIS) 5 MG TABS tablet Take 1 tablet (5 mg total) by mouth 2 (two) times daily.   diltiazem (CARDIZEM CD) 120 MG 24 hr capsule Take 1 capsule (120 mg total) by mouth daily. May take an extra tablet daily for breakthrough afib (Patient taking differently: Take 120 mg by mouth 2 (two) times daily. May take an extra tablet daily for breakthrough afib)   fexofenadine (ALLEGRA) 180 MG tablet Take 180 mg by mouth daily as needed for allergies or rhinitis.   rizatriptan (MAXALT-MLT) 10 MG disintegrating  tablet TAKE DAILY AS NEEDED FOR MIGRAINE MAY REPEAT IN 2 HOURS IF NEEDED X1   rosuvastatin (CRESTOR) 20 MG tablet Take 1 tablet (20 mg total) by mouth daily.     Allergies:   Patient has no known allergies.   Social History   Socioeconomic History   Marital status: Married    Spouse name: Not on file   Number of children: Not on file   Years of education: Not on file   Highest education level: Not on file  Occupational History   Occupation: Attorney  Tobacco Use   Smoking status: Never   Smokeless tobacco: Never  Vaping Use   Vaping Use: Never used  Substance and Sexual Activity   Alcohol use: No    Alcohol/week: 0.0 standard drinks   Drug use: No   Sexual activity: Not on file  Other Topics Concern   Not on file  Social History Narrative   Lives in [place] with wife (wife patient at [facility]).  2 sons.- 63 [place] at [employer] and [place] at [employer] in [place]  Wife with pregnancy at 75 and 39.       Trial lawyer. Criminal defense      Hobbies: sails usually in [place]    Social Determinants of Health   Financial Resource Strain: Not on file  Food Insecurity: Not on file  Transportation Needs: Not on file  Physical Activity: Not on file  Stress: Not on file  Social Connections: Not on file     Family History: The patient's family history includes Atrial fibrillation in his mother; Cancer in his sister; Diabetes type II in his sister; Heart disease in his maternal uncle; Leukemia in his father; Lymphoma in his mother.  ROS:   Please see the history of present illness.    Review of Systems  Constitutional:  Positive for malaise/fatigue. Negative for chills and fever.  HENT:  Positive for congestion. Negative for sore throat.   Eyes:  Negative for blurred vision.  Respiratory:  Negative for shortness of breath.   Cardiovascular:  Positive for chest pain and palpitations. Negative for orthopnea, claudication, leg swelling and PND.  Gastrointestinal:   Negative for blood in stool, melena, nausea and vomiting.  Genitourinary:  Negative for hematuria.  Musculoskeletal:  Negative for falls.  Skin:  Negative for itching and rash.  Neurological:  Positive for dizziness. Negative for loss of consciousness.  Endo/Heme/Allergies:  Does not bruise/bleed easily.  Psychiatric/Behavioral:  Negative for substance abuse. The patient has insomnia.    EKGs/Labs/Other Studies Reviewed:    The following studies were reviewed today: Echo 03/28/21 demonstrated   1. Left ventricular ejection fraction, by estimation, is 65 to 70%. The left ventricle has normal function. The left ventricle has no regional wall motion abnormalities. Left ventricular diastolic function could not be evaluated.   2. Right ventricular systolic function is normal. The  right ventricular  size is normal.   3. Left atrial size was severely dilated.   4. Right atrial size was moderately dilated.   5. The mitral valve is grossly normal. Trivial mitral valve  regurgitation. No evidence of mitral stenosis.   6. The aortic valve is tricuspid. Aortic valve regurgitation is mild.   7. Aortic dilatation noted. There is mild dilatation of the ascending  aorta, measuring 40 mm.   8. The inferior vena cava is normal in size with greater than 50%  respiratory variability, suggesting right atrial pressure of 3 mmHg.   Comparison(s): Prior images unable to be directly viewed, comparison made  by report only.   Conclusion(s)/Recommendation(s): Otherwise normal echocardiogram, with  minor abnormalities described in the report. Mild enlargement of ascending  aorta, previously noted as normal. Consider repeat imaging in 6-12 months.   Coronary Calcium Score 09/2019: FINDINGS: Non-cardiac: See separate report from [facility].   Ascending Aorta: Normal size, minimal calcifications.   Pericardium: Normal.   Coronary arteries: Normal origin.   IMPRESSION: Coronary calcium score of  947. This was 95 percentile for age and sex matched control.  TTE 2015: Study Conclusions   - Left ventricle: The cavity size was normal. There was mild    concentric hypertrophy. Systolic function was normal. The    estimated ejection fraction was in the range of 50% to 55%. Wall    motion was normal; there were no regional wall motion    abnormalities. The study was not technically sufficient to allow    evaluation of LV diastolic dysfunction due to atrial    fibrillation.  - Aortic valve: Structurally normal valve. There was mild    regurgitation.  - Ascending aorta: The ascending aorta was normal in size.  - Mitral valve: Structurally normal valve. There was mild    regurgitation.  - Left atrium: The atrium was moderately dilated.  - Right ventricle: The cavity size was mildly dilated. Wall    thickness was normal. Systolic function was normal.  - Right atrium: The atrium was mildly dilated.  - Tricuspid valve: There was mild regurgitation.  - Pulmonic valve: There was no regurgitation.  - Pulmonary arteries: Systolic pressure was within the normal    range.  - Inferior vena cava: The vessel was dilated. The respirophasic    diameter changes were blunted (< 50%), consistent with elevated    central venous pressure.  - Pericardium, extracardiac: The pericardium was normal in    appearance.  Myoview 2015: Normal stress with no infarct or ischemia   EKG:   08/17/21: Afib, rate 111 bpm 02/25/21: atrial fibrillation with HR 94  Recent Labs: 07/13/2021: BUN 15; Creatinine, Ser 0.96; Hemoglobin 16.0; Platelets 158; Potassium 3.8; Sodium 141  Recent Lipid Panel    Component Value Date/Time   CHOL 103 10/16/2019 0830   TRIG 65 10/16/2019 0830   HDL 46 10/16/2019 0830   CHOLHDL 2.2 10/16/2019 0830   CHOLHDL 4 08/20/2019 1540   VLDL 16.2 08/20/2019 1540   LDLCALC 43 10/16/2019 0830   LDLDIRECT 96.1 09/02/2013 0839     Risk Assessment/Calculations:    CHA2DS2-VASc Score  = 1  {This indicates a 0.6% annual risk of stroke. The patient's score is based upon:       Physical Exam:    VS:  BP 128/78    Pulse (!) 113    Ht 6\' 2"  (1.88 m)    Wt 243 lb 9.6 oz (110.5 kg)  SpO2 96%    BMI 31.28 kg/m     Wt Readings from Last 3 Encounters:  08/17/21 243 lb 9.6 oz (110.5 kg)  08/01/21 245 lb (111.1 kg)  07/13/21 245 lb 12.8 oz (111.5 kg)     GEN:  Comfortable, NAD HEENT: Normal NECK: No JVD; No carotid bruits CARDIAC: Irregularly irregular, Tachycardic, no murmurs, rubs, gallops RESPIRATORY:  Clear to auscultation without rales, wheezing or rhonchi  ABDOMEN: Soft, non-tender, non-distended MUSCULOSKELETAL:  No edema; No deformity  SKIN: Warm and dry NEUROLOGIC:  Alert and oriented x 3 PSYCHIATRIC:  Normal affect   ASSESSMENT:    1. Persistent atrial fibrillation ([place])   2. Aortic dilatation ([facility])   3. Hyperlipidemia, unspecified hyperlipidemia type   4. Chronic anticoagulation   5. Acquired thrombophilia ([place])   6. Coronary artery disease involving native coronary artery of native heart without angina pectoris   7. Pre-procedure lab exam     PLAN:    In order of problems listed above:  #Persistent afib: #Chronic Anticoagulation: CHADs-vasc 1 for nonobstructive CAD.Had recurrent episodes of Afib in 02/2021 with unsuccessful DCCV in 03/2021. Now s/p Afib ablation on 06/15/21 with Dr. [name]. Following ablation, patient had recurrence of Afib and underwent repeat DCCV on 08/01/21. Developed COVID post-DCCV and flipped back into Afib again which he remains in today. Very symptomatic with palpitations, fatigue, SOB, and lightheadedness. Will load with amiodarone and plan for repeat DCCV. -Start amiodarone load with amiodarone 400mg  BID x10 days and then 200mg  daily thereafter for total of 1 month -Plan for DCCV next week if remains in Afib; has not missed any doses of apixaban -Continue dilt 120mg  daily with 60mg  as needed for breakthrough -Continue  apixaban 5mg  BID  #Non-obstructive CAD with coronary calcium score 947: Asymptomatic with no anginal symptoms. -Continue crestor 20mg  daily -No ASA due to need for apixaban for [facility]  #HLD: LDL at goal of 43.  -Continue crestor 20mg  daily -Repeat lipids at next visit  #Ascending Aortic Aneurysm: 61mm on TTE 03/2021. Will need serial monitoring -Will need yearly CTA/MRA with next 03/2022   Medication Adjustments/Labs and Tests Ordered: Current medicines are reviewed at length with the patient today.  Concerns regarding medicines are outlined above.  Orders Placed This Encounter  Procedures   Basic metabolic panel   CBC   Meds ordered this encounter  Medications   amiodarone (PACERONE) 200 MG tablet    Sig: Take 2 tablets (400 mg total) by mouth twice daily for 10 days, then decrease to taking 1 tablet (200 mg total) by mouth daily thereafter for one month.    Dispense:  60 tablet    Refill:  0     Patient Instructions  Medication Instructions:   START TAKING AMIODARONE 400 MG BY MOUTH TWICE DAILY FOR 10 DAYS, THEN DECREASE TO TAKING 200 MG BY MOUTH DAILY THEREAFTER FOR ONE MONTH THEN STOP.  *If you need a refill on your cardiac medications before your next appointment, please call your pharmacy*   Lab Work:  NEXT Thursday 1/19 HERE IN THE OFFICE--CHECK BMET AND CBC-PRE-PROCEDURE LABS   If you have labs (blood work) drawn today and your tests are completely normal, you will receive your results only by: [facility] (if you have MyChart) OR A paper copy in the mail If you have any lab test that is abnormal or we need to change your treatment, we will call you to review the results.   Testing/Procedures:   You are scheduled for a  Cardioversion on 08/28/21 with Dr. Marlou Porch.  Please arrive at the [facility] (Main Entrance A) at [facility]: [address] [place], [place] [address] at 7:30  am (1 hour prior to procedure unless lab work is needed; if lab work  is needed arrive 1.5 hours ahead)  DIET: Nothing to eat or drink after midnight except a sip of water with medications (see medication instructions below)  FYI: For your safety, and to allow [place] to monitor your vital signs accurately during the surgery/procedure we request that   if you have artificial nails, gel coating, SNS etc. Please have those removed prior to your surgery/procedure. Not having the nail coverings /polish removed may result in cancellation or delay of your surgery/procedure.   Medication Instructions:  Continue your anticoagulant: ELIQUIS--PLEASE CONTINUE WITH NO SKIPPED DOSES You will need to continue your anticoagulant after your procedure until you  are told by your Provider that it is safe to stop   Labs: If patient is on Coumadin, patient needs pt/INR, CBC, BMET within 3 days (No pt/INR needed for patients taking Xarelto, Eliquis, Pradaxa) For patients receiving anesthesia for TEE and all Cardioversion patients: BMET, CBC within 1 week  Come to: NEXT Thursday 08/24/21 HERE IN THE OFFICE (Lab option #1) Come to the lab at [facility] between the hours of 8:00 am and 4:30 pm. You do not have to be fasting.   You must have a responsible person to drive you home and stay in the waiting area during your procedure. Failure to do so could result in cancellation.  Bring your insurance cards.  *Special Note: Every effort is made to have your procedure done on time. Occasionally there are emergencies that occur at the hospital that may cause delays. Please be patient if a delay does occur.     Follow-Up:  3 MONTHS WITH DR. Reece Leader as a scribe for Freada Bergeron, MD.,have documented all relevant documentation on the behalf of Freada Bergeron, MD,as directed by  Freada Bergeron, MD while in the presence of Freada Bergeron, MD.  I, Freada Bergeron, MD, have reviewed all documentation for this visit. The  documentation on 08/17/21 for the exam, diagnosis, procedures, and orders are all accurate and complete.   Signed, Freada Bergeron, MD  08/17/2021 10:39 AM    [place]

## 2021-08-18 ENCOUNTER — Encounter: Payer: Self-pay | Admitting: Cardiology

## 2021-08-22 ENCOUNTER — Other Ambulatory Visit (INDEPENDENT_AMBULATORY_CARE_PROVIDER_SITE_OTHER): Payer: BC Managed Care – PPO | Admitting: *Deleted

## 2021-08-22 DIAGNOSIS — I4819 Other persistent atrial fibrillation: Secondary | ICD-10-CM

## 2021-08-22 DIAGNOSIS — I2510 Atherosclerotic heart disease of native coronary artery without angina pectoris: Secondary | ICD-10-CM | POA: Diagnosis not present

## 2021-08-24 ENCOUNTER — Other Ambulatory Visit: Payer: BC Managed Care – PPO

## 2021-08-24 ENCOUNTER — Other Ambulatory Visit: Payer: Self-pay

## 2021-08-24 DIAGNOSIS — I4819 Other persistent atrial fibrillation: Secondary | ICD-10-CM

## 2021-08-24 DIAGNOSIS — Z7901 Long term (current) use of anticoagulants: Secondary | ICD-10-CM

## 2021-08-24 DIAGNOSIS — Z01812 Encounter for preprocedural laboratory examination: Secondary | ICD-10-CM

## 2021-08-24 DIAGNOSIS — D6869 Other thrombophilia: Secondary | ICD-10-CM

## 2021-08-24 DIAGNOSIS — E785 Hyperlipidemia, unspecified: Secondary | ICD-10-CM

## 2021-08-24 DIAGNOSIS — I2510 Atherosclerotic heart disease of native coronary artery without angina pectoris: Secondary | ICD-10-CM

## 2021-08-24 DIAGNOSIS — I77819 Aortic ectasia, unspecified site: Secondary | ICD-10-CM

## 2021-08-24 LAB — BASIC METABOLIC PANEL
BUN/Creatinine Ratio: 20 (ref 10–24)
BUN: 20 mg/dL (ref 8–27)
CO2: 26 mmol/L (ref 20–29)
Calcium: 8.9 mg/dL (ref 8.6–10.2)
Chloride: 108 mmol/L — ABNORMAL HIGH (ref 96–106)
Creatinine, Ser: 1.01 mg/dL (ref 0.76–1.27)
Glucose: 80 mg/dL (ref 70–99)
Potassium: 4.5 mmol/L (ref 3.5–5.2)
Sodium: 139 mmol/L (ref 134–144)
eGFR: 83 mL/min/{1.73_m2} (ref >59)

## 2021-08-24 LAB — CBC
Hematocrit: 42.8 % (ref 37.5–51.0)
Hemoglobin: 15.3 g/dL (ref 13.0–17.7)
MCH: 31.6 pg (ref 26.6–33.0)
MCHC: 35.7 g/dL (ref 31.5–35.7)
MCV: 88 fL (ref 79–97)
Platelets: 171 10*3/uL (ref 150–450)
RBC: 4.84 x10E6/uL (ref 4.14–5.80)
RDW: 14.3 % (ref 11.6–15.4)
WBC: 5.5 10*3/uL (ref 3.4–10.8)

## 2021-08-27 NOTE — Anesthesia Preprocedure Evaluation (Addendum)
Anesthesia Evaluation    Reviewed: Allergy & Precautions, Patient's Chart, lab work & pertinent test results  Airway        Dental   Pulmonary neg pulmonary ROS,           Cardiovascular hypertension, + dysrhythmias (on eliquis) Atrial Fibrillation  Rhythm:Irregular  TTE 2022 1. Left ventricular ejection fraction, by estimation, is 65 to 70%. The  left ventricle has normal function. The left ventricle has no regional  wall motion abnormalities. Left ventricular diastolic function could not  be evaluated.  2. Right ventricular systolic function is normal. The right ventricular  size is normal.  3. Left atrial size was severely dilated.  4. Right atrial size was moderately dilated.  5. The mitral valve is grossly normal. Trivial mitral valve  regurgitation. No evidence of mitral stenosis.  6. The aortic valve is tricuspid. Aortic valve regurgitation is mild.  7. Aortic dilatation noted. There is mild dilatation of the ascending  aorta, measuring 40 mm.  8. The inferior vena cava is normal in size with greater than 50%  respiratory variability, suggesting right atrial pressure of 3 mmHg.   Neuro/Psych  Headaches, negative psych ROS   GI/Hepatic Neg liver ROS, GERD  ,  Endo/Other  negative endocrine ROS  Renal/GU negative Renal ROS  negative genitourinary   Musculoskeletal negative musculoskeletal ROS (+)   Abdominal   Peds  Hematology negative hematology ROS (+)   Anesthesia Other Findings   Reproductive/Obstetrics                             Anesthesia Physical Anesthesia Plan  ASA: 3  Anesthesia Plan: General   Post-op Pain Management:    Induction: Intravenous  PONV Risk Score and Plan: Propofol infusion and Treatment may vary due to age or medical condition  Airway Management Planned: Natural Airway  Additional Equipment:   Intra-op Plan:   Post-operative Plan:    Informed Consent: I have reviewed the patients History and Physical, chart, labs and discussed the procedure including the risks, benefits and alternatives for the proposed anesthesia with the patient or authorized representative who has indicated his/her understanding and acceptance.     Dental advisory given  Plan Discussed with: CRNA  Anesthesia Plan Comments: (Case cancelled by cardiologist. )       Anesthesia Quick Evaluation

## 2021-08-28 ENCOUNTER — Encounter (HOSPITAL_COMMUNITY)
Admission: RE | Disposition: A | Discharge status: Home / Self Care | Payer: Self-pay | Source: Home / Self Care | Attending: Cardiology

## 2021-08-28 ENCOUNTER — Ambulatory Visit (HOSPITAL_COMMUNITY)
Admission: RE | Admit: 2021-08-28 | Discharge: 2021-08-28 | Disposition: A | Discharge status: Home / Self Care | Payer: BC Managed Care – PPO | Attending: Cardiology | Admitting: Cardiology

## 2021-08-28 ENCOUNTER — Encounter (HOSPITAL_COMMUNITY): Payer: Self-pay | Admitting: Cardiology

## 2021-08-28 ENCOUNTER — Ambulatory Visit (HOSPITAL_COMMUNITY): Payer: BC Managed Care – PPO | Admitting: Anesthesiology

## 2021-08-28 ENCOUNTER — Other Ambulatory Visit: Payer: Self-pay

## 2021-08-28 DIAGNOSIS — I10 Essential (primary) hypertension: Secondary | ICD-10-CM | POA: Diagnosis not present

## 2021-08-28 DIAGNOSIS — Z538 Procedure and treatment not carried out for other reasons: Secondary | ICD-10-CM | POA: Diagnosis not present

## 2021-08-28 DIAGNOSIS — Z7901 Long term (current) use of anticoagulants: Secondary | ICD-10-CM | POA: Insufficient documentation

## 2021-08-28 DIAGNOSIS — I4819 Other persistent atrial fibrillation: Secondary | ICD-10-CM | POA: Insufficient documentation

## 2021-08-28 DIAGNOSIS — I4891 Unspecified atrial fibrillation: Secondary | ICD-10-CM | POA: Diagnosis not present

## 2021-08-28 SURGERY — CANCELLED PROCEDURE

## 2021-08-28 NOTE — Progress Notes (Signed)
° °  In normal sinus rhythm, 80's. Cancelled cardioversion.  Candee Furbish, MD

## 2021-08-31 ENCOUNTER — Encounter: Payer: Self-pay | Admitting: Cardiology

## 2021-09-01 MED ORDER — AMIODARONE HCL 200 MG PO TABS: 200.0000 mg | ORAL_TABLET | Freq: Two times a day (BID) | ORAL | 2 refills | Status: DC

## 2021-09-04 ENCOUNTER — Other Ambulatory Visit: Payer: Self-pay | Admitting: Internal Medicine

## 2021-09-04 ENCOUNTER — Ambulatory Visit (HOSPITAL_COMMUNITY)
Admission: RE | Admit: 2021-09-04 | Discharge: 2021-09-04 | Disposition: A | Discharge status: Home / Self Care | Payer: BC Managed Care – PPO | Source: Ambulatory Visit | Attending: Physician Assistant | Admitting: Physician Assistant

## 2021-09-04 ENCOUNTER — Other Ambulatory Visit: Payer: Self-pay

## 2021-09-04 DIAGNOSIS — I4819 Other persistent atrial fibrillation: Secondary | ICD-10-CM | POA: Insufficient documentation

## 2021-09-04 DIAGNOSIS — Z09 Encounter for follow-up examination after completed treatment for conditions other than malignant neoplasm: Secondary | ICD-10-CM | POA: Insufficient documentation

## 2021-09-04 DIAGNOSIS — I2510 Atherosclerotic heart disease of native coronary artery without angina pectoris: Secondary | ICD-10-CM | POA: Diagnosis not present

## 2021-09-04 DIAGNOSIS — Z7901 Long term (current) use of anticoagulants: Secondary | ICD-10-CM | POA: Diagnosis not present

## 2021-09-04 DIAGNOSIS — Z79899 Other long term (current) drug therapy: Secondary | ICD-10-CM | POA: Insufficient documentation

## 2021-09-04 DIAGNOSIS — E669 Obesity, unspecified: Secondary | ICD-10-CM | POA: Insufficient documentation

## 2021-09-04 DIAGNOSIS — I10 Essential (primary) hypertension: Secondary | ICD-10-CM | POA: Insufficient documentation

## 2021-09-04 DIAGNOSIS — Z6831 Body mass index (BMI) 31.0-31.9, adult: Secondary | ICD-10-CM | POA: Diagnosis not present

## 2021-09-04 DIAGNOSIS — Z8616 Personal history of COVID-19: Secondary | ICD-10-CM | POA: Diagnosis not present

## 2021-09-04 MED ORDER — DILTIAZEM HCL ER COATED BEADS 120 MG PO CP24: 120.0000 mg | ORAL_CAPSULE | Freq: Two times a day (BID) | ORAL | 3 refills | Status: DC

## 2021-09-04 MED ORDER — AMIODARONE HCL 200 MG PO TABS: ORAL_TABLET | ORAL | 0 refills | Status: DC

## 2021-09-04 NOTE — Patient Instructions (Signed)
Decrease Amiodarone 200mg  twice a day until follow up with Dr. [name]

## 2021-09-04 NOTE — Progress Notes (Signed)
Primary Care Physician: [name], MD Primary Cardiologist: Dr Johney Frame  Primary Electrophysiologist: Dr [name] Referring Physician: [facility]    Jorge Kelly is a 66 y.o. male with a history of HTN and atrial fibrillation who presents for follow up in the [facility].  The patient was initially diagnosed with atrial fibrillation in 2015. He has been maintained on flecainide. Patient is on Eliquis for a CHADS2VASC score of 1. Patient had done well with little afib until 12/2020 when he was noted to be in afib at his follow up. Patient admits he ran out of his medication. He resumed the medication and a heart monitor was placed to evaluate if he was persistent vs paroxysmal. The monitor showed 100 % afib burden. He is symptomatic with fatigue, palpitations, and dizziness. Patient underwent unsuccessful DCCV on 04/13/21. He was referred to Dr [name] and underwent afib ablation on 06/15/21. He had recurrent afib and underwent DCCV on 08/01/21. He was again in afib on follow up on 08/17/21 and was loaded on amiodarone with plan for repeat DCCV but he chemically converted prior to the procedure.   On follow up today, patient remains in [place] today. He does report that the last time he was in afib he was also diagnosed with COVID. He has gained 30-40 lbs in the past year and feels his persistence of afib is related to that. He is working on diet and exercise.   Today, he denies symptoms of palpitations, chest pain, shortness of breath, orthopnea, PND, lower extremity edema, presyncope, syncope, snoring, daytime somnolence, bleeding, or neurologic sequela. The patient is tolerating medications without difficulties and is otherwise without complaint today.    Atrial Fibrillation Risk Factors:  he does not have symptoms or diagnosis of sleep apnea. Negative sleep study 2015. he does not have a history of rheumatic fever. he does not have a history of alcohol  use. The patient does not have a history of early familial atrial fibrillation or other arrhythmias.  he has a BMI of Body mass index is 31.28 kg/m.Marland Kitchen Filed Weights   09/04/21 0826  Weight: 110.5 kg     Family History  Problem Relation Age of Onset   Lymphoma Mother    Atrial fibrillation Mother        pacemaker in 78s   Leukemia Father        passed from [place] Sister        treated, cancer free   Diabetes type II Sister    Heart disease Maternal Uncle        mother lost 6 brothers to heart disease in 30s     Atrial Fibrillation Management history:  Previous antiarrhythmic drugs: Multaq, flecainide, amiodarone  Previous cardioversions: 2015, 2016, 08/01/21 Previous ablations: 06/15/21 CHADS2VASC score: 1 Anticoagulation history: Eliquis   Past Medical History:  Diagnosis Date   Actinic keratosis 10/24/2009   ALLERGIC RHINITIS 05/11/2007   ELEVATED BLOOD PRESSURE WITHOUT DIAGNOSIS OF HYPERTENSION 11/12/2007   Dr. Debara Pickett mentioned HTN on problem list   Esophageal stricture    treated with protonix   GERD 05/11/2007   GI bleeding    20 years ago   Kidney stone    Melanoma ([place]) 01/2014   remoted from forehead   MIGRAINE HEADACHE 05/11/2007   Overweight    Persistent atrial fibrillation ([place]) 05/2014   Snores    Past Surgical History:  Procedure Laterality Date   ANKLE FRACTURE SURGERY  2010  x2-left   ATRIAL FIBRILLATION ABLATION N/A 06/15/2021   Procedure: ATRIAL FIBRILLATION ABLATION;  Surgeon: [name], MD;  Location: [facility] [facility];  Service: Cardiovascular;  Laterality: N/A;   CARDIOVERSION N/A 06/23/2014   CARDIOVERSION N/A 09/09/2014   CARDIOVERSION N/A 04/13/2021   Procedure: CARDIOVERSION;  Surgeon: [name], MD;  Location: [facility];  Service: Cardiovascular;  Laterality: N/A;   CARDIOVERSION N/A 08/01/2021   Procedure: CARDIOVERSION;  Surgeon: [name], MD;  Location: [place];  Service:  Cardiovascular;  Laterality: N/A;   malignant melanoma     removed from forehead in June 2015   STRABISMUS SURGERY Right 05/21/2014   Dr. Annamaria Boots   UPPER GI ENDOSCOPY     stricture, just PPI    Current Outpatient Medications  Medication Sig Dispense Refill   acetaminophen (TYLENOL) 500 MG tablet Take 1,000 mg by mouth as needed for moderate pain or headache.     amiodarone (PACERONE) 200 MG tablet Take 1 tablet (200 mg total) by mouth 2 (two) times daily. (Patient taking differently: Take 200 mg by mouth 2 (two) times daily. Taking 400mg  by mouth twice daily) 60 tablet 2   apixaban (ELIQUIS) 5 MG TABS tablet Take 1 tablet (5 mg total) by mouth 2 (two) times daily. 180 tablet 3   diltiazem (CARDIZEM CD) 120 MG 24 hr capsule Take 1 capsule (120 mg total) by mouth daily. May take an extra tablet daily for breakthrough afib (Patient taking differently: Take 120 mg by mouth 2 (two) times daily. May take an extra tablet daily for breakthrough afib) 120 capsule 1   fexofenadine (ALLEGRA) 180 MG tablet Take 180 mg by mouth daily as needed for allergies or rhinitis.     rizatriptan (MAXALT-MLT) 10 MG disintegrating tablet TAKE DAILY AS NEEDED FOR MIGRAINE MAY REPEAT IN 2 HOURS IF NEEDED X1 9 tablet 2   rosuvastatin (CRESTOR) 20 MG tablet Take 1 tablet (20 mg total) by mouth daily. 90 tablet 3   No current facility-administered medications for this encounter.    No Known Allergies  Social History   Socioeconomic History   Marital status: Married    Spouse name: Not on file   Number of children: Not on file   Years of education: Not on file   Highest education level: Not on file  Occupational History   Occupation: Attorney  Tobacco Use   Smoking status: Never   Smokeless tobacco: Never  Vaping Use   Vaping Use: Never used  Substance and Sexual Activity   Alcohol use: No    Alcohol/week: 0.0 standard drinks   Drug use: No   Sexual activity: Not on file  Other Topics Concern   Not on  file  Social History Narrative   Lives in [place] with wife (wife patient at [facility]).  2 sons.- 26 [place] at [employer] and [place] at [employer] in [place]  Wife with pregnancy at 33 and 39.       Trial lawyer. Criminal defense      Hobbies: sails usually in [place]    Social Determinants of Health   Financial Resource Strain: Not on file  Food Insecurity: Not on file  Transportation Needs: Not on file  Physical Activity: Not on file  Stress: Not on file  Social Connections: Not on file  Intimate Partner Violence: Not on file     ROS- All systems are reviewed and negative except as per the HPI above.  Physical Exam: Vitals:  09/04/21 0826  BP: 130/84  Pulse: 88  Weight: 110.5 kg  Height: 6\' 2"  (1.88 m)    GEN- The patient is a well appearing obese male, alert and oriented x 3 today.   HEENT-head normocephalic, atraumatic, sclera clear, conjunctiva pink, hearing intact, trachea midline. Lungs- Clear to ausculation bilaterally, normal work of breathing Heart- Regular rate and rhythm, no murmurs, rubs or gallops  GI- soft, NT, ND, + BS Extremities- no clubbing, cyanosis, or edema MS- no significant deformity or atrophy Skin- no rash or lesion Psych- euthymic mood, full affect Neuro- strength and sensation are intact   Wt Readings from Last 3 Encounters:  09/04/21 110.5 kg  08/17/21 110.5 kg  08/01/21 111.1 kg    EKG today demonstrates  SR Vent. rate 88 BPM PR interval 186 ms QRS duration 102 ms QT/QTcB 394/476 ms  Echo 03/28/21 demonstrated   1. Left ventricular ejection fraction, by estimation, is 65 to 70%. The left ventricle has normal function. The left ventricle has no regional wall motion abnormalities. Left ventricular diastolic function could not be evaluated.   2. Right ventricular systolic function is normal. The right ventricular  size is normal.   3. Left atrial size was severely dilated.   4. Right atrial size was moderately dilated.    5. The mitral valve is grossly normal. Trivial mitral valve  regurgitation. No evidence of mitral stenosis.   6. The aortic valve is tricuspid. Aortic valve regurgitation is mild.   7. Aortic dilatation noted. There is mild dilatation of the ascending  aorta, measuring 40 mm.   8. The inferior vena cava is normal in size with greater than 50%  respiratory variability, suggesting right atrial pressure of 3 mmHg.   Comparison(s): Prior images unable to be directly viewed, comparison made by report only.   Conclusion(s)/Recommendation(s): Otherwise normal echocardiogram, with minor abnormalities described in the report. Mild enlargement of ascending aorta, previously noted as normal. Consider repeat imaging in 6-12 months.   Epic records are reviewed at length today  CHA2DS2-VASc Score = 1  The patient's score is based upon: CHF History: 0 HTN History: 1 Diabetes History: 0 Stroke History: 0 Vascular Disease History: 0 Age Score: 0 Gender Score: 0    ASSESSMENT AND PLAN: 1. Persistent Atrial Fibrillation (ICD10:  I48.19) The patient's CHA2DS2-VASc score is 1, indicating a 0.6% annual risk of stroke.   S/p afib ablation 06/15/21 Will decrease amiodarone to 200 mg BID, hopefully this will be short term post ablation.  Continue diltiazem 120 mg BID with 60 mg PRN for heart racing Continue Eliquis 5 mg BID with no missed doses for 3 months post ablation. With his recent weight gain, ? If we may need to repeat sleep study. He will d/w his wife first.   2. Obesity Body mass index is 31.28 kg/m. Lifestyle modification was discussed and encouraged including regular physical activity and weight reduction.  3. CAD Calcium score 947 On statin No anginal symptoms.   Follow up with Dr [name] as scheduled.    [facility] [address] [place], [place] [address] [phone] 09/04/2021 8:45 AM

## 2021-09-22 ENCOUNTER — Encounter: Payer: Self-pay | Admitting: Internal Medicine

## 2021-09-22 ENCOUNTER — Other Ambulatory Visit: Payer: Self-pay

## 2021-09-22 ENCOUNTER — Ambulatory Visit (INDEPENDENT_AMBULATORY_CARE_PROVIDER_SITE_OTHER): Payer: BC Managed Care – PPO | Admitting: Internal Medicine

## 2021-09-22 VITALS — BP 136/82 | HR 86 | Ht 73.5 in | Wt 242.2 lb

## 2021-09-22 DIAGNOSIS — D6869 Other thrombophilia: Secondary | ICD-10-CM

## 2021-09-22 DIAGNOSIS — Z79899 Other long term (current) drug therapy: Secondary | ICD-10-CM | POA: Diagnosis not present

## 2021-09-22 DIAGNOSIS — I4819 Other persistent atrial fibrillation: Secondary | ICD-10-CM

## 2021-09-22 LAB — TSH: TSH: 7.65 u[IU]/mL — ABNORMAL HIGH (ref 0.450–4.500)

## 2021-09-22 LAB — COMPREHENSIVE METABOLIC PANEL
ALT: 18 IU/L (ref 0–44)
AST: 13 IU/L (ref 0–40)
Albumin/Globulin Ratio: 2.3 — ABNORMAL HIGH (ref 1.2–2.2)
Albumin: 4.4 g/dL (ref 3.8–4.8)
Alkaline Phosphatase: 82 IU/L (ref 44–121)
BUN/Creatinine Ratio: 21 (ref 10–24)
BUN: 21 mg/dL (ref 8–27)
Bilirubin Total: 0.6 mg/dL (ref 0.0–1.2)
CO2: 22 mmol/L (ref 20–29)
Calcium: 9.7 mg/dL (ref 8.6–10.2)
Chloride: 105 mmol/L (ref 96–106)
Creatinine, Ser: 1 mg/dL (ref 0.76–1.27)
Globulin, Total: 1.9 g/dL (ref 1.5–4.5)
Glucose: 82 mg/dL (ref 70–99)
Potassium: 4.3 mmol/L (ref 3.5–5.2)
Sodium: 139 mmol/L (ref 134–144)
Total Protein: 6.3 g/dL (ref 6.0–8.5)
eGFR: 84 mL/min/{1.73_m2} (ref >59)

## 2021-09-22 LAB — T4, FREE: Free T4: 1.16 ng/dL (ref 0.82–1.77)

## 2021-09-22 MED ORDER — AMIODARONE HCL 200 MG PO TABS: ORAL_TABLET | ORAL | 1 refills | Status: DC

## 2021-09-22 NOTE — Patient Instructions (Addendum)
Medication Instructions:  Your physician has recommended you make the following change in your medication:    ON October 04, 2021-  Reduce your amiodarone 200 mg to one tablet by mouth daily  Labwork: You will get lab work today:  CMP, TSH, T4  Testing/Procedures: None ordered.  Follow-Up: Your physician wants you to follow-up in: 3 months with Dr. [name]   Any Other Special Instructions Will Be Listed Below (If Applicable).  If you need a refill on your cardiac medications before your next appointment, please call your pharmacy.

## 2021-09-22 NOTE — Progress Notes (Signed)
PCP: [name], MD Primary Cardiologist: Dr Roddie Mc is a 66 y.o. male who presents today for routine electrophysiology followup.  Since his recent afib ablation, the patient reports doing very well.  he denies procedure related complications and is pleased with the results of the procedure.  He has had some ERAF. He is in sinus today. Today, he denies symptoms of palpitations, chest pain, shortness of breath,  lower extremity edema, dizziness, presyncope, or syncope.  The patient is otherwise without complaint today.   Past Medical History:  Diagnosis Date   Actinic keratosis 10/24/2009   ALLERGIC RHINITIS 05/11/2007   ELEVATED BLOOD PRESSURE WITHOUT DIAGNOSIS OF HYPERTENSION 11/12/2007   Dr. Debara Pickett mentioned HTN on problem list   Esophageal stricture    treated with protonix   GERD 05/11/2007   GI bleeding    20 years ago   Kidney stone    Melanoma ([place]) 01/2014   remoted from forehead   MIGRAINE HEADACHE 05/11/2007   Overweight    Persistent atrial fibrillation ([place]) 05/2014   Snores    Past Surgical History:  Procedure Laterality Date   ANKLE FRACTURE SURGERY  2010   x2-left   ATRIAL FIBRILLATION ABLATION N/A 06/15/2021   Procedure: ATRIAL FIBRILLATION ABLATION;  Surgeon: [name], MD;  Location: [facility];  Service: Cardiovascular;  Laterality: N/A;   CARDIOVERSION N/A 06/23/2014   CARDIOVERSION N/A 09/09/2014   CARDIOVERSION N/A 04/13/2021   Procedure: CARDIOVERSION;  Surgeon: [name], MD;  Location: [facility];  Service: Cardiovascular;  Laterality: N/A;   CARDIOVERSION N/A 08/01/2021   Procedure: CARDIOVERSION;  Surgeon: [name], MD;  Location: [facility];  Service: Cardiovascular;  Laterality: N/A;   malignant melanoma     removed from forehead in June 2015   STRABISMUS SURGERY Right 05/21/2014   Dr. Annamaria Boots   UPPER GI ENDOSCOPY     stricture, just PPI    ROS- all systems are personally reviewed and  negatives except as per HPI above  Current Outpatient Medications  Medication Sig Dispense Refill   acetaminophen (TYLENOL) 500 MG tablet Take 1,000 mg by mouth every 6 (six) hours as needed for moderate pain or headache.     amiodarone (PACERONE) 200 MG tablet Take 1 tablet by mouth twice a day for one month then reduce to 1 tablet daily 60 tablet 0   apixaban (ELIQUIS) 5 MG TABS tablet Take 1 tablet (5 mg total) by mouth 2 (two) times daily. 180 tablet 3   diltiazem (CARDIZEM CD) 120 MG 24 hr capsule Take 1 capsule (120 mg total) by mouth 2 (two) times daily. May take an extra tablet daily for breakthrough afib 60 capsule 3   fexofenadine (ALLEGRA) 180 MG tablet Take 180 mg by mouth daily as needed for allergies or rhinitis.     rizatriptan (MAXALT-MLT) 10 MG disintegrating tablet TAKE DAILY AS NEEDED FOR MIGRAINE MAY REPEAT IN 2 HOURS IF NEEDED X1 9 tablet 2   rosuvastatin (CRESTOR) 20 MG tablet Take 1 tablet (20 mg total) by mouth daily. 90 tablet 3   No current facility-administered medications for this visit.    Physical Exam: Vitals:   09/22/21 0856  BP: 136/82  Pulse: 86  SpO2: 93%  Weight: 242 lb 3.2 oz (109.9 kg)  Height: 6' 1.5" (1.867 m)    GEN- The patient is well appearing, alert and oriented x 3 today.   Head- normocephalic, atraumatic Eyes-  Sclera clear, conjunctiva pink  Ears- hearing intact Oropharynx- clear Lungs- normal work of breathing Heart- Regular rate and rhythm  GI- soft  Extremities- no clubbing, cyanosis, or edema  EKG tracing ordered today is personally reviewed and shows sinus  Assessment and Plan:  1. Persistent atrial fibrillation Doing well s/p ablation chads2vasc score is 1.  He is on eliquis He has been started on amiodarone He will reduce to 200mg  daily 10/04/21 Check lfts and tfts today Consider stopping eliquis on return We discussed risks of amiodarone at length today.  He wishes to continue this medicine    Return to see me in 3  months  [name] MD, [facility] 09/22/2021 9:08 AM

## 2021-09-29 ENCOUNTER — Telehealth: Payer: Self-pay | Admitting: Family Medicine

## 2021-09-29 NOTE — Telephone Encounter (Signed)
This pt needs to be scheduled for a FU to discuss lab results. Dr Yong Channel has nothing available, not even a SD until 10/13/21. Does he need to be seen sooner than 10/13/21?

## 2021-10-02 ENCOUNTER — Encounter: Payer: Self-pay | Admitting: Family Medicine

## 2021-10-02 NOTE — Telephone Encounter (Signed)
TSH was only mildly off- I actually think waiting until 10/13/21 makes sense- bc I want to give some time before rechecking the lab. Unless there was another lab there is concern about but that was the main one I was sent

## 2021-10-02 NOTE — Telephone Encounter (Signed)
See below

## 2021-10-02 NOTE — Telephone Encounter (Signed)
Called pt and left VM to call our office to schedule with [facility].

## 2021-10-02 NOTE — Telephone Encounter (Signed)
See below regarding scheduling. ?

## 2021-10-06 NOTE — Progress Notes (Signed)
? ?Phone [phone] ?In person visit ?  ?Subjective:  ? ?Jorge Kelly is a 66 y.o. year old very pleasant male patient who presents for/with See problem oriented charting ?Chief Complaint  ?Patient presents with  ? Follow-up  ? ? ?This visit occurred during the SARS-CoV-2 public health emergency.  Safety protocols were in place, including screening questions prior to the visit, additional usage of staff PPE, and extensive cleaning of exam room while observing appropriate contact time as indicated for disinfecting solutions.  ? ?Past Medical History-  ?Patient Active Problem List  ? Diagnosis Date Noted  ? PAF (paroxysmal atrial fibrillation) ([place]) 05/21/2014  ?  Priority: High  ? Hyperlipidemia, unspecified 08/20/2019  ?  Priority: Medium   ? History of melanoma 01/04/2014  ?  Priority: Medium   ? Migraine 05/11/2007  ?  Priority: Medium   ? Thrombocytopenia ([place]) 12/02/2019  ?  Priority: Low  ? Inguinal hernia 12/10/2014  ?  Priority: Low  ? Snoring 08/25/2014  ?  Priority: Low  ? Overweight 08/25/2014  ?  Priority: Low  ? 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  Priority: Low  ? Actinic keratosis 10/24/2009  ?  Priority: Low  ? GERD 05/11/2007  ?  Priority: Low  ? Persistent atrial fibrillation ([place]) 04/04/2021  ? ? ?Medications- reviewed and updated ?Current Outpatient Medications  ?Medication Sig Dispense Refill  ? acetaminophen (TYLENOL) 500 MG tablet Take 1,000 mg by mouth every 6 (six) hours as needed for moderate pain or headache.    ? amiodarone (PACERONE) 200 MG tablet Take 1 tablet (200 mg total) by mouth 2 (two) times daily for 12 days, THEN 1 tablet (200 mg total) daily. 90 tablet 1  ? apixaban (ELIQUIS) 5 MG TABS tablet Take 1 tablet (5 mg total) by mouth 2 (two) times daily. 180 tablet 3  ? diltiazem (CARDIZEM CD) 120 MG 24 hr capsule Take 1 capsule (120 mg total) by mouth 2 (two) times daily. May take an extra tablet daily for breakthrough afib 60 capsule 3  ? fexofenadine (ALLEGRA) 180 MG tablet  Take 180 mg by mouth daily as needed for allergies or rhinitis.    ? rizatriptan (MAXALT-MLT) 10 MG disintegrating tablet TAKE DAILY AS NEEDED FOR MIGRAINE MAY REPEAT IN 2 HOURS IF NEEDED X1 9 tablet 2  ? rosuvastatin (CRESTOR) 20 MG tablet Take 1 tablet (20 mg total) by mouth daily. 90 tablet 3  ? ?No current facility-administered medications for this visit.  ? ?  ?Objective:  ?BP 110/68   Pulse 81   Temp 97.9 ?F (36.6 ?C)   Ht 6' 1.5" (1.867 m)   Wt 241 lb 6.4 oz (109.5 kg)   SpO2 96%   BMI 31.42 kg/m?  ?Gen: NAD, resting comfortably ?CV: RRR no murmurs rubs or gallops ?Lungs: CTAB no crackles, wheeze, rhonchi ?Ext: trace edema left > right ?Skin: warm, dry ? ?  ? ?Assessment and Plan  ? ?#elevated TSH ?S: compliant On thyroid medication-none  ?-has had some weight gain- feels like corresponds to new amiodarone ?Lab Results  ?Component Value Date  ? TSH 7.650 (H) 09/22/2021  ? A/P:transient hopefully elevations in TSH- could be related to starting amiodarone- plan to recheck today TSH as well as free T3 and Free T4, usually wouldn't treat unless over 10 regularly.  ? ?# Atrial fibrillation ?#Chronic anticoagulation/secondary hypercoagulable state ?S: Antiarrhythmic-amiodarone per cardiology on wean but staying in rhythm ?Rate controlled with diltiazem 120 mg extended release. Anticoagulated with Eliquis  5 mg twice daily ?- recently when he has had a fib was bothering him a lot- very tired, heart races/flutters, tired heavily by end of day ?A/P: Appropriately anticoagulated-currently appears to be at least rate controlled (cannot disprove A-fib without EKG).  For now continue current medication ? ?#hyperlipidemia-LDL goal under 70-under 55 even more ideal ?S: Medication: Rosuvastatin 20 mg every other daily recently- in the past had been daily ?- was not having side effects- just preferred to cut back ?Lab Results  ?Component Value Date  ? CHOL 103 10/16/2019  ? HDL 46 10/16/2019  ? [place] 43 10/16/2019  ?  LDLDIRECT 96.1 09/02/2013  ? TRIG 65 10/16/2019  ? CHOLHDL 2.2 10/16/2019  ? A/P: lipids have bene well controlled- he reduced dose about 50% so we will check lipids to see where things stand- for now continue current meds. If LDL still below 70 may send in as just every other day ? ?#Migraines ?S: Frequency had improved since patient had weight loss- some weight gain. He uses Maxalt 10 mg as needed-typically once every 2 months  ?A/P: doing really well- continue current meds  ? ?#Thrombocytopenia-patient with mild intermittent thrombocytopenia. Rest of cell lines are normal. Continue to monitor at least yearly - not present on recent check though ?Lab Results  ?Component Value Date  ? WBC 5.5 08/24/2021  ? HGB 15.3 08/24/2021  ? HCT 42.8 08/24/2021  ? MCV 88 08/24/2021  ? PLT 171 08/24/2021  ? ?#History of melanoma-followed agreed with dermatology specialist every 6 months  ? ?Recommended follow up: Return in about 6 months (around 04/15/2022) for physical or sooner if needed. ?Future Appointments  ?Date Time Provider [place]  ?11/17/2021  9:40 AM Freada Bergeron, MD [facility]  ?03/29/2022  9:15 AM [facility] 3 [id number]  ? ? ?Lab/Order associations: ?  ICD-10-CM   ?1. Elevated TSH  R79.89 TSH  ?  T4, free  ?  T3, free  ?  ?2. PAF (paroxysmal atrial fibrillation) (HCC)  I48.0   ?  ?3. Hyperlipidemia, unspecified hyperlipidemia type  E78.5 Lipid panel  ?  ?4. Thrombocytopenia ([place])  D69.6   ?  ?5. Migraine with aura and without status migrainosus, not intractable  G43.109   ?  ? ?I,Jada Bradford,acting as a scribe for Garret Reddish, MD.,have documented all relevant documentation on the behalf of Garret Reddish, MD,as directed by  Garret Reddish, MD while in the presence of Garret Reddish, MD. ? ?I, Garret Reddish, MD, have reviewed all documentation for this visit. The documentation on 10/13/21 for the exam, diagnosis, procedures, and orders are all accurate and  complete. ? ?Return precautions advised.  ?Garret Reddish, MD ? ? ?

## 2021-10-13 ENCOUNTER — Ambulatory Visit (INDEPENDENT_AMBULATORY_CARE_PROVIDER_SITE_OTHER): Payer: BC Managed Care – PPO | Admitting: Family Medicine

## 2021-10-13 ENCOUNTER — Encounter: Payer: Self-pay | Admitting: Family Medicine

## 2021-10-13 VITALS — BP 110/68 | HR 81 | Temp 97.9°F | Ht 73.5 in | Wt 241.4 lb

## 2021-10-13 DIAGNOSIS — I480 Paroxysmal atrial fibrillation: Secondary | ICD-10-CM

## 2021-10-13 DIAGNOSIS — R7989 Other specified abnormal findings of blood chemistry: Secondary | ICD-10-CM | POA: Diagnosis not present

## 2021-10-13 DIAGNOSIS — E785 Hyperlipidemia, unspecified: Secondary | ICD-10-CM | POA: Diagnosis not present

## 2021-10-13 DIAGNOSIS — D696 Thrombocytopenia, unspecified: Secondary | ICD-10-CM | POA: Diagnosis not present

## 2021-10-13 DIAGNOSIS — G43109 Migraine with aura, not intractable, without status migrainosus: Secondary | ICD-10-CM

## 2021-10-13 NOTE — Patient Instructions (Addendum)
Mychart [place] your latest COVID vaccine. ? ?Please stop by lab before you go ?If you have mychart- we will send your results within 3 business days of [place] receiving them.  ?If you do not have mychart- we will call you about results within 5 business days of [place] receiving them.  ?*please also note that you will see labs on mychart as soon as they post. I will later go in and write notes on them- will say "notes from Dr. Yong Channel"  ? ?Recommended follow up: Return in about 6 months (around 04/15/2022) for physical or sooner if needed.  ?

## 2021-10-14 LAB — T4, FREE: Free T4: 1.1 ng/dL (ref 0.8–1.8)

## 2021-10-14 LAB — LIPID PANEL
Cholesterol: 181 mg/dL (ref <200)
HDL: 42 mg/dL (ref >=40)
LDL Cholesterol (Calc): 112 mg/dL (calc) — ABNORMAL HIGH
Non-HDL Cholesterol (Calc): 139 mg/dL (calc) — ABNORMAL HIGH (ref <130)
Total CHOL/HDL Ratio: 4.3 (calc) (ref <5.0)
Triglycerides: 156 mg/dL — ABNORMAL HIGH (ref <150)

## 2021-10-14 LAB — TSH: TSH: 5.59 mIU/L — ABNORMAL HIGH (ref 0.40–4.50)

## 2021-10-14 LAB — T3, FREE: T3, Free: 3 pg/mL (ref 2.3–4.2)

## 2021-11-12 NOTE — Progress Notes (Deleted)
?Cardiology Office Note:   ? ?Date:  11/12/2021  ? ?ID:  Jorge Kelly, DOB [birth year], MRN [id number] ? ?PCP:  [name], MD ?  ?[facility] Providers ?Cardiologist:  Ena Dawley, MD    ? ?Referring MD: [name], MD  ? ? ?History of Present Illness:   ? ?Jorge Kelly is a 66 y.o. male with a hx of pAfib and GERD who was previously followed by Dr. [name] who now presents to clinic for follow-up. ? ?Patient has a long-standing history of atrial fibrillation for which he was started on flecainide. During  visit on 12/28/20 he was in Afib after running out of his medications for several days.  ? ?Was seen by me on 02/27/21 where he was symptomatic in Afib. We placed monitor which showed 100% burden of Afib. We set him up for DCCV on 04/13/21 where he received shock x2 at 200J without success. He subsequently saw Dr. [name] and underwent Afib ablation on 06/15/21. He was seen back in Afib clinic on 07/13/21 with recurrent Afib. He then underwent DCCV in 08/01/21 with return to NSR. ? ?Was seen in 08/2021 where he developed recurrent Afib after COVID. We started amiodarone and he chemically converted to NSR. ? ?Last saw Dr. [name] in clinic on 09/22/21 where he was doing well. Remained in NSR. Wished to continue the amiodarone. ? ?Today, *** ? ?Past Medical History:  ?Diagnosis Date  ? Actinic keratosis 10/24/2009  ? ALLERGIC RHINITIS 05/11/2007  ? ELEVATED BLOOD PRESSURE WITHOUT DIAGNOSIS OF HYPERTENSION 11/12/2007  ? Dr. Debara Pickett mentioned HTN on problem list  ? Esophageal stricture   ? treated with protonix  ? GERD 05/11/2007  ? GI bleeding   ? 20 years ago  ? Kidney stone   ? Melanoma ([place]) 01/2014  ? remoted from forehead  ? MIGRAINE HEADACHE 05/11/2007  ? Overweight   ? Persistent atrial fibrillation ([place]) 05/2014  ? Snores   ? ? ?Past Surgical History:  ?Procedure Laterality Date  ? ANKLE FRACTURE SURGERY  2010  ? x2-left  ? ATRIAL FIBRILLATION ABLATION N/A 06/15/2021  ? Procedure: ATRIAL  FIBRILLATION ABLATION;  Surgeon: [name], MD;  Location: [facility];  Service: Cardiovascular;  Laterality: N/A;  ? CARDIOVERSION N/A 06/23/2014  ? CARDIOVERSION N/A 09/09/2014  ? CARDIOVERSION N/A 04/13/2021  ? Procedure: CARDIOVERSION;  Surgeon: [name], MD;  Location: [facility];  Service: Cardiovascular;  Laterality: N/A;  ? CARDIOVERSION N/A 08/01/2021  ? Procedure: CARDIOVERSION;  Surgeon: [name], MD;  Location: [facility] [facility];  Service: Cardiovascular;  Laterality: N/A;  ? malignant melanoma    ? removed from forehead in June 2015  ? STRABISMUS SURGERY Right 05/21/2014  ? Dr. Annamaria Boots  ? UPPER GI ENDOSCOPY    ? stricture, just PPI  ? ? ?Current Medications: ?No outpatient medications have been marked as taking for the 11/17/21 encounter (Appointment) with Freada Bergeron, MD.  ?  ? ?Allergies:   Patient has no known allergies.  ? ?Social History  ? ?Socioeconomic History  ? Marital status: Married  ?  Spouse name: Not on file  ? Number of children: Not on file  ? Years of education: Not on file  ? Highest education level: Not on file  ?Occupational History  ? Occupation: Forensic psychologist  ?Tobacco Use  ? Smoking status: Never  ?  Passive exposure: Past  ? Smokeless tobacco: Never  ?Vaping Use  ? Vaping Use: Never used  ?Substance and Sexual  Activity  ? Alcohol use: No  ?  Alcohol/week: 0.0 standard drinks  ? Drug use: No  ? Sexual activity: Not on file  ?Other Topics Concern  ? Not on file  ?Social History Narrative  ? Lives in [place] with wife (wife patient at [employer]).  2 sons.- 42 Logan at [employer] and [place] at [employer] in [place]  Wife with pregnancy at 13 and 39.   ?   ? Trial lawyer. Criminal defense  ?   ? Hobbies: sails usually in [place]   ? ?Social Determinants of Health  ? ?Financial Resource Strain: Not on file  ?Food Insecurity: Not on file  ?Transportation Needs: Not on file  ?Physical Activity: Not on file  ?Stress: Not on file  ?Social  Connections: Not on file  ?  ? ?Family History: ?The patient's family history includes Atrial fibrillation in his mother; Cancer in his sister; Diabetes type II in his sister; Heart disease in his maternal uncle; Leukemia in his father; Lymphoma in his mother. ? ?ROS:   ?Please see the history of present illness.    ?Review of Systems  ?Constitutional:  Positive for malaise/fatigue. Negative for chills and fever.  ?HENT:  Positive for congestion. Negative for sore throat.   ?Eyes:  Negative for blurred vision.  ?Respiratory:  Negative for shortness of breath.   ?Cardiovascular:  Positive for chest pain and palpitations. Negative for orthopnea, claudication, leg swelling and PND.  ?Gastrointestinal:  Negative for blood in stool, melena, nausea and vomiting.  ?Genitourinary:  Negative for hematuria.  ?Musculoskeletal:  Negative for falls.  ?Skin:  Negative for itching and rash.  ?Neurological:  Positive for dizziness. Negative for loss of consciousness.  ?Endo/Heme/Allergies:  Does not bruise/bleed easily.  ?Psychiatric/Behavioral:  Negative for substance abuse. The patient has insomnia.   ? ?EKGs/Labs/Other Studies Reviewed:   ? ?The following studies were reviewed today: ?Echo 03/28/21 demonstrated  ? 1. Left ventricular ejection fraction, by estimation, is 65 to 70%. The left ventricle has normal function. The left ventricle has no regional wall motion abnormalities. Left ventricular diastolic function could not be evaluated.  ? 2. Right ventricular systolic function is normal. The right ventricular  ?size is normal.  ? 3. Left atrial size was severely dilated.  ? 4. Right atrial size was moderately dilated.  ? 5. The mitral valve is grossly normal. Trivial mitral valve  ?regurgitation. No evidence of mitral stenosis.  ? 6. The aortic valve is tricuspid. Aortic valve regurgitation is mild.  ? 7. Aortic dilatation noted. There is mild dilatation of the ascending  ?aorta, measuring 40 mm.  ? 8. The inferior vena cava  is normal in size with greater than 50%  ?respiratory variability, suggesting right atrial pressure of 3 mmHg.  ? ?Comparison(s): Prior images unable to be directly viewed, comparison made  ?by report only.  ? ?Conclusion(s)/Recommendation(s): Otherwise normal echocardiogram, with  ?minor abnormalities described in the report. Mild enlargement of ascending  ?aorta, previously noted as normal. Consider repeat imaging in 6-12 months.  ? ?Coronary Calcium Score 09/2019: ?FINDINGS: ?Non-cardiac: See separate report from [facility]. ?  ?Ascending Aorta: Normal size, minimal calcifications. ?  ?Pericardium: Normal. ?  ?Coronary arteries: Normal origin. ?  ?IMPRESSION: ?Coronary calcium score of 947. This was 95 percentile for age and ?sex matched control. ? ?TTE 2015: ?Study Conclusions  ? ?- Left ventricle: The cavity size was normal. There was mild  ?  concentric hypertrophy. Systolic function was normal. The  ?  estimated ejection fraction was in the range of 50% to 55%. Wall  ?  motion was normal; there were no regional wall motion  ?  abnormalities. The study was not technically sufficient to allow  ?  evaluation of LV diastolic dysfunction due to atrial  ?  fibrillation.  ?- Aortic valve: Structurally normal valve. There was mild  ?  regurgitation.  ?- Ascending aorta: The ascending aorta was normal in size.  ?- Mitral valve: Structurally normal valve. There was mild  ?  regurgitation.  ?- Left atrium: The atrium was moderately dilated.  ?- Right ventricle: The cavity size was mildly dilated. Wall  ?  thickness was normal. Systolic function was normal.  ?- Right atrium: The atrium was mildly dilated.  ?- Tricuspid valve: There was mild regurgitation.  ?- Pulmonic valve: There was no regurgitation.  ?- Pulmonary arteries: Systolic pressure was within the normal  ?  range.  ?- Inferior vena cava: The vessel was dilated. The respirophasic  ?  diameter changes were blunted (< 50%), consistent with elevated  ?   central venous pressure.  ?- Pericardium, extracardiac: The pericardium was normal in  ?  appearance. ? ?Myoview 2015: ?Normal stress with no infarct or ischemia ? ? ?EKG:   ?08/17/21: Afib, rate 111 bpm ?02/25/21: Terrilyn Saver

## 2021-11-17 ENCOUNTER — Encounter: Payer: Self-pay | Admitting: Cardiology

## 2021-11-17 ENCOUNTER — Ambulatory Visit (INDEPENDENT_AMBULATORY_CARE_PROVIDER_SITE_OTHER): Payer: BC Managed Care – PPO | Admitting: Cardiology

## 2021-11-17 VITALS — BP 130/78 | HR 88 | Ht 73.5 in | Wt 242.6 lb

## 2021-11-17 DIAGNOSIS — I10 Essential (primary) hypertension: Secondary | ICD-10-CM

## 2021-11-17 DIAGNOSIS — I2510 Atherosclerotic heart disease of native coronary artery without angina pectoris: Secondary | ICD-10-CM | POA: Diagnosis not present

## 2021-11-17 DIAGNOSIS — D6869 Other thrombophilia: Secondary | ICD-10-CM

## 2021-11-17 DIAGNOSIS — I77819 Aortic ectasia, unspecified site: Secondary | ICD-10-CM | POA: Diagnosis not present

## 2021-11-17 DIAGNOSIS — I4819 Other persistent atrial fibrillation: Secondary | ICD-10-CM | POA: Diagnosis not present

## 2021-11-17 DIAGNOSIS — E782 Mixed hyperlipidemia: Secondary | ICD-10-CM

## 2021-11-17 NOTE — Patient Instructions (Signed)
Medication Instructions:  ? ?Your physician recommends that you continue on your current medications as directed. Please refer to the Current Medication list given to you today. ? ? ?*If you need a refill on your cardiac medications before your next appointment, please call your pharmacy* ? ? ? ?Testing/Procedures: ? ? ?Keep echo appointment.  ? ? ?Follow-Up: ?At [facility], you and your health needs are our priority.  As part of our continuing mission to provide you with exceptional heart care, we have created designated Provider Care Teams.  These Care Teams include your primary Cardiologist (physician) and Advanced Practice Providers (APPs -  Physician Assistants and Nurse Practitioners) who all work together to provide you with the care you need, when you need it. ? ?We recommend signing up for the patient portal called "MyChart".  Sign up information is provided on this After Visit Summary.  MyChart is used to connect with patients for Virtual Visits (Telemedicine).  Patients are able to view lab/test results, encounter notes, upcoming appointments, etc.  Non-urgent messages can be sent to your provider as well.   ?To learn more about what you can do with MyChart, go to [email or web address].   ? ?Your next appointment:   ?6 month(s) ? ?The format for your next appointment:   ?In Person ? ?Provider:   ?Dr. Gwyndolyn Kaufman.  ? ?If primary card or EP is not listed click here to update    :1}  ? ? ?Important Information About Sugar ? ? ? ? ?  ?

## 2021-11-17 NOTE — Progress Notes (Signed)
?Cardiology Office Note:   ? ?Date:  11/17/2021  ? ?ID:  Jorge Kelly, DOB [birth year], MRN [id number] ? ?PCP:  [name], MD ?  ?[facility] Providers ?Cardiologist:  Ena Dawley, MD    ? ?Referring MD: [name], MD  ? ? ?History of Present Illness:   ? ?Jorge Kelly is a 66 y.o. male with a hx of pAfib and GERD who was previously followed by Dr. [name] who now presents to clinic for follow-up. ? ?Patient has a long-standing history of atrial fibrillation for which he was started on flecainide. During  visit on 12/28/20 he was in Afib after running out of his medications for several days.  ? ?Was seen by me on 02/27/21 where he was symptomatic in Afib. We placed monitor which showed 100% burden of Afib. We set him up for DCCV on 04/13/21 where he received shock x2 at 200J without success. He subsequently saw Dr. [name] and underwent Afib ablation on 06/15/21. He was seen back in Afib clinic on 07/13/21 with recurrent Afib. He then underwent DCCV in 08/01/21 with return to NSR. ? ?Was seen in 08/2021 where he developed recurrent Afib after COVID. We started amiodarone and he chemically converted to NSR. ? ?Last saw Dr. [name] in clinic on 09/22/21 where he was doing well. Remained in NSR. Wished to continue the amiodarone. ? ?Today, the patient states he is doing good and feeling great. Generally he has a lot of energy. Very brief episodes of palpitations but no sustained episodes of Afib that he is aware of. He is currently on amiodarone '200mg'$  daily which is working well. Hoping to be weaned off this soon.  ? ?His main concern today is his weight. In 2015 he lost about 50 lbs but recently gained his weight back. He says he will cut out sugars and eat healthier/smaller portions to lose weight. ? ?He denies any palpitations, chest pain, shortness of breath, or peripheral edema. No lightheadedness, headaches, syncope, orthopnea, or PND.  ? ? ?Past Medical History:  ?Diagnosis Date  ? Actinic  keratosis 10/24/2009  ? ALLERGIC RHINITIS 05/11/2007  ? ELEVATED BLOOD PRESSURE WITHOUT DIAGNOSIS OF HYPERTENSION 11/12/2007  ? Dr. Debara Pickett mentioned HTN on problem list  ? Esophageal stricture   ? treated with protonix  ? GERD 05/11/2007  ? GI bleeding   ? 20 years ago  ? Kidney stone   ? Melanoma ([place]) 01/2014  ? remoted from forehead  ? MIGRAINE HEADACHE 05/11/2007  ? Overweight   ? Persistent atrial fibrillation ([place]) 05/2014  ? Snores   ? ? ?Past Surgical History:  ?Procedure Laterality Date  ? ANKLE FRACTURE SURGERY  2010  ? x2-left  ? ATRIAL FIBRILLATION ABLATION N/A 06/15/2021  ? Procedure: ATRIAL FIBRILLATION ABLATION;  Surgeon: [name], MD;  Location: [facility] [place] [facility];  Service: Cardiovascular;  Laterality: N/A;  ? CARDIOVERSION N/A 06/23/2014  ? CARDIOVERSION N/A 09/09/2014  ? CARDIOVERSION N/A 04/13/2021  ? Procedure: CARDIOVERSION;  Surgeon: [name], MD;  Location: [facility];  Service: Cardiovascular;  Laterality: N/A;  ? CARDIOVERSION N/A 08/01/2021  ? Procedure: CARDIOVERSION;  Surgeon: [name], MD;  Location: [place];  Service: Cardiovascular;  Laterality: N/A;  ? malignant melanoma    ? removed from forehead in June 2015  ? STRABISMUS SURGERY Right 05/21/2014  ? Dr. Annamaria Boots  ? UPPER GI ENDOSCOPY    ? stricture, just PPI  ? ? ?Current Medications: ?Current Meds  ?Medication Sig  ?  acetaminophen (TYLENOL) 500 MG tablet Take 1,000 mg by mouth every 6 (six) hours as needed for moderate pain or headache.  ? amiodarone (PACERONE) 200 MG tablet Take 1 tablet (200 mg total) by mouth 2 (two) times daily for 12 days, THEN 1 tablet (200 mg total) daily.  ? apixaban (ELIQUIS) 5 MG TABS tablet Take 1 tablet (5 mg total) by mouth 2 (two) times daily.  ? diltiazem (CARDIZEM CD) 120 MG 24 hr capsule Take 1 capsule (120 mg total) by mouth 2 (two) times daily. May take an extra tablet daily for breakthrough afib  ? fexofenadine (ALLEGRA) 180 MG tablet Take 180 mg by mouth daily as  needed for allergies or rhinitis.  ? rizatriptan (MAXALT-MLT) 10 MG disintegrating tablet TAKE DAILY AS NEEDED FOR MIGRAINE MAY REPEAT IN 2 HOURS IF NEEDED X1  ? rosuvastatin (CRESTOR) 20 MG tablet Take 1 tablet (20 mg total) by mouth daily.  ?  ? ?Allergies:   Patient has no known allergies.  ? ?Social History  ? ?Socioeconomic History  ? Marital status: Married  ?  Spouse name: Not on file  ? Number of children: Not on file  ? Years of education: Not on file  ? Highest education level: Not on file  ?Occupational History  ? Occupation: Forensic psychologist  ?Tobacco Use  ? Smoking status: Never  ?  Passive exposure: Past  ? Smokeless tobacco: Never  ?Vaping Use  ? Vaping Use: Never used  ?Substance and Sexual Activity  ? Alcohol use: No  ?  Alcohol/week: 0.0 standard drinks  ? Drug use: No  ? Sexual activity: Not on file  ?Other Topics Concern  ? Not on file  ?Social History Narrative  ? Lives in [place] with wife (wife patient at [employer]).  2 sons.- 27 Logan at [employer] and [place] at [employer] in [place]  Wife with pregnancy at 84 and 39.   ?   ? Trial lawyer. Criminal defense  ?   ? Hobbies: sails usually in [place]   ? ?Social Determinants of Health  ? ?Financial Resource Strain: Not on file  ?Food Insecurity: Not on file  ?Transportation Needs: Not on file  ?Physical Activity: Not on file  ?Stress: Not on file  ?Social Connections: Not on file  ?  ? ?Family History: ?The patient's family history includes Atrial fibrillation in his mother; Cancer in his sister; Diabetes type II in his sister; Heart disease in his maternal uncle; Leukemia in his father; Lymphoma in his mother. ? ?ROS:   ?Please see the history of present illness.    ?Review of Systems  ?Constitutional:  Negative for chills, fever and malaise/fatigue.  ?HENT:  Negative for congestion and sore throat.   ?Eyes:  Negative for blurred vision.  ?Respiratory:  Negative for shortness of breath.   ?Cardiovascular:  Negative for chest pain, palpitations,  orthopnea, claudication, leg swelling and PND.  ?Gastrointestinal:  Negative for blood in stool, melena, nausea and vomiting.  ?Genitourinary:  Negative for hematuria.  ?Musculoskeletal:  Negative for falls.  ?Skin:  Negative for itching and rash.  ?Neurological:  Negative for dizziness and loss of consciousness.  ?Endo/Heme/Allergies:  Does not bruise/bleed easily.  ?Psychiatric/Behavioral:  Negative for substance abuse. The patient does not have insomnia.   ? ?EKGs/Labs/Other Studies Reviewed:   ? ?The following studies were reviewed today: ? ?Atrial Fibrillation Ablation 06/15/2021: ?CONCLUSIONS: ?1. Atrial fibrillation upon presentation.   ?2. Intracardiac echo reveals a large sized left atrium with four  separate pulmonary veins without evidence of pulmonary vein stenosis. ?3. Successful electrical isolation and anatomical encircling of all four pulmonary veins with radiofrequency current.  A WACA approach was used ?3. Additional left atrial ablation was performed with a standard box lesion created along the posterior wall of the left atrium ?4. Atrial fibrillation successfully cardioverted to sinus rhythm. ?5. No early apparent complications. ? ?Cardiac CT 06/08/2021: ?FINDINGS: ?A 120 kV prospective scan was triggered in the descending thoracic ?aorta at 111 HU's. Gantry rotation speed was 280 msecs and ?collimation was .9 mm. No beta blockade and no NTG was given. The 3D ?data set was reconstructed in 5% intervals of the 0-95 % of the R-R ?cycle. Diastolic phases were analyzed on a dedicated work station ?using MPR, MIP and VRT modes. The patient received 59m OMNIPAQUE ?IOHEXOL 350 MG/ML SOLN. ?  ?There is normal variant pulmonary vein drainage into the left atrium ?(3 on the right and 2 on the left) with ostial measurements as ?follows: ?  ?RUPV: 28.4 x 20.6 mm, area 4.60 cm2 ?  ?RMLPV: 10.0 x 9.3 mm, area 0.7 cm2 ?  ?RLPV: 25.8 x 22.9 mm, area 4.51 cm2 ?  ?LUPV: 27.4 x 15.8 mm, area 3.62 cm2 ?  ?LLPV: 25.3  x 21.0 mm, area 4.12 cm2 ?  ?No anomalous pulmonary venous drainage. No pulmonary vein stenosis. ?  ?Normal left atrial appendage, no left atrial appendage thrombus. No ?intracardiac mass or thrombus. ?  ?

## 2021-12-26 ENCOUNTER — Other Ambulatory Visit (HOSPITAL_COMMUNITY): Payer: Self-pay | Admitting: Physician Assistant

## 2021-12-26 DIAGNOSIS — I10 Essential (primary) hypertension: Secondary | ICD-10-CM

## 2021-12-26 DIAGNOSIS — I4819 Other persistent atrial fibrillation: Secondary | ICD-10-CM

## 2021-12-27 ENCOUNTER — Ambulatory Visit: Payer: BC Managed Care – PPO | Admitting: Internal Medicine

## 2021-12-27 MED ORDER — DILTIAZEM HCL ER COATED BEADS 120 MG PO CP24: 120.0000 mg | ORAL_CAPSULE | Freq: Two times a day (BID) | ORAL | 2 refills | Status: DC

## 2022-01-17 DIAGNOSIS — H66003 Acute suppurative otitis media without spontaneous rupture of ear drum, bilateral: Secondary | ICD-10-CM | POA: Diagnosis not present

## 2022-01-17 DIAGNOSIS — J029 Acute pharyngitis, unspecified: Secondary | ICD-10-CM | POA: Diagnosis not present

## 2022-01-17 DIAGNOSIS — J020 Streptococcal pharyngitis: Secondary | ICD-10-CM | POA: Diagnosis not present

## 2022-01-29 ENCOUNTER — Other Ambulatory Visit: Payer: Self-pay

## 2022-01-29 MED ORDER — AMIODARONE HCL 200 MG PO TABS: 200.0000 mg | ORAL_TABLET | Freq: Every day | ORAL | 2 refills | Status: DC

## 2022-02-14 ENCOUNTER — Other Ambulatory Visit: Payer: Self-pay

## 2022-02-14 ENCOUNTER — Ambulatory Visit (INDEPENDENT_AMBULATORY_CARE_PROVIDER_SITE_OTHER): Payer: BC Managed Care – PPO | Admitting: Internal Medicine

## 2022-02-14 ENCOUNTER — Encounter: Payer: Self-pay | Admitting: Internal Medicine

## 2022-02-14 VITALS — BP 122/78 | HR 91 | Ht 73.5 in | Wt 245.2 lb

## 2022-02-14 DIAGNOSIS — Z8249 Family history of ischemic heart disease and other diseases of the circulatory system: Secondary | ICD-10-CM

## 2022-02-14 DIAGNOSIS — I10 Essential (primary) hypertension: Secondary | ICD-10-CM

## 2022-02-14 DIAGNOSIS — I2510 Atherosclerotic heart disease of native coronary artery without angina pectoris: Secondary | ICD-10-CM

## 2022-02-14 DIAGNOSIS — I480 Paroxysmal atrial fibrillation: Secondary | ICD-10-CM

## 2022-02-14 DIAGNOSIS — I4819 Other persistent atrial fibrillation: Secondary | ICD-10-CM | POA: Diagnosis not present

## 2022-02-14 DIAGNOSIS — E785 Hyperlipidemia, unspecified: Secondary | ICD-10-CM

## 2022-02-14 DIAGNOSIS — Z5181 Encounter for therapeutic drug level monitoring: Secondary | ICD-10-CM

## 2022-02-14 MED ORDER — DILTIAZEM HCL ER COATED BEADS 120 MG PO CP24: 120.0000 mg | ORAL_CAPSULE | Freq: Every day | ORAL | 3 refills | Status: DC

## 2022-02-14 MED ORDER — ROSUVASTATIN CALCIUM 20 MG PO TABS: 20.0000 mg | ORAL_TABLET | Freq: Every day | ORAL | 2 refills | Status: DC

## 2022-02-14 MED ORDER — AMIODARONE HCL 200 MG PO TABS: 100.0000 mg | ORAL_TABLET | Freq: Every day | ORAL | 3 refills | Status: DC

## 2022-02-14 MED ORDER — APIXABAN 5 MG PO TABS: 5.0000 mg | ORAL_TABLET | Freq: Two times a day (BID) | ORAL | 3 refills | Status: DC

## 2022-02-14 NOTE — Patient Instructions (Addendum)
Medication Instructions:  Your physician has recommended you make the following change in your medication:   DECREASE Diltiazem 120 Mg- Take ONE tablet by mouth daily.    2.  DECREASE Amiodarone 100 Mg - Take one tablet by mouth daily.    Lab Work: None ordered. If you have labs (blood work) drawn today and your tests are completely normal, you will receive your results only by: [place] (if you have MyChart) OR A paper copy in the mail If you have any lab test that is abnormal or we need to change your treatment, we will call you to review the results.  Testing/Procedures: Please schedule patient for PFTs with [facility]rometry at [facility].   Follow-Up: At [facility], you and your health needs are our priority.  As part of our continuing mission to provide you with exceptional heart care, we have created designated Provider Care Teams.  These Care Teams include your primary Cardiologist (physician) and Advanced Practice Providers (APPs -  Physician Assistants and Nurse Practitioners) who all work together to provide you with the care you need, when you need it.  Your next appointment:   Your physician wants you to follow-up in: 6 months Atrial Fib clinic  You will receive a reminder letter in the mail two months in advance. If you don't receive a letter, please call our office to schedule the follow-up appointment.   Important Information About Sugar

## 2022-02-14 NOTE — Addendum Note (Signed)
Addended by: Leonidas Romberg on: 02/14/2022 08:08 AM   Modules accepted: Orders

## 2022-02-14 NOTE — Telephone Encounter (Signed)
Prescription refill request for Eliquis received. Indication: Afib  Last office visit: 11/17/21 Jorge Kelly) Scr: 1.00 (09/22/21)  Age: 66 Weight: 110kg  Appropriate dose and refill sent to requested pharmacy.

## 2022-02-14 NOTE — Progress Notes (Signed)
PCP: [name], MD Primary Cardiologist: Dr Johney Frame Primary EP: Dr Abby Potash is a 66 y.o. male who presents today for routine electrophysiology followup.  Since last being seen in our clinic, the patient reports doing very well.  Today, he denies symptoms of palpitations, chest pain, shortness of breath,  lower extremity edema, dizziness, presyncope, or syncope.  The patient is otherwise without complaint today.   Past Medical History:  Diagnosis Date   Actinic keratosis 10/24/2009   ALLERGIC RHINITIS 05/11/2007   ELEVATED BLOOD PRESSURE WITHOUT DIAGNOSIS OF HYPERTENSION 11/12/2007   Dr. Debara Pickett mentioned HTN on problem list   Esophageal stricture    treated with protonix   GERD 05/11/2007   GI bleeding    20 years ago   Kidney stone    Melanoma ([place]) 01/2014   remoted from forehead   MIGRAINE HEADACHE 05/11/2007   Overweight    Persistent atrial fibrillation ([place]) 05/2014   Snores    Past Surgical History:  Procedure Laterality Date   ANKLE FRACTURE SURGERY  2010   x2-left   ATRIAL FIBRILLATION ABLATION N/A 06/15/2021   Procedure: ATRIAL FIBRILLATION ABLATION;  Surgeon: [name], MD;  Location: [facility] [place] [facility];  Service: Cardiovascular;  Laterality: N/A;   CARDIOVERSION N/A 06/23/2014   CARDIOVERSION N/A 09/09/2014   CARDIOVERSION N/A 04/13/2021   Procedure: CARDIOVERSION;  Surgeon: [name], MD;  Location: [facility];  Service: Cardiovascular;  Laterality: N/A;   CARDIOVERSION N/A 08/01/2021   Procedure: CARDIOVERSION;  Surgeon: [name], MD;  Location: [facility];  Service: Cardiovascular;  Laterality: N/A;   malignant melanoma     removed from forehead in June 2015   STRABISMUS SURGERY Right 05/21/2014   Dr. Annamaria Boots   UPPER GI ENDOSCOPY     stricture, just PPI    ROS- all systems are reviewed and negatives except as per HPI above  Current Outpatient Medications  Medication Sig Dispense Refill   acetaminophen  (TYLENOL) 500 MG tablet Take 1,000 mg by mouth every 6 (six) hours as needed for moderate pain or headache.     amiodarone (PACERONE) 200 MG tablet Take 1 tablet (200 mg total) by mouth daily. 90 tablet 2   apixaban (ELIQUIS) 5 MG TABS tablet Take 1 tablet (5 mg total) by mouth 2 (two) times daily. 180 tablet 3   diltiazem (CARDIZEM CD) 120 MG 24 hr capsule Take 1 capsule (120 mg total) by mouth 2 (two) times daily. May take an extra tablet daily for breakthrough afib 225 capsule 2   fexofenadine (ALLEGRA) 180 MG tablet Take 180 mg by mouth daily as needed for allergies or rhinitis.     rizatriptan (MAXALT-MLT) 10 MG disintegrating tablet TAKE DAILY AS NEEDED FOR MIGRAINE MAY REPEAT IN 2 HOURS IF NEEDED X1 9 tablet 2   rosuvastatin (CRESTOR) 20 MG tablet Take 1 tablet (20 mg total) by mouth daily. 90 tablet 2   No current facility-administered medications for this visit.    Physical Exam: Vitals:   02/14/22 1444  BP: 122/78  Pulse: 91  SpO2: 96%  Weight: 245 lb 3.2 oz (111.2 kg)  Height: 6' 1.5" (1.867 m)    GEN- The patient is well appearing, alert and oriented x 3 today.   Head- normocephalic, atraumatic Eyes-  Sclera clear, conjunctiva pink Ears- hearing intact Oropharynx- clear Lungs- Clear to ausculation bilaterally, normal work of breathing Heart- Regular rate and rhythm, no murmurs, rubs or gallops, PMI not laterally displaced  GI- soft, NT, ND, + BS Extremities- no clubbing, cyanosis, or edema  Wt Readings from Last 3 Encounters:  02/14/22 245 lb 3.2 oz (111.2 kg)  11/17/21 242 lb 9.6 oz (110 kg)  10/13/21 241 lb 6.4 oz (109.5 kg)    EKG tracing ordered today is personally reviewed and shows sinus rhythm  Assessment and Plan:  Persistent atrial fibrillation Well controlled post ablation On eliquis for chads2vasc score of 0-1 (he denies HTN) I worry about risks of amiodarone long term.  We discussed at length today.  Labs from 3/23 reviewed. Reduce amiodarone to  '100mg'$  daily.  Stop amiodarone on return if no further afib. Reduce diltiazem CD to '120mg'$  daily Consider stopping eliquis on return  Obtain PFTs to evaluate while on amiodarone  Risks, benefits and potential toxicities for medications prescribed and/or refilled reviewed with patient today.   Return in 6 months to AF clinic  [name] MD, [facility] 02/14/2022 2:56 PM

## 2022-03-29 ENCOUNTER — Ambulatory Visit (HOSPITAL_COMMUNITY): Payer: BC Managed Care – PPO | Attending: Cardiology

## 2022-03-29 DIAGNOSIS — I77819 Aortic ectasia, unspecified site: Secondary | ICD-10-CM | POA: Diagnosis not present

## 2022-03-29 DIAGNOSIS — I351 Nonrheumatic aortic (valve) insufficiency: Secondary | ICD-10-CM | POA: Diagnosis not present

## 2022-03-29 LAB — ECHOCARDIOGRAM COMPLETE
Area-P 1/2: 3.68 cm2
P 1/2 time: 728 msec
S' Lateral: 3.1 cm

## 2022-04-11 ENCOUNTER — Other Ambulatory Visit (HOSPITAL_COMMUNITY): Payer: Self-pay | Admitting: Physician Assistant

## 2022-04-11 DIAGNOSIS — I10 Essential (primary) hypertension: Secondary | ICD-10-CM

## 2022-04-11 DIAGNOSIS — I4819 Other persistent atrial fibrillation: Secondary | ICD-10-CM

## 2022-04-30 ENCOUNTER — Encounter: Payer: Self-pay | Admitting: *Deleted

## 2022-05-04 ENCOUNTER — Ambulatory Visit (INDEPENDENT_AMBULATORY_CARE_PROVIDER_SITE_OTHER): Payer: BC Managed Care – PPO | Admitting: Pulmonary Disease

## 2022-05-04 DIAGNOSIS — I4819 Other persistent atrial fibrillation: Secondary | ICD-10-CM | POA: Diagnosis not present

## 2022-05-04 DIAGNOSIS — I10 Essential (primary) hypertension: Secondary | ICD-10-CM

## 2022-05-04 LAB — PULMONARY FUNCTION TEST
DL/VA % pred: 82 %
DL/VA: 3.38 ml/min/mmHg/L
DLCO cor % pred: 96 %
DLCO cor: 28.51 ml/min/mmHg
DLCO unc % pred: 96 %
DLCO unc: 28.51 ml/min/mmHg
FEF 25-75 Post: 4.04 L/sec
FEF 25-75 Pre: 3.78 L/sec
FEF2575-%Change-Post: 6 %
FEF2575-%Pred-Post: 132 %
FEF2575-%Pred-Pre: 123 %
FEV1-%Change-Post: 1 %
FEV1-%Pred-Post: 124 %
FEV1-%Pred-Pre: 122 %
FEV1-Post: 4.82 L
FEV1-Pre: 4.74 L
FEV1FVC-%Change-Post: 2 %
FEV1FVC-%Pred-Pre: 101 %
FEV6-%Change-Post: 0 %
FEV6-%Pred-Post: 125 %
FEV6-%Pred-Pre: 125 %
FEV6-Post: 6.14 L
FEV6-Pre: 6.14 L
FEV6FVC-%Change-Post: 0 %
FEV6FVC-%Pred-Post: 104 %
FEV6FVC-%Pred-Pre: 103 %
FVC-%Change-Post: 0 %
FVC-%Pred-Post: 120 %
FVC-%Pred-Pre: 121 %
FVC-Post: 6.19 L
FVC-Pre: 6.25 L
Post FEV1/FVC ratio: 78 %
Post FEV6/FVC ratio: 99 %
Pre FEV1/FVC ratio: 76 %
Pre FEV6/FVC Ratio: 98 %
RV % pred: 134 %
RV: 3.36 L
TLC % pred: 118 %
TLC: 9.05 L

## 2022-05-04 NOTE — Patient Instructions (Signed)
Full PFT Performed Today  

## 2022-05-04 NOTE — Progress Notes (Signed)
Full PFT Performed Today  

## 2022-05-11 NOTE — Progress Notes (Unsigned)
Cardiology Office Note:    Date:  05/11/2022   ID:  Jorge Kelly, DOB 1956/01/16, MRN [id number]  PCP:  [name], MD   [facility] Providers Cardiologist:  Ena Dawley, MD     Referring MD: [name], MD    History of Present Illness:    Jorge Kelly is a 66 y.o. male with a hx of pAfib and GERD who was previously followed by Dr. [name] who now presents to clinic for follow-up.  Patient has a long-standing history of atrial fibrillation for which he was started on flecainide. During  visit on 12/28/20 he was in Afib after running out of his medications for several days.   Was seen by me on 02/27/21 where he was symptomatic in Afib. We placed monitor which showed 100% burden of Afib. We set him up for DCCV on 04/13/21 where he received shock x2 at 200J without success. He subsequently saw Dr. [name] and underwent Afib ablation on 06/15/21. He was seen back in Afib clinic on 07/13/21 with recurrent Afib. He then underwent DCCV in 08/01/21 with return to NSR.  Was seen in 08/2021 where he developed recurrent Afib after COVID. We started amiodarone and he chemically converted to NSR.  Was last seen by Dr. [name] on 02/14/22 where he was doing well. His amiodarone was reduced to '100mg'$  daily with goal to wean off at next visit. Dikt was also decreased to '120mg'$  daily. If no recurrence of Afib, may consider stopping apixaban as well.   Today, ***   Past Medical History:  Diagnosis Date   Actinic keratosis 10/24/2009   ALLERGIC RHINITIS 05/11/2007   ELEVATED BLOOD PRESSURE WITHOUT DIAGNOSIS OF HYPERTENSION 11/12/2007   Dr. Debara Pickett mentioned HTN on problem list   Esophageal stricture    treated with protonix   GERD 05/11/2007   GI bleeding    20 years ago   Kidney stone    Melanoma ([place]) 01/2014   remoted from forehead   MIGRAINE HEADACHE 05/11/2007   Overweight    Persistent atrial fibrillation ([place]) 05/2014   Snores     Past Surgical History:  Procedure  Laterality Date   ANKLE FRACTURE SURGERY  2010   x2-left   ATRIAL FIBRILLATION ABLATION N/A 06/15/2021   Procedure: ATRIAL FIBRILLATION ABLATION;  Surgeon: [name], MD;  Location: [facility];  Service: Cardiovascular;  Laterality: N/A;   CARDIOVERSION N/A 06/23/2014   CARDIOVERSION N/A 09/09/2014   CARDIOVERSION N/A 04/13/2021   Procedure: CARDIOVERSION;  Surgeon: [name], MD;  Location: [facility];  Service: Cardiovascular;  Laterality: N/A;   CARDIOVERSION N/A 08/01/2021   Procedure: CARDIOVERSION;  Surgeon: [name], MD;  Location: [facility];  Service: Cardiovascular;  Laterality: N/A;   malignant melanoma     removed from forehead in June 2015   STRABISMUS SURGERY Right 05/21/2014   Dr. Annamaria Boots   UPPER GI ENDOSCOPY     stricture, just PPI    Current Medications: No outpatient medications have been marked as taking for the 05/16/22 encounter (Appointment) with Freada Bergeron, MD.     Allergies:   Patient has no known allergies.   Social History   Socioeconomic History   Marital status: Married    Spouse name: Not on file   Number of children: Not on file   Years of education: Not on file   Highest education level: Not on file  Occupational History   Occupation: Attorney  Tobacco Use  Smoking status: Never    Passive exposure: Past   Smokeless tobacco: Never  Vaping Use   Vaping Use: Never used  Substance and Sexual Activity   Alcohol use: No    Alcohol/week: 0.0 standard drinks of alcohol   Drug use: No   Sexual activity: Not on file  Other Topics Concern   Not on file  Social History Narrative   Lives in [place] with wife (wife patient at [facility]).  2 sons.- 59 [place] at [employer] and [place] at [employer] in [place]  Wife with pregnancy at 44 and 39.       Trial lawyer. Criminal defense      Hobbies: sails usually in [place]    Social Determinants of Health   Financial Resource Strain: Not on file   Food Insecurity: Not on file  Transportation Needs: Not on file  Physical Activity: Not on file  Stress: Not on file  Social Connections: Not on file     Family History: The patient's family history includes Atrial fibrillation in his mother; Cancer in his sister; Diabetes type II in his sister; Heart disease in his maternal uncle; Leukemia in his father; Lymphoma in his mother.  ROS:   Please see the history of present illness.    Review of Systems  Constitutional:  Negative for chills, fever and malaise/fatigue.  HENT:  Negative for congestion and sore throat.   Eyes:  Negative for blurred vision.  Respiratory:  Negative for shortness of breath.   Cardiovascular:  Negative for chest pain, palpitations, orthopnea, claudication, leg swelling and PND.  Gastrointestinal:  Negative for blood in stool, melena, nausea and vomiting.  Genitourinary:  Negative for hematuria.  Musculoskeletal:  Negative for falls.  Skin:  Negative for itching and rash.  Neurological:  Negative for dizziness and loss of consciousness.  Endo/Heme/Allergies:  Does not bruise/bleed easily.  Psychiatric/Behavioral:  Negative for substance abuse. The patient does not have insomnia.     EKGs/Labs/Other Studies Reviewed:    The following studies were reviewed today: TTE 03-27-22: IMPRESSIONS     1. Left ventricular ejection fraction, by estimation, is 60 to 65%. The  left ventricle has normal function. The left ventricle has no regional  wall motion abnormalities. There is mild left ventricular hypertrophy.  Left ventricular diastolic parameters  were normal.   2. Right ventricular systolic function is normal. The right ventricular  size is normal. Tricuspid regurgitation signal is inadequate for assessing  PA pressure.   3. The mitral valve is normal in structure. Trivial mitral valve  regurgitation. No evidence of mitral stenosis.   4. The aortic valve is tricuspid. Aortic valve regurgitation is mild.  No  aortic stenosis is present.   5. Aortic dilatation noted. There is dilatation of the aortic root,  measuring 41 mm. There is dilatation of the ascending aorta, measuring 39  mm.   Atrial Fibrillation Ablation 06/15/2021: CONCLUSIONS: 1. Atrial fibrillation upon presentation.   2. Intracardiac echo reveals a large sized left atrium with four separate pulmonary veins without evidence of pulmonary vein stenosis. 3. Successful electrical isolation and anatomical encircling of all four pulmonary veins with radiofrequency current.  A WACA approach was used 3. Additional left atrial ablation was performed with a standard box lesion created along the posterior wall of the left atrium 4. Atrial fibrillation successfully cardioverted to sinus rhythm. 5. No early apparent complications.  Cardiac CT 06/08/2021: FINDINGS: A 120 kV prospective scan was triggered in the descending thoracic aorta  at 111 HU's. Gantry rotation speed was 280 msecs and collimation was .9 mm. No beta blockade and no NTG was given. The 3D data set was reconstructed in 5% intervals of the 0-95 % of the R-R cycle. Diastolic phases were analyzed on a dedicated work station using MPR, MIP and VRT modes. The patient received 62m OMNIPAQUE IOHEXOL 350 MG/ML SOLN.   There is normal variant pulmonary vein drainage into the left atrium (3 on the right and 2 on the left) with ostial measurements as follows:   RUPV: 28.4 x 20.6 mm, area 4.60 cm2   RMLPV: 10.0 x 9.3 mm, area 0.7 cm2   RLPV: 25.8 x 22.9 mm, area 4.51 cm2   LUPV: 27.4 x 15.8 mm, area 3.62 cm2   LLPV: 25.3 x 21.0 mm, area 4.12 cm2   No anomalous pulmonary venous drainage. No pulmonary vein stenosis.   Normal left atrial appendage, no left atrial appendage thrombus. No intracardiac mass or thrombus.   The esophagus runs adjacent to left sided pulmonary veins.   Aorta: Normal caliber, 36 mm at mid ascending aorta. No dissection or calcifications.    Aortic Valve:  Trileaflet.  No calcifications.   Coronary Arteries: Normal coronary origin. Right dominance. The study was performed without use of NTG and insufficient for plaque evaluation. Coronary artery calcium score is 1252, which is 95th percentile for age and sex matched peers.   Pulmonary artery: Normal caliber main pulmonary artery.   IMPRESSION: 1. There is normal variant pulmonary vein drainage into the left atrium. No pulmonary vein stenosis.   2. Normal left atrial appendage, no left atrial appendage thrombus. No intracardiac mass or thrombus.   3. The esophagus runs adjacent to left sided pulmonary veins.   4. Coronary artery calcium score is 1252, which is 95th percentile for age and sex matched peers. Echo 03/28/21 demonstrated   1. Left ventricular ejection fraction, by estimation, is 65 to 70%. The left ventricle has normal function. The left ventricle has no regional wall motion abnormalities. Left ventricular diastolic function could not be evaluated.   2. Right ventricular systolic function is normal. The right ventricular  size is normal.   3. Left atrial size was severely dilated.   4. Right atrial size was moderately dilated.   5. The mitral valve is grossly normal. Trivial mitral valve  regurgitation. No evidence of mitral stenosis.   6. The aortic valve is tricuspid. Aortic valve regurgitation is mild.   7. Aortic dilatation noted. There is mild dilatation of the ascending  aorta, measuring 40 mm.   8. The inferior vena cava is normal in size with greater than 50%  respiratory variability, suggesting right atrial pressure of 3 mmHg.   Comparison(s): Prior images unable to be directly viewed, comparison made  by report only.   Conclusion(s)/Recommendation(s): Otherwise normal echocardiogram, with  minor abnormalities described in the report. Mild enlargement of ascending  aorta, previously noted as normal. Consider repeat imaging in 6-12 months.    Coronary Calcium Score 09/2019: FINDINGS: Non-cardiac: [facility]   Ascending Aorta: Normal size, minimal calcifications.   Pericardium: Normal.   Coronary arteries: Normal origin.   IMPRESSION: Coronary calcium score of 947. This was 95 percentile for age and sex matched control.  TTE 2015: Study Conclusions   - Left ventricle: The cavity size was normal. There was mild    concentric hypertrophy. Systolic function was normal. The    estimated ejection fraction was in the range  of 50% to 55%. Wall    motion was normal; there were no regional wall motion    abnormalities. The study was not technically sufficient to allow    evaluation of LV diastolic dysfunction due to atrial    fibrillation.  - Aortic valve: Structurally normal valve. There was mild    regurgitation.  - Ascending aorta: The ascending aorta was normal in size.  - Mitral valve: Structurally normal valve. There was mild    regurgitation.  - Left atrium: The atrium was moderately dilated.  - Right ventricle: The cavity size was mildly dilated. Wall    thickness was normal. Systolic function was normal.  - Right atrium: The atrium was mildly dilated.  - Tricuspid valve: There was mild regurgitation.  - Pulmonic valve: There was no regurgitation.  - Pulmonary arteries: Systolic pressure was within the normal    range.  - Inferior vena cava: The vessel was dilated. The respirophasic    diameter changes were blunted (< 50%), consistent with elevated    central venous pressure.  - Pericardium, extracardiac: The pericardium was normal in    appearance.  Myoview 2015: Normal stress with no infarct or ischemia   EKG:  EKG is personally reviewed. 11/17/2021: Normal Sinus Rhythm. Rate 88 bpm.  08/17/21: Afib, rate 111 bpm 02/25/21: atrial fibrillation with HR 94  Recent Labs: 08/24/2021: Hemoglobin 15.3; Platelets 171 09/22/2021: ALT 18; BUN 21; Creatinine, Ser 1.00;  Potassium 4.3; Sodium 139 10/13/2021: TSH 5.59  Recent Lipid Panel    Component Value Date/Time   CHOL 181 10/13/2021 1456   CHOL 103 10/16/2019 0830   TRIG 156 (H) 10/13/2021 1456   HDL 42 10/13/2021 1456   HDL 46 10/16/2019 0830   CHOLHDL 4.3 10/13/2021 1456   VLDL 16.2 08/20/2019 1540   LDLCALC 112 (H) 10/13/2021 1456   LDLDIRECT 96.1 09/02/2013 0839     Risk Assessment/Calculations:    CHA2DS2-VASc Score =    {This indicates a  % annual risk of stroke. The patient's score is based upon:       Physical Exam:    VS:  There were no vitals taken for this visit.    Wt Readings from Last 3 Encounters:  02/14/22 245 lb 3.2 oz (111.2 kg)  11/17/21 242 lb 9.6 oz (110 kg)  10/13/21 241 lb 6.4 oz (109.5 kg)     GEN:  Comfortable, NAD HEENT: Normal NECK: No JVD; No carotid bruits CARDIAC: RRR, no murmurs, no rubs, gallops RESPIRATORY:  Clear to auscultation without rales, wheezing or rhonchi  ABDOMEN: Soft, non-tender, non-distended MUSCULOSKELETAL:  No edema; No deformity  SKIN: Warm and dry NEUROLOGIC:  Alert and oriented x 3 PSYCHIATRIC:  Normal affect   ASSESSMENT:    No diagnosis found.   PLAN:    In order of problems listed above:  #Persistent afib: #Chronic Anticoagulation: CHADs-vasc 1 for nonobstructive CAD.Had recurrent episodes of Afib in 02/2021 with unsuccessful DCCV in 03/2021. Now s/p Afib ablation on 06/15/21 with Dr. [name]. Following ablation, patient had recurrence of Afib and underwent repeat DCCV on 08/01/21. Developed COVID post-DCCV and flipped back into Afib that chemically converted with amiodarone. Now attempting to wean off amiodarone.  -Continue amiodarone '100mg'$  daily; goal to wean off by next visit with Dr. [name] -Continue dilt '120mg'$  daily with '60mg'$  as needed for breakthrough -Continue apixaban '5mg'$  BID; may be able to come off pending absence of recurrent AFib  #Non-obstructive CAD with coronary calcium score 947: Asymptomatic with  no  anginal symptoms. -Continue crestor '20mg'$  daily -No ASA due to need for apixaban for [facility]  #HLD: LDL at goal of 43.  -Continue crestor '20mg'$  daily  #Ascending Aortic Aneurysm: Stable on repeat TTE 03/2022 with root measuring 32m, ascending aorta measuring 336m -Continue annual echoes   Follow-up: 6 months  Medication Adjustments/Labs and Tests Ordered: Current medicines are reviewed at length with the patient today.  Concerns regarding medicines are outlined above.  No orders of the defined types were placed in this encounter.  No orders of the defined types were placed in this encounter.    There are no Patient Instructions on file for this visit.   I, HeFreada BergeronMD, have reviewed all documentation for this visit. The documentation on 05/11/22 for the exam, diagnosis, procedures, and orders are all accurate and complete.   I, HeFreada BergeronMD, have reviewed all documentation for this visit. The documentation on 05/11/22 for the exam, diagnosis, procedures, and orders are all accurate and complete.   Signed, HeFreada BergeronMD  05/11/2022 2:22 PM    [facility]

## 2022-05-16 ENCOUNTER — Ambulatory Visit: Payer: BC Managed Care – PPO | Attending: Cardiology | Admitting: Cardiology

## 2022-05-16 ENCOUNTER — Encounter: Payer: Self-pay | Admitting: Cardiology

## 2022-05-16 VITALS — BP 108/70 | HR 88 | Ht 73.0 in | Wt 248.0 lb

## 2022-05-16 DIAGNOSIS — I2584 Coronary atherosclerosis due to calcified coronary lesion: Secondary | ICD-10-CM

## 2022-05-16 DIAGNOSIS — E785 Hyperlipidemia, unspecified: Secondary | ICD-10-CM

## 2022-05-16 DIAGNOSIS — I480 Paroxysmal atrial fibrillation: Secondary | ICD-10-CM | POA: Diagnosis not present

## 2022-05-16 DIAGNOSIS — Z8249 Family history of ischemic heart disease and other diseases of the circulatory system: Secondary | ICD-10-CM | POA: Diagnosis not present

## 2022-05-16 DIAGNOSIS — E782 Mixed hyperlipidemia: Secondary | ICD-10-CM

## 2022-05-16 DIAGNOSIS — D6869 Other thrombophilia: Secondary | ICD-10-CM

## 2022-05-16 DIAGNOSIS — Z5181 Encounter for therapeutic drug level monitoring: Secondary | ICD-10-CM

## 2022-05-16 DIAGNOSIS — I2510 Atherosclerotic heart disease of native coronary artery without angina pectoris: Secondary | ICD-10-CM | POA: Diagnosis not present

## 2022-05-16 DIAGNOSIS — Z79899 Other long term (current) drug therapy: Secondary | ICD-10-CM

## 2022-05-16 DIAGNOSIS — I10 Essential (primary) hypertension: Secondary | ICD-10-CM

## 2022-05-16 DIAGNOSIS — I77819 Aortic ectasia, unspecified site: Secondary | ICD-10-CM

## 2022-05-16 MED ORDER — ROSUVASTATIN CALCIUM 20 MG PO TABS: 20.0000 mg | ORAL_TABLET | Freq: Every day | ORAL | 2 refills | Status: DC

## 2022-05-16 NOTE — Patient Instructions (Signed)
Medication Instructions:   Your physician recommends that you continue on your current medications as directed. Please refer to the Current Medication list given to you today.  *If you need a refill on your cardiac medications before your next appointment, please call your pharmacy*   Lab Work:  IN [address] [place] OFFICE--CHECK LIPIDS--PLEASE COME FASTING TO THIS LAB APPOINTMENT--SCHEDULING PLEASE GO AHEAD AND MAKE APPOINTMENT  If you have labs (blood work) drawn today and your tests are completely normal, you will receive your results only by: MyChart Message (if you have MyChart) OR A paper copy in the mail If you have any lab test that is abnormal or we need to change your treatment, we will call you to review the results.   Testing/Procedures:  Your physician has requested that you have an echocardiogram. Echocardiography is a painless test that uses sound waves to create images of your heart. It provides your doctor with information about the size and shape of your heart and how well your heart's chambers and valves are working. This procedure takes approximately one hour. There are no restrictions for this procedure.  SCHEDULE TO BE DONE IN AUGUST 2024 PER DR. Johney Frame    Follow-Up: At [facility], you and your health needs are our priority.  As part of our continuing mission to provide you with exceptional heart care, we have created designated Provider Care Teams.  These Care Teams include your primary Cardiologist (physician) and Advanced Practice Providers (APPs -  Physician Assistants and Nurse Practitioners) who all work together to provide you with the care you need, when you need it.  We recommend signing up for the patient portal called "MyChart".  Sign up information is provided on this After Visit Summary.  MyChart is used to connect with patients for Virtual Visits (Telemedicine).  Patients are able to view lab/test results, encounter notes, upcoming  appointments, etc.  Non-urgent messages can be sent to your provider as well.   To learn more about what you can do with MyChart, go to [email or web address].    Your next appointment:   6 month(s)  The format for your next appointment:   In Person  Provider:   DR. Johney Frame   Important Information About Sugar

## 2022-07-19 ENCOUNTER — Encounter: Payer: Self-pay | Admitting: *Deleted

## 2022-08-05 ENCOUNTER — Other Ambulatory Visit: Payer: Self-pay | Admitting: Family Medicine

## 2022-08-15 ENCOUNTER — Encounter (HOSPITAL_COMMUNITY): Payer: Self-pay | Admitting: Physician Assistant

## 2022-08-15 ENCOUNTER — Ambulatory Visit (HOSPITAL_COMMUNITY)
Admission: RE | Admit: 2022-08-15 | Discharge: 2022-08-15 | Disposition: A | Discharge status: Home / Self Care | Payer: BC Managed Care – PPO | Source: Ambulatory Visit | Attending: Physician Assistant | Admitting: Physician Assistant

## 2022-08-15 VITALS — BP 136/88 | HR 87 | Ht 73.0 in | Wt 255.6 lb

## 2022-08-15 DIAGNOSIS — I4819 Other persistent atrial fibrillation: Secondary | ICD-10-CM | POA: Diagnosis present

## 2022-08-15 DIAGNOSIS — E669 Obesity, unspecified: Secondary | ICD-10-CM | POA: Diagnosis not present

## 2022-08-15 DIAGNOSIS — I2510 Atherosclerotic heart disease of native coronary artery without angina pectoris: Secondary | ICD-10-CM | POA: Diagnosis not present

## 2022-08-15 DIAGNOSIS — Z7901 Long term (current) use of anticoagulants: Secondary | ICD-10-CM | POA: Diagnosis not present

## 2022-08-15 DIAGNOSIS — Z6833 Body mass index (BMI) 33.0-33.9, adult: Secondary | ICD-10-CM | POA: Insufficient documentation

## 2022-08-15 LAB — COMPREHENSIVE METABOLIC PANEL
ALT: 16 U/L (ref 0–44)
AST: 17 U/L (ref 15–41)
Albumin: 3.9 g/dL (ref 3.5–5.0)
Alkaline Phosphatase: 55 U/L (ref 38–126)
Anion gap: 9 (ref 5–15)
BUN: 12 mg/dL (ref 8–23)
CO2: 20 mmol/L — ABNORMAL LOW (ref 22–32)
Calcium: 8.8 mg/dL — ABNORMAL LOW (ref 8.9–10.3)
Chloride: 110 mmol/L (ref 98–111)
Creatinine, Ser: 0.88 mg/dL (ref 0.61–1.24)
GFR, Estimated: >60 mL/min (ref >60)
Glucose, Bld: 95 mg/dL (ref 70–99)
Potassium: 4.1 mmol/L (ref 3.5–5.1)
Sodium: 139 mmol/L (ref 135–145)
Total Bilirubin: 0.7 mg/dL (ref 0.3–1.2)
Total Protein: 6.6 g/dL (ref 6.5–8.1)

## 2022-08-15 LAB — TSH: TSH: 5.912 u[IU]/mL — ABNORMAL HIGH (ref 0.350–4.500)

## 2022-08-15 NOTE — Patient Instructions (Signed)
Stop amiodarone

## 2022-08-15 NOTE — Progress Notes (Signed)
Primary Care Physician: [name], MD Primary Cardiologist: Dr Johney Frame  Primary Electrophysiologist: Dr [name] Referring Physician: [facility] Triage    Jorge Kelly is a 67 y.o. male with a history of HTN and atrial fibrillation who presents for follow [facility].  The patient was initially diagnosed with atrial fibrillation in 2015. He has been maintained on flecainide. Patient is on Eliquis for a CHADS2VASC score of 1. Patient had done well with little afib until 12/2020 when he was noted to be in afib at his follow up. Patient admits he ran out of his medication. He resumed the medication and a heart monitor was placed to evaluate if he was persistent vs paroxysmal. The monitor showed 100 % afib burden. He is symptomatic with fatigue, palpitations, and dizziness. Patient underwent unsuccessful DCCV on 04/13/21. He was referred to Dr [name] and underwent afib ablation on 06/15/21. He had recurrent afib and underwent DCCV on 08/01/21. He was again in afib on follow up on 08/17/21 and was loaded on amiodarone with plan for repeat DCCV but he chemically converted prior to the procedure.   On follow up today, patient reports that he has done very will with no episodes of afib on low dose amiodarone. No bleeding issues on anticoagulation.   Today, he denies symptoms of palpitations, chest pain, shortness of breath, orthopnea, PND, lower extremity edema, presyncope, syncope, snoring, daytime somnolence, bleeding, or neurologic sequela. The patient is tolerating medications without difficulties and is otherwise without complaint today.    Atrial Fibrillation Risk Factors:  he does not have symptoms or diagnosis of sleep apnea. Negative sleep study 2015. he does not have a history of rheumatic fever. he does not have a history of alcohol use. The patient does not have a history of early familial atrial fibrillation or other arrhythmias.  he has a BMI  of Body mass index is 33.72 kg/m.Marland Kitchen Filed Weights   08/15/22 0912  Weight: 115.9 kg    Family History  Problem Relation Age of Onset   Lymphoma Mother    Atrial fibrillation Mother        pacemaker in 34s   Leukemia Father        passed from [place] Sister        treated, cancer free   Diabetes type II Sister    Heart disease Maternal Uncle        mother lost 6 brothers to heart disease in 11s     Atrial Fibrillation Management history:  Previous antiarrhythmic drugs: Multaq, flecainide, amiodarone  Previous cardioversions: 2015, 2016, 08/01/21 Previous ablations: 06/15/21 CHADS2VASC score: 1 Anticoagulation history: Eliquis   Past Medical History:  Diagnosis Date   Actinic keratosis 10/24/2009   ALLERGIC RHINITIS 05/11/2007   ELEVATED BLOOD PRESSURE WITHOUT DIAGNOSIS OF HYPERTENSION 11/12/2007   Dr. Debara Pickett mentioned HTN on problem list   Esophageal stricture    treated with protonix   GERD 05/11/2007   GI bleeding    20 years ago   Kidney stone    Melanoma ([place]) 01/2014   remoted from forehead   MIGRAINE HEADACHE 05/11/2007   Overweight    Persistent atrial fibrillation ([place]) 05/2014   Snores    Past Surgical History:  Procedure Laterality Date   ANKLE FRACTURE SURGERY  2010   x2-left   ATRIAL FIBRILLATION ABLATION N/A 06/15/2021   Procedure: ATRIAL FIBRILLATION ABLATION;  Surgeon: [name], MD;  Location: [place] [facility] [facility];  Service: Cardiovascular;  Laterality: N/A;   CARDIOVERSION N/A 06/23/2014   CARDIOVERSION N/A 09/09/2014   CARDIOVERSION N/A 04/13/2021   Procedure: CARDIOVERSION;  Surgeon: [name], MD;  Location: [place];  Service: Cardiovascular;  Laterality: N/A;   CARDIOVERSION N/A 08/01/2021   Procedure: CARDIOVERSION;  Surgeon: [name], MD;  Location: [facility];  Service: Cardiovascular;  Laterality: N/A;   malignant melanoma     removed from forehead in June 2015   STRABISMUS SURGERY Right  05/21/2014   Dr. Annamaria Boots   UPPER GI ENDOSCOPY     stricture, just PPI    Current Outpatient Medications  Medication Sig Dispense Refill   acetaminophen (TYLENOL) 500 MG tablet Take 1,000 mg by mouth every 6 (six) hours as needed for moderate pain or headache.     apixaban (ELIQUIS) 5 MG TABS tablet Take 1 tablet (5 mg total) by mouth 2 (two) times daily. 180 tablet 3   diltiazem (CARDIZEM CD) 120 MG 24 hr capsule TAKE 1 CAPSULE (120 MG TOTAL) BY MOUTH DAILY. MAY TAKE AN EXTRA TABLET DAILY FOR BREAKTHROUGH AFIB 120 capsule 1   fexofenadine (ALLEGRA) 180 MG tablet Take 180 mg by mouth daily as needed for allergies or rhinitis.     rizatriptan (MAXALT-MLT) 10 MG disintegrating tablet TAKE DAILY AS NEEDED FOR MIGRAINE MAY REPEAT IN 2 HOURS IF NEEDED X1 9 tablet 2   rosuvastatin (CRESTOR) 20 MG tablet Take 1 tablet (20 mg total) by mouth daily. 90 tablet 2   No current facility-administered medications for this encounter.    No Known Allergies  Social History   Socioeconomic History   Marital status: Married    Spouse name: Not on file   Number of children: Not on file   Years of education: Not on file   Highest education level: Not on file  Occupational History   Occupation: Attorney  Tobacco Use   Smoking status: Never    Passive exposure: Past   Smokeless tobacco: Never   Tobacco comments:    Never smoke 08/15/22  Vaping Use   Vaping Use: Never used  Substance and Sexual Activity   Alcohol use: No    Alcohol/week: 0.0 standard drinks of alcohol   Drug use: No   Sexual activity: Not on file  Other Topics Concern   Not on file  Social History Narrative   Lives in [place] with wife (wife patient at [employer]).  2 sons.- 58 Logan at [employer] and Delano at [employer] in [place]  Wife with pregnancy at 72 and 39.       Trial lawyer. Criminal defense      Hobbies: sails usually in [place]    Social Determinants of Health   Financial Resource Strain: Not on file   Food Insecurity: Not on file  Transportation Needs: Not on file  Physical Activity: Not on file  Stress: Not on file  Social Connections: Not on file  Intimate Partner Violence: Not on file     ROS- All systems are reviewed and negative except as per the HPI above.  Physical Exam: Vitals:   08/15/22 0912  BP: 136/88  Pulse: 87  Weight: 115.9 kg  Height: '6\' 1"'$  (1.854 m)     GEN- The patient is a well appearing male, alert and oriented x 3 today.   HEENT-head normocephalic, atraumatic, sclera clear, conjunctiva pink, hearing intact, trachea midline. Lungs- Clear to ausculation bilaterally, normal work of breathing Heart- Regular rate and rhythm, no murmurs,  rubs or gallops  GI- soft, NT, ND, + BS Extremities- no clubbing, cyanosis, or edema MS- no significant deformity or atrophy Skin- no rash or lesion Psych- euthymic mood, full affect Neuro- strength and sensation are intact   Wt Readings from Last 3 Encounters:  08/15/22 115.9 kg  05/16/22 112.5 kg  02/14/22 111.2 kg    EKG today demonstrates  SR Vent. rate 87 BPM PR interval 166 ms QRS duration 102 ms QT/QTcB 394/474 ms  Echo 03/28/21 demonstrated   1. Left ventricular ejection fraction, by estimation, is 65 to 70%. The left ventricle has normal function. The left ventricle has no regional wall motion abnormalities. Left ventricular diastolic function could not be evaluated.   2. Right ventricular systolic function is normal. The right ventricular  size is normal.   3. Left atrial size was severely dilated.   4. Right atrial size was moderately dilated.   5. The mitral valve is grossly normal. Trivial mitral valve  regurgitation. No evidence of mitral stenosis.   6. The aortic valve is tricuspid. Aortic valve regurgitation is mild.   7. Aortic dilatation noted. There is mild dilatation of the ascending  aorta, measuring 40 mm.   8. The inferior vena cava is normal in size with greater than 50%  respiratory  variability, suggesting right atrial pressure of 3 mmHg.   Comparison(s): Prior images unable to be directly viewed, comparison made by report only.   Conclusion(s)/Recommendation(s): Otherwise normal echocardiogram, with minor abnormalities described in the report. Mild enlargement of ascending aorta, previously noted as normal. Consider repeat imaging in 6-12 months.   Epic records are reviewed at length today  CHA2DS2-VASc Score = 1  The patient's score is based upon: CHF History: 0 HTN History: 0 Diabetes History: 0 Stroke History: 0 Vascular Disease History: 1 Age Score: 0 Gender Score: 0    ASSESSMENT AND PLAN: 1. Persistent Atrial Fibrillation (ICD10:  I48.19) The patient's CHA2DS2-VASc score is 1, indicating a 0.6% annual risk of stroke.   S/p afib ablation 06/15/21 Patient has not had any interim afib, will stop amiodarone today.  Continue diltiazem 120 mg daily  Continue Eliquis 5 mg BID. Could also consider stopping this with low CV score, will see how he does off of amio.   2. Obesity Body mass index is 33.72 kg/m. Lifestyle modification was discussed and encouraged including regular physical activity and weight reduction.  3. CAD Calcium score 947 On statin No anginal symptoms.   Follow up with Dr Johney Frame per recall. AF clinic in 6 months.    [facility] [address] [place], [place] [address] [phone] 08/15/2022 9:41 AM

## 2022-08-17 ENCOUNTER — Encounter (HOSPITAL_COMMUNITY): Payer: Self-pay | Admitting: *Deleted

## 2022-08-27 ENCOUNTER — Encounter: Payer: Self-pay | Admitting: *Deleted

## 2022-08-27 NOTE — Telephone Encounter (Signed)
This encounter was created in error - please disregard.

## 2022-11-15 ENCOUNTER — Ambulatory Visit: Payer: BC Managed Care – PPO | Attending: Cardiology

## 2022-11-15 DIAGNOSIS — I10 Essential (primary) hypertension: Secondary | ICD-10-CM

## 2022-11-15 DIAGNOSIS — Z8249 Family history of ischemic heart disease and other diseases of the circulatory system: Secondary | ICD-10-CM

## 2022-11-15 DIAGNOSIS — I480 Paroxysmal atrial fibrillation: Secondary | ICD-10-CM

## 2022-11-15 DIAGNOSIS — Z79899 Other long term (current) drug therapy: Secondary | ICD-10-CM

## 2022-11-15 DIAGNOSIS — E782 Mixed hyperlipidemia: Secondary | ICD-10-CM

## 2022-11-15 DIAGNOSIS — E785 Hyperlipidemia, unspecified: Secondary | ICD-10-CM

## 2022-11-15 DIAGNOSIS — I77819 Aortic ectasia, unspecified site: Secondary | ICD-10-CM

## 2022-11-15 DIAGNOSIS — D6869 Other thrombophilia: Secondary | ICD-10-CM

## 2022-11-15 DIAGNOSIS — I2510 Atherosclerotic heart disease of native coronary artery without angina pectoris: Secondary | ICD-10-CM

## 2022-11-16 LAB — LIPID PANEL
Chol/HDL Ratio: 3.8 ratio (ref 0.0–5.0)
Cholesterol, Total: 165 mg/dL (ref 100–199)
HDL: 43 mg/dL (ref >39)
LDL Chol Calc (NIH): 104 mg/dL — ABNORMAL HIGH (ref 0–99)
Triglycerides: 98 mg/dL (ref 0–149)
VLDL Cholesterol Cal: 18 mg/dL (ref 5–40)

## 2022-11-29 ENCOUNTER — Telehealth: Payer: Self-pay | Admitting: *Deleted

## 2022-11-29 DIAGNOSIS — Z8249 Family history of ischemic heart disease and other diseases of the circulatory system: Secondary | ICD-10-CM

## 2022-11-29 DIAGNOSIS — D6869 Other thrombophilia: Secondary | ICD-10-CM

## 2022-11-29 DIAGNOSIS — E782 Mixed hyperlipidemia: Secondary | ICD-10-CM

## 2022-11-29 DIAGNOSIS — E785 Hyperlipidemia, unspecified: Secondary | ICD-10-CM

## 2022-11-29 DIAGNOSIS — I10 Essential (primary) hypertension: Secondary | ICD-10-CM

## 2022-11-29 DIAGNOSIS — Z79899 Other long term (current) drug therapy: Secondary | ICD-10-CM

## 2022-11-29 DIAGNOSIS — I2510 Atherosclerotic heart disease of native coronary artery without angina pectoris: Secondary | ICD-10-CM

## 2022-11-29 DIAGNOSIS — I77819 Aortic ectasia, unspecified site: Secondary | ICD-10-CM

## 2022-11-29 DIAGNOSIS — Z5181 Encounter for therapeutic drug level monitoring: Secondary | ICD-10-CM

## 2022-11-29 DIAGNOSIS — I480 Paroxysmal atrial fibrillation: Secondary | ICD-10-CM

## 2022-11-29 MED ORDER — ROSUVASTATIN CALCIUM 20 MG PO TABS: 20.0000 mg | ORAL_TABLET | Freq: Every day | ORAL | 2 refills | Status: DC

## 2022-11-29 NOTE — Telephone Encounter (Signed)
-----   Message from Meriam Sprague, MD sent at 11/16/2022  1:54 PM EDT ----- Cholesterol went up. Did he stop taking the statin?

## 2022-11-29 NOTE — Telephone Encounter (Signed)
The patient has been notified of the result and verbalized understanding.  All questions (if any) were answered.  Pt states he has been terrible with remembering to take his crestor everyday.  He said he ran out of refills and never reached out to the office for more of them.  Refilled his crestor 20 mg po daily.  He is aware that this was sent in and he needs to take this daily as prescribed with no skipped doses.  Did inform him that she will want to recheck his lipids in about 3 months, to reassess his numbers.  Scheduled him for repeat lipids to be done same day as his echo on 03/14/23.  He is aware to come fasting to this lab appt.   Will make Dr. Shari Prows aware of this entire plan.   Off note: Pt did mention while I had him on the phone, if I could get a message to our afib clinic/Ricky Fenton PA-C about his episode of afib he had over last weekend.  He said last weekend he was in afib, and he decided to take his amiodarone (d/c'ed at last OV with afib clinic) for those 2 days, and he returned to sinus by the beginning of this week.   Pt states he is currently in sinus rhythm, but wanted to ask Jorja Loa PA-C and RN if they should move his appt up with them for sometime soon, to further discuss med management for his afib going forward.  Pt states afib clinic may need for him to go back on low dose amiodarone.   Pt is aware that I will forward this message to our Ricky and his RN to further review, advise, and follow-up with the pt thereafter.  Pt verbalized understanding and agrees with this plan.

## 2022-12-20 ENCOUNTER — Encounter (HOSPITAL_COMMUNITY): Payer: Self-pay | Admitting: Physician Assistant

## 2022-12-20 ENCOUNTER — Ambulatory Visit (HOSPITAL_COMMUNITY)
Admission: RE | Admit: 2022-12-20 | Discharge: 2022-12-20 | Disposition: A | Discharge status: Home / Self Care | Payer: BC Managed Care – PPO | Source: Ambulatory Visit | Attending: Physician Assistant | Admitting: Physician Assistant

## 2022-12-20 VITALS — BP 159/96 | HR 88 | Ht 73.0 in | Wt 255.2 lb

## 2022-12-20 DIAGNOSIS — R030 Elevated blood-pressure reading, without diagnosis of hypertension: Secondary | ICD-10-CM | POA: Diagnosis not present

## 2022-12-20 DIAGNOSIS — E669 Obesity, unspecified: Secondary | ICD-10-CM | POA: Insufficient documentation

## 2022-12-20 DIAGNOSIS — Z7901 Long term (current) use of anticoagulants: Secondary | ICD-10-CM | POA: Insufficient documentation

## 2022-12-20 DIAGNOSIS — I4819 Other persistent atrial fibrillation: Secondary | ICD-10-CM | POA: Diagnosis not present

## 2022-12-20 DIAGNOSIS — I2510 Atherosclerotic heart disease of native coronary artery without angina pectoris: Secondary | ICD-10-CM | POA: Insufficient documentation

## 2022-12-20 DIAGNOSIS — Z6833 Body mass index (BMI) 33.0-33.9, adult: Secondary | ICD-10-CM | POA: Insufficient documentation

## 2022-12-20 DIAGNOSIS — D6869 Other thrombophilia: Secondary | ICD-10-CM | POA: Insufficient documentation

## 2022-12-20 MED ORDER — DILTIAZEM HCL ER COATED BEADS 240 MG PO CP24: ORAL_CAPSULE | ORAL | 3 refills | Status: DC

## 2022-12-20 NOTE — Progress Notes (Signed)
Primary Care Physician: Shelva Majestic, MD Primary Cardiologist: Dr Shari Prows  Primary Electrophysiologist: Dr Johney Frame Referring Physician: [facility] Triage    Jorge Kelly is a 67 y.o. male with a history of CAD and atrial fibrillation who presents for follow up in the [facility].  The patient was initially diagnosed with atrial fibrillation in 2015. He has been maintained on flecainide. Patient is on Eliquis for a CHADS2VASC score of 2. Patient had done well with little afib until 12/2020 when he was noted to be in afib at his follow up. Patient admits he ran out of his medication. He resumed the medication and a heart monitor was placed to evaluate if he was persistent vs paroxysmal. The monitor showed 100 % afib burden. He is symptomatic with fatigue, palpitations, and dizziness. Patient underwent unsuccessful DCCV on 04/13/21. He was referred to Dr Johney Frame and underwent afib ablation on 06/15/21. He had recurrent afib and underwent DCCV on 08/01/21. He was again in afib on follow up on 08/17/21 and was loaded on amiodarone with plan for repeat DCCV but he chemically converted prior to the procedure.   On follow up today, patient mentioned on a phone call with [facility] 11/29/22 that he had an episode of afib and resumed amiodarone which did convert him to SR. He has continued to take amiodarone 100 mg daily. There were no specific triggers that he could identify. No bleeding issues on anticoagulation.   Today, he denies symptoms of palpitations, chest pain, shortness of breath, orthopnea, PND, lower extremity edema, presyncope, syncope, snoring, daytime somnolence, bleeding, or neurologic sequela. The patient is tolerating medications without difficulties and is otherwise without complaint today.    Atrial Fibrillation Risk Factors:  he does not have symptoms or diagnosis of sleep apnea. Negative sleep study 2015. he does not have a history of rheumatic fever. he  does not have a history of alcohol use. The patient does not have a history of early familial atrial fibrillation or other arrhythmias.  he has a BMI of Body mass index is 33.67 kg/m.Marland Kitchen Filed Weights   12/20/22 1509  Weight: 115.8 kg   Family History  Problem Relation Age of Onset   Lymphoma Mother    Atrial fibrillation Mother        pacemaker in 60s   Leukemia Father        passed from CLL   Cancer Sister        treated, cancer free   Diabetes type II Sister    Heart disease Maternal Uncle        mother lost 6 brothers to heart disease in 35s     Atrial Fibrillation Management history:  Previous antiarrhythmic drugs: Multaq, flecainide, amiodarone  Previous cardioversions: 2015, 2016, 08/01/21 Previous ablations: 06/15/21 Anticoagulation history: Eliquis   Past Medical History:  Diagnosis Date   Actinic keratosis 10/24/2009   ALLERGIC RHINITIS 05/11/2007   ELEVATED BLOOD PRESSURE WITHOUT DIAGNOSIS OF HYPERTENSION 11/12/2007   Dr. Rennis Golden mentioned HTN on problem list   Esophageal stricture    treated with protonix   GERD 05/11/2007   GI bleeding    20 years ago   Kidney stone    Melanoma ([facility]) 01/2014   remoted from forehead   MIGRAINE HEADACHE 05/11/2007   Overweight    Persistent atrial fibrillation ([facility]) 05/2014   Snores    Past Surgical History:  Procedure Laterality Date   ANKLE FRACTURE SURGERY  2010   x2-left  ATRIAL FIBRILLATION ABLATION N/A 06/15/2021   Procedure: ATRIAL FIBRILLATION ABLATION;  Surgeon: Hillis Range, MD;  Location: [facility];  Service: Cardiovascular;  Laterality: N/A;   CARDIOVERSION N/A 06/23/2014   CARDIOVERSION N/A 09/09/2014   CARDIOVERSION N/A 04/13/2021   Procedure: CARDIOVERSION;  Surgeon: Sande Rives, MD;  Location: [facility];  Service: Cardiovascular;  Laterality: N/A;   CARDIOVERSION N/A 08/01/2021   Procedure: CARDIOVERSION;  Surgeon: Sande Rives, MD;  Location: [facility]COPY;  Service:  Cardiovascular;  Laterality: N/A;   malignant melanoma     removed from forehead in June 2015   STRABISMUS SURGERY Right 05/21/2014   Dr. Maple Hudson   UPPER GI ENDOSCOPY     stricture, just PPI    Current Outpatient Medications  Medication Sig Dispense Refill   acetaminophen (TYLENOL) 500 MG tablet Take 1,000 mg by mouth every 6 (six) hours as needed for moderate pain or headache.     apixaban (ELIQUIS) 5 MG TABS tablet Take 1 tablet (5 mg total) by mouth 2 (two) times daily. 180 tablet 3   diltiazem (CARDIZEM CD) 120 MG 24 hr capsule TAKE 1 CAPSULE (120 MG TOTAL) BY MOUTH DAILY. MAY TAKE AN EXTRA TABLET DAILY FOR BREAKTHROUGH AFIB 120 capsule 1   fexofenadine (ALLEGRA) 180 MG tablet Take 180 mg by mouth daily as needed for allergies or rhinitis.     rizatriptan (MAXALT-MLT) 10 MG disintegrating tablet TAKE DAILY AS NEEDED FOR MIGRAINE MAY REPEAT IN 2 HOURS IF NEEDED X1 9 tablet 2   rosuvastatin (CRESTOR) 20 MG tablet Take 1 tablet (20 mg total) by mouth daily. 90 tablet 2   No current facility-administered medications for this encounter.    No Known Allergies  Social History   Socioeconomic History   Marital status: Married    Spouse name: Not on file   Number of children: Not on file   Years of education: Not on file   Highest education level: Not on file  Occupational History   Occupation: Attorney  Tobacco Use   Smoking status: Never    Passive exposure: Past   Smokeless tobacco: Never   Tobacco comments:    Never smoke 08/15/22  Vaping Use   Vaping Use: Never used  Substance and Sexual Activity   Alcohol use: No    Alcohol/week: 0.0 standard drinks of alcohol   Drug use: No   Sexual activity: Not on file  Other Topics Concern   Not on file  Social History Narrative   Lives in [place] with wife (wife patient at [employer]).  2 sons.- 16 Logan at [employer] and 21 Luke at [employer] in [place]  Wife with pregnancy at 80 and 39.       Trial lawyer. Criminal defense       Hobbies: [place]    Social Determinants of Health   Financial Resource Strain: Not on file  Food Insecurity: Not on file  Transportation Needs: Not on file  Physical Activity: Not on file  Stress: Not on file  Social Connections: Not on file  Intimate Partner Violence: Not on file     ROS- All systems are reviewed and negative except as per the HPI above.  Physical Exam: Vitals:   12/20/22 1509  BP: (!) 159/96  Pulse: 88  Weight: 115.8 kg  Height: 6\' 1"  (1.854 m)    GEN- The patient is a well appearing male, alert and oriented x 3 today.   HEENT-head normocephalic, atraumatic, sclera  clear, conjunctiva pink, hearing intact, trachea midline. Lungs- Clear to ausculation bilaterally, normal work of breathing Heart- Regular rate and rhythm, no murmurs, rubs or gallops  GI- soft, NT, ND, + BS Extremities- no clubbing, cyanosis, or edema MS- no significant deformity or atrophy Skin- no rash or lesion Psych- euthymic mood, full affect Neuro- strength and sensation are intact   Wt Readings from Last 3 Encounters:  12/20/22 115.8 kg  08/15/22 115.9 kg  05/16/22 112.5 kg    EKG today demonstrates  SR Vent. rate 88 BPM PR interval 176 ms QRS duration 100 ms QT/QTcB 394/476 ms  Echo 03/28/21 demonstrated   1. Left ventricular ejection fraction, by estimation, is 65 to 70%. The left ventricle has normal function. The left ventricle has no regional wall motion abnormalities. Left ventricular diastolic function could not be evaluated.   2. Right ventricular systolic function is normal. The right ventricular  size is normal.   3. Left atrial size was severely dilated.   4. Right atrial size was moderately dilated.   5. The mitral valve is grossly normal. Trivial mitral valve  regurgitation. No evidence of mitral stenosis.   6. The aortic valve is tricuspid. Aortic valve regurgitation is mild.   7. Aortic dilatation noted. There is mild dilatation  of the ascending  aorta, measuring 40 mm.   8. The inferior vena cava is normal in size with greater than 50%  respiratory variability, suggesting right atrial pressure of 3 mmHg.   Comparison(s): Prior images unable to be directly viewed, comparison made by report only.   Conclusion(s)/Recommendation(s): Otherwise normal echocardiogram, with minor abnormalities described in the report. Mild enlargement of ascending aorta, previously noted as normal. Consider repeat imaging in 6-12 months.   Epic records are reviewed at length today  CHA2DS2-VASc Score = 2  The patient's score is based upon: CHF History: 0 HTN History: 0 Diabetes History: 0 Stroke History: 0 Vascular Disease History: 1 Age Score: 1 Gender Score: 0       ASSESSMENT AND PLAN: 1. Persistent Atrial Fibrillation (ICD10:  I48.19) The patient's CHA2DS2-VASc score is 2, indicating a 2.2% annual risk of stroke.   S/p afib ablation 06/15/21 We discussed rhythm control options today. Would like to avoid long term exposure to amiodarone with possible off target effects. Will stop amiodarone today. Increase diltiazem to 240 mg daily Can consider dofetilide vs repeat ablation if he has frequent recurrence. Patient plans to discuss with his wife.  Continue Eliquis 5 mg BID  2. Secondary Hypercoagulable State (ICD10:  D68.69) The patient is at significant risk for stroke/thromboembolism based upon his CHA2DS2-VASc Score of 2.  Continue Apixaban (Eliquis).   3. Obesity Body mass index is 33.67 kg/m. Lifestyle modification was discussed and encouraged including regular physical activity and weight reduction.  4. CAD Calcium score 947 On statin No anginal symptoms.  5. Elevated BP Patient denies formal diagnosis of HTN Increase CCB as above.    Follow up in the AF clinic in July as scheduled to revisit rhythm control.    Jorja Loa PA-C [facility] [address] [place], [place]  [address] [phone] 12/20/2022 3:34 PM

## 2022-12-20 NOTE — Patient Instructions (Signed)
Stop Amiodarone Start Diltiazem 240mg - Taking 1 capsule by mouth daily

## 2023-02-05 ENCOUNTER — Ambulatory Visit: Payer: BC Managed Care – PPO | Admitting: Family Medicine

## 2023-02-13 ENCOUNTER — Ambulatory Visit (HOSPITAL_COMMUNITY)
Admission: RE | Admit: 2023-02-13 | Discharge: 2023-02-13 | Disposition: A | Discharge status: Home / Self Care | Payer: BC Managed Care – PPO | Source: Ambulatory Visit | Attending: Physician Assistant | Admitting: Physician Assistant

## 2023-02-13 ENCOUNTER — Encounter (HOSPITAL_COMMUNITY): Payer: Self-pay | Admitting: Physician Assistant

## 2023-02-13 VITALS — BP 118/74 | HR 92 | Ht 73.0 in | Wt 252.4 lb

## 2023-02-13 DIAGNOSIS — I4819 Other persistent atrial fibrillation: Secondary | ICD-10-CM | POA: Insufficient documentation

## 2023-02-13 DIAGNOSIS — D6869 Other thrombophilia: Secondary | ICD-10-CM | POA: Diagnosis not present

## 2023-02-13 DIAGNOSIS — E669 Obesity, unspecified: Secondary | ICD-10-CM | POA: Diagnosis not present

## 2023-02-13 DIAGNOSIS — I10 Essential (primary) hypertension: Secondary | ICD-10-CM | POA: Insufficient documentation

## 2023-02-13 DIAGNOSIS — Z6833 Body mass index (BMI) 33.0-33.9, adult: Secondary | ICD-10-CM | POA: Diagnosis not present

## 2023-02-13 DIAGNOSIS — Z7901 Long term (current) use of anticoagulants: Secondary | ICD-10-CM | POA: Insufficient documentation

## 2023-02-13 DIAGNOSIS — I2510 Atherosclerotic heart disease of native coronary artery without angina pectoris: Secondary | ICD-10-CM | POA: Insufficient documentation

## 2023-02-13 NOTE — Progress Notes (Signed)
Primary Care Physician: Shelva Majestic, MD Primary Cardiologist: Dr Shari Prows  Primary Electrophysiologist: Dr Johney Frame Referring Physician: [facility]    Jorge Kelly is a 67 y.o. male with a history of CAD and atrial fibrillation who presents for follow up in the [facility].  The patient was initially diagnosed with atrial fibrillation in 2015. He has been maintained on flecainide. Patient is on Eliquis for a CHADS2VASC score of 2. Patient had done well with little afib until 12/2020 when he was noted to be in afib at his follow up. Patient admits he ran out of his medication. He resumed the medication and a heart monitor was placed to evaluate if he was persistent vs paroxysmal. The monitor showed 100 % afib burden. He is symptomatic with fatigue, palpitations, and dizziness. Patient underwent unsuccessful DCCV on 04/13/21. He was referred to Dr Johney Frame and underwent afib ablation on 06/15/21. He had recurrent afib and underwent DCCV on 08/01/21. He was again in afib on follow up on 08/17/21 and was loaded on amiodarone with plan for repeat DCCV but he chemically converted prior to the procedure. Amiodarone eventually discontinued.   On follow up today, patient reports that increasing diltiazem initially helped with his afib episodes but around the 4th of July he had two episodes, one lasting for 4 days. He is back in SR today. There were no specific triggers that he could identify.   Today, he denies symptoms of palpitations, chest pain, shortness of breath, orthopnea, PND, lower extremity edema, presyncope, syncope, snoring, daytime somnolence, bleeding, or neurologic sequela. The patient is tolerating medications without difficulties and is otherwise without complaint today.    Atrial Fibrillation Risk Factors:  he does not have symptoms or diagnosis of sleep apnea. Negative sleep study 2015. he does not have a history of rheumatic fever. he does not have a  history of alcohol use. The patient does not have a history of early familial atrial fibrillation or other arrhythmias.   Atrial Fibrillation Management history:  Previous antiarrhythmic drugs: Multaq, flecainide, amiodarone  Previous cardioversions: 2015, 2016, 08/01/21 Previous ablations: 06/15/21 Anticoagulation history: Eliquis   Past Medical History:  Diagnosis Date   Actinic keratosis 10/24/2009   ALLERGIC RHINITIS 05/11/2007   ELEVATED BLOOD PRESSURE WITHOUT DIAGNOSIS OF HYPERTENSION 11/12/2007   Dr. Rennis Golden mentioned HTN on problem list   Esophageal stricture    treated with protonix   GERD 05/11/2007   GI bleeding    20 years ago   Kidney stone    Melanoma (HCC) 01/2014   remoted from forehead   MIGRAINE HEADACHE 05/11/2007   Overweight    Persistent atrial fibrillation (HCC) 05/2014   Snores    ROS- All systems are reviewed and negative except as per the HPI above.  Physical Exam: Vitals:   02/13/23 0920  BP: 118/74  Pulse: 92  Weight: 114.5 kg  Height: 6\' 1"  (1.854 m)     GEN: Well nourished, well developed in no acute distress NECK: No JVD; No carotid bruits CARDIAC: Regular rate and rhythm, no murmurs, rubs, gallops RESPIRATORY:  Clear to auscultation without rales, wheezing or rhonchi  ABDOMEN: Soft, non-tender, non-distended EXTREMITIES:  No edema; No deformity    Wt Readings from Last 3 Encounters:  02/13/23 114.5 kg  12/20/22 115.8 kg  08/15/22 115.9 kg    EKG today demonstrates  SR, PVC Vent. rate 92 BPM PR interval 172 ms QRS duration 102 ms QT/QTcB 384/474 ms  Echo 03/28/21  demonstrated   1. Left ventricular ejection fraction, by estimation, is 65 to 70%. The left ventricle has normal function. The left ventricle has no regional wall motion abnormalities. Left ventricular diastolic function could not be evaluated.   2. Right ventricular systolic function is normal. The right ventricular  size is normal.   3. Left atrial size was  severely dilated.   4. Right atrial size was moderately dilated.   5. The mitral valve is grossly normal. Trivial mitral valve  regurgitation. No evidence of mitral stenosis.   6. The aortic valve is tricuspid. Aortic valve regurgitation is mild.   7. Aortic dilatation noted. There is mild dilatation of the ascending  aorta, measuring 40 mm.   8. The inferior vena cava is normal in size with greater than 50%  respiratory variability, suggesting right atrial pressure of 3 mmHg.   Comparison(s): Prior images unable to be directly viewed, comparison made by report only.   Conclusion(s)/Recommendation(s): Otherwise normal echocardiogram, with minor abnormalities described in the report. Mild enlargement of ascending aorta, previously noted as normal. Consider repeat imaging in 6-12 months.   Epic records are reviewed at length today  CHA2DS2-VASc Score = 2  The patient's score is based upon: CHF History: 0 HTN History: 0 Diabetes History: 0 Stroke History: 0 Vascular Disease History: 1 Age Score: 1 Gender Score: 0       ASSESSMENT AND PLAN: 1. Persistent Atrial Fibrillation (ICD10:  I48.19) The patient's CHA2DS2-VASc score is 2, indicating a 2.2% annual risk of stroke.   S/p afib ablation 06/15/21 Patient still having frequent symptomatic episodes of afib. We discussed rhythm control options today. Patient agreeable to consultation with EP to discuss repeat ablation. Could consider dofetilide but his QT may be too long at baseline. Would avoid class IC with h/o CAD. Continue diltiazem 240 mg daily Continue Eliquis 5 mg BID  Secondary Hypercoagulable State (ICD10:  D68.69) The patient is at significant risk for stroke/thromboembolism based upon his CHA2DS2-VASc Score of 2.  Continue Apixaban (Eliquis).   Obesity Body mass index is 33.3 kg/m.  Encouraged lifestyle modification  CAD Calcium score 947 On statin No anginal symptoms    Follow up with EP to establish care and  discuss repeat ablation (former [facility] pt).    Jorja Loa PA-C [facility] [address] [place], [place] [address] [phone] 02/13/2023 9:40 AM

## 2023-03-06 ENCOUNTER — Encounter (INDEPENDENT_AMBULATORY_CARE_PROVIDER_SITE_OTHER): Payer: Self-pay

## 2023-03-14 ENCOUNTER — Ambulatory Visit (HOSPITAL_COMMUNITY): Payer: BC Managed Care – PPO | Attending: Internal Medicine

## 2023-03-14 ENCOUNTER — Ambulatory Visit: Payer: BC Managed Care – PPO

## 2023-03-14 DIAGNOSIS — I2584 Coronary atherosclerosis due to calcified coronary lesion: Secondary | ICD-10-CM | POA: Insufficient documentation

## 2023-03-14 DIAGNOSIS — Z79899 Other long term (current) drug therapy: Secondary | ICD-10-CM | POA: Diagnosis present

## 2023-03-14 DIAGNOSIS — E782 Mixed hyperlipidemia: Secondary | ICD-10-CM

## 2023-03-14 DIAGNOSIS — I340 Nonrheumatic mitral (valve) insufficiency: Secondary | ICD-10-CM | POA: Diagnosis not present

## 2023-03-14 DIAGNOSIS — I2510 Atherosclerotic heart disease of native coronary artery without angina pectoris: Secondary | ICD-10-CM | POA: Insufficient documentation

## 2023-03-14 DIAGNOSIS — Z5181 Encounter for therapeutic drug level monitoring: Secondary | ICD-10-CM | POA: Diagnosis present

## 2023-03-14 DIAGNOSIS — I77819 Aortic ectasia, unspecified site: Secondary | ICD-10-CM | POA: Diagnosis not present

## 2023-03-14 DIAGNOSIS — Z8249 Family history of ischemic heart disease and other diseases of the circulatory system: Secondary | ICD-10-CM | POA: Diagnosis present

## 2023-03-14 DIAGNOSIS — I480 Paroxysmal atrial fibrillation: Secondary | ICD-10-CM | POA: Insufficient documentation

## 2023-03-14 DIAGNOSIS — E785 Hyperlipidemia, unspecified: Secondary | ICD-10-CM | POA: Diagnosis present

## 2023-03-14 DIAGNOSIS — I10 Essential (primary) hypertension: Secondary | ICD-10-CM

## 2023-03-14 DIAGNOSIS — D6869 Other thrombophilia: Secondary | ICD-10-CM | POA: Diagnosis present

## 2023-03-14 LAB — ECHOCARDIOGRAM COMPLETE
Est EF: 55
P 1/2 time: 650 msec
S' Lateral: 3.1 cm

## 2023-03-14 LAB — LIPID PANEL
Chol/HDL Ratio: 2.6 ratio (ref 0.0–5.0)
Cholesterol, Total: 100 mg/dL (ref 100–199)
HDL: 38 mg/dL — ABNORMAL LOW (ref >39)
LDL Chol Calc (NIH): 42 mg/dL (ref 0–99)
Triglycerides: 109 mg/dL (ref 0–149)
VLDL Cholesterol Cal: 20 mg/dL (ref 5–40)

## 2023-04-04 ENCOUNTER — Encounter: Payer: Self-pay | Admitting: Family Medicine

## 2023-04-04 ENCOUNTER — Ambulatory Visit (INDEPENDENT_AMBULATORY_CARE_PROVIDER_SITE_OTHER): Payer: BC Managed Care – PPO | Admitting: Family Medicine

## 2023-04-04 VITALS — BP 120/86 | HR 88 | Temp 97.7°F | Ht 73.0 in | Wt 249.8 lb

## 2023-04-04 DIAGNOSIS — I480 Paroxysmal atrial fibrillation: Secondary | ICD-10-CM

## 2023-04-04 DIAGNOSIS — E669 Obesity, unspecified: Secondary | ICD-10-CM

## 2023-04-04 DIAGNOSIS — I77819 Aortic ectasia, unspecified site: Secondary | ICD-10-CM

## 2023-04-04 DIAGNOSIS — Z23 Encounter for immunization: Secondary | ICD-10-CM | POA: Diagnosis not present

## 2023-04-04 DIAGNOSIS — E782 Mixed hyperlipidemia: Secondary | ICD-10-CM | POA: Diagnosis not present

## 2023-04-04 DIAGNOSIS — R7989 Other specified abnormal findings of blood chemistry: Secondary | ICD-10-CM

## 2023-04-04 DIAGNOSIS — Z131 Encounter for screening for diabetes mellitus: Secondary | ICD-10-CM | POA: Diagnosis not present

## 2023-04-04 DIAGNOSIS — Z1211 Encounter for screening for malignant neoplasm of colon: Secondary | ICD-10-CM

## 2023-04-04 LAB — CBC WITH DIFFERENTIAL/PLATELET
Basophils Absolute: 0 10*3/uL (ref 0.0–0.1)
Basophils Relative: 0.5 % (ref 0.0–3.0)
Eosinophils Absolute: 0.2 10*3/uL (ref 0.0–0.7)
Eosinophils Relative: 2.8 % (ref 0.0–5.0)
HCT: 47.4 % (ref 39.0–52.0)
Hemoglobin: 15.8 g/dL (ref 13.0–17.0)
Lymphocytes Relative: 25.5 % (ref 12.0–46.0)
Lymphs Abs: 1.8 10*3/uL (ref 0.7–4.0)
MCHC: 33.3 g/dL (ref 30.0–36.0)
MCV: 93.2 fl (ref 78.0–100.0)
Monocytes Absolute: 0.5 10*3/uL (ref 0.1–1.0)
Monocytes Relative: 7.6 % (ref 3.0–12.0)
Neutro Abs: 4.5 10*3/uL (ref 1.4–7.7)
Neutrophils Relative %: 63.6 % (ref 43.0–77.0)
Platelets: 150 10*3/uL (ref 150.0–400.0)
RBC: 5.08 Mil/uL (ref 4.22–5.81)
RDW: 13.3 % (ref 11.5–15.5)
WBC: 7 10*3/uL (ref 4.0–10.5)

## 2023-04-04 LAB — COMPREHENSIVE METABOLIC PANEL
ALT: 12 U/L (ref 0–53)
AST: 12 U/L (ref 0–37)
Albumin: 4 g/dL (ref 3.5–5.2)
Alkaline Phosphatase: 69 U/L (ref 39–117)
BUN: 20 mg/dL (ref 6–23)
CO2: 26 mEq/L (ref 19–32)
Calcium: 9.2 mg/dL (ref 8.4–10.5)
Chloride: 110 mEq/L (ref 96–112)
Creatinine, Ser: 0.96 mg/dL (ref 0.40–1.50)
GFR: 82.77 mL/min (ref >60.00)
Glucose, Bld: 83 mg/dL (ref 70–99)
Potassium: 4.1 mEq/L (ref 3.5–5.1)
Sodium: 140 mEq/L (ref 135–145)
Total Bilirubin: 0.9 mg/dL (ref 0.2–1.2)
Total Protein: 6.3 g/dL (ref 6.0–8.3)

## 2023-04-04 LAB — HEMOGLOBIN A1C: Hgb A1c MFr Bld: 5.3 % (ref 4.6–6.5)

## 2023-04-04 LAB — T3, FREE: T3, Free: 3 pg/mL (ref 2.3–4.2)

## 2023-04-04 LAB — TSH: TSH: 2.98 u[IU]/mL (ref 0.35–5.50)

## 2023-04-04 LAB — T4, FREE: Free T4: 1.07 ng/dL (ref 0.60–1.60)

## 2023-04-04 NOTE — Progress Notes (Signed)
Phone [phone] In person visit   Subjective:   Jorge Kelly is a 67 y.o. year old very pleasant male patient who presents for/with See problem oriented charting Chief Complaint  Patient presents with   Weight Loss    Pt c/o not losing weight and having difficulty losing weight. States his diet is not well and he does walk daily.   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     apixaban (ELIQUIS) 5 MG TABS tablet Take 1 tablet (5 mg total) by mouth 2 (two) times daily. 180 tablet 3   diltiazem (CARDIZEM CD) 240 MG 24 hr capsule Take 1 capsule by mouth daily 30 capsule 3   fexofenadine (ALLEGRA) 180 MG tablet Take 180 mg by mouth daily as needed for allergies or rhinitis.     rosuvastatin (CRESTOR) 20 MG tablet Take 1 tablet (20 mg total) by mouth daily. 90 tablet 2   rizatriptan (MAXALT-MLT) 10 MG disintegrating tablet TAKE DAILY AS NEEDED FOR MIGRAINE MAY REPEAT IN 2 HOURS IF NEEDED X1 (Patient not taking: Reported on 04/04/2023) 9 tablet 2   No current  facility-administered medications for this visit.     Objective:  BP 120/86   Pulse 88   Temp 97.7 F (36.5 C)   Ht 6\' 1"  (1.854 m)   Wt 249 lb 12.8 oz (113.3 kg)   SpO2 94%   BMI 32.96 kg/m  Gen: NAD, resting comfortably CV: Irregularly irregular-no murmurs rubs or gallops Lungs: CTAB no crackles, wheeze, rhonchi Ext: no edema Skin: warm, dry     Assessment and Plan   #HM- due for cologuard- sent in today.  # Obesity S: patient reports difficulty with losing weight  - last 2 years has gained about 50 lbs (calorie counting before) and felt like had less a fib when weight was lower- at least less frequent - having trouble with appetite being high -exercise slightly less and remains active. Walking 10 minutes a day for 4 days a week after work.  -prior 7 years of weight loss with weight loss  -does have elevated TSH but under 10 and T3 and t4 have been ok Wt Readings from Last 3 Encounters:  04/04/23 249 lb 12.8 oz (113.3 kg)  02/13/23 252 lb 6.4 oz (114.5 kg)  12/20/22 255 lb 3.2 oz (115.8 kg)   A/P: Patient is very discouraged by his 50 pound weight gain over the last 2 years after previously maintaining 50 pound weight loss over 7  years. - Calorie counting home from the past and I strongly encouraged him to restart this and gave some guidance - I also want a make sure his elevated TSH has not worsened/has not progressed past subclinical hypothyroidism - Also discussed healthy weight wellness program - He is very strongly interested in [place] and wants to discuss this with his wife as well as the above options-we will check back and after labs come back  Also for healthy lifestyle (though not primarily for weight loss): -lets try to get your walking up to 30 minutes gradually for 5 days a week (150 minute goal) and try to add in weight training perhaps 2 days a week.   # Atrial fibrillation-cardiologist Dr. Delton See #Chronic anticoagulation S: Antiarrhythmic- none at  this time (prior flecainide  100 mg twice daily)  Rate controlled with diltiazem 240 mg extended release.   Anticoagulated with Eliquis 5 mg twice daily -ablation 06/15/21- but returned to a fib - he is considering another ablation has visit with Dr. Elberta Fortis upcoming  Last visit with cardiology:02/13/23 a fib clinic A/P: Patient has been having difficulty with atrial fibrillation-feels very fatigued with this at times-upcoming visit to discuss possible ablation and he also wants to ask about the possibility of getting a second opinion-I discussed with him that Dr. Elberta Fortis would be very open to discussing this and is an excellent doctor to take care of him if he chooses to keep his care local  #hyperlipidemia-LDL goal under 70-under 55 even more ideal S: Medication: Rosuvastatin 20 mg daily  Lab Results  Component Value Date   CHOL 100 03/14/2023   HDL 38 (L) 03/14/2023   LDLCALC 42 03/14/2023   LDLDIRECT 96.1 09/02/2013   TRIG 109 03/14/2023   CHOLHDL 2.6 03/14/2023  A/P: Cholesterols very well-controlled-continue current medication   #Thrombocytopenia-patient with mild intermittent thrombocytopenia.  Rest of cell lines are normal.  Continue to monitor at least yearly including updating with labs today Lab Results  Component Value Date   WBC 5.5 08/24/2021   HGB 15.3 08/24/2021   HCT 42.8 08/24/2021   MCV 88 08/24/2021   PLT 171 08/24/2021   # Aortic dilation-noted on prior echocardiograms but not noted on most recent echocardiogram--plan was for him to discuss this with Dr. Shari Prows but she has retired-we will see if Dr. Elberta Fortis lays in but I am very reassured by recent echocardiogram  Recommended follow up: Return in about 6 months (around 10/04/2023) for physical or sooner if needed.Schedule b4 you leave. Future Appointments  Date Time Provider Department Center  04/29/2023 11:15 AM Regan Lemming, MD [id number]    Lab/Order associations:   ICD-10-CM   1.  PAF (paroxysmal atrial fibrillation) (HCC)  I48.0     2. Obesity (BMI 30-39.9)  E66.9 HgB A1c    3. Mixed hyperlipidemia  E78.2 Comprehensive metabolic panel    CBC with Differential/Platelet    4. Encounter for immunization  Z23 Pneumococcal conjugate vaccine 20-valent    5. Screening for diabetes mellitus  Z13.1 HgB A1c    6. Elevated TSH  R79.89 TSH    T4, free    T3, free    7. Screen for colon cancer  Z12.11 Cologuard    8. Aortic dilatation (HCC)  I77.819      No orders of the defined types were placed in this encounter.  Return precautions advised.  Tana Conch, MD

## 2023-04-04 NOTE — Patient Instructions (Addendum)
Let us know when you get your flu and COVID vaccine a the pharmacy.  Please stop by lab before you go If you have mychart- we will send your results within 3 business days of [place] receiving them.  If you do not have mychart- we will call you about results within 5 business days of [place] receiving them.  *please also note that you will see labs on mychart as soon as they post. I will later go in and write notes on them- will say "notes from Dr. Durene Cal"   -lets try to get your walking up to 30 minutes gradually for 5 days a week (150 minute goal) and try to add in weight training perhaps 2 days a week.   We are strongly considering Wegovy if thyroid tests are normal  Another option would be healthy weight to wellness medically supervised weight loss program with [facility] also has a similar program.   I suggest myfitnesspal Use 0.5 pounds per week weight loss goal Set a reasonable goal such as 5-10 lbs and can reset goal once you reach out Do not connect your step counter to this- watch or phone Update me in 2-3 months with how you are doing  please let us know if you have not received your Cologuard within 3 weeks-please complete after you receive  Recommended follow up: Return in about 6 months (around 10/04/2023) for physical or sooner if needed.Schedule b4 you leave.

## 2023-04-05 ENCOUNTER — Encounter: Payer: Self-pay | Admitting: Cardiology

## 2023-04-05 ENCOUNTER — Telehealth: Payer: Self-pay | Admitting: Cardiology

## 2023-04-05 ENCOUNTER — Other Ambulatory Visit: Payer: Self-pay

## 2023-04-05 DIAGNOSIS — I480 Paroxysmal atrial fibrillation: Secondary | ICD-10-CM

## 2023-04-05 DIAGNOSIS — Z79899 Other long term (current) drug therapy: Secondary | ICD-10-CM

## 2023-04-05 MED ORDER — APIXABAN 5 MG PO TABS: 5.0000 mg | ORAL_TABLET | Freq: Two times a day (BID) | ORAL | 3 refills | Status: DC

## 2023-04-05 NOTE — Telephone Encounter (Signed)
Prescription refill request for Eliquis received. Indication:afib Last office visit:7/24 Scr:0.96 Age: 67 Weight:113.3  kg  Prescription refilled

## 2023-04-05 NOTE — Telephone Encounter (Signed)
*  STAT* If patient is at the pharmacy, call can be transferred to refill team.   1. Which medications need to be refilled? (please list name of each medication and dose if known)   apixaban (ELIQUIS) 5 MG TABS tablet    2. Which pharmacy/location (including street and city if local pharmacy) is medication to be sent to? [facility] [phone] [facility] - , [place] - [address]    3. Do they need a 30 day or 90 day supply? 90 day

## 2023-04-05 NOTE — Telephone Encounter (Signed)
Please refer to Eliquis refill encounter from today by another user. Will not resend as this has been taken care of already.

## 2023-04-23 ENCOUNTER — Ambulatory Visit: Payer: BC Managed Care – PPO | Admitting: Cardiology

## 2023-04-29 ENCOUNTER — Ambulatory Visit: Payer: BC Managed Care – PPO | Attending: Cardiology | Admitting: Cardiology

## 2023-04-29 ENCOUNTER — Encounter: Payer: Self-pay | Admitting: Cardiology

## 2023-04-29 ENCOUNTER — Encounter: Payer: Self-pay | Admitting: *Deleted

## 2023-04-29 VITALS — BP 124/80 | HR 97 | Ht 73.0 in | Wt 250.6 lb

## 2023-04-29 DIAGNOSIS — I2510 Atherosclerotic heart disease of native coronary artery without angina pectoris: Secondary | ICD-10-CM

## 2023-04-29 DIAGNOSIS — I4819 Other persistent atrial fibrillation: Secondary | ICD-10-CM

## 2023-04-29 DIAGNOSIS — D6869 Other thrombophilia: Secondary | ICD-10-CM | POA: Diagnosis not present

## 2023-04-29 MED ORDER — AMIODARONE HCL 200 MG PO TABS: ORAL_TABLET | ORAL | 3 refills | Status: DC

## 2023-04-29 NOTE — Patient Instructions (Addendum)
Medication Instructions:  Your physician has recommended you make the following change in your medication:  START Amiodarone  - take 2 tablets (400 mg total) TWICE a day for 2 weeks, then  - take 1 tablet (200 mg total) TWICE a day for 2 weeks, then  - take 1 tablet (200 mg total) ONCE a day  *If you need a refill on your cardiac medications before your next appointment, please call your pharmacy*   Lab Work: Pre procedure labs -- see procedure instruction letter:  BMP & CBC  If you have any lab test that is abnormal and we need to change your treatment, we will call you to review the results -- otherwise no news is good news.    Testing/Procedures: Your physician has recommended that you have a Cardioversion (DCCV). Electrical Cardioversion uses a jolt of electricity to your heart either through paddles or wired patches attached to your chest. This is a controlled, usually prescheduled, procedure. Defibrillation is done under light anesthesia in the hospital, and you usually go home the day of the procedure. This is done to get your heart back into a normal rhythm. You are not awake for the procedure. Please see the instructions below under "other instructions".   Your physician has requested that you have cardiac CT within 7 days PRIOR to your ablation. Cardiac computed tomography (CT) is a painless test that uses an x-ray machine to take clear, detailed pictures of your heart.  Please follow instruction below located under "other instructions". You will get a call from our office to schedule the date for this test.  Your physician has recommended that you have an ablation. Catheter ablation is a medical procedure used to treat some cardiac arrhythmias (irregular heartbeats). During catheter ablation, a long, thin, flexible tube is put into a blood vessel in your groin (upper thigh), or neck. This tube is called an ablation catheter. It is then guided to your heart through the blood vessel.  Radio frequency waves destroy small areas of heart tissue where abnormal heartbeats may cause an arrhythmia to start.   You will be scheduled for 08/20/2022.  The EP scheduler, April, will be in touch with CT & procedure instructions.   Follow-Up: At [facility], you and your health needs are our priority.  As part of our continuing mission to provide you with exceptional heart care, we have created designated Provider Care Teams.  These Care Teams include your primary Cardiologist (physician) and Advanced Practice Providers (APPs -  Physician Assistants and Nurse Practitioners) who all work together to provide you with the care you need, when you need it.  We recommend signing up for the patient portal called "MyChart".  Sign up information is provided on this After Visit Summary.  MyChart is used to connect with patients for Virtual Visits (Telemedicine).  Patients are able to view lab/test results, encounter notes, upcoming appointments, etc.  Non-urgent messages can be sent to your provider as well.   To learn more about what you can do with MyChart, go to [email or web address].     Your physician recommends that you schedule a follow-up appointment in: 2 months with the afib clinic.  Your next appointment:   1 month(s) after your ablation  The format for your next appointment:   In Person  Provider:   AFib clinic   Thank you for choosing [facility]!!   Dory Horn, RN [phone]    Other Instructions      Dear Janeece Riggers  Ariam Lamper are scheduled for a Cardioversion on Friday, October 18 with Dr. Scharlene Gloss.  Please arrive at the [facility] (Main Entrance A) at [facility]: [address] [place], [place] [address] at 8:00 AM (This time is 1 hour(s) before your procedure to ensure your preparation). Free valet parking service is available. You will check in at ADMITTING. The support person will be asked to wait in the waiting room.  It is OK to have someone drop  you off and come back when you are ready to be discharged.      DIET:  Nothing to eat or drink after midnight except a sip of water with medications (see medication instructions below)  MEDICATION INSTRUCTIONS: !!IF ANY NEW MEDICATIONS ARE STARTED AFTER TODAY, PLEASE NOTIFY YOUR PROVIDER AS SOON AS POSSIBLE!!  FYI: Medications such as Semaglutide (Ozempic, [place]), Tirzepatide (Mounjaro, Zepbound), Dulaglutide (Trulicity), etc ("GLP1 agonists") AND Canagliflozin (Invokana), Dapagliflozin (Farxiga), Empagliflozin (Jardiance), Ertugliflozin (Steglatro), Bexagliflozin [employer]) or any combination with one of these drugs such as Invokamet (Canagliflozin/Metformin), Synjardy (Empagliflozin/Metformin), etc ("SGLT2 inhibitors") must be held around the time of a procedure. This is not a comprehensive list of all of these drugs. Please review all of your medications and talk to your provider if you take any one of these. If you are not sure, ask your provider.   HOLD your morning medications -- TAKE ONLY YOUR ELIQUIS   Continue taking your anticoagulant (blood thinner): Apixaban (Eliquis).  You will need to continue this after your procedure until you are told by your provider that it is safe to stop.    LABS:  in the hospital   FYI:  For your safety, and to allow [place] to monitor your vital signs accurately during the surgery/procedure we request: If you have artificial nails, gel coating, SNS etc, please have those removed prior to your surgery/procedure. Not having the nail coverings /polish removed may result in cancellation or delay of your surgery/procedure.  You must have a responsible person to drive you home and stay in the waiting area during your procedure. Failure to do so could result in cancellation.  Bring your insurance cards.  *Special Note: Every effort is made to have your procedure done on time. Occasionally there are emergencies that occur at the hospital that may cause delays.  Please be patient if a delay does occur.        Cardiac Ablation Cardiac ablation is a procedure to destroy (ablate) some heart tissue that is sending bad signals. These bad signals cause problems in heart rhythm. The heart has many areas that make these signals. If there are problems in these areas, they can make the heart beat in a way that is not normal. Destroying some tissues can help make the heart rhythm normal. Tell your doctor about: Any allergies you have. All medicines you are taking. These include vitamins, herbs, eye drops, creams, and over-the-counter medicines. Any problems you or family members have had with medicines that make you fall asleep (anesthetics). Any blood disorders you have. Any surgeries you have had. Any medical conditions you have, such as kidney failure. Whether you are pregnant or may be pregnant. What are the risks? This is a safe procedure. But problems may occur, including: Infection. Bruising and bleeding. Bleeding into the chest. Stroke or blood clots. Damage to nearby areas of your body. Allergies to medicines or dyes. The need for a pacemaker if the normal system is damaged. Failure of the procedure to treat the  problem. What happens before the procedure? Medicines Ask your doctor about: Changing or stopping your normal medicines. This is important. Taking aspirin and ibuprofen. Do not take these medicines unless your doctor tells you to take them. Taking other medicines, vitamins, herbs, and supplements. General instructions Follow instructions from your doctor about what you cannot eat or drink. Plan to have someone take you home from the hospital or clinic. If you will be going home right after the procedure, plan to have someone with you for 24 hours. Ask your doctor what steps will be taken to prevent infection. What happens during the procedure?  An IV tube will be put into one of your veins. You will be given a medicine to help  you relax. The skin on your neck or groin will be numbed. A cut (incision) will be made in your neck or groin. A needle will be put through your cut and into a large vein. A tube (catheter) will be put into the needle. The tube will be moved to your heart. Dye may be put through the tube. This helps your doctor see your heart. Small devices (electrodes) on the tube will send out signals. A type of energy will be used to destroy some heart tissue. The tube will be taken out. Pressure will be held on your cut. This helps stop bleeding. A bandage will be put over your cut. The exact procedure may vary among doctors and hospitals. What happens after the procedure? You will be watched until you leave the hospital or clinic. This includes checking your heart rate, breathing rate, oxygen, and blood pressure. Your cut will be watched for bleeding. You will need to lie still for a few hours. Do not drive for 24 hours or as long as your doctor tells you. Summary Cardiac ablation is a procedure to destroy some heart tissue. This is done to treat heart rhythm problems. Tell your doctor about any medical conditions you may have. Tell him or her about all medicines you are taking to treat them. This is a safe procedure. But problems may occur. These include infection, bruising, bleeding, and damage to nearby areas of your body. Follow what your doctor tells you about food and drink. You may also be told to change or stop some of your medicines. After the procedure, do not drive for 24 hours or as long as your doctor tells you. This information is not intended to replace advice given to you by your health care provider. Make sure you discuss any questions you have with your health care provider. Document Revised: 10/13/2021 Document Reviewed: 06/25/2019 Elsevier Patient Education  2023 Elsevier Inc.   Cardiac Ablation, Care After  This sheet gives you information about how to care for yourself after  your procedure. Your health care provider may also give you more specific instructions. If you have problems or questions, contact your health care provider. What can I expect after the procedure? After the procedure, it is common to have: Bruising around your puncture site. Tenderness around your puncture site. Skipped heartbeats. If you had an atrial fibrillation ablation, you may have atrial fibrillation during the first several months after your procedure.  Tiredness (fatigue).  Follow these instructions at home: Puncture site care  Follow instructions from your health care provider about how to take care of your puncture site. Make sure you: If present, leave stitches (sutures), skin glue, or adhesive strips in place. These skin closures may need to stay in place for up  to 2 weeks. If adhesive strip edges start to loosen and curl up, you may trim the loose edges. Do not remove adhesive strips completely unless your health care provider tells you to do that. If a large square bandage is present, this may be removed 24 hours after surgery.  Check your puncture site every day for signs of infection. Check for: Redness, swelling, or pain. Fluid or blood. If your puncture site starts to bleed, lie down on your back, apply firm pressure to the area, and contact your health care provider. Warmth. Pus or a bad smell. A pea or small marble sized lump at the site is normal and can take up to three months to resolve.  Driving Do not drive for at least 4 days after your procedure or however long your health care provider recommends. (Do not resume driving if you have previously been instructed not to drive for other health reasons.) Do not drive or use heavy machinery while taking prescription pain medicine. Activity Avoid activities that take a lot of effort for at least 7 days after your procedure. Do not lift anything that is heavier than 5 lb (4.5 kg) for one week.  No sexual activity for 1  week.  Return to your normal activities as told by your health care provider. Ask your health care provider what activities are safe for you. General instructions Take over-the-counter and prescription medicines only as told by your health care provider. Do not use any products that contain nicotine or tobacco, such as cigarettes and e-cigarettes. If you need help quitting, ask your health care provider. You may shower after 24 hours, but Do not take baths, swim, or use a hot tub for 1 week.  Do not drink alcohol for 24 hours after your procedure. Keep all follow-up visits as told by your health care provider. This is important. Contact a health care provider if: You have redness, mild swelling, or pain around your puncture site. You have fluid or blood coming from your puncture site that stops after applying firm pressure to the area. Your puncture site feels warm to the touch. You have pus or a bad smell coming from your puncture site. You have a fever. You have chest pain or discomfort that spreads to your neck, jaw, or arm. You have chest pain that is worse with lying on your back or taking a deep breath. You are sweating a lot. You feel nauseous. You have a fast or irregular heartbeat. You have shortness of breath. You are dizzy or light-headed and feel the need to lie down. You have pain or numbness in the arm or leg closest to your puncture site. Get help right away if: Your puncture site suddenly swells. Your puncture site is bleeding and the bleeding does not stop after applying firm pressure to the area. These symptoms may represent a serious problem that is an emergency. Do not wait to see if the symptoms will go away. Get medical help right away. Call your local emergency services (911 in the [place] Do not drive yourself to the hospital. Summary After the procedure, it is normal to have bruising and tenderness at the puncture site in your groin, neck, or forearm. Check your  puncture site every day for signs of infection. Get help right away if your puncture site is bleeding and the bleeding does not stop after applying firm pressure to the area. This is a medical emergency. This information is not intended to replace advice given to  you by your health care provider. Make sure you discuss any questions you have with your health care provider.

## 2023-04-29 NOTE — Progress Notes (Signed)
Electrophysiology Office Note:   Date:  04/29/2023  ID:  Jorge Kelly, DOB [birth year], MRN [id number]  Primary Cardiologist: Tobias Alexander, MD Electrophysiologist: Regan Lemming, MD      History of Present Illness:   Jorge Kelly is a 67 y.o. male with h/o Jorge Kelly artery disease, atrial fibrillation seen today for routine electrophysiology followup.   He was diagnosed with atrial fibrillation in 2015.  He has been on flecainide.  In 2022 he has had more frequent episodes of atrial fibrillation over the last few months.  He wore a cardiac monitor that showed a 100% atrial fibrillation burden with symptoms of palpitations, fatigue, dizziness.  He had an A-fib ablation 06/15/2021.  He went back into atrial fibrillation and was loaded on amiodarone in 2023 with chemical cardioversion.  He is unfortunately continued to have episodes of atrial fibrillation making him feel fatigued and short of breath.  Since last being seen in our clinic the patient reports that he has been in atrial fibrillation for the last few weeks to months.  He feels fatigued and short of breath.  He was taken off of amiodarone at the beginning of this year as this was not designed to be a long-term medication, and is unfortunately continued to have episodes of atrial fibrillation.  he denies chest pain, palpitations, dyspnea, PND, orthopnea, nausea, vomiting, dizziness, syncope, edema, weight gain, or early satiety.     Review of systems complete and found to be negative unless listed in HPI.   EP Information / Studies Reviewed:    EKG is ordered today. Personal review as below.  EKG Interpretation Date/Time:  Monday April 29 2023 11:22:23 EDT Ventricular Rate:  97 PR Interval:    QRS Duration:  100 QT Interval:  318 QTC Calculation: 403 R Axis:   40  Text Interpretation: Atrial fibrillation When compared with ECG of 13-Feb-2023 09:29, Atrial fibrillation has replaced Sinus rhythm QT has shortened Confirmed  by Monzerat Handler ([id number]) on 04/29/2023 11:29:08 AM   Risk Assessment/Calculations:    CHA2DS2-VASc Score = 2   This indicates a 2.2% annual risk of stroke. The patient's score is based upon: CHF History: 0 HTN History: 0 Diabetes History: 0 Stroke History: 0 Vascular Disease History: 1 Age Score: 1 Gender Score: 0             Physical Exam:   VS:  BP 124/80 (BP Location: Left Arm, Patient Position: Sitting, Cuff Size: Large)   Pulse 97   Ht 6\' 1"  (1.854 m)   Wt 250 lb 9.6 oz (113.7 kg)   SpO2 96%   BMI 33.06 kg/m    Wt Readings from Last 3 Encounters:  04/29/23 250 lb 9.6 oz (113.7 kg)  04/04/23 249 lb 12.8 oz (113.3 kg)  02/13/23 252 lb 6.4 oz (114.5 kg)     GEN: Well nourished, well developed in no acute distress NECK: No JVD; No carotid bruits CARDIAC: Regular rate and rhythm, no murmurs, rubs, gallops RESPIRATORY:  Clear to auscultation without rales, wheezing or rhonchi  ABDOMEN: Soft, non-tender, non-distended EXTREMITIES:  No edema; No deformity   ASSESSMENT AND PLAN:    1.  Persistent atrial fibrillation: Currently on diltiazem.  Has unfortunately continued to have frequent episodes of atrial fibrillation.  He feels quite fatigued, short of breath but he attributes to his atrial fibrillation.  He would like a rhythm control strategy.  Would like to avoid long-term antiarrhythmics.  Due to that, we Ameria Sanjurjo plan for ablation.  Risk and benefits of been discussed.  He understands these risks and is agreed to the procedure.  In the interim, we Beckett Maden load amiodarone and plan for cardioversion.  Risk, benefits, and alternatives to EP study and radiofrequency/pulse field ablation for afib were also discussed in detail today. These risks include but are not limited to stroke, bleeding, vascular damage, tamponade, perforation, damage to the esophagus, lungs, and other structures, pulmonary vein stenosis, worsening renal function, and death. The patient understands these risk and  wishes to proceed.  We Sayyid Harewood therefore proceed with catheter ablation at the next available time.  Carto, ICE, anesthesia are requested for the procedure.  Nelsy Madonna also obtain CT PV protocol prior to the procedure to exclude LAA thrombus and further evaluate atrial anatomy.  2.  Secondary hypercoagulable state: Currently on Eliquis for atrial fibrillation  3.  Obesity: Lifestyle modification encouraged  4.  Coronary artery disease: Continue statin for elevated calcium score  Follow up with Dr. Elberta Fortis as usual post procedure  Signed, Nilam Quakenbush Jorja Loa, MD

## 2023-05-10 ENCOUNTER — Emergency Department: Payer: Medicare Other

## 2023-05-10 ENCOUNTER — Emergency Department
Admission: EM | Admit: 2023-05-10 | Discharge: 2023-05-10 | Disposition: A | Discharge status: Home / Self Care | Payer: Medicare Other | Attending: Emergency Medicine | Admitting: Emergency Medicine

## 2023-05-10 DIAGNOSIS — N132 Hydronephrosis with renal and ureteral calculous obstruction: Secondary | ICD-10-CM | POA: Insufficient documentation

## 2023-05-10 DIAGNOSIS — M47816 Spondylosis without myelopathy or radiculopathy, lumbar region: Secondary | ICD-10-CM | POA: Insufficient documentation

## 2023-05-10 HISTORY — DX: Essential (primary) hypertension: I10

## 2023-05-10 LAB — LAB USE ONLY - CBC WITH DIFFERENTIAL
Absolute Basophils: 0.03 10*3/uL (ref 0.00–0.08)
Absolute Eosinophils: 0 10*3/uL (ref 0.00–0.44)
Absolute Immature Granulocytes: 0.04 10*3/uL (ref 0.00–0.07)
Absolute Lymphocytes: 0.69 10*3/uL (ref 0.42–3.22)
Absolute Monocytes: 0.52 10*3/uL (ref 0.21–0.85)
Absolute Neutrophils: 11.64 10*3/uL — ABNORMAL HIGH (ref 1.10–6.33)
Absolute nRBC: 0 10*3/uL (ref <=0.00)
Basophils %: 0.2 %
Eosinophils %: 0 %
Hematocrit: 42.8 % (ref 37.6–49.6)
Hemoglobin: 14.5 g/dL (ref 12.5–17.1)
Immature Granulocytes %: 0.3 %
Lymphocytes %: 5.3 %
MCH: 30.9 pg (ref 25.1–33.5)
MCHC: 33.9 g/dL (ref 31.5–35.8)
MCV: 91.3 fL (ref 78.0–96.0)
MPV: 10.3 fL (ref 8.9–12.5)
Monocytes %: 4 %
Neutrophils %: 90.2 %
Platelet Count: 160 10*3/uL (ref 142–346)
Preliminary Absolute Neutrophil Count: 11.64 10*3/uL — ABNORMAL HIGH (ref 1.10–6.33)
RBC: 4.69 10*6/uL (ref 4.20–5.90)
RDW: 13 % (ref 11–15)
WBC: 12.92 10*3/uL — ABNORMAL HIGH (ref 3.10–9.50)
nRBC %: 0 /100{WBCs} (ref <=0.0)

## 2023-05-10 LAB — URINALYSIS WITH REFLEX TO MICROSCOPIC EXAM - REFLEX TO CULTURE
Urine Bilirubin: NEGATIVE
Urine Glucose: NEGATIVE
Urine Ketones: NEGATIVE mg/dL
Urine Leukocyte Esterase: NEGATIVE
Urine Nitrite: NEGATIVE
Urine Specific Gravity: 1.02 (ref 1.001–1.035)
Urine Urobilinogen: NORMAL mg/dL (ref 0.2–2.0)
Urine pH: 6 (ref 5.0–8.0)

## 2023-05-10 LAB — LAB USE ONLY - URINE GRAY CULTURE HOLD TUBE

## 2023-05-10 LAB — COMPREHENSIVE METABOLIC PANEL
ALT: 17 U/L (ref 0–55)
AST (SGOT): 25 U/L (ref 5–41)
Albumin/Globulin Ratio: 1.3 (ref 0.9–2.2)
Albumin: 4.2 g/dL (ref 3.5–5.0)
Alkaline Phosphatase: 66 U/L (ref 37–117)
Anion Gap: 11 (ref 5.0–15.0)
BUN: 19 mg/dL (ref 9–28)
Bilirubin, Total: 0.6 mg/dL (ref 0.2–1.2)
CO2: 21 meq/L (ref 17–29)
Calcium: 9.5 mg/dL (ref 8.5–10.5)
Chloride: 107 meq/L (ref 99–111)
Creatinine: 1.4 mg/dL (ref 0.5–1.5)
GFR: 55.8 mL/min/{1.73_m2} — ABNORMAL LOW (ref >=60.0)
Globulin: 3.3 g/dL (ref 2.0–3.6)
Glucose: 106 mg/dL — ABNORMAL HIGH (ref 70–100)
Potassium: 4.6 meq/L (ref 3.5–5.3)
Protein, Total: 7.5 g/dL (ref 6.0–8.3)
Sodium: 139 meq/L (ref 135–145)

## 2023-05-10 MED ORDER — ACETAMINOPHEN 500 MG PO TABS
1000.0000 mg | ORAL_TABLET | Freq: Four times a day (QID) | ORAL | 0 refills | Status: AC | PRN
Start: 2023-05-10 — End: ?

## 2023-05-10 MED ORDER — OXYCODONE HCL 5 MG PO TABS
5.0000 mg | ORAL_TABLET | Freq: Three times a day (TID) | ORAL | 0 refills | Status: AC | PRN
Start: 2023-05-10 — End: 2023-05-17

## 2023-05-10 MED ORDER — IBUPROFEN 800 MG PO TABS
800.0000 mg | ORAL_TABLET | Freq: Three times a day (TID) | ORAL | 0 refills | Status: AC | PRN
Start: 2023-05-10 — End: ?

## 2023-05-10 MED ORDER — LIDOCAINE 5 % EX PTCH
1.0000 | MEDICATED_PATCH | CUTANEOUS | 0 refills | Status: AC
Start: 2023-05-10 — End: ?

## 2023-05-10 MED ORDER — LIDOCAINE 5 % EX PTCH
1.0000 | MEDICATED_PATCH | CUTANEOUS | Status: DC
Start: 2023-05-10 — End: 2023-05-10
  Administered 2023-05-10: 1 via TRANSDERMAL
  Filled 2023-05-10: qty 1

## 2023-05-10 MED ORDER — TAMSULOSIN HCL 0.4 MG PO CAPS
0.4000 mg | ORAL_CAPSULE | Freq: Once | ORAL | Status: AC
Start: 2023-05-10 — End: 2023-05-10
  Administered 2023-05-10: 0.4 mg via ORAL
  Filled 2023-05-10: qty 1

## 2023-05-10 MED ORDER — ONDANSETRON HCL 4 MG/2ML IJ SOLN
4.0000 mg | Freq: Once | INTRAMUSCULAR | Status: AC
Start: 2023-05-10 — End: 2023-05-10
  Administered 2023-05-10: 4 mg via INTRAVENOUS
  Filled 2023-05-10: qty 2

## 2023-05-10 MED ORDER — TAMSULOSIN HCL 0.4 MG PO CAPS
0.4000 mg | ORAL_CAPSULE | Freq: Every day | ORAL | 0 refills | Status: DC
Start: 2023-05-10 — End: 2023-05-24

## 2023-05-10 MED ORDER — OXYCODONE-ACETAMINOPHEN 5-325 MG PO TABS
1.0000 | ORAL_TABLET | Freq: Once | ORAL | Status: AC
Start: 2023-05-10 — End: 2023-05-10
  Administered 2023-05-10: 1 via ORAL
  Filled 2023-05-10: qty 1

## 2023-05-10 MED ORDER — METHOCARBAMOL 750 MG PO TABS
750.0000 mg | ORAL_TABLET | Freq: Three times a day (TID) | ORAL | 0 refills | Status: AC | PRN
Start: 2023-05-10 — End: ?

## 2023-05-10 MED ORDER — METHOCARBAMOL 1000 MG/10ML IJ SOLN
1000.0000 mg | Freq: Once | INTRAVENOUS | Status: AC
Start: 2023-05-10 — End: 2023-05-10
  Administered 2023-05-10: 1000 mg via INTRAVENOUS
  Filled 2023-05-10: qty 10

## 2023-05-10 MED ORDER — KETOROLAC TROMETHAMINE 30 MG/ML IJ SOLN
15.0000 mg | Freq: Once | INTRAMUSCULAR | Status: AC
Start: 2023-05-10 — End: 2023-05-10
  Administered 2023-05-10: 15 mg via INTRAVENOUS
  Filled 2023-05-10: qty 1

## 2023-05-10 NOTE — Discharge Instructions (Addendum)
Take medications as prescribed.  If you develop any new lower extremity weakness loss of bowel bladder control or numbness in her perineum area please return to the emergency department immediately

## 2023-05-10 NOTE — ED Triage Notes (Signed)
Woke at 2330 with non radiating sharp R lower back pain. Hx of 3 herniated L3-5 discs. Pt also reports increasing bilateral foot numbness increasing in severity since 0000. Denies bending/lifting/twisting.  Denies saddle paraesthesias or loss of bowel/bladder functioning.  Took 400mg  Ibuprofen at [facility]

## 2023-05-10 NOTE — ED Provider Notes (Signed)
EMERGENCY DEPARTMENT NOTE     Patient initially seen and examined at   ED PHYSICIAN ASSIGNED       None           ED MIDLEVEL (APP) ASSIGNED       Date/Time Event User Comments    05/10/23 0048 PA/NP Provider Assigned , Rulon Eisenmenger, FNP assigned as Nurse Practitioner            HISTORY OF PRESENT ILLNESS   Translator Used : No    Chief Complaint: Back Pain and Numbness (/)       67 y.o. male with past medical history as below presenting with lower back pain starting at 2330.  Patient states that pain is in the right lower side of his back does not radiate down his leg.  States he has history of herniated disc that feels similar.  He denies any heavy lifting falls or trauma.  Denies loss of bowel/bladder control, extremity weakness, saddle anesthesia.  He endorses numbness to the right foot.  Has been ambulating with no difficulty.  Took ibuprofen prior to arrival.  Denies dysuria, hematuria, frequency.  Denies abdominal pain, nausea/vomiting, fevers.    Independent Historian (other than patient): Spouse  Additional History Provided by Independent Historian: Patient took 400 mg ibuprofen prior to arrival.  MEDICAL HISTORY     Past Medical History:  Past Medical History:   Diagnosis Date    Hypertension        Past Surgical History:  Past Surgical History[1]    Social History:  Social History[2]    Family History:  Family History[3]    Outpatient Medication:  Discharge Medication List as of 05/10/2023  5:17 AM        CONTINUE these medications which have NOT CHANGED    Details   losartan (COZAAR) 25 MG tablet Take 1 tablet (25 mg) by mouth daily, Historical Med               REVIEW OF SYSTEMS   Review of Systems See History of Present Illness  PHYSICAL EXAM     ED Triage Vitals   Encounter Vitals Group      BP       Systolic BP Percentile       Diastolic BP Percentile       Pulse       Resp       Temp       Temp src       SpO2       Weight       Height       Head Circumference       Peak Flow        Pain Score       Pain Loc       Pain Education       Exclude from Growth Chart      Physical Exam  Vitals and nursing note reviewed.   Constitutional:       General: He is not in acute distress.     Appearance: Normal appearance.   HENT:      Head: Normocephalic and atraumatic.      Nose: Nose normal.      Mouth/Throat:      Mouth: Mucous membranes are moist.   Eyes:      Extraocular Movements: Extraocular movements intact.   Pulmonary:      Effort: Pulmonary effort is normal.   Musculoskeletal:  Cervical back: Neck supple. No bony tenderness.      Thoracic back: No bony tenderness.      Lumbar back: Tenderness present.   Skin:     General: Skin is dry.   Neurological:      General: No focal deficit present.      Mental Status: He is alert. Mental status is at baseline.      Motor: No weakness.      Gait: Gait is intact.   Psychiatric:         Mood and Affect: Mood normal.          MEDICAL DECISION MAKING     PRIMARY PROBLEM LIST      Acute illness/injury with risk to life or bodily function (based on differential diagnosis or evaluation) DIAGNOSIS: spondylosis, hematuria  Chronic Illness Impacting Care of the above problem: Advanced age Increases the risk of severe disease  Differential diagnosis including but not limited to cauda equina, compression fracture, herniated disc, cystitis, nephrolithiasis    DISCUSSION        67 year old male presenting with low back pain and foot numbness. Has known herniated discs. Patient is clearly uncomfortable on arrival to the emergency department we will proceed with CT lumbar spine and check UA to rule out infection.CT shows no acute fractures.  Low concern for cauda equina no loss of bowel/bladder control, saddle anesthesia, extremity weakness.  He is ambulating with a steady gait.  States pain is controlled with medications.UA shows no signs of infection but there is large amount of hematuria.  Will add blood work and CT abdomen to evaluate for kidney stone.Patient  signed out to Dr. Imogene Burn pending CT abdomen.    Discussed case with attending, Dr. Imogene Burn, who is agreeable to plan of care, treatment, and disposition.       External Records Reviewed?: Physician Office Records      Vital Signs: Reviewed the patient's vital signs.   Nursing Notes: Reviewed and utilized available nursing notes.  Medical Records Reviewed: Reviewed available past medical records.  Counseling: The emergency provider has spoken with the patient and discussed today's findings, in addition to providing specific details for the plan of care.  Questions are answered and there is agreement with the plan.      RADIOLOGY IMAGING STUDIES      CT Abd/Pelvis without Contrast   Final Result          1.Obstructing 3 mm right proximal ureteral calculus causing mild to   moderate hydronephrosis.   2. No other renal or ureteral calculi are seen.   3. There is no definite acute inflammatory process.   4. Prostatic enlargement is noted. Other findings as noted above.            Miguel Dibble, MD   05/10/2023 4:45 AM      CT L- Spine without Contrast   Final Result       1.Spinal alignment and multilevel spondylosis as above.      Leandro Reasoner, MD   05/10/2023 2:06 AM          EMERGENCY IMAGING STUDIES    The following imagine studies were independently interpreted by me (emergency medicine provider):    CT Lumbar Spine Interpreted by me (ED Provider)  Comparison: None available  RESULT: No fracture  IMPRESSION: No acute abnormality    EMERGENCY DEPT. MEDICATIONS      ED Medication Orders (From admission, onward)      Start  Ordered     Status Ordering Provider    05/10/23 0507 05/10/23 0506  oxyCODONE-acetaminophen (PERCOCET) 5-325 MG per tablet 1 tablet  Once        Route: Oral  Ordered Dose: 1 tablet       Last MAR action: Given CHEN, STEVEN B    05/10/23 0507 05/10/23 0506  tamsulosin (FLOMAX) capsule 0.4 mg  Once        Route: Oral  Ordered Dose: 0.4 mg       Last MAR action: Given CHEN, STEVEN B    05/10/23 0122 05/10/23  0121  ondansetron (ZOFRAN) injection 4 mg  Once        Route: Intravenous  Ordered Dose: 4 mg       Last MAR action: Given ,  B    05/10/23 0106 05/10/23 0105  ketorolac (TORADOL) injection 15 mg  Once        Route: Intravenous  Ordered Dose: 15 mg       Last MAR action: Given ,  B    05/10/23 0106 05/10/23 0105  methocarbamol (ROBAXIN) 1,000 mg in dextrose 5 % 250 mL IVPB  Once        Route: Intravenous  Ordered Dose: 1,000 mg       Last MAR action: Stopped ,  B    05/10/23 0106 05/10/23 0105    Every 24 hours        Route: Transdermal  Ordered Dose: 1 patch       Discontinued ,  B            LABORATORY RESULTS    Ordered and independently interpreted AVAILABLE laboratory tests.   Results       Procedure Component Value Units Date/Time    Comprehensive Metabolic Panel [[id number]]  (Abnormal) Collected: 05/10/23 0324    Specimen: Blood, Venous Updated: 05/10/23 0404     Glucose 106 mg/dL      BUN 19 mg/dL      Creatinine 1.4 mg/dL      Sodium 010 mEq/L      Potassium 4.6 mEq/L      Chloride 107 mEq/L      CO2 21 mEq/L      Calcium 9.5 mg/dL      Anion Gap 27.2     GFR 55.8 mL/min/1.73 m2      AST (SGOT) 25 U/L      ALT 17 U/L      Alkaline Phosphatase 66 U/L      Albumin 4.2 g/dL      Protein, Total 7.5 g/dL      Globulin 3.3 g/dL      Albumin/Globulin Ratio 1.3     Bilirubin, Total 0.6 mg/dL     CBC with Differential (Order) [[id number]]  (Abnormal) Collected: 05/10/23 0324    Specimen: Blood, Venous Updated: 05/10/23 0346    Narrative:      The following orders were created for panel order CBC with Differential (Order).  Procedure                               Abnormality         Status                     ---------                               -----------         ------  CBC with Differential (C.Marland KitchenMarland Kitchen[[id number]]  Abnormal            Final result                 Please view results for these tests on the individual orders.    CBC with Differential  (Component) [[id number]]  (Abnormal) Collected: 05/10/23 0324    Specimen: Blood, Venous Updated: 05/10/23 0346     WBC 12.92 x10 3/uL      Hemoglobin 14.5 g/dL      Hematocrit 87.5 %      Platelet Count 160 x10 3/uL      MPV 10.3 fL      RBC 4.69 x10 6/uL      MCV 91.3 fL      MCH 30.9 pg      MCHC 33.9 g/dL      RDW 13 %      nRBC % 0.0 /100 WBC      Absolute nRBC 0.00 x10 3/uL      Preliminary Absolute Neutrophil Count 11.64 x10 3/uL      Neutrophils % 90.2 %      Lymphocytes % 5.3 %      Monocytes % 4.0 %      Eosinophils % 0.0 %      Basophils % 0.2 %      Immature Granulocytes % 0.3 %      Absolute Neutrophils 11.64 x10 3/uL      Absolute Lymphocytes 0.69 x10 3/uL      Absolute Monocytes 0.52 x10 3/uL      Absolute Eosinophils 0.00 x10 3/uL      Absolute Basophils 0.03 x10 3/uL      Absolute Immature Granulocytes 0.04 x10 3/uL     Urinalysis with Reflex to Microscopic Exam and Culture [[id number]]  (Abnormal) Collected: 05/10/23 0228    Specimen: Urine, Clean Catch Updated: 05/10/23 0239     Urine Color Straw     Urine Clarity Clear     Urine Specific Gravity 1.020     Urine pH 6.0     Urine Leukocyte Esterase Negative     Urine Nitrite Negative     Urine Protein 10= Trace     Urine Glucose Negative     Urine Ketones Negative mg/dL      Urine Urobilinogen Normal mg/dL      Urine Bilirubin Negative     Urine Blood Moderate     RBC, UA Too numerous to count /hpf      Urine WBC 0-5 /hpf      Urine Squamous Epithelial Cells 0-5 /hpf               CRITICAL CARE/PROCEDURES    Procedures    DIAGNOSIS      Diagnosis:  Final diagnoses:   Spondylosis of lumbar spine   Ureteral stone with hydronephrosis       Disposition:  ED Disposition       ED Disposition   Discharge from ED Observation    Condition   --    Date/Time   Fri May 10, 2023  5:14 AM    Comment   --               Prescriptions:  Discharge Medication List as of 05/10/2023  5:17 AM        START taking these medications    Details   acetaminophen (TYLENOL) 500 MG  tablet Take 2 tablets (1,000 mg) by mouth  every 6 (six) hours as needed for Pain, Starting Fri 05/10/2023, E-Rx      ibuprofen (ADVIL) 800 MG tablet Take 1 tablet (800 mg) by mouth every 8 (eight) hours as needed for Pain, Starting Fri 05/10/2023, E-Rx      lidocaine (LIDODERM) 5 % Place 1 patch onto the skin every 24 hours Remove & Discard patch within 12 hours or as directed by MD, Starting Fri 05/10/2023, E-Rx      methocarbamol (ROBAXIN) 750 MG tablet Take 1 tablet (750 mg) by mouth 3 (three) times daily as needed (for spasm), Starting Fri 05/10/2023, Normal      oxyCODONE (ROXICODONE) 5 MG immediate release tablet Take 1-2 tablets (5-10 mg) by mouth every 8 (eight) hours as needed for Pain, Starting Fri 05/10/2023, Until Fri 05/17/2023 at 2359, E-Rx      tamsulosin (FLOMAX) 0.4 MG Cap Take 1 capsule (0.4 mg) by mouth daily for 14 days Discontinue for lightheadedness, Starting Fri 05/10/2023, Until Fri 05/24/2023, E-Rx           CONTINUE these medications which have NOT CHANGED    Details   losartan (COZAAR) 25 MG tablet Take 1 tablet (25 mg) by mouth daily, Historical Med                 This note was generated by the Epic EMR system/ Dragon speech recognition and may contain inherent errors or omissions not intended by the user. Grammatical errors, random word insertions, deletions and pronoun errors  are occasional consequences of this technology due to software limitations. Not all errors are caught or corrected. If there are questions or concerns about the content of this note or information contained within the body of this dictation they should be addressed directly with the author for clarification.         [1] History reviewed. No pertinent surgical history.  [2]   Social History  Socioeconomic History    Marital status: Married   Tobacco Use    Smoking status: Never    Smokeless tobacco: Never   [3] No family history on file.       Lennette Bihari, FNP  05/10/23 1815

## 2023-05-13 ENCOUNTER — Telehealth: Payer: Medicare Other

## 2023-05-14 ENCOUNTER — Encounter (INDEPENDENT_AMBULATORY_CARE_PROVIDER_SITE_OTHER): Payer: Self-pay | Admitting: Urology

## 2023-05-14 ENCOUNTER — Ambulatory Visit (INDEPENDENT_AMBULATORY_CARE_PROVIDER_SITE_OTHER): Payer: No Typology Code available for payment source | Admitting: Urology

## 2023-05-14 ENCOUNTER — Other Ambulatory Visit (HOSPITAL_COMMUNITY): Payer: Self-pay | Admitting: Physician Assistant

## 2023-05-14 VITALS — BP 158/57 | HR 92 | Ht 68.9 in | Wt 170.0 lb

## 2023-05-14 DIAGNOSIS — N1330 Unspecified hydronephrosis: Secondary | ICD-10-CM | POA: Insufficient documentation

## 2023-05-14 DIAGNOSIS — N201 Calculus of ureter: Secondary | ICD-10-CM | POA: Insufficient documentation

## 2023-05-14 DIAGNOSIS — I4819 Other persistent atrial fibrillation: Secondary | ICD-10-CM

## 2023-05-14 LAB — URINE MICROSCOPIC

## 2023-05-14 LAB — URINALYSIS POCT
Urine Bilirubin POCT: NEGATIVE
Urine Blood POCT: NEGATIVE
Urine Glucose POCT: NEGATIVE mg/dL
Urine Ketones POCT: 40 mg/dL — AB
Urine Leukocyte Esterase POCT: NEGATIVE
Urine Nitrites POCT: NEGATIVE
Urine Protein POCT: 30 mg/dL — AB
Urine Specific Gravity POCT: 1.015
Urine Urobilinogen POCT: 0.2 mg/dL (ref 0.2–2.0)
Urine pH POCT: 5.5 (ref 5.0–8.0)

## 2023-05-14 MED ORDER — ONDANSETRON 8 MG PO TBDP
8.0000 mg | ORAL_TABLET | Freq: Three times a day (TID) | ORAL | 0 refills | Status: AC | PRN
Start: 2023-05-14 — End: ?

## 2023-05-14 MED ORDER — TAMSULOSIN HCL 0.4 MG PO CAPS
0.4000 mg | ORAL_CAPSULE | Freq: Every day | ORAL | 0 refills | Status: AC
Start: 2023-05-14 — End: 2023-05-28

## 2023-05-14 NOTE — Patient Instructions (Signed)
Your recent CT showed a 3mm right ureteral stone with obstruction.  A stone this size is generally able to pass, but could take up to 40 days.  I recommend continuing Flomax 0.4mg  daily and strain all urine.  I recommend Miralax and/or Magnesium Citrate for constipation.  You might consider ureteroscopic stone management due to severity of symptoms.  If you are considering surgery, please contact Stefanie at [phone] to schedule  Please proceed to the ER for any worsening or acute changes to symptoms.

## 2023-05-14 NOTE — Progress Notes (Signed)
Subjective:   Patient ID: Marcus Mcneil is a 66 y.o. male     Chief Complaint:    67 y/o M presents today for evaluation and treatment of a 3mm R ureteral stone.  Marcus Mcneil was seen and evaluated in the ER on 05/10/23 for R flank/lower back pain.  Does have a h/o herniated disc x 3, but UA in ER showed microscopic hematuria.  CT showed 3mm R ureteral stone with hydronephrosis.  No associated F/C, but having nausea and constipation.  First bout of stones.    The following portions of the patient's history were reviewed and updated as appropriate: allergies, current medications, past family history, past medical history, past social history, past surgical history and problem list.    Review of Systems  History obtained from the patient  General ROS: negative for - chills, fatigue, fever or weight loss  Psychological ROS: negative for - depression, disorientation or memory difficulties  Ophthalmic ROS: negative for - blurry vision or loss of vision  Hematological and Lymphatic ROS: negative for - bleeding problems, night sweats or swollen lymph nodes  Endocrine ROS: negative for - malaise/lethargy, polydipsia/polyuria or skin changes  Respiratory ROS: no cough, shortness of breath, or wheezing  Cardiovascular ROS: no chest pain or dyspnea on exertion  Gastrointestinal ROS: no abdominal pain, change in bowel habits, or black or bloody stools  Genito-Urinary ROS:  as per HPI  Musculoskeletal ROS: negative for - joint pain or muscle pain  Neurological ROS: negative for - headaches, impaired coordination/balance or numbness/tingling    Objective:   BP 158/57 (BP Site: Left arm, Patient Position: Sitting, Cuff Size: Medium)   Pulse 92   Ht 1.75 m (5' 8.9")   Wt 77.1 kg (170 lb)   BMI 25.18 kg/m   General appearance - alert, well appearing, and in no distress  Mental status - alert, oriented to person, place, and time, appropriate affect  Chest - no use of accessory respiratory muscles    Lab Review   Urine analysis  shows +protein, +ketones    Radiology Review   Narrative & Impression   HISTORY: hematuria, back pain. . The patient complains of right flank pain.     COMPARISON: None available.     TECHNIQUE: CT abdomen and pelvis WITHOUT intravenous contrast. Oral  contrast was not administered. The following dose reduction techniques were  utilized: Automated exposure control and/or adjustment of the mA and/or kV  according to patient size, and the use of iterative reconstruction  technique.     CONTRAST:  None.     FINDINGS: Please note that evaluation of the viscera and vasculature is  limited in the absence of IV contrast.       No intrarenal calculi are seen. The right kidney shows mild to moderate  hydronephrosis. The left kidney shows no hydronephrosis. The ureters are  nondilated. No significant perinephric fluid or stranding is seen. A 3 mm  right proximal ureteral calculus is visible on axial image 76 series 302.  This ureteral calculus is not visible on the scout view. No other definite  ureteral calculi are seen. The urinary bladder is grossly unremarkable but  is suboptimally distended. No calculi are seen within the lumen of the  urinary bladder. The prostate gland is mildly enlarged. The prostate gland  measures 6.0 x 4.9 cm. There is no pelvic sidewall adenopathy or other  definite pelvic mass.     There is no hiatal hernia. The lung bases are  clear. Parapelvic cysts are  noted in the left kidney. The liver, spleen, pancreas, adrenal glands, and  kidneys show no solid masses.     The gallbladder and bile ducts are unremarkable. The portal vein and other  vascular structures cannot be evaluated due to the lack of IV contrast.  There is no CT evidence of pancreatitis. The abdominal aorta is  nonaneurysmal. There is no retroperitoneal adenopathy, hemorrhage or mass.     The appendix is not identified with certainty, but there is no pericecal  inflammation. There is no bowel dilatation or definite wall  thickening.  There is no CT evidence of colitis or diverticulitis. No omental or  mesenteric abnormality is seen. There is no intraperitoneal free air or  ascites. No abscess or other drainable fluid collection is seen.      The abdominal wall soft tissues show no significant findings. No acute  appearing bony abnormalities are identified. Spinal degenerative changes  are noted. The bones appear osteopenic. No neoplastic bone lesions are  identified.     IMPRESSION:         1.Obstructing 3 mm right proximal ureteral calculus causing mild to  moderate hydronephrosis.  2. No other renal or ureteral calculi are seen.  3. There is no definite acute inflammatory process.  4. Prostatic enlargement is noted. Other findings as noted above.           Marcus Dibble, MD  05/10/2023 4:45 AM     Assessment:     1. Right ureteral stone    2. Hydronephrosis, right         Plan:     Patient Instructions   Your recent CT showed a 3mm right ureteral stone with obstruction.  A stone this size is generally able to pass, but could take up to 40 days.  I recommend continuing Flomax 0.4mg  daily and strain all urine.  I recommend Miralax and/or Magnesium Citrate for constipation.  You might consider ureteroscopic stone management due to severity of symptoms.  If you are considering surgery, please contact Stefanie at [phone] to schedule  Please proceed to the ER for any worsening or acute changes to symptoms.    Orders  Orders Placed This Encounter   Procedures    Urine Microscopic

## 2023-05-15 ENCOUNTER — Emergency Department: Payer: Medicare Other

## 2023-05-15 ENCOUNTER — Telehealth (INDEPENDENT_AMBULATORY_CARE_PROVIDER_SITE_OTHER): Payer: Self-pay

## 2023-05-15 ENCOUNTER — Emergency Department
Admission: EM | Admit: 2023-05-15 | Discharge: 2023-05-15 | Disposition: A | Discharge status: Home / Self Care | Payer: Medicare Other | Attending: Emergency Medicine | Admitting: Emergency Medicine

## 2023-05-15 DIAGNOSIS — K5900 Constipation, unspecified: Secondary | ICD-10-CM | POA: Insufficient documentation

## 2023-05-15 DIAGNOSIS — N201 Calculus of ureter: Secondary | ICD-10-CM | POA: Insufficient documentation

## 2023-05-15 HISTORY — DX: Calculus of kidney: N20.0

## 2023-05-15 LAB — LAB USE ONLY - CBC WITH DIFFERENTIAL
Absolute Basophils: 0.02 10*3/uL (ref 0.00–0.08)
Absolute Eosinophils: 0.05 10*3/uL (ref 0.00–0.44)
Absolute Immature Granulocytes: 0.03 10*3/uL (ref 0.00–0.07)
Absolute Lymphocytes: 0.86 10*3/uL (ref 0.42–3.22)
Absolute Monocytes: 0.74 10*3/uL (ref 0.21–0.85)
Absolute Neutrophils: 7.37 10*3/uL — ABNORMAL HIGH (ref 1.10–6.33)
Absolute nRBC: 0 10*3/uL (ref <=0.00)
Basophils %: 0.2 %
Eosinophils %: 0.6 %
Hematocrit: 42.9 % (ref 37.6–49.6)
Hemoglobin: 14.6 g/dL (ref 12.5–17.1)
Immature Granulocytes %: 0.3 %
Lymphocytes %: 9.5 %
MCH: 30.9 pg (ref 25.1–33.5)
MCHC: 34 g/dL (ref 31.5–35.8)
MCV: 90.7 fL (ref 78.0–96.0)
MPV: 9.7 fL (ref 8.9–12.5)
Monocytes %: 8.2 %
Neutrophils %: 81.2 %
Platelet Count: 149 10*3/uL (ref 142–346)
Preliminary Absolute Neutrophil Count: 7.37 10*3/uL — ABNORMAL HIGH (ref 1.10–6.33)
RBC: 4.73 10*6/uL (ref 4.20–5.90)
RDW: 13 % (ref 11–15)
WBC: 9.07 10*3/uL (ref 3.10–9.50)
nRBC %: 0 /100{WBCs} (ref <=0.0)

## 2023-05-15 LAB — BASIC METABOLIC PANEL
Anion Gap: 13 (ref 5.0–15.0)
BUN: 19 mg/dL (ref 9–28)
CO2: 25 meq/L (ref 17–29)
Calcium: 9.6 mg/dL (ref 8.5–10.5)
Chloride: 102 meq/L (ref 99–111)
Creatinine: 1.8 mg/dL — ABNORMAL HIGH (ref 0.5–1.5)
GFR: 41.3 mL/min/{1.73_m2} — ABNORMAL LOW (ref >=60.0)
Glucose: 107 mg/dL — ABNORMAL HIGH (ref 70–100)
Potassium: 4.4 meq/L (ref 3.5–5.3)
Sodium: 140 meq/L (ref 135–145)

## 2023-05-15 LAB — URINALYSIS WITH REFLEX TO MICROSCOPIC EXAM - REFLEX TO CULTURE
Urine Bilirubin: NEGATIVE
Urine Glucose: NEGATIVE
Urine Nitrite: NEGATIVE
Urine Specific Gravity: 1.034 (ref 1.001–1.035)
Urine Urobilinogen: NORMAL mg/dL (ref 0.2–2.0)
Urine pH: 7.5 (ref 5.0–8.0)

## 2023-05-15 MED ORDER — ONDANSETRON HCL 4 MG/2ML IJ SOLN
4.0000 mg | Freq: Once | INTRAMUSCULAR | Status: AC
Start: 2023-05-15 — End: 2023-05-15
  Administered 2023-05-15: 4 mg via INTRAVENOUS
  Filled 2023-05-15: qty 2

## 2023-05-15 MED ORDER — FENTANYL CITRATE (PF) 50 MCG/ML IJ SOLN (WRAP)
50.0000 ug | Freq: Once | INTRAMUSCULAR | Status: AC
Start: 2023-05-15 — End: 2023-05-15
  Administered 2023-05-15: 50 ug via INTRAVENOUS
  Filled 2023-05-15: qty 2

## 2023-05-15 MED ORDER — SENNOSIDES-DOCUSATE SODIUM 8.6-50 MG PO TABS
1.0000 | ORAL_TABLET | Freq: Once | ORAL | Status: AC
Start: 2023-05-15 — End: 2023-05-15
  Administered 2023-05-15: 1 via ORAL
  Filled 2023-05-15: qty 1

## 2023-05-15 MED ORDER — DICYCLOMINE HCL 10 MG PO CAPS
10.0000 mg | ORAL_CAPSULE | Freq: Once | ORAL | Status: AC
Start: 2023-05-15 — End: 2023-05-15
  Administered 2023-05-15: 10 mg via ORAL
  Filled 2023-05-15: qty 1

## 2023-05-15 MED ORDER — PEG 3350-KCL-NABCB-NACL+/-NASULF PO SOLR (WRAP)
4000.0000 mL | Freq: Once | ORAL | Status: AC
Start: 2023-05-15 — End: 2023-05-15
  Administered 2023-05-15: 4000 mL via ORAL
  Filled 2023-05-15: qty 4000

## 2023-05-15 MED ORDER — KETOROLAC TROMETHAMINE 30 MG/ML IJ SOLN
15.0000 mg | Freq: Once | INTRAMUSCULAR | Status: AC
Start: 2023-05-15 — End: 2023-05-15
  Administered 2023-05-15: 15 mg via INTRAVENOUS
  Filled 2023-05-15: qty 1

## 2023-05-15 MED ORDER — METHOCARBAMOL 500 MG PO TABS
750.0000 mg | ORAL_TABLET | Freq: Once | ORAL | Status: DC
Start: 2023-05-15 — End: 2023-05-15
  Filled 2023-05-15: qty 2

## 2023-05-15 MED ORDER — ENEMA 7-19 GM/118ML RE ENEM
1.0000 | ENEMA | Freq: Once | RECTAL | Status: AC
Start: 2023-05-15 — End: 2023-05-15
  Administered 2023-05-15: 1 via RECTAL
  Filled 2023-05-15: qty 1

## 2023-05-15 MED ORDER — KETOROLAC TROMETHAMINE 10 MG PO TABS
10.0000 mg | ORAL_TABLET | Freq: Four times a day (QID) | ORAL | 0 refills | Status: AC | PRN
Start: 2023-05-15 — End: ?

## 2023-05-15 MED ORDER — IOHEXOL 350 MG/ML IV SOLN
100.0000 mL | Freq: Once | INTRAVENOUS | Status: AC | PRN
Start: 2023-05-15 — End: 2023-05-15
  Administered 2023-05-15: 100 mL via INTRAVENOUS

## 2023-05-15 MED ORDER — METHOCARBAMOL 500 MG PO TABS
750.0000 mg | ORAL_TABLET | Freq: Once | ORAL | Status: AC
Start: 2023-05-15 — End: 2023-05-15
  Administered 2023-05-15: 750 mg via ORAL
  Filled 2023-05-15: qty 2

## 2023-05-15 NOTE — Telephone Encounter (Signed)
Hello  this is [facility] hospital Pre procedure evaluation clinic calling for your telephone interview for your appointment  05/22/23 with Dr. Leonor Liv. You will receive an automatic message via MyChart with instructions. Please call [phone] option 1 to schedule your interview ASAP. Thank you

## 2023-05-15 NOTE — Discharge Instructions (Addendum)
Thank you for coming to [facility] Emergency Department    You were seen today for constipation  You were prescribed enema and golytely. Please pick up these medications at your pharmacy. Return to the emergency department for any chest pain, shortness of breath, inability to eat, or any other concerns. We are always available!

## 2023-05-15 NOTE — ED Notes (Signed)
Vitals obtained. PIV removed. Pt endorsing symptom improvement at time of dispo. Reviewed discharge instructions, follow-up care, and 1 e-script with pt and wife. Verbalized understanding. All questions addressed. Pt ambulatory to lobby with steady gait with wife at time of discharge.

## 2023-05-15 NOTE — Telephone Encounter (Addendum)
Patient called in. Reported that he was talking to [place] to schedule appt for his kidney stone. He said that he mentioned that his biggest issue is constipation. He was told that she will have one of the nurse call him but no one called  him so far. His last BM was Thursday last week. Per pt, he had tried dulcolax, that did not work. Dr Leonor Liv recommended Miralax, that also did not work. Someone suggested to take Magnesium citrate and that also did not work. He has been drinking a lot of water and urinating quite a bit. He is starting to feel bloated. He is still passing gas but only a little bit. He is asking for recommendation. Informed pt that since he already tried 3 medications and it did not work, I advised him to contact his PCP for further recommendation for this. Advised him that if his bloating gets worst. Any pain, nausea and vomiting, he should go to ER for eval. Pt verbalized understanding and had no further questions or concerns at this time. He said that he will contact his PCP first.

## 2023-05-15 NOTE — ED Provider Notes (Signed)
 EMERGENCY DEPARTMENT NOTE     Patient initially seen and examined at   ED PHYSICIAN ASSIGNED       Date/Time Event User Comments    05/15/23 1429 Physician Assigned , Benny Lennert, DO assigned as Attending           ED [facility] (APP) ASSIGNED       None            HISTORY OF PRESENT ILLNESS       Chief Complaint: Constipation       67 y.o. male with past medical history as below who presents with constipation ongoing since Thursday. At that time patient was diagnosed with a kidney stone and has plans to follow with urology for procedure. He did see them and they started him on colace for the constipation and recommended miralax and or mag citrate for the constipation. He has tried both with an enema with little improvement. Is still passing flatus, with enema had two small pieces which were soft. With the renal stone did take oxycodone on Saturday but has only been taking tylenol and motrin since.     Independent Historian (other than patient): Spouse  Additional History Provided by Independent Historian:  MEDICAL HISTORY     Past Medical History:  Past Medical History:   Diagnosis Date    Hypertension     Kidney stones        Past Surgical History:  Past Surgical History[1]    Social History:  Social History[2]    Family History:  Family History[3]    Outpatient Medication:  Discharge Medication List as of 05/15/2023  7:04 PM        CONTINUE these medications which have NOT CHANGED    Details   acetaminophen (TYLENOL) 500 MG tablet Take 2 tablets (1,000 mg) by mouth every 6 (six) hours as needed for Pain, Starting Fri 05/10/2023, E-Rx      ibuprofen (ADVIL) 800 MG tablet Take 1 tablet (800 mg) by mouth every 8 (eight) hours as needed for Pain, Starting Fri 05/10/2023, E-Rx      lidocaine (LIDODERM) 5 % Place 1 patch onto the skin every 24 hours Remove & Discard patch within 12 hours or as directed by MD, Starting Fri 05/10/2023, E-Rx      losartan (COZAAR) 25 MG tablet Take 1 tablet (25 mg) by mouth daily,  Historical Med      methocarbamol (ROBAXIN) 750 MG tablet Take 1 tablet (750 mg) by mouth 3 (three) times daily as needed (for spasm), Starting Fri 05/10/2023, Normal      ondansetron (ZOFRAN-ODT) 8 MG disintegrating tablet Take 1 tablet (8 mg) by mouth every 8 (eight) hours as needed for Nausea, Starting Tue 05/14/2023, E-Rx      oxyCODONE (ROXICODONE) 5 MG immediate release tablet Take 1-2 tablets (5-10 mg) by mouth every 8 (eight) hours as needed for Pain, Starting Fri 05/10/2023, Until Fri 05/17/2023 at 2359, E-Rx      tamsulosin (FLOMAX) 0.4 MG Cap Take 1 capsule (0.4 mg) by mouth daily for 14 days Discontinue for lightheadedness, Starting Tue 05/14/2023, Until Tue 05/28/2023, E-Rx               REVIEW OF SYSTEMS   Review of Systems   Gastrointestinal:  Positive for abdominal distention, abdominal pain, constipation and nausea. Negative for vomiting.   Genitourinary:  Positive for dysuria (renal stone).    See History of Present Illness  PHYSICAL EXAM     ED  Triage Vitals   Encounter Vitals Group      BP 05/15/23 1421 176/78      Systolic BP Percentile --       Diastolic BP Percentile --       Heart Rate 05/15/23 1421 80      Resp Rate 05/15/23 1421 18      Temp 05/15/23 1421 98.1 F (36.7 C)      Temp src 05/15/23 1421 Oral      SpO2 05/15/23 1421 99 %      Weight 05/15/23 1420 76 kg      Height --       Head Circumference --       Peak Flow --       Pain Score 05/15/23 1420 5      Pain Loc --       Pain Education --       Exclude from Growth Chart --      Physical Exam  Constitutional:       Appearance: He is not diaphoretic.   HENT:      Head: Normocephalic and atraumatic.      Mouth/Throat:      Mouth: Mucous membranes are moist.   Eyes:      Conjunctiva/sclera: Conjunctivae normal.   Cardiovascular:      Rate and Rhythm: Normal rate.   Pulmonary:      Effort: Pulmonary effort is normal. No respiratory distress.      Breath sounds: Normal breath sounds.   Abdominal:      Comments: Soft, distended, decreased  but present bowel sounds. Diffuse ttp no guarding or rebound.   Musculoskeletal:         General: Normal range of motion.      Cervical back: Neck supple.   Skin:     General: Skin is warm.      Capillary Refill: Capillary refill takes less than 2 seconds.   Neurological:      General: No focal deficit present.      Mental Status: He is alert.           MEDICAL DECISION MAKING     PRIMARY PROBLEM LIST      Acute illness/injury DIAGNOSIS:constipation  Chronic Illness Impacting Care of the above problem: Hypertension and Other (explain) renal stoneIncreases the risk of severe disease and Increase the risk of disease progression  Differential Diagnosis: Abdominal Pain: bowel obstruction, abscess, colitis, diverticulitis, appendicitis, peritonitis, bowel perforation, mesenteric ischemia, abdominal aortic aneurysm, cholecystitis, biliay colic, cholangitis     DISCUSSION      This is a 66yoM with history of renal stone presenting with constipation. Is not having vomiting and continues to pass gas which makes bowel obstruction less likely. Possible ileus vs constipation. Soft stool with enema, impaction less likley. Will obtain kub, if negative for obstructive pattern will complete enema and golytely prep.    If patient is being hospitalized is severe sepsis or septic shock suspected?: N/A          External Records Reviewed?: Physician Office Records    Additional Notes              ED Course as of 05/15/23 1924   Wed May 15, 2023   1519 Obstructive bowel pattern seen on XR. Will obtain blood work and contrast enhanced CT.  [ML]   1550 WBC: 9.07 [ML]   1550 Hemoglobin: 14.6 [ML]   1620 Creatinine(!): 1.8  Increased from 1.4 [ML]  1901 Pending UA [ML]   1909 UA concerning for blood [place], no signs of infection will discharge, has enema and GoLytely which she wants to take at home. [ML]      ED Course User Index  [ML] Hedy Camara, DO         Vital Signs: Reviewed the patient's vital signs.   Nursing Notes: Reviewed and  utilized available nursing notes.  Medical Records Reviewed: Reviewed available past medical records.  Counseling: The emergency  has spoken with the patient and discussed today's findings, in addition to providing specific details for the plan of care.  Questions are answered and there is agreement with the plan.      CARDIAC STUDIES    The following cardiac studies were independently interpreted by me the Emergency Medicine .  For full cardiac study results please see chart.                                                  RADIOLOGY IMAGING STUDIES      CT Abd/Pelvis with IV Contrast   Final Result         1. Right UVJ calculus measuring 3 mm compatible with distal migration of   the previously seen calculus in the right proximal ureter. Moderate right   hydronephrosis is slightly increased with increased urothelial thickening   and inflammation. Correlate clinically to exclude a superimposed ascending   urinary tract infection/pyelonephritis.   2. Large stool volume.      Kennyth Lose, MD   05/15/2023 4:57 PM      Abdomen AP   Final Result      1.Nonspecific gaseous distention of large bowel. There is no small bowel   dilatation.   2.Moderate stool burden.      August Albino   05/15/2023 3:23 PM          EMERGENCY IMAGING STUDIES    The following imagine studies were independently interpreted by me (emergency medicine ):               CT Abdomen/Pelvis Interpreted by me (ED )    RESULT: Other (explain)  IMPRESSION: Other (describe) ureteral stone and constipation    EMERGENCY DEPT. MEDICATIONS      ED Medication Orders (From admission, onward)      Start Ordered     Status Ordering     05/15/23 1746 05/15/23 1745  methocarbamol (ROBAXIN) tablet 750 mg  Once        Route: Oral  Ordered Dose: 750 mg       Last MAR action: Given ,     05/15/23 1718 05/15/23 1717  methocarbamol (ROBAXIN) tablet 750 mg  Once        Route: Oral  Ordered Dose: 750 mg       Last MAR action: Not  Given ,     05/15/23 1718 05/15/23 1717  ketorolac (TORADOL) injection 15 mg  Once        Route: Intravenous  Ordered Dose: 15 mg       Last MAR action: Given ,     05/15/23 1705 05/15/23 1704  enema (FLEET) enema 1 enema  Once        Route: Rectal  Ordered Dose: 1 enema       Last MAR action: Meds to Go - ED Use  Only ,     05/15/23 1705 05/15/23 1704  polyethylene glycol (GoLYTELY) solution 4,000 mL  Once        Route: Oral  Ordered Dose: 4,000 mL       Last MAR action: Meds to Go - ED Use Only ,     05/15/23 1654 05/15/23 1653  ondansetron (ZOFRAN) injection 4 mg  Once        Route: Intravenous  Ordered Dose: 4 mg       Last MAR action: Given ,     05/15/23 1650 05/15/23 1649  fentaNYL (PF) (SUBLIMAZE) injection 50 mcg  Once        Route: Intravenous  Ordered Dose: 50 mcg       Last MAR action: Given ,     05/15/23 1645 05/15/23 1645  iohexol (OMNIPAQUE) 350 MG/ML injection 100 mL  IMG once as needed        Route: Intravenous  Ordered Dose: 100 mL       Last MAR action: Imaging Agent Given ,     05/15/23 1441 05/15/23 1440  senna-docusate (PERICOLACE) 8.6-50 MG per tablet 1 tablet  Once        Route: Oral  Ordered Dose: 1 tablet       Last MAR action: Given ,     05/15/23 1441 05/15/23 1440  dicyclomine (BENTYL) capsule 10 mg  Once        Route: Oral  Ordered Dose: 10 mg       Last MAR action: Given ,             LABORATORY RESULTS    Ordered and independently interpreted AVAILABLE laboratory tests.   Results       Procedure Component Value Units Date/Time    Urinalysis with Reflex to Microscopic Exam and Culture [[id number]]  (Abnormal) Collected: 05/15/23 1824    Specimen: Urine, Clean Catch Updated: 05/15/23 1902     Urine Color Pink     Urine Clarity Turbid     Urine Specific Gravity 1.034     Urine pH 7.5     Urine Leukocyte Esterase Small     Urine Nitrite Negative     Urine Protein 200= 2+     Urine Glucose Negative     Urine  Ketones Trace mg/dL      Urine Urobilinogen Normal mg/dL      Urine Bilirubin Negative     Urine Blood Large     RBC, UA Too numerous to count /hpf      Urine WBC 0-5 /hpf      Urine Squamous Epithelial Cells 0-5 /hpf      Urine Yeast Moderate     Urine WBC Clumps Many /hpf     Urine [employer] Tube [[id number]] Collected: 05/15/23 1824    Specimen: Urine, Clean Catch Updated: 05/15/23 1828    Basic Metabolic Panel [[id number]]  (Abnormal) Collected: 05/15/23 1540    Specimen: Blood, Venous Updated: 05/15/23 1610     Glucose 107 mg/dL      BUN 19 mg/dL      Creatinine 1.8 mg/dL      Calcium 9.6 mg/dL      Sodium 086 mEq/L      Potassium 4.4 mEq/L      Chloride 102 mEq/L      CO2 25 mEq/L      Anion Gap 13.0     GFR 41.3 mL/min/1.73 m2     CBC with  Differential (Order) [[id number]]  (Abnormal) Collected: 05/15/23 1540    Specimen: Blood, Venous Updated: 05/15/23 1548    Narrative:      The following orders were created for panel order CBC with Differential (Order).  Procedure                               Abnormality         Status                     ---------                               -----------         ------                     CBC with Differential (C.Marland KitchenMarland Kitchen[[id number]]  Abnormal            Final result                 Please view results for these tests on the individual orders.    CBC with Differential (Component) [[id number]]  (Abnormal) Collected: 05/15/23 1540    Specimen: Blood, Venous Updated: 05/15/23 1548     WBC 9.07 x10 3/uL      Hemoglobin 14.6 g/dL      Hematocrit 08.6 %      Platelet Count 149 x10 3/uL      MPV 9.7 fL      RBC 4.73 x10 6/uL      MCV 90.7 fL      MCH 30.9 pg      MCHC 34.0 g/dL      RDW 13 %      nRBC % 0.0 /100 WBC      Absolute nRBC 0.00 x10 3/uL      Preliminary Absolute Neutrophil Count 7.37 x10 3/uL      Neutrophils % 81.2 %      Lymphocytes % 9.5 %      Monocytes % 8.2 %      Eosinophils % 0.6 %      Basophils % 0.2 %      Immature Granulocytes % 0.3 %      Absolute  Neutrophils 7.37 x10 3/uL      Absolute Lymphocytes 0.86 x10 3/uL      Absolute Monocytes 0.74 x10 3/uL      Absolute Eosinophils 0.05 x10 3/uL      Absolute Basophils 0.02 x10 3/uL      Absolute Immature Granulocytes 0.03 x10 3/uL               CRITICAL CARE/PROCEDURES    Procedures   DIAGNOSIS      Diagnosis:  Final diagnoses:   Constipation, unspecified constipation type   Ureteral stone       Disposition:  ED Disposition       ED Disposition   Discharge    Condition   --    Date/Time   Wed May 15, 2023  5:56 PM    Comment   [name] discharge to home/self care.    Condition at disposition: Stable                 Prescriptions:  Discharge Medication List as of 05/15/2023  7:04 PM        CONTINUE these medications  which have NOT CHANGED    Details   acetaminophen (TYLENOL) 500 MG tablet Take 2 tablets (1,000 mg) by mouth every 6 (six) hours as needed for Pain, Starting Fri 05/10/2023, E-Rx      ibuprofen (ADVIL) 800 MG tablet Take 1 tablet (800 mg) by mouth every 8 (eight) hours as needed for Pain, Starting Fri 05/10/2023, E-Rx      lidocaine (LIDODERM) 5 % Place 1 patch onto the skin every 24 hours Remove & Discard patch within 12 hours or as directed by MD, Starting Fri 05/10/2023, E-Rx      losartan (COZAAR) 25 MG tablet Take 1 tablet (25 mg) by mouth daily, Historical Med      methocarbamol (ROBAXIN) 750 MG tablet Take 1 tablet (750 mg) by mouth 3 (three) times daily as needed (for spasm), Starting Fri 05/10/2023, Normal      ondansetron (ZOFRAN-ODT) 8 MG disintegrating tablet Take 1 tablet (8 mg) by mouth every 8 (eight) hours as needed for Nausea, Starting Tue 05/14/2023, E-Rx      oxyCODONE (ROXICODONE) 5 MG immediate release tablet Take 1-2 tablets (5-10 mg) by mouth every 8 (eight) hours as needed for Pain, Starting Fri 05/10/2023, Until Fri 05/17/2023 at 2359, E-Rx      tamsulosin (FLOMAX) 0.4 MG Cap Take 1 capsule (0.4 mg) by mouth daily for 14 days Discontinue for lightheadedness, Starting Tue 05/14/2023,  Until Tue 05/28/2023, E-Rx                 This note was generated by the Epic EMR system/ Dragon speech recognition and may contain inherent errors or omissions not intended by the user. Grammatical errors, random word insertions, deletions and pronoun errors  are occasional consequences of this technology due to software limitations. Not all errors are caught or corrected. If there are questions or concerns about the content of this note or information contained within the body of this dictation they should be addressed directly with the author for clarification.           [1] History reviewed. No pertinent surgical history.  [2]   Social History  Socioeconomic History    Marital status: Married   Tobacco Use    Smoking status: Never    Smokeless tobacco: Never   Substance and Sexual Activity    Alcohol use: Never    Drug use: Never     Social Determinants of Health     Financial Resource Strain: Low Risk  (05/14/2023)    Overall Financial Resource Strain (CARDIA)     Difficulty of Paying Living Expenses: Not hard at all   Food Insecurity: Patient Declined (05/15/2023)    Hunger Vital Sign     Worried About Running Out of Food in the Last Year: Patient declined     Ran Out of Food in the Last Year: Patient declined   Transportation Needs: Patient Declined (05/15/2023)    PRAPARE - Therapist, art (Medical): Patient declined     Lack of Transportation (Non-Medical): Patient declined   Physical Activity: Insufficiently Active (05/14/2023)    Exercise Vital Sign     Days of Exercise per Week: 3 days     Minutes of Exercise per Session: 40 min   Stress: No Stress Concern Present (05/14/2023)    [employer] of Occupational Health - Occupational Stress Questionnaire     Feeling of Stress : Not at all   Social Connections: Moderately Isolated (05/14/2023)  Social Connection and Isolation Panel [NHANES]     Frequency of Communication with Friends and Family: More than three times a week      Frequency of Social Gatherings with Friends and Family: Three times a week     Attends Religious Services: Never     Active Member of Clubs or Organizations: No     Attends Banker Meetings: Never     Marital Status: Married   Catering manager Violence: Not At Risk (05/15/2023)    Humiliation, Afraid, Rape, and Kick questionnaire     Fear of Current or Ex-Partner: No     Emotionally Abused: No     Physically Abused: No     Sexually Abused: No   Housing Stability: Patient Declined (05/15/2023)    Housing Stability Vital Sign     Unable to Pay for Housing in the Last Year: Patient declined     Number of Times Moved in the Last Year: 1     Homeless in the Last Year: Patient declined   [3] No family history on file.       Hedy Camara, DO  05/15/23 1924

## 2023-05-15 NOTE — ED Triage Notes (Signed)
patient ambulatory to triage reports having "constipation and feeling bloated secondary to kidney stones", no bowel movement since thursday. seen here last friday due tokidney stones

## 2023-05-16 ENCOUNTER — Telehealth (INDEPENDENT_AMBULATORY_CARE_PROVIDER_SITE_OTHER): Payer: Self-pay

## 2023-05-16 LAB — LAB USE ONLY - URINE GRAY CULTURE HOLD TUBE

## 2023-05-16 NOTE — Telephone Encounter (Signed)
-----   Message from Ringo M sent at 05/16/2023 10:36 AM EDT -----  Regarding: Possibly passed his stone / surgery 10/16  Pre-surgical sent a message, Mr. Marcus Mcneil believes he passed his stone, he is on for Wednesday 10/16. Vernona Rieger can you reach out to him?

## 2023-05-16 NOTE — Telephone Encounter (Signed)
Patient called in. He is returning Laura's call. Patient had ER visit yesterday due to pain and constipation. He had done CT scan at the ER and it showed 3 mm stone in the right ureter. He thinks he passed the stone yesterday, urine was very bloody. Currently, he does not have any pain and constipation. Urine color is getting lighter. No fever/chills.   Patient would like to know if it is okay to cancel his 10/16 surgery and needs another CT to confirm if he has passed the stone or what is the next steps are. Told patient that we will notify Dr.Holt and let him know what he recommends. Pt verbalized understanding and had no further questions or concerns at this time.

## 2023-05-16 NOTE — Telephone Encounter (Signed)
Received Staff message from Dr Leonor Liv:   I gave him an order for follow up CT scan.  If he thinks he passed it and isn't having pain, can go ahead with imaging.   Thanks

## 2023-05-16 NOTE — Telephone Encounter (Signed)
Called and left voice message for patient to call back.  Calling to discuss his symptoms and see if he passed his stone possibly.

## 2023-05-16 NOTE — Telephone Encounter (Signed)
Returned call to pt, verified if he thought he felt stone pass after the CT that was done int he ER. Pt states he had the CT, then voided in ER and had blood in the urine whereas he hadn't had this before. Also thought he felt something pass through during void but didn't catch anything or see it in the toilet. He notes the volume of urine out was also more than he has been having. He no longer has pain so feels he may have passed it.     Informed him of recommendation from Dr Leonor Liv to get CT scan to verify if it passed or not. Provided him with ph #'s for radiology to call and schedule it. Pt asked if he needed to cancel the surgery and speak with Stefanie. Informed him Dr Leonor Liv would like to leave it scheduled as is for now while awaiting CT results. Pt stated he will call today to schedule the CT and had no further questions or concerns at this time.

## 2023-05-17 ENCOUNTER — Encounter (INDEPENDENT_AMBULATORY_CARE_PROVIDER_SITE_OTHER): Payer: Self-pay

## 2023-05-17 ENCOUNTER — Ambulatory Visit (INDEPENDENT_AMBULATORY_CARE_PROVIDER_SITE_OTHER): Payer: No Typology Code available for payment source

## 2023-05-17 NOTE — PSS Phone Screening (Signed)
Pre-Anesthesia Evaluation    Pre-op phone visit requested by:   Reason for pre-op phone visit: Patient anticipating CYSTOSCOPY, URETEROSCOPY, LASER LITHOTRIPSY, INSERTION URETERAL STENT RIGHT PELVIS procedure.    Language Assistant  Interpreter: N/A - English is preferred language    Pt has CT appt scheduled 10/12    Day of procedure contact: [facility] [phone] - follow your doctor's arrival time. Please park in the [employer].      History of Present Illness/Summary:    Problem List:  Medical Problems       Hospital Problem List  Date Reviewed: 05/14/2023   None        Non-Hospital Problem List  Date Reviewed: 05/14/2023            ICD-10-CM Priority Class Noted Diagnosed    Right ureteral stone N20.1   05/14/2023     Hydronephrosis, right N13.30   05/14/2023         Medical History   Diagnosis Date    Arthritis     cervical spine per pt    Cervical spondylosis 06/2021    "1.  Multilevel degenerative cervical spondylosis, with grossly mild to moderate canal stenosis. 2.  Multilevel neural foraminal narrowing, as delineated above. 3.  The cervical spinal cord is normal in caliber and signal intensity." - MRI report 06/2021 in imaging tab    Hypertension     Kidney stones     Lumbar disc herniation     per pt L3-L5    Right ureteral stone     pre-op dx for DOS 05/22/23    Sleep apnea     per pt last sleep study approx 2014, pt reports he uses CPAP nightly     Past Surgical History[1]     Medication List            Accurate as of May 17, 2023 10:37 AM. Always use your most recent med list.                acetaminophen 500 MG tablet  Take 2 tablets (1,000 mg) by mouth every 6 (six) hours as needed for Pain  Commonly known as: TYLENOL  Medication Adjustments for Surgery: Take as prescribed     ibuprofen 800 MG tablet  Take 1 tablet (800 mg) by mouth every 8 (eight) hours as needed for Pain  Commonly known as: ADVIL  Medication Adjustments for Surgery: Last dose 24 hours before surgery      ketorolac 10 MG tablet  Take 1 tablet (10 mg) by mouth every 6 (six) hours as needed for Pain  Commonly known as: TORADOL  Medication Adjustments for Surgery: Stop 2 days before surgery     lidocaine 5 %  Place 1 patch onto the skin every 24 hours Remove & Discard patch within 12 hours or as directed by MD  Commonly known as: LIDODERM  Medication Adjustments for Surgery: Hold day of surgery     losartan 25 MG tablet  Take 1 tablet (25 mg) by mouth daily  Commonly known as: COZAAR  Medication Adjustments for Surgery: Hold day of surgery     methocarbamol 750 MG tablet  Take 1 tablet (750 mg) by mouth 3 (three) times daily as needed (for spasm)  Commonly known as: ROBAXIN  Medication Adjustments for Surgery: Take as prescribed     ondansetron 8 MG disintegrating tablet  Take 1 tablet (8 mg) by mouth every 8 (eight) hours as needed for Nausea  Commonly known as: ZOFRAN-ODT  Medication Adjustments for Surgery: Take as prescribed     oxyCODONE 5 MG immediate release tablet  Take 1-2 tablets (5-10 mg) by mouth every 8 (eight) hours as needed for Pain  Commonly known as: ROXICODONE  Medication Adjustments for Surgery: Take as prescribed     tamsulosin 0.4 MG Caps  Take 1 capsule (0.4 mg) by mouth daily for 14 days Discontinue for lightheadedness  Commonly known as: FLOMAX  Medication Adjustments for Surgery: Take as prescribed     vitamins/minerals Tabs  Take 1 tablet by mouth daily  Medication Adjustments for Surgery: Hold day of surgery            Allergies[2]  Family History[3]  Social History     Occupational History    Not on file   Tobacco Use    Smoking status: Never    Smokeless tobacco: Never   Substance and Sexual Activity    Alcohol use: Never    Drug use: Never    Sexual activity: Not on file           Exam Scores:   SDB score  OSA Risk Category: OSA Diagnosed With PAP        STBUR score       PONV score  Nausea Risk: SEVERE RISK    MST score  MST Score: 0    PEN-FAST score       Frailty score       CHADsVasc             Visit Vitals  Ht 1.75 m (5' 8.9")   Wt 75.8 kg (167 lb)   BMI 24.73 kg/m        Recent Labs   CBC (last 180 days) 05/10/23  0324 05/15/23  1540   WBC 12.92* 9.07   RBC 4.69 4.73   Hemoglobin 14.5 14.6   Hematocrit 42.8 42.9   MCV 91.3 90.7   MCH 30.9 30.9   MCHC 33.9 34.0   RDW 13 13   Platelet Count 160 149   MPV 10.3 9.7   nRBC % 0.0 0.0   Absolute nRBC 0.00 0.00     Recent Labs   BMP (last 180 days) 05/10/23  0324 05/15/23  1540   Glucose 106* 107*   BUN 19 19   Creatinine 1.4 1.8*   Sodium 139 140   Potassium 4.6 4.4   Chloride 107 102   CO2 21 25   Calcium 9.5 9.6   Anion Gap 11.0 13.0   GFR 55.8* 41.3*         Recent Labs   Other (last 180 days) 05/10/23  0324   Bilirubin, Total 0.6   ALT 17   AST (SGOT) 25   Protein, Total 7.5                      [1]   Past Surgical History:  Procedure Laterality Date    COLONOSCOPY, SCREENING      approx 2019 per pt    WISDOM TOOTH EXTRACTION      late 1970s per pt   [2]   Allergies  Allergen Reactions    Cheese Headaches     Headaches for 3 days per pt   [3] No family history on file.

## 2023-05-18 ENCOUNTER — Ambulatory Visit
Admission: RE | Admit: 2023-05-18 | Discharge: 2023-05-18 | Disposition: A | Discharge status: Home / Self Care | Payer: Medicare Other | Source: Ambulatory Visit | Attending: Urology | Admitting: Urology

## 2023-05-18 DIAGNOSIS — N1330 Unspecified hydronephrosis: Secondary | ICD-10-CM | POA: Insufficient documentation

## 2023-05-18 DIAGNOSIS — N201 Calculus of ureter: Secondary | ICD-10-CM | POA: Insufficient documentation

## 2023-05-20 ENCOUNTER — Telehealth (INDEPENDENT_AMBULATORY_CARE_PROVIDER_SITE_OTHER): Payer: Self-pay

## 2023-05-20 NOTE — Telephone Encounter (Signed)
Pt called in to Nurse Triage Line. Left VM stating he had the CT scan done on Saturday checking for stones as he has surgery scheduled on Wednesday. Asking if we have the results back yet and if the surgery can be cancelled.

## 2023-05-20 NOTE — Telephone Encounter (Signed)
Dr Leonor Liv,   Report isn't back yet. But you may be able to see the images I think.  TY

## 2023-05-21 ENCOUNTER — Telehealth (INDEPENDENT_AMBULATORY_CARE_PROVIDER_SITE_OTHER): Payer: Self-pay

## 2023-05-21 NOTE — Telephone Encounter (Signed)
FYI  Spoke with patient. Informed patient of Dr.Holt's note. I was able to see the CT images.  It looks like his stone has passed.  Can cancel his surgery for tomorrow. Pt verbalized understanding and had no further questions or concerns at this time.

## 2023-05-21 NOTE — Telephone Encounter (Signed)
Patient called in. He is requesting CT scan result. He had done CT on Sat at [facility]. Patient is scheduled for a procedure tomorrow.   Per patient, he is symptom free, 99% he is sure that he passed the stone. So, he would like to know the results ASAP and cancel the procedure. Told patient that we have not received the result yet. We will ask Dr.Holt if he is able to see the results. Pt verbalized understanding and had no further questions or concerns at this time.

## 2023-05-21 NOTE — Telephone Encounter (Signed)
Had called earlier this afternoon to Radiology asking if they could expedite the review of pt's new CT scan results. Radiologist stated he would Guerin it as urgent so it would be read within the hour.     Report is now available on chart.

## 2023-05-22 ENCOUNTER — Encounter: Admission: RE | Payer: Self-pay | Source: Ambulatory Visit

## 2023-05-22 ENCOUNTER — Ambulatory Visit: Admission: RE | Admit: 2023-05-22 | Payer: Medicare Other | Source: Ambulatory Visit | Admitting: Urology

## 2023-05-22 ENCOUNTER — Encounter: Payer: Self-pay | Admitting: Cardiology

## 2023-05-22 SURGERY — CYSTOSCOPY, URETEROSCOPY, LASER LITHOTRIPSY, INSERTION URETERAL STENT
Anesthesia: General | Site: Pelvis | Laterality: Right

## 2023-05-22 NOTE — Telephone Encounter (Signed)
Pt sent 2 messages.

## 2023-05-23 ENCOUNTER — Inpatient Hospital Stay: Payer: Medicare Other | Attending: Physician Assistant

## 2023-05-23 DIAGNOSIS — M5459 Other low back pain: Secondary | ICD-10-CM | POA: Insufficient documentation

## 2023-05-23 NOTE — Progress Notes (Signed)
Name:Marcus Mcneil Age: 67 y.o.   Date of Service: 05/23/2023  Referring Physician: Alfredia Ferguson, PA   Date of Injury: No data found 05/10/2023  PT Date Care Plan Established/Reviewed:05/23/2023  PT Date Treatment Started:05/23/2023  OT Date Care Plan Established/Reviewed: No data found  OT Date Treatment Started: No data found    (Historic) Date of Injury:No data was found  (Historic) Date Care Plan Established/Reviewed No data was found  No data was found  (Historic) Date Treatment Started No data was found No data was found    End of Certification Date: 08/20/2023  Sessions in Plan of Care: 20  Surgery Date: No data was found  MD Follow-up: No data was found  Medbridge Code: No data was found    Visit Count: 1   Diagnosis:    Diagnosis ICD-10-CM Associated Order   1. 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 Treated with PT and injections which went away at the time. States he is now feeling that similar numbness again in the L foot now, had imaging which showed a kidney stone on 05/10/23. Passed the kidney stone on Wednesday last week, then had a CT scan 2 days ago which showed the stone had passed. Notes the numbness is on the outer side and top of the L foot without radiation upwards or downwards. Had a CT scan of the lumbar spine on 05/10/23 which showed multilevel spondylosis with no fractures or bony abnormalities.  Functional Limitations (PLOF): Difficulty walking further than 10 minutes (no pain or difficulty walking > 30 minutes)  Difficulty standing > 15 minutes due to numbness in L foot for cooking, grooming tasks (no difficulty or numbness standing > 30 minutes)  Difficulty and pain with bending down to lift groceries  from floor (independent, no pain with lifting from floor)    Outcome Measure   Tool Used/Details: FOTO  Score: 76  Predicted Functional Outcome: 85    Pain   Current pain rating: 0  At best pain rating: 0  At worst pain rating: 3  Location: Low back    Social Support/Occupation    Lives in: multiple level home    Lives with: spouse    Occupation: Education officer, environmental.           Precautions: No data was found  Allergies: Allergies[1]    Medical History[2]    Objective                       (05/23/2023)  Observation of posture: Deficits noted: Forward Head and Rounded Shoulders  Ambulation: without AD  Gait: decreased hip extension bilateral  Integumentary: No wound, lesion or rash noted    Functional Strength:    (05/23/2023)   Sit to Stand: UE Assist   Squat:   Partial   Step-up: 6 inches   (blank fields were intentionally left blank)    Balance (secs): Initial R Initial L   SLS (eyes open) 30+ sec 30+ sec   Tandem          (blank fields were intentionally left blank)    Hip ROM supine Initial  Right   Initial Left   Flexion 110 105   Abduction     IR Passive 13 Passive 12   ER Passive 42 Passive 38   (blank fields were intentionally left blank)    Lumbar AROM standing Initial    Flexion 30 cm to floor      Extension   11 deg       R L     Side Bending 14 15            (blank fields were intentionally left blank)    Knee AROM: WFL    Flexibility:  (Degrees) Initial Right   Initial Left   Hamstrings (SLR) 55 52 numbness in L foot   Quadriceps  prone     ITBand     Iliospoas     (blank fields were intentionally left blank)      Initial   R Initial   L LE Strength  MMT __/5       5 4  Hip Flexion     5 4 Quadriceps     5 4 Hamstrings     5 4 Hip IR     4 4 Hip ER     4- 4- Hip Abduction       Hip Adduction     4- 4- Hip Extension     5 5 DF     (blank fields were intentionally left blank)    Palpation: No pain to palpation  Joint Mobility Assessment: hypomobile L1-L5 Levels      End feel: Firm    SI Special Tests: (-)    Special  Tests: Eval R Eval L   grind (-) (-)   Slump sitting (-) (-)   FABER/OBER/THOMAS/ Quadrant test (-) (-)   Repeated extension (-)    Repeated flexion (-)         (blank fields were intentionally left blank)    Neurological Screen: Initial R Initial L   Light touch Intact Intact   (blank fields were intentionally left blank)      Treatment     Therapeutic Exercises - Justified to address any of the following:  To develop strength, endurance, ROM and/or flexibility.   Education on diagnosis/pathology and role of PT.  Discussed how exercises will address impairments found at evaluation and performed the below HEP:    Exercises  - Supine 90/90 Sciatic Nerve Glide with Knee Flexion/Extension  - 1 x daily - 7 x weekly - 3 sets - 10 reps  - Standing Distal Sciatic Nerve Mobilization on Step  - 1 x daily - 7 x weekly - 2 sets - 10 reps  - Supine Piriformis Stretch with Foot on Ground  - 1 x daily - 7 x weekly - 3 sets - 30 second hold    Manual Therapy - Justified to address any of the following:    Mobilization of joints and soft tissues, manipulation, manual lymphatic drainage, and/or manual traction.    Prone with pillows under hips:  -STM to B lumbar paraspinals    ---      Flowsheet Row ---   Total Time    Timed Minutes 23 minutes   Untimed Minutes 20 minutes   Total Time 43 minutes             Assessment   Marcus Mcneil is a 67 y.o. male presenting with low back pain and L foot numbness who requires skilled Physical Therapy services.  Clinical presentation: stable - local pain without causing other issues/symptoms  Barriers to therapy: Comorbidities - history of symptoms with multiple episodes    Impairments: Pain that limits and interferes with functional ability  Impaired postural alignment  Decreased range of motion  Decreased strength  Decreased joint mobility  Decreased soft tissue mobility  Neural tension  Functional Limitations (PLOF): Difficulty walking further than 10 minutes (no pain or difficulty walking > 30  minutes)  Difficulty standing > 15 minutes due to numbness in L foot for cooking, grooming tasks (no difficulty or numbness standing > 30 minutes)  Difficulty and pain with bending down to lift groceries from floor (independent, no pain with lifting from floor)  Prognosis: good  Patient is aware of diagnosis, prognosis and consents to plan of care: Yes  Plan   Visits per week: 2  Number of Sessions: 20  Direct One on One  16109: Therapeutic Exercise: To Develop Strength and Endurance, ROM and Flexibility  [id number]: Gait Training  60454: Neuromuscular Reeducation (Proprioceptive Neuromuscular Facilitation)  97140: Manual Therapy techniques (mobilization, manipulation, manual traction) (Grade I-V to lumbar spine, pelvic girdle, and regionally interdependent joints, soft tissue mobilization, instrument assisted soft tissue mobilization.)  97530: Therapeutic Activities: Dynamic activities to improve functional performance  Dry Needling  Plan for Next Session -: review HEP      Goals      Goal 1: Patient will demonstrate independence in prescribed HEP with proper form, sets and reps for safe discharge to an independent program.     Sessions: 20      Goal 2: STG:    Patient will demonstrate improvements in hamstring length to 70 degrees using straight leg raise allow patient to stand with appropriate posture and weight distribution for 30 minutes to allow patient to perform grooming tasks     Sessions: 7      Goal 3: Patient will demonstrate lumbopelvic flexion AROM (fingertip to floor) to 10 cm to allow pt to facilitate lifting groceries from the floor without pain or numbness     Sessions: 20      Goal 4: Patient will demonstrate 5/5 MMT hip abduction strength to facilitate normalized gait pattern without hip drop or knee valgus for pain free ambulation for 30 minutes to perform community ambulation without pain or numbness       Sessions: 20                                  Si Gaul, DPT      Parts of this note  were generated by the Epic EMR system/Dragon speech recognition and may contain inherent errors or omissions not intended by the user. Grammatical errors, random word insertions, deletions, pronoun errors and incomplete sentences are occasional consequences of this technology due to software limitations. Not all errors are caught or corrected.  If there are questions or concerns about the content of this note or information contained within the body of this dictation they should be addressed directly with the author for clarification.       [1]   Allergies  Allergen Reactions    Cheese Headaches     Headaches for 3 days per pt   [2]   Past Medical History:  Diagnosis Date    Arthritis     cervical spine per pt    Cervical spondylosis 06/2021    "1.  Multilevel degenerative cervical spondylosis, with grossly  mild to moderate canal stenosis. 2.  Multilevel neural foraminal narrowing, as delineated above. 3.  The cervical spinal cord is normal in caliber and signal intensity." - MRI report 06/2021 in imaging tab    Hypertension     Kidney stones     Lumbar disc herniation     per pt L3-L5    Right ureteral stone     pre-op dx for DOS 05/22/23    Sleep apnea     per pt last sleep study approx 2014, pt reports he uses CPAP nightly

## 2023-05-23 NOTE — PT/OT Therapy Note (Signed)
Name: Marcus Mcneil Age: 67 y.o.   Date of Service: 05/23/2023  Referring Physician: Alfredia Ferguson, PA   Date of Injury: No data found 05/10/2023  PT Date Care Plan Established/Reviewed:05/23/2023  PT Date Treatment Started:05/23/2023  OT Date Care Plan Established/Reviewed: No data found  OT Date Treatment Started: No data found    (Historic) Date of Injury:No data was found  (Historic) Date Care Plan Established/Reviewed No data was found  No data was found  (Historic) Date Treatment Started No data was found No data was found    End of Certification Date: 08/20/2023  Sessions in Plan of Care: 20  Surgery Date: No data was found  MD Follow-up: No data was found  Medbridge Code: No data was found    Visit Count: 1   Diagnosis:    Diagnosis ICD-10-CM Associated Order   1. Other low back pain  M54.59                Subjective     Social Support/Occupation    Lives in: multiple level home    Lives with: spouse    Occupation: Education officer, environmental.           Precautions: No data was found  Allergies: Cheese    Objective                       Information below copied from evaluation as reference information unless noted/dated on the goal grid:    Lumbar AROM standing Initial     Flexion 30 cm to floor         Extension    11 deg           R L       Side Bending 14 15                   (blank fields were intentionally left blank)     Flexibility:  (Degrees) Initial Right    Initial Left   Hamstrings (SLR) 55 52 numbness in L foot   Quadriceps  prone       ITBand       Iliospoas       (blank fields were intentionally left blank)     Initial   R Initial   L LE Strength  MMT __/5          5 4  Hip Flexion       5 4 Quadriceps       5 4 Hamstrings       5 4 Hip IR       4 4 Hip ER       4- 4- Hip Abduction           Hip Adduction       4- 4- Hip Extension       5 5 DF       (blank fields were intentionally left blank)          ---      Flowsheet Row ---   Total Time    Timed Minutes 23 minutes   Untimed Minutes 20 minutes   Total Time 43  minutes          Assessment   Prepping second visit note.   All pertinent information for this visit is in the progress note tab.    Goals      Goal 1: Patient will demonstrate independence in prescribed HEP with proper form,  sets and reps for safe discharge to an independent program.     Sessions: 20      Goal 2: STG:    Patient will demonstrate improvements in hamstring length to 70 degrees using straight leg raise allow patient to stand with appropriate posture and weight distribution for 30 minutes to allow patient to perform grooming tasks     Sessions: 7      Goal 3: Patient will demonstrate lumbopelvic flexion AROM (fingertip to floor) to 10 cm to allow pt to facilitate lifting groceries from the floor without pain or numbness     Sessions: 20      Goal 4: Patient will demonstrate 5/5 MMT hip abduction strength to facilitate normalized gait pattern without hip drop or knee valgus for pain free ambulation for 30 minutes to perform community ambulation without pain or numbness       Sessions: 20                                  Si Gaul, DPT       Exercise Flow Sheet    Precautions:        MD Follow-up:    Needs POC signed in 30 days from eval 10/17/2024or UPOC (Medicare or UHC advantage)       Exercise Specifics   Visit #2   Visit #3   Visit #4   Visit #5   Visit #6   Visit #7   Visit #8   Visit #9   Visit #10   RB            PN / FOTO   PPT               Clams               Bridge               Sciatic glides                                                                                                         Home Exercise Program               (all exercises were completed with therapist supervision unless noted)    (White date box = 2 week treatment reminder)    Patient HEP:  Access Code: [email or web address]  URL: [email or web address]  Date: 05/23/2023  Prepared by: Marlene Bast Marisabel Macpherson    Exercises  - Supine 90/90 Sciatic Nerve Glide with Knee Flexion/Extension  - 1 x daily - 7 x weekly - 2 sets - 10  reps  - Standing Distal Sciatic Nerve Mobilization on Step  - 1 x daily - 7 x weekly - 2 sets - 10 reps  - Supine Piriformis Stretch with Foot on Ground  - 1 x daily - 7 x weekly - 3 sets - 30 second hold    Parts of this note were generated by the Epic EMR system/Dragon speech recognition and may contain inherent  errors or omissions not intended by the user. Grammatical errors, random word insertions, deletions, pronoun errors and incomplete sentences are occasional consequences of this technology due to software limitations. Not all errors are caught or corrected.  If there are questions or concerns about the content of this note or information contained within the body of this dictation they should be addressed directly with the author for clarification.

## 2023-05-23 NOTE — Telephone Encounter (Signed)
Pt aware I will get DCCV cancelled for tomorrow. He appreciates my call and update.

## 2023-05-24 ENCOUNTER — Encounter (HOSPITAL_COMMUNITY): Payer: Self-pay

## 2023-05-24 ENCOUNTER — Ambulatory Visit (HOSPITAL_COMMUNITY): Admit: 2023-05-24 | Payer: BC Managed Care – PPO | Admitting: Cardiovascular Disease

## 2023-05-24 DIAGNOSIS — I4819 Other persistent atrial fibrillation: Secondary | ICD-10-CM

## 2023-05-24 SURGERY — CARDIOVERSION
Anesthesia: General

## 2023-05-28 ENCOUNTER — Other Ambulatory Visit: Payer: Self-pay

## 2023-05-28 DIAGNOSIS — I4819 Other persistent atrial fibrillation: Secondary | ICD-10-CM

## 2023-05-28 MED ORDER — DILTIAZEM HCL ER COATED BEADS 240 MG PO CP24: ORAL_CAPSULE | ORAL | 3 refills | Status: DC

## 2023-05-30 ENCOUNTER — Inpatient Hospital Stay: Payer: Medicare Other

## 2023-05-30 DIAGNOSIS — M5459 Other low back pain: Secondary | ICD-10-CM

## 2023-05-30 NOTE — PT/OT Therapy Note (Signed)
Name: Marcus Mcneil Age: 67 y.o.   Date of Service: 05/30/2023  Referring Physician: Alfredia Ferguson, PA   Date of Injury: No data found 05/10/2023  PT Date Care Plan Established/Reviewed:05/23/2023  PT Date Treatment Started:05/23/2023  OT Date Care Plan Established/Reviewed: No data found  OT Date Treatment Started: No data found    (Historic) Date of Injury:No data was found  (Historic) Date Care Plan Established/Reviewed No data was found  No data was found  (Historic) Date Treatment Started No data was found No data was found    End of Certification Date: 08/20/2023  Sessions in Plan of Care: 20  Surgery Date: No data was found  MD Follow-up: No data was found  Medbridge Code: No data was found    Visit Count: 2   Diagnosis:    Diagnosis ICD-10-CM Associated Order   1. Other low back pain  M54.59                  Subjective     Daily Subjective   Pt reports he went to the gym this morning and is feeling increased levels of numbness in the L foot. Has not radiated upwards but is unsure if he did something there to exacerbate the symptoms.    Social Support/Occupation    Lives in: multiple level home    Lives with: spouse    Occupation: Education officer, environmental.           Precautions: No data was found  Allergies: Cheese    Objective                       Information below copied from evaluation as reference information unless noted/dated on the goal grid:    Lumbar AROM standing Initial     Flexion 30 cm to floor         Extension    11 deg           R L       Side Bending 14 15                   (blank fields were intentionally left blank)     Flexibility:  (Degrees) Initial Right    Initial Left   Hamstrings (SLR) 55 52 numbness in L foot   Quadriceps  prone       ITBand       Iliospoas       (blank fields were intentionally left blank)     Initial   R Initial   L LE Strength  MMT __/5          5 4  Hip Flexion       5 4 Quadriceps       5 4 Hamstrings       5 4 Hip IR       4 4 Hip ER       4- 4- Hip Abduction           Hip  Adduction       4- 4- Hip Extension       5 5 DF       (blank fields were intentionally left blank)    Treatment     Therapeutic Exercises - Justified to address any of the following:  To develop strength, endurance, ROM and/or flexibility.   Active warm-up on RB to promote blood flow and increase tissue extensibility while subjective history obtained to guide treatment  session today.     See flowsheet for parameters: vc for setup and sequence  - prone quad stretch with strap  - Sidelying clamshells for hip ER, glute med strength  - Bridges in supine for glute strength  - Supine and standing sciatic nerve glides    Reviewed and updated HEP    Neuromuscular Re-Education - Justified to address any of the following:   Re-education of movement, balance, coordination, kinesthetic sense, posture and/or proprioception for sitting and/or standing activities.   - Posterior pelvic tilt in supine for TrA activation    Manual Therapy - Justified to address any of the following:    Mobilization of joints and soft tissues, manipulation, manual lymphatic drainage, and/or manual traction.    Prone:  - Lumbar spine central PA mobs: gr I-II       ---      Flowsheet Row ---   Total Time    Timed Minutes 39 minutes   Total Time 39 minutes            Assessment   Pt presents to first f/u for LBP and L foot numbness. Hypomobility noted with lumbar spine PA mobilizations however no inc pain or N/T increased during manual tx. Inc pelvic rocking with clamshells bilaterally but improved with cues. No change in numbness sensation during or after sciatic nerve glides with HS and gastroc stretch noted bilaterally. Dec numbness following prone quad stretch, updated HEP accordingly.  Plan   Cont per POC  F/u on response to treatment      Goals      Goal 1: Patient will demonstrate independence in prescribed HEP with proper form, sets and reps for safe discharge to an independent program.     Sessions: 20      Goal 2: STG:    Patient will  demonstrate improvements in hamstring length to 70 degrees using straight leg raise allow patient to stand with appropriate posture and weight distribution for 30 minutes to allow patient to perform grooming tasks     Sessions: 7      Goal 3: Patient will demonstrate lumbopelvic flexion AROM (fingertip to floor) to 10 cm to allow pt to facilitate lifting groceries from the floor without pain or numbness     Sessions: 20      Goal 4: Patient will demonstrate 5/5 MMT hip abduction strength to facilitate normalized gait pattern without hip drop or knee valgus for pain free ambulation for 30 minutes to perform community ambulation without pain or numbness       Sessions: 20                                    Si Gaul, DPT       Exercise Flow Sheet    Precautions:        MD Follow-up:    Needs POC signed in 30 days from eval 10/17/2024or UPOC (Medicare or U[employer] advantage)       Exercise Specifics   Visit #2   Visit #3   Visit #4   Visit #5   Visit #6   Visit #7   Visit #8   Visit #9   Visit #10   RB    5'        PN / FOTO   PPT    10x10"           Clams  3x10           Bridge    3x10           Sciatic glides    2x10 supine and standing           Prone quad stretch    3x30" bil                                                                                      Home Exercise Program    Reviewed and updated           (all exercises were completed with therapist supervision unless noted)    (White date box = 2 week treatment reminder)    Patient HEP:  Access Code: [email or web address]  URL: [email or web address]  Date: 05/30/2023  Prepared by: Marlene Bast Brenly Trawick    Exercises  - Supine 90/90 Sciatic Nerve Glide with Knee Flexion/Extension  - 1 x daily - 7 x weekly - 2 sets - 10 reps  - Standing Distal Sciatic Nerve Mobilization on Step  - 1 x daily - 7 x weekly - 2 sets - 10 reps  - Supine Piriformis Stretch with Foot on Ground  - 1 x daily - 7 x weekly - 3 sets - 30 second hold  - Prone Quadriceps Stretch with Strap  - 1  x daily - 7 x weekly - 3 sets - 30 second hold    Parts of this note were generated by the Epic EMR system/Dragon speech recognition and may contain inherent errors or omissions not intended by the user. Grammatical errors, random word insertions, deletions, pronoun errors and incomplete sentences are occasional consequences of this technology due to software limitations. Not all errors are caught or corrected.  If there are questions or concerns about the content of this note or information contained within the body of this dictation they should be addressed directly with the author for clarification.

## 2023-06-05 ENCOUNTER — Inpatient Hospital Stay: Payer: Medicare Other

## 2023-06-05 DIAGNOSIS — M5459 Other low back pain: Secondary | ICD-10-CM

## 2023-06-05 NOTE — PT/OT Therapy Note (Signed)
 Name: BRAYLYNN GHAN Age: 67 y.o.   Date of Service: 06/05/2023  Referring Physician: Alfredia Ferguson, PA   Date of Injury: No data found 05/10/2023  PT Date Care Plan Established/Reviewed:05/23/2023  PT Date Treatment Started:05/23/2023  OT Date Care Plan Es

## 2023-06-09 NOTE — PT/OT Therapy Note (Signed)
 Name: AMADU SCHLAGETER Age: 67 y.o.   Date of Service: 06/10/2023  Referring Physician: Alfredia Ferguson, PA   Date of Injury: No data found 05/10/2023  PT Date Care Plan Established/Reviewed:05/23/2023  PT Date Treatment Started:05/23/2023  OT Date Care Plan Est

## 2023-06-10 ENCOUNTER — Inpatient Hospital Stay: Payer: Medicare Other | Attending: Physician Assistant

## 2023-06-10 DIAGNOSIS — M5459 Other low back pain: Secondary | ICD-10-CM | POA: Insufficient documentation

## 2023-06-17 ENCOUNTER — Inpatient Hospital Stay: Payer: Medicare Other | Attending: Physician Assistant

## 2023-06-17 ENCOUNTER — Other Ambulatory Visit: Payer: Self-pay

## 2023-06-17 ENCOUNTER — Encounter: Payer: Self-pay | Admitting: Family Medicine

## 2023-06-17 DIAGNOSIS — M5459 Other low back pain: Secondary | ICD-10-CM

## 2023-06-17 MED ORDER — RIZATRIPTAN BENZOATE 10 MG PO TBDP: ORAL_TABLET | ORAL | 2 refills | Status: DC

## 2023-06-17 NOTE — PT/OT Therapy Note (Signed)
 Name: [name] Age: 67 y.o.   Date of Service: 06/17/2023  Referring Physician: Alfredia Ferguson, PA   Date of Injury: No data found 05/10/2023  PT Date Care Plan Established/Reviewed:05/23/2023  PT Date Treatment Started:05/23/2023  OT Date Care Plan Es

## 2023-06-20 DIAGNOSIS — H029 Unspecified disorder of eyelid: Secondary | ICD-10-CM | POA: Insufficient documentation

## 2023-06-27 ENCOUNTER — Inpatient Hospital Stay: Payer: Medicare Other

## 2023-06-27 DIAGNOSIS — M5459 Other low back pain: Secondary | ICD-10-CM

## 2023-06-27 NOTE — PT/OT Therapy Note (Signed)
 Name: Marcus Mcneil Age: 67 y.o.   Date of Service: 06/27/2023  Referring Physician: Alfredia Ferguson, PA   Date of Injury: No data found 05/10/2023  PT Date Care Plan Established/Reviewed:05/23/2023  PT Date Treatment Started:05/23/2023  OT Date Care Plan Es

## 2023-07-01 ENCOUNTER — Ambulatory Visit (HOSPITAL_COMMUNITY): Payer: BC Managed Care – PPO | Admitting: Physician Assistant

## 2023-07-01 ENCOUNTER — Encounter (HOSPITAL_COMMUNITY): Payer: Self-pay

## 2023-07-03 ENCOUNTER — Inpatient Hospital Stay: Payer: Medicare Other

## 2023-07-03 ENCOUNTER — Encounter: Payer: Self-pay | Admitting: Family Medicine

## 2023-07-03 DIAGNOSIS — M5459 Other low back pain: Secondary | ICD-10-CM

## 2023-07-03 NOTE — PT/OT Therapy Note (Signed)
Name: Marcus Mcneil Age: 67 y.o.   Date of Service: 07/03/2023  Referring Physician: Alfredia Ferguson, PA   Date of Injury: No data found 05/10/2023  PT Date Care Plan Established/Reviewed:05/23/2023  PT Date Treatment Started:05/23/2023  OT Date Care Plan Established/Reviewed: No data found  OT Date Treatment Started: No data found    (Historic) Date of Injury:No data was found  (Historic) Date Care Plan Established/Reviewed No data was found  No data was found  (Historic) Date Treatment Started No data was found No data was found    End of Certification Date: 08/20/2023  Sessions in Plan of Care: 20  Surgery Date: No data was found  MD Follow-up: No data was found  Medbridge Code: No data was found    Visit Count: 7   Diagnosis:    Diagnosis ICD-10-CM Associated Order   1. Other low back pain  M54.59                          Subjective     Outcome Measure   Tool Used/Details: FOTO  Score: 83  Predicted Functional Outcome: 81    Daily Subjective   Patient reports he tried the calf stretch and that increased the numbness, then it decreased and went away. States tried the exercise again and it didn't help. Has been only trying a few exercises vs all given. States that he would like to make today the last day, and will have his appt with the surgeon to discuss next steps.     Social Support/Occupation    Lives in: multiple level home    Lives with: spouse    Occupation: Education officer, environmental.           Precautions: No data was found  Allergies: Cheese    Objective                       Information below copied from evaluation as reference information unless noted/dated on the goal grid:    Lumbar AROM standing Initial     Flexion 30 cm to floor 06/10/23     07/03/23  28 cm      Extension    11 deg 13   18        R L       Side Bending 14 15                   (blank fields were intentionally left blank)     Flexibility:  (Degrees) Initial Right    Initial Left 11/27/24R/ L   Hamstrings (SLR) 55 52 numbness in L foot 70/60     Quadriceps  prone        ITBand        Iliospoas        (blank fields were intentionally left blank)     Initial   R Initial   L LE Strength  MMT __/5 07/03/23  L  07/03/23  R       5 4 Hip Flexion  4+     5 4 Quadriceps       5 4 Hamstrings  4     5 4  Hip IR 4      4 4  Hip ER 4  4+    4- 4- Hip Abduction 4  4+       Hip Adduction       4- 4- Hip Extension  4 4    5 5  DF       (blank fields were intentionally left blank)    Treatment     Therapeutic Exercises - Justified to address any of the following:  To develop strength, endurance, ROM and/or flexibility.   Active warm-up on RB to promote blood flow and increase tissue extensibility while subjective history obtained to guide treatment session today.     See flowsheet for parameters: vc for setup and sequence  - modified thomas stretch with strap  - lumbar rot cross body stretch in supine   + reverse clamshells for hip IR    Time taken to assess obj measures, FOTO and update goals for discharge note.     Reviewed full HEP program with education/instruction on how to progress exercises (frequency/duration) with sets, reps, hold times, and resistance to promote continued improvement for low back flexibility/strength/balance.  Special attention given to instruct patient to not push HEP to the point of pain and to contact our office if questions occur. Advised to follow-up with MD as prescribed/as needed.  Corrections/Modifications made supine.    Handout provided to patient with above information and phone numbers.    Discussion of returning to PT if recommended by surgeon, and continuing modifications of exercise to see if pt has relief.       Neuromuscular Re-Education - Justified to address any of the following:   Re-education of movement, balance, coordination, kinesthetic sense, posture and/or proprioception for sitting and/or standing activities.   - Prone hip extension with TC/VC to dec excess pelvic rot and improve glute max activation, progressed to 2#  ankle weight.     - SS with YTB abv ank for inc glute med activation.     Manual Therapy - Justified to address any of the following:    Mobilization of joints and soft tissues, manipulation, manual lymphatic drainage, and/or manual traction.    Prone:  - Lumbar spine central PA mobs: gr II-III         ---      Flowsheet Row ---   Total Time    Timed Minutes 40 minutes   Total Time 40 minutes                      Assessment   Pt presents to 6th f/u for LBP and L foot numbness. Patient has made fluctuating progress, however increase in functional outcome score due to pt answering questions incorrectly. Patient has shown improvement with his LE strength, and HS flexibility, still more limited on LLE vs RLE . Pt has been instructed in comprehensive HEP and demonstrates understanding for self maintenance. Pt has been advised to continue HEP and follow up with PT or MD if any further issues arise. Patient will follow up with his doctor for the next steps. Mr. York has not met his therapy goals, and is no longer in need of skilled PT services d/t following up with the doctor from no major relief. They will be discharged at this time.     Plan   Patient will be discharged with an independent HEP program and progressions to continue outside of physical therapy.         Goals      Goal 1: Patient will demonstrate independence in prescribed HEP with proper form, sets and reps for safe discharge to an independent program.    11/21: compliant with HEP and will benefit from progressions in hip strengthening and mobility. -  MD   Sessions: 20      Goal 2: STG:    Patient will demonstrate improvements in hamstring length to 70 degrees using straight leg raise allow patient to stand with appropriate posture and weight distribution for 30 minutes to allow patient to perform grooming tasks    07/03/23 not met, still pain with grooming activities will be following up with the doctor  - [facility]   Sessions: 7      Goal 3: Patient will  demonstrate lumbopelvic flexion AROM (fingertip to floor) to 10 cm to allow pt to facilitate lifting groceries from the floor without pain or numbness    07/03/23:  not met, still limited, difficulty with lifting groceries , still numbness - IH   Sessions: 20      Goal 4: Patient will demonstrate 5/5 MMT hip abduction strength to facilitate normalized gait pattern without hip drop or knee valgus for pain free ambulation for 30 minutes to perform community ambulation without pain or numbness    07/03/23: not met, still having numbness with ambulation , improved hip abduction strength still weaker than R- IH   Sessions: 20                                            Biff Rutigliano J Emiliya Chretien, DPT       Exercise Flow Sheet    Precautions:        MD Follow-up:  first week of Dec  Needs POC signed in 30 days from eval 10/17/2024or UPOC (Medicare or [employer] advantage)       Exercise Specifics   Visit #2   Visit #3   Visit #4   Visit #5   Visit #6   Visit #7   Visit #8   Visit #9   Visit #10   RB    5' 5' 6' 5' 5' 5'   PN / FOTO   PPT    10x10" 20x    PPT heel taps 2x10 green PB   PPT heel taps 2x10 green PB x15 x15 --      Clams    3x10 3x10 RTB x30 RTB 3x30" ea Reverse clams 2x10 bil Reverse clams 2x10 bil      Bridge    3x10 3x10 RTB 3x10 RTB 2x30" 2x30"       Sciatic glides    2x10 supine and standing x10 sciatic and peroneal   supine Time   -       Prone quad stretch    3x30" bil Mod thomas stretch 3x30" bil Mod thomas stretch   2x30" bil time Mod thomas stretch 3x30" bil Mod thomas stretch 3x30" bil           Prone press up 10x Prone press up 2x10 Time  -             Prone hip ext   2x10 Prone hip ext   3x10 Prone hip ext   3x10 Prone hip ext 2#  3x10            Femoral n glide  2x8 Femoral n glide   2x10  -              SKTC  2x30"    Lumbar rot stretch   X30" ea   SKTC  2x30"    Lumbar rot stretch   X30" ea  SKTC  2x30"    Lumbar rot stretch   X30" ea             SS YTB ank  2 laps SS YTB ank  2 laps SS YTB ank  2 laps      Home  Exercise Program    Reviewed and updated Updated Updated    Reviewed       (all exercises were completed with therapist supervision unless noted)    (White date box = 2 week treatment reminder)    Patient HEP: Advised to do either prone quad or mod thomas, not both  Access Code: [email or web address]  URL: [email or web address]  Date: 06/17/2023  Prepared by: Ernst Spell    Exercises  - Supine 90/90 Sciatic Nerve Glide with Knee Flexion/Extension  - 1 x daily - 7 x weekly - 2 sets - 10 reps  - Standing Distal Sciatic Nerve Mobilization on Step  - 1 x daily - 7 x weekly - 2 sets - 10 reps  - Supine Piriformis Stretch with Foot on Ground  - 1 x daily - 7 x weekly - 3 sets - 30 second hold  - Prone Quadriceps Stretch with Strap  - 1 x daily - 7 x weekly - 3 sets - 30 second hold  - Hip Flexor Stretch with Strap on Table  - 1 x daily - 7 x weekly - 3 sets - 30 second hold  - Supine Posterior Pelvic Tilt  - 1 x daily - 7 x weekly - 2 sets - 10 reps  - Prone Femoral Nerve Mobilization  - 1 x daily - 7 x weekly - 2 sets - 10 reps  - Prone Hip Extension with Pillow Under Abdomen  - 1 x daily - 7 x weekly - 3 sets - 10 reps  - Standing Lumbar Extension  - 1 x daily - 7 x weekly - 2 sets - 10 reps    Parts of this note were generated by the Epic EMR system/Dragon speech recognition and may contain inherent errors or omissions not intended by the user. Grammatical errors, random word insertions, deletions, pronoun errors and incomplete sentences are occasional consequences of this technology due to software limitations. Not all errors are caught or corrected.  If there are questions or concerns about the content of this note or information contained within the body of this dictation they should be addressed directly with the author for clarification.

## 2023-07-03 NOTE — Progress Notes (Signed)
Name:Marcus Mcneil Age: 67 y.o.   Date of Service: 07/03/2023  Referring Physician: Alfredia Ferguson, PA   Date of Injury: No data found 05/10/2023  PT Date Care Plan Established/Reviewed:05/23/2023  PT Date Treatment Started:05/23/2023  OT Date Care Plan Established/Reviewed: No data found  OT Date Treatment Started: No data found    (Historic) Date of Injury:No data was found  (Historic) Date Care Plan Established/Reviewed No data was found  No data was found  (Historic) Date Treatment Started No data was found No data was found    End of Certification Date: 08/20/2023  Sessions in Plan of Care: 20  Surgery Date: No data was found  MD Follow-up: No data was found  Medbridge Code: No data was found    Visit Count: 7   Diagnosis:    Diagnosis ICD-10-CM Associated Order   1. Other low back pain  M54.59           Subjective     Social Support/Occupation    Lives in: multiple level home    Lives with: spouse    Occupation: Education officer, environmental.             Objective                            Subjective      Outcome Measure   Tool Used/Details: FOTO  Score: 83  Predicted Functional Outcome: 81     Daily Subjective   Patient reports he tried the calf stretch and that increased the numbness, then it decreased and went away. States tried the exercise again and it didn't help. Has been only trying a few exercises vs all given. States that he would like to make today the last day, and will have his appt with the surgeon to discuss next steps.        Objective                Information below copied from evaluation as reference information unless noted/dated on the goal grid:            Lumbar AROM standing Initial     Flexion 30 cm to floor 06/10/23     07/03/23  28 cm      Extension    11 deg 13   18        R L       Side Bending 14 15                   (blank fields were intentionally left blank)     Flexibility:  (Degrees) Initial Right    Initial Left 11/27/24R/ L   Hamstrings (SLR) 55 52 numbness in L foot 70/60    Quadriceps  prone          ITBand         Iliospoas         (blank fields were intentionally left blank)     Initial   R Initial   L LE Strength  MMT __/5 07/03/23  L  07/03/23  R       5 4 Hip Flexion  4+     5 4 Quadriceps       5 4 Hamstrings  4     5 4  Hip IR 4      4 4  Hip ER 4  4+    4- 4- Hip Abduction 4  4+  Hip Adduction       4- 4- Hip Extension  4 4    5 5  DF       (blank fields were intentionally left blank)                Assessment   Pt presents to 6th f/u for LBP and L foot numbness. Patient has made fluctuating progress, however increase in functional outcome score due to pt answering questions incorrectly. Patient has shown improvement with his LE strength, and HS flexibility, still more limited on LLE vs RLE . Pt has been instructed in comprehensive HEP and demonstrates understanding for self maintenance. Pt has been advised to continue HEP and follow up with PT or MD if any further issues arise. Patient will follow up with his doctor for the next steps. Marcus Mcneil has not met his therapy goals, and is no longer in need of skilled PT services d/t following up with the doctor from no major relief. They will be discharged at this time.      Plan   Patient will be discharged with an independent HEP program and progressions to continue outside of physical therapy.                    Goals      Goal 1: Patient will demonstrate independence in prescribed HEP with proper form, sets and reps for safe discharge to an independent program.    11/21: compliant with HEP and will benefit from progressions in hip strengthening and mobility. - MD   Sessions: 20      Goal 2: STG:    Patient will demonstrate improvements in hamstring length to 70 degrees using straight leg raise allow patient to stand with appropriate posture and weight distribution for 30 minutes to allow patient to perform grooming tasks    07/03/23 not met, still pain with grooming activities will be following up with the doctor  - [facility]   Sessions: 7      Goal 3:  Patient will demonstrate lumbopelvic flexion AROM (fingertip to floor) to 10 cm to allow pt to facilitate lifting groceries from the floor without pain or numbness    07/03/23:  not met, still limited, difficulty with lifting groceries , still numbness - IH   Sessions: 20      Goal 4: Patient will demonstrate 5/5 MMT hip abduction strength to facilitate normalized gait pattern without hip drop or knee valgus for pain free ambulation for 30 minutes to perform community ambulation without pain or numbness    07/03/23: not met, still having numbness with ambulation , improved hip abduction strength still weaker than R- IH   Sessions: 20                                  Marcus Mcneil J Marcus Mcneil, DPT      Parts of this note were generated by the Epic EMR system/Dragon speech recognition and may contain inherent errors or omissions not intended by the user. Grammatical errors, random word insertions, deletions, pronoun errors and incomplete sentences are occasional consequences of this technology due to software limitations. Not all errors are caught or corrected.  If there are questions or concerns about the content of this note or information contained within the body of this dictation they should be addressed directly with the author for clarification.

## 2023-07-20 ENCOUNTER — Other Ambulatory Visit: Payer: Self-pay | Admitting: Cardiology

## 2023-07-25 ENCOUNTER — Other Ambulatory Visit: Payer: Self-pay

## 2023-07-25 DIAGNOSIS — I4819 Other persistent atrial fibrillation: Secondary | ICD-10-CM

## 2023-08-06 ENCOUNTER — Telehealth (HOSPITAL_COMMUNITY): Payer: Self-pay | Admitting: *Deleted

## 2023-08-06 NOTE — Telephone Encounter (Signed)
Reaching out to patient to offer assistance regarding upcoming cardiac imaging study; pt verbalizes understanding of appt date/time, parking situation and where to check in, pre-test NPO status and verified current allergies; name and call back number provided for further questions should they arise  Larey Brick RN Navigator Cardiac Imaging [facility] [phone] office [phone] cell  Patient aware to arrive at 9:30 AM.

## 2023-08-08 ENCOUNTER — Ambulatory Visit (HOSPITAL_COMMUNITY)
Admission: RE | Admit: 2023-08-08 | Discharge: 2023-08-08 | Disposition: A | Discharge status: Home / Self Care | Payer: BC Managed Care – PPO | Source: Ambulatory Visit | Attending: Cardiology | Admitting: Cardiology

## 2023-08-08 ENCOUNTER — Encounter: Payer: Self-pay | Admitting: Cardiology

## 2023-08-08 DIAGNOSIS — I4819 Other persistent atrial fibrillation: Secondary | ICD-10-CM | POA: Insufficient documentation

## 2023-08-08 MED ORDER — IOHEXOL 350 MG/ML SOLN
95.0000 mL | Freq: Once | INTRAVENOUS | Status: AC | PRN
  Administered 2023-08-08: 95 mL via INTRAVENOUS

## 2023-08-09 ENCOUNTER — Ambulatory Visit (HOSPITAL_COMMUNITY): Admission: RE | Admit: 2023-08-09 | Payer: BC Managed Care – PPO | Source: Ambulatory Visit

## 2023-08-20 NOTE — Pre-Procedure Instructions (Signed)
 Attempted to call patient regarding procedure instructions.  Left voicemail on  the following items: Arrival time 0830 Nothing to eat or drink after midnight No meds AM of procedure Responsible person to drive you home and stay with you for 24 hrs  Have you missed any doses of anti-coagulant Eliquis- should be taken twice a day,  if you have missed any doses please let us know.  Don't take dose in the morning.

## 2023-08-21 ENCOUNTER — Ambulatory Visit (HOSPITAL_COMMUNITY): Payer: BC Managed Care – PPO | Admitting: Certified Registered Nurse Anesthetist

## 2023-08-21 ENCOUNTER — Ambulatory Visit (HOSPITAL_COMMUNITY)
Admission: RE | Admit: 2023-08-21 | Discharge: 2023-08-21 | Disposition: A | Discharge status: Home / Self Care | Payer: BC Managed Care – PPO | Attending: Cardiology | Admitting: Cardiology

## 2023-08-21 ENCOUNTER — Other Ambulatory Visit: Payer: Self-pay

## 2023-08-21 ENCOUNTER — Encounter (HOSPITAL_COMMUNITY)
Admission: RE | Disposition: A | Discharge status: Home / Self Care | Payer: Self-pay | Source: Home / Self Care | Attending: Cardiology

## 2023-08-21 DIAGNOSIS — I4819 Other persistent atrial fibrillation: Secondary | ICD-10-CM | POA: Diagnosis not present

## 2023-08-21 HISTORY — PX: ATRIAL FIBRILLATION ABLATION: EP1191

## 2023-08-21 LAB — CBC
HCT: 48.5 % (ref 39.0–52.0)
Hemoglobin: 16.5 g/dL (ref 13.0–17.0)
MCH: 31.9 pg (ref 26.0–34.0)
MCHC: 34 g/dL (ref 30.0–36.0)
MCV: 93.6 fL (ref 80.0–100.0)
Platelets: 155 10*3/uL (ref 150–400)
RBC: 5.18 MIL/uL (ref 4.22–5.81)
RDW: 13 % (ref 11.5–15.5)
WBC: 6.4 10*3/uL (ref 4.0–10.5)
nRBC: 0 % (ref 0.0–0.2)

## 2023-08-21 LAB — BASIC METABOLIC PANEL
Anion gap: 6 (ref 5–15)
BUN: 19 mg/dL (ref 8–23)
CO2: 23 mmol/L (ref 22–32)
Calcium: 8.9 mg/dL (ref 8.9–10.3)
Chloride: 112 mmol/L — ABNORMAL HIGH (ref 98–111)
Creatinine, Ser: 0.99 mg/dL (ref 0.61–1.24)
GFR, Estimated: >60 mL/min (ref >60)
Glucose, Bld: 98 mg/dL (ref 70–99)
Potassium: 4.1 mmol/L (ref 3.5–5.1)
Sodium: 141 mmol/L (ref 135–145)

## 2023-08-21 SURGERY — ATRIAL FIBRILLATION ABLATION
Anesthesia: General

## 2023-08-21 MED ORDER — HEPARIN SODIUM (PORCINE) 1000 UNIT/ML IJ SOLN
INTRAMUSCULAR | Status: DC | PRN
  Administered 2023-08-21: 15000 [IU] via INTRAVENOUS
  Administered 2023-08-21: 6000 [IU] via INTRAVENOUS

## 2023-08-21 MED ORDER — HEPARIN (PORCINE) IN NACL 1000-0.9 UT/500ML-% IV SOLN
INTRAVENOUS | Status: DC | PRN
  Administered 2023-08-21 (×3): 500 mL

## 2023-08-21 MED ORDER — ONDANSETRON HCL 4 MG/2ML IJ SOLN
INTRAMUSCULAR | Status: DC | PRN
  Administered 2023-08-21: 4 mg via INTRAVENOUS

## 2023-08-21 MED ORDER — ONDANSETRON HCL 4 MG/2ML IJ SOLN: 4.0000 mg | Freq: Four times a day (QID) | INTRAMUSCULAR | Status: DC | PRN

## 2023-08-21 MED ORDER — SODIUM CHLORIDE 0.9% FLUSH: 3.0000 mL | Freq: Two times a day (BID) | INTRAVENOUS | Status: DC

## 2023-08-21 MED ORDER — ATROPINE SULFATE 1 MG/10ML IJ SOSY
PREFILLED_SYRINGE | INTRAMUSCULAR | Status: DC | PRN
  Administered 2023-08-21: 1 mg via INTRAVENOUS

## 2023-08-21 MED ORDER — SODIUM CHLORIDE 0.9% FLUSH: 3.0000 mL | INTRAVENOUS | Status: DC | PRN

## 2023-08-21 MED ORDER — PROTAMINE SULFATE 10 MG/ML IV SOLN
INTRAVENOUS | Status: DC | PRN
  Administered 2023-08-21: 40 mg via INTRAVENOUS
  Administered 2023-08-21: 20 mg via INTRAVENOUS

## 2023-08-21 MED ORDER — SUGAMMADEX SODIUM 200 MG/2ML IV SOLN
INTRAVENOUS | Status: DC | PRN
  Administered 2023-08-21: 200 mg via INTRAVENOUS

## 2023-08-21 MED ORDER — LIDOCAINE 2% (20 MG/ML) 5 ML SYRINGE
INTRAMUSCULAR | Status: DC | PRN
  Administered 2023-08-21: 100 mg via INTRAVENOUS

## 2023-08-21 MED ORDER — PHENYLEPHRINE 80 MCG/ML (10ML) SYRINGE FOR IV PUSH (FOR BLOOD PRESSURE SUPPORT)
PREFILLED_SYRINGE | INTRAVENOUS | Status: DC | PRN
  Administered 2023-08-21: 80 ug via INTRAVENOUS

## 2023-08-21 MED ORDER — ACETAMINOPHEN 325 MG PO TABS: 650.0000 mg | ORAL_TABLET | ORAL | Status: DC | PRN

## 2023-08-21 MED ORDER — SODIUM CHLORIDE 0.9 % IV SOLN: INTRAVENOUS | Status: DC

## 2023-08-21 MED ORDER — FENTANYL CITRATE (PF) 100 MCG/2ML IJ SOLN
INTRAMUSCULAR | Status: AC
  Filled 2023-08-21: qty 2

## 2023-08-21 MED ORDER — SODIUM CHLORIDE 0.9 % IV SOLN
250.0000 mL | INTRAVENOUS | Status: DC | PRN
Start: 2023-08-21 — End: 2023-08-21

## 2023-08-21 MED ORDER — PROPOFOL 10 MG/ML IV BOLUS
INTRAVENOUS | Status: DC | PRN
  Administered 2023-08-21: 150 mg via INTRAVENOUS

## 2023-08-21 MED ORDER — ROCURONIUM BROMIDE 10 MG/ML (PF) SYRINGE
PREFILLED_SYRINGE | INTRAVENOUS | Status: DC | PRN
  Administered 2023-08-21: 70 mg via INTRAVENOUS
  Administered 2023-08-21: 10 mg via INTRAVENOUS

## 2023-08-21 MED ORDER — DEXAMETHASONE SODIUM PHOSPHATE 10 MG/ML IJ SOLN
INTRAMUSCULAR | Status: DC | PRN
  Administered 2023-08-21: 10 mg via INTRAVENOUS

## 2023-08-21 MED ORDER — FENTANYL CITRATE (PF) 100 MCG/2ML IJ SOLN
INTRAMUSCULAR | Status: DC | PRN
  Administered 2023-08-21: 100 ug via INTRAVENOUS

## 2023-08-21 SURGICAL SUPPLY — 22 items
BAG SNAP BAND KOVER 36X36 (MISCELLANEOUS) IMPLANT
CABLE PFA RX CATH CONN (CABLE) IMPLANT
CATH 8FR REPROCESSED SOUNDSTAR (CATHETERS) ×1
CATH 8FR SOUNDSTAR REPROCESSED (CATHETERS) IMPLANT
CATH FARAWAVE ABLATION 31 (CATHETERS) IMPLANT
CATH OCTARAY 2.0 F 3-3-3-3-3 (CATHETERS) IMPLANT
CATH WEB BI DIR CSDF CRV REPRO (CATHETERS) IMPLANT
CLOSURE MYNX CONTROL 6F/7F (Vascular Products) IMPLANT
CLOSURE PERCLOSE PROSTYLE (VASCULAR PRODUCTS) IMPLANT
COVER SWIFTLINK CONNECTOR (BAG) ×1 IMPLANT
DILATOR VESSEL 38 20CM 16FR (INTRODUCER) IMPLANT
GUIDEWIRE INQWIRE 1.5J.035X260 (WIRE) IMPLANT
INQWIRE 1.5J .035X260CM (WIRE) ×1
KIT VERSACROSS CNCT FARADRIVE (KITS) IMPLANT
MAT PREVALON FULL STRYKER (MISCELLANEOUS) IMPLANT
PACK EP LF (CUSTOM PROCEDURE TRAY) ×1 IMPLANT
PAD DEFIB RADIO PHYSIO CONN (PAD) ×1 IMPLANT
PATCH CARTO3 (PAD) IMPLANT
SHEATH FARADRIVE STEERABLE (SHEATH) IMPLANT
SHEATH PINNACLE 8F 10CM (SHEATH) IMPLANT
SHEATH PINNACLE 9F 10CM (SHEATH) IMPLANT
SHEATH PROBE COVER 6X72 (BAG) IMPLANT

## 2023-08-21 NOTE — Anesthesia Postprocedure Evaluation (Signed)
 Anesthesia Post Note  Patient: Jorge Kelly  Procedure(s) Performed: ATRIAL FIBRILLATION ABLATION     Patient location during evaluation: PACU Anesthesia Type: General Level of consciousness: awake and alert Pain management: pain level controlled Vital Signs Assessment: post-procedure vital signs reviewed and stable Respiratory status: spontaneous breathing, nonlabored ventilation, respiratory function stable and patient connected to nasal cannula oxygen Cardiovascular status: blood pressure returned to baseline and stable Postop Assessment: no apparent nausea or vomiting Anesthetic complications: yes   Encounter Notable Events  Notable Event Outcome Phase Comment  Difficult to intubate - expected  <72 hours postprocedure Filed from anesthesia note documentation.    Last Vitals:  Vitals:   08/21/23 1500 08/21/23 1542  BP: 107/83   Pulse: 77 82  Resp: 16 15  Temp:    SpO2: 90% 90%    Last Pain:  Vitals:   08/21/23 1554  TempSrc:   PainSc: 0-No pain                 Leslye Rast

## 2023-08-21 NOTE — Discharge Instructions (Signed)

## 2023-08-21 NOTE — H&P (Signed)
  Electrophysiology Office Note:   Date:  08/21/2023  ID:  MATHANIEL VIVIRITO, DOB 1955-10-14, MRN [id number]  Primary Cardiologist: Christoper Crafts, MD Electrophysiologist: Lei Pump, MD      History of Present Illness:   MY SIDEL is a 68 y.o. male with h/o Adolfo Hooker artery disease, atrial fibrillation seen today for routine electrophysiology followup.   He was diagnosed with atrial fibrillation in 2015.  He has been on flecainide .  In 2022 he has had more frequent episodes of atrial fibrillation over the last few months.  He wore a cardiac monitor that showed a 100% atrial fibrillation burden with symptoms of palpitations, fatigue, dizziness.  He had an A-fib ablation 06/15/2021.  He went back into atrial fibrillation and was loaded on amiodarone  in 2023 with chemical cardioversion.  He is unfortunately continued to have episodes of atrial fibrillation making him feel fatigued and short of breath.  Today, denies symptoms of palpitations, chest pain, shortness of breath, orthopnea, PND, lower extremity edema, claudication, dizziness, presyncope, syncope, bleeding, or neurologic sequela. The patient is tolerating medications without difficulties. Plan ablation today.   EP Information / Studies Reviewed:    EKG is ordered today. Personal review as below.      Risk Assessment/Calculations:    CHA2DS2-VASc Score = 2   This indicates a 2.2% annual risk of stroke. The patient's score is based upon: CHF History: 0 HTN History: 0 Diabetes History: 0 Stroke History: 0 Vascular Disease History: 1 Age Score: 1 Gender Score: 0            Physical Exam:   VS:  BP (!) 139/98   Pulse 80   Temp 97.7 F (36.5 C) (Oral)   Resp 16   Ht 6\' 1"  (1.854 m)   Wt 115.7 kg   SpO2 96%   BMI 33.64 kg/m    Wt Readings from Last 3 Encounters:  08/21/23 115.7 kg  04/29/23 113.7 kg  04/04/23 113.3 kg    GEN: No acute distress.   Neck: No JVD Cardiac: RRR, no murmurs, rubs, or gallops.   Respiratory: decreased BS bases bilaterally. GI: Soft, nontender, non-distended  MS: No edema; No deformity. Neuro:  Nonfocal  Skin: warm and dry Psych: Normal affect    ASSESSMENT AND PLAN:    1.  Persistent atrial fibrillation: ZANDYR MEADER has presented today for surgery, with the diagnosis of AF.  The various methods of treatment have been discussed with the patient and family. After consideration of risks, benefits and other options for treatment, the patient has consented to  Procedure(s): Catheter ablation as a surgical intervention .  Risks include but not limited to complete heart block, stroke, esophageal damage, nerve damage, bleeding, vascular damage, tamponade, perforation, MI, and death. The patient's history has been reviewed, patient examined, no change in status, stable for surgery.  I have reviewed the patient's chart and labs.  Questions were answered to the patient's satisfaction.    Carloyn Lahue Lawana Pray, MD 08/21/2023 9:58 AM

## 2023-08-21 NOTE — Anesthesia Procedure Notes (Signed)
 Procedure Name: Intubation Date/Time: 08/21/2023 10:53 AM  Performed by: Colen Daunt, CRNAPre-anesthesia Checklist: Patient identified, Emergency Drugs available, Suction available and Patient being monitored Patient Re-evaluated:Patient Re-evaluated prior to induction Oxygen Delivery Method: Circle system utilized Preoxygenation: Pre-oxygenation with 100% oxygen Induction Type: IV induction Ventilation: Mask ventilation without difficulty, Two handed mask ventilation required and Oral airway inserted - appropriate to patient size Laryngoscope Size: Glidescope and 4 Grade View: Grade I Tube type: Oral Tube size: 7.5 mm Number of attempts: 1 Airway Equipment and Method: Stylet and Oral airway Placement Confirmation: ETT inserted through vocal cords under direct vision, positive ETCO2 and breath sounds checked- equal and bilateral Secured at: 23 cm Tube secured with: Tape Dental Injury: Teeth and Oropharynx as per pre-operative assessment  Difficulty Due To: Difficulty was anticipated Comments: Previous glidescope intubation

## 2023-08-21 NOTE — Transfer of Care (Signed)
 Immediate Anesthesia Transfer of Care Note  Patient: Jorge Kelly  Procedure(s) Performed: ATRIAL FIBRILLATION ABLATION  Patient Location: Cath Lab  Anesthesia Type:General  Level of Consciousness: drowsy and patient cooperative  Airway & Oxygen Therapy: Patient Spontanous Breathing and Patient connected to nasal cannula oxygen  Post-op Assessment: Report given to RN, Post -op Vital signs reviewed and stable, and Patient moving all extremities X 4  Post vital signs: Reviewed and stable  Last Vitals:  Vitals Value Taken Time  BP 125/87   Temp    Pulse 79   Resp 14   SpO2 94     Last Pain:  Vitals:   08/21/23 0912  TempSrc: Oral  PainSc:          Complications: There were no known notable events for this encounter.

## 2023-08-21 NOTE — Anesthesia Preprocedure Evaluation (Signed)
 Anesthesia Evaluation  Patient identified by MRN, date of birth, ID band Patient awake    Reviewed: Allergy & Precautions, NPO status , Patient's Chart, lab work & pertinent test results, reviewed documented beta blocker date and time   History of Anesthesia Complications Negative for: history of anesthetic complications  Airway Mallampati: IV  TM Distance: >3 FB Neck ROM: Full    Dental no notable dental hx.    Pulmonary neg COPD, neg PE   breath sounds clear to auscultation       Cardiovascular (-) hypertension(-) CAD, (-) Past MI and (-) Cardiac Stents + dysrhythmias Atrial Fibrillation  Rhythm:Regular Rate:Normal     Neuro/Psych  Headaches, neg Seizures  Neuromuscular disease    GI/Hepatic ,GERD  Medicated and Controlled,,(+) neg Cirrhosis        Endo/Other    Renal/GU Renal disease     Musculoskeletal   Abdominal   Peds  Hematology   Anesthesia Other Findings   Reproductive/Obstetrics                             Anesthesia Physical Anesthesia Plan  ASA: 2  Anesthesia Plan: General   Post-op Pain Management:    Induction: Intravenous  PONV Risk Score and Plan: 2 and Ondansetron  and Dexamethasone   Airway Management Planned: Oral ETT and Video Laryngoscope Planned  Additional Equipment:   Intra-op Plan:   Post-operative Plan: Extubation in OR  Informed Consent: I have reviewed the patients History and Physical, chart, labs and discussed the procedure including the risks, benefits and alternatives for the proposed anesthesia with the patient or authorized representative who has indicated his/her understanding and acceptance.     Dental advisory given  Plan Discussed with: CRNA  Anesthesia Plan Comments:        Anesthesia Quick Evaluation

## 2023-08-22 ENCOUNTER — Encounter: Payer: Self-pay | Admitting: Cardiology

## 2023-08-22 ENCOUNTER — Encounter (HOSPITAL_COMMUNITY): Payer: Self-pay | Admitting: Cardiology

## 2023-08-22 NOTE — Telephone Encounter (Signed)
I called the pt and he has some bruising at his puncture site.... no pain, no edema, and no bleeding... I explained to him that bruising is expected and to keep an eye on where it is now so he can compare its progress and at this point should not get much worse and start to improve... he will monitor and call if anything changes.. he is aware of signs and symptoms of infection.

## 2023-08-23 LAB — POCT ACTIVATED CLOTTING TIME: Activated Clotting Time: 273 s

## 2023-08-23 NOTE — Telephone Encounter (Signed)
Called pt, made aware this is normal. Advised of what to watch for and when to call back/go to ED.  Patient really appreciates my call before leaving the office to discuss this, has eased his mind.

## 2023-09-12 ENCOUNTER — Telehealth: Payer: Self-pay | Admitting: Cardiology

## 2023-09-12 NOTE — Telephone Encounter (Signed)
 Patient contacted 3x with no success, will send mychart message

## 2023-09-12 NOTE — Telephone Encounter (Signed)
-----   Message from Nurse Sherri P sent at 08/30/2023  2:50 PM EST ----- Regarding: Est w/ Jorge Kelly Former [name] pt, need to establish w new cardiologist.  Please establish pt w/ Dr. Renna Kelly. Pt aware someone will call him to schedule.  Thx Sherri

## 2023-09-18 ENCOUNTER — Encounter (HOSPITAL_COMMUNITY): Payer: Self-pay | Admitting: Physician Assistant

## 2023-09-18 ENCOUNTER — Ambulatory Visit (HOSPITAL_COMMUNITY)
Admission: RE | Admit: 2023-09-18 | Discharge: 2023-09-18 | Disposition: A | Discharge status: Home / Self Care | Payer: BC Managed Care – PPO | Source: Ambulatory Visit | Attending: Physician Assistant | Admitting: Physician Assistant

## 2023-09-18 VITALS — BP 142/88 | HR 85 | Ht 73.0 in | Wt 255.2 lb

## 2023-09-18 DIAGNOSIS — Z6833 Body mass index (BMI) 33.0-33.9, adult: Secondary | ICD-10-CM | POA: Diagnosis not present

## 2023-09-18 DIAGNOSIS — I2510 Atherosclerotic heart disease of native coronary artery without angina pectoris: Secondary | ICD-10-CM | POA: Diagnosis not present

## 2023-09-18 DIAGNOSIS — Z5181 Encounter for therapeutic drug level monitoring: Secondary | ICD-10-CM | POA: Insufficient documentation

## 2023-09-18 DIAGNOSIS — I4819 Other persistent atrial fibrillation: Secondary | ICD-10-CM | POA: Insufficient documentation

## 2023-09-18 DIAGNOSIS — D6869 Other thrombophilia: Secondary | ICD-10-CM | POA: Diagnosis not present

## 2023-09-18 DIAGNOSIS — Z79899 Other long term (current) drug therapy: Secondary | ICD-10-CM | POA: Insufficient documentation

## 2023-09-18 DIAGNOSIS — Z7901 Long term (current) use of anticoagulants: Secondary | ICD-10-CM | POA: Diagnosis not present

## 2023-09-18 DIAGNOSIS — E669 Obesity, unspecified: Secondary | ICD-10-CM | POA: Diagnosis not present

## 2023-09-18 NOTE — Progress Notes (Addendum)
Primary Care Physician: Shelva Majestic, MD Primary Cardiologist: Dr Shari Prows  Primary Electrophysiologist: Dr Elberta Fortis  Referring Physician: HeartCare Triage    Jorge Kelly is a 68 y.o. male with a history of CAD and atrial fibrillation who presents for follow up in the [facility].  The patient was initially diagnosed with atrial fibrillation in 2015. He has been maintained on flecainide. Patient is on Eliquis for a CHADS2VASC score of 2. Patient had done well with little afib until 12/2020 when he was noted to be in afib at his follow up. Patient admits he ran out of his medication. He resumed the medication and a heart monitor was placed to evaluate if he was persistent vs paroxysmal. The monitor showed 100 % afib burden. He is symptomatic with fatigue, palpitations, and dizziness. Patient underwent unsuccessful DCCV on 04/13/21. He was referred to Dr Johney Frame and underwent afib ablation on 06/15/21. He had recurrent afib and underwent DCCV on 08/01/21. He was again in afib on follow up on 08/17/21 and was loaded on amiodarone with plan for repeat DCCV but he chemically converted prior to the procedure. Amiodarone eventually discontinued. Unfortunately, he had recurrence of persistent afib and was reloaded on amiodarone as a bridge to ablation. He is s/p repeat ablation with Dr Elberta Fortis on 08/21/23.  Patient returns for follow up for atrial fibrillation and amiodarone monitoring. He reports that he has not had any symptoms of afib since his ablation. He did have right groin swelling and bruising for 2-3 weeks but this has resolved. No bleeding issues on anticoagulation.   Today, he denies symptoms of palpitations, chest pain, shortness of breath, orthopnea, PND, lower extremity edema, dizziness, presyncope, syncope, snoring, daytime somnolence, bleeding, or neurologic sequela. The patient is tolerating medications without difficulties and is otherwise without complaint  today.    Atrial Fibrillation Risk Factors:  he does not have symptoms or diagnosis of sleep apnea. Negative sleep study 2015. he does not have a history of rheumatic fever. he does not have a history of alcohol use. The patient does not have a history of early familial atrial fibrillation or other arrhythmias.   Atrial Fibrillation Management history:  Previous antiarrhythmic drugs: Multaq, flecainide, amiodarone  Previous cardioversions: 2015, 2016, 08/01/21 Previous ablations: 06/15/21 Anticoagulation history: Eliquis   Past Medical History:  Diagnosis Date   Actinic keratosis 10/24/2009   ALLERGIC RHINITIS 05/11/2007   ELEVATED BLOOD PRESSURE WITHOUT DIAGNOSIS OF HYPERTENSION 11/12/2007   Dr. Rennis Golden mentioned HTN on problem list   Esophageal stricture    treated with protonix   GERD 05/11/2007   GI bleeding    20 years ago   Kidney stone    Melanoma (HCC) 01/2014   remoted from forehead   MIGRAINE HEADACHE 05/11/2007   Overweight    Persistent atrial fibrillation (HCC) 05/2014   Snores    ROS- All systems are reviewed and negative except as per the HPI above.  Physical Exam: Vitals:   09/18/23 1333  BP: (!) 142/88  Pulse: 85  Weight: 115.8 kg  Height: 6\' 1"  (1.854 m)     GEN: Well nourished, well developed in no acute distress CARDIAC: Regular rate and rhythm, no murmurs, rubs, gallops RESPIRATORY:  Clear to auscultation without rales, wheezing or rhonchi  ABDOMEN: Soft, non-tender, non-distended EXTREMITIES:  No edema; No deformity    Wt Readings from Last 3 Encounters:  09/18/23 115.8 kg  08/21/23 115.7 kg  04/29/23 113.7 kg    EKG  today demonstrates  SR Vent. rate 85 BPM PR interval 174 ms QRS duration 106 ms QT/QTcB 404/480 ms   Echo 03/14/23 demonstrated   1. Left ventricular ejection fraction, by estimation, is 55%. The left  ventricle has normal function. The left ventricle has no regional wall  motion abnormalities. There is mild  left ventricular hypertrophy. Left  ventricular diastolic parameters are indeterminate.   2. Right ventricular systolic function is normal. The right ventricular  size is normal.   3. Left atrial size was mildly dilated.   4. Mild mitral valve regurgitation.   5. The aortic valve is tricuspid. Aortic valve regurgitation is mild.  Aortic valve sclerosis/calcification is present, without any evidence of  aortic stenosis.   6. Aortic root index is normal (1.8 cm/m2).   7. The inferior vena cava is normal in size with greater than 50%  respiratory variability, suggesting right atrial pressure of 3 mmHg.   Comparison(s): The left ventricular function is unchanged.   Epic records are reviewed at length today  CHA2DS2-VASc Score = 2  The patient's score is based upon: CHF History: 0 HTN History: 0 Diabetes History: 0 Stroke History: 0 Vascular Disease History: 1 Age Score: 1 Gender Score: 0       ASSESSMENT AND PLAN: Persistent Atrial Fibrillation (ICD10:  I48.19) The patient's CHA2DS2-VASc score is 2, indicating a 2.2% annual risk of stroke.   S/p ablation 06/15/21 and 08/21/23 Patient appears to be maintaining SR Continue amiodarone 200 mg daily for now Continue diltiazem 240 mg daily Continue Eliquis 5 mg BID with no missed doses for 3 months post ablation.  Secondary Hypercoagulable State (ICD10:  D68.69) The patient is at significant risk for stroke/thromboembolism based upon his CHA2DS2-VASc Score of 2.  Continue Apixaban (Eliquis). No bleeding issues.   High Risk Medication Monitoring (ICD 10: Z79.899) Intervals on ECG appropriate for amiodarone monitoring.    Obesity Body mass index is 33.67 kg/m.  Encouraged lifestyle modification  CAD CAC score 1330 on CT No anginal symptoms Previously followed by Dr Shari Prows Will refer to the prevention clinic    Follow up with Canary Brim as scheduled.    Jorja Loa PA-C Afib Clinic [facility] [address] [place], [place] [address] [phone] 09/18/2023 1:39 PM

## 2023-09-27 DIAGNOSIS — C439 Malignant melanoma of skin, unspecified: Secondary | ICD-10-CM | POA: Insufficient documentation

## 2023-09-30 ENCOUNTER — Telehealth: Payer: Self-pay | Admitting: *Deleted

## 2023-09-30 NOTE — Telephone Encounter (Signed)
 Pharmacy please advise on holding Eliquis prior to eyelid reconstruction procedure with tarsoconjunctival flap scheduled for 10/15/2023. Thank you.

## 2023-09-30 NOTE — Telephone Encounter (Signed)
   Pre-operative Risk Assessment    Patient Name: Jorge Kelly  DOB: 1955-09-13 MRN: [id number]   Date of last office visit: 04/29/23 DR. CAMNITZ Date of next office visit: 11/19/23 [facility]   Request for Surgical Clearance    Procedure:   RECONSTRUCTION OF EYELID, FULL THICKNESS BY TRANSFER OF TARSOCONJUNCTIVAL FLAP FROM OPPOSING EYELID; TOTAL EYELID, LOWER 1 STAGE OR FIRST STAGE. FULL THICKNESS GRAFT  Date of Surgery:  Clearance 10/15/23                                Surgeon:  DR. Raynelle Fanning ANN WOODWARD Surgeon's Group or Practice Name:  [facility] Phone number:  [phone] Fax number:  [phone]   Type of Clearance Requested:   - Medical  - Pharmacy:  Hold Apixaban (Eliquis)     Type of Anesthesia:  General    Additional requests/questions:    Elpidio Anis   09/30/2023, 12:55 PM

## 2023-10-01 NOTE — Telephone Encounter (Signed)
 I s/w Blanch in the [facility] PAT dept in regard to notes per preop APP Robin Searing, NP:  Gaston Islam., NP  Cv Div Preop Callback13 minutes ago (11:49 AM)   Please contact patient and advise that procedure will have to be delayed until after 11/19/2023 due to recent AF ablation on 08/21/2023.  He is scheduled to follow-up with Canary Brim, NP on 11/19/2023 and should keep that appointment for clearance.  Thanks   Zenaida Niece states she will let the surgeon know procedure will need to be post poned until after 11/19/23 due recent a-fib ablation.    I will also call the pt. I s/w the pt and has been informed procedure with Dr. Kate Sable will need to be post poned until after 11/19/23 due to a-fib ablation 08/21/23 per Dr. Elberta Fortis and the preop APP Robin Searing, NP  Pt advised to keep the appt 11/19/23 with Dion Body, NP f/u 3 month ablation f/u and d/w preop clearance then as well.   Pt thanked me for the help. I will fax these notes to Dr. Kate Sable as well.

## 2023-10-01 NOTE — Telephone Encounter (Signed)
 Patient with diagnosis of Afib on Eliquis for anticoagulation.    Procedure: eyelid reconstruction procedure with tarsoconjunctival flap  Date of procedure: 10/15/23   CHA2DS2-VASc Score = 2   This indicates a 2.2% annual risk of stroke. The patient's score is based upon: CHF History: 0 HTN History: 0 Diabetes History: 0 Stroke History: 0 Vascular Disease History: 1 Age Score: 1 Gender Score: 0     Pt had ablation on 08/21/23 uninterrupted anticoagulation recommended 3 months post ablation. Ok to hold DOAC anytime after 11/19/23.    **This guidance is not considered finalized until pre-operative APP has relayed final recommendations.**

## 2023-10-17 ENCOUNTER — Encounter (HOSPITAL_BASED_OUTPATIENT_CLINIC_OR_DEPARTMENT_OTHER): Payer: Self-pay

## 2023-10-21 NOTE — Telephone Encounter (Signed)
 Jorge Kelly

## 2023-11-13 ENCOUNTER — Encounter: Payer: Self-pay | Admitting: Family Medicine

## 2023-11-19 ENCOUNTER — Encounter: Payer: Self-pay | Admitting: Pulmonary Disease

## 2023-11-19 ENCOUNTER — Ambulatory Visit: Attending: Pulmonary Disease

## 2023-11-19 ENCOUNTER — Ambulatory Visit: Payer: BC Managed Care – PPO | Attending: Pulmonary Disease | Admitting: Pulmonary Disease

## 2023-11-19 VITALS — BP 114/78 | HR 87 | Ht 74.0 in | Wt 253.2 lb

## 2023-11-19 DIAGNOSIS — I4819 Other persistent atrial fibrillation: Secondary | ICD-10-CM

## 2023-11-19 DIAGNOSIS — D6869 Other thrombophilia: Secondary | ICD-10-CM | POA: Diagnosis not present

## 2023-11-19 DIAGNOSIS — R002 Palpitations: Secondary | ICD-10-CM

## 2023-11-19 DIAGNOSIS — I2510 Atherosclerotic heart disease of native coronary artery without angina pectoris: Secondary | ICD-10-CM | POA: Diagnosis not present

## 2023-11-19 NOTE — Progress Notes (Signed)
 Electrophysiology Office Note:   Date:  11/19/2023  ID:  Jorge Kelly, DOB 1956-01-21, MRN [id number]  Primary Cardiologist: Tobias Alexander, MD Primary Heart Failure: None Electrophysiologist: Will Jorja Loa, MD      History of Present Illness:   Jorge Kelly is a 68 y.o. male with h/o AF, CAD seen today for routine electrophysiology followup.   Since last being seen in our clinic the patient reports he is unsure if he has been having symptoms of atrial fibrillation in the early morning hours.  Patient reports he will wake up and feel like he has a sense of A-fib he stands up and it stops.  He also was not sure if this is related to possible acid reflux.  He feels like he has had increased acid reflux over the last 3 weeks.  Patient reports in the last year and a half he is probably gained approximately 25 pounds.  He notes that when he was first diagnosed with A-fib he lost down to 195 pounds and now is back to 253 pounds.  He does not believe he has symptoms of sleep apnea but does note that he snores.  He has a Kardia mobile but has not checked his rhythm when he has a sense that he is in A-fib.  He denies chest pain, palpitations, dyspnea, PND, orthopnea, nausea, vomiting, dizziness, syncope, edema, weight gain, or early satiety.   Review of systems complete and found to be negative unless listed in HPI.   EP Information / Studies Reviewed:    EKG is ordered today. Personal review as below.  EKG Interpretation Date/Time:  Tuesday November 19 2023 13:20:07 EDT Ventricular Rate:  87 PR Interval:  180 QRS Duration:  110 QT Interval:  410 QTC Calculation: 493 R Axis:   36  Text Interpretation: Normal sinus rhythm Prolonged QT Confirmed by Canary Brim ([id number]) on 11/19/2023 1:28:29 PM   Studies:  EPS 06/15/2021 > AF upon presentation, successful electrical isolation and anatomical encircling of all four pulmonary veins with RF current, WACA approach used, additional LA ablation  performed with a standard box lesion created along the posterior wall of the LA ECHO 03/2023 > LVEF 55%, LA mildly dilated, mild MV regurgitation, mild AV regurgitation CT Cardiac Morphology 08/08/2023 > normal pulmonary vein drainage into the LA, no PFO/ASD, normal coronary origin, CAC of 1330 / 94th percentile for matched controls  EPS 08/21/2023 > SR on presentation, successful electrical isolation & anatomical encircling of all four pulmonary veins with PF, ablation of the posterior wall with PF   Arrhythmia / AAD AF > initial dx ~ 2015  Flecainide > stopped in 2022 with breakthrough episodes of AF Amiodarone >   Risk Assessment/Calculations:    CHA2DS2-VASc Score = 2   This indicates a 2.2% annual risk of stroke. The patient's score is based upon: CHF History: 0 HTN History: 0 Diabetes History: 0 Stroke History: 0 Vascular Disease History: 1 Age Score: 1 Gender Score: 0             Physical Exam:   VS:  BP 114/78   Pulse 87   Ht 6\' 2"  (1.88 m)   Wt 253 lb 3.2 oz (114.9 kg)   SpO2 94%   BMI 32.51 kg/m    Wt Readings from Last 3 Encounters:  11/19/23 253 lb 3.2 oz (114.9 kg)  09/18/23 255 lb 3.2 oz (115.8 kg)  08/21/23 255 lb (115.7 kg)     GEN: Well nourished, well developed  in no acute distress NECK: No JVD; No carotid bruits CARDIAC: Regular rate and rhythm, no murmurs, rubs, gallops RESPIRATORY:  Clear to auscultation without rales, wheezing or rhonchi  ABDOMEN: Soft, non-tender, non-distended EXTREMITIES:  No edema; No deformity   ASSESSMENT AND PLAN:    Persistent Atrial Fibrillation  CHA2DS2-VASc 2, s/p redo ablation 08/21/23  -OAC for stroke prophylaxis  -continue diltiazem 240 mg daily  - Discussed stopping amiodarone with patient but he is hesitant as he is concerned that he continues to have AF.  Plan to keep until next visit in 1 month for follow-up on cardiac monitor. -no confirmed episodes of AF since ablation -monitors with a cardia mobile but has  not checked 20 felt like he was in AF, reviewed checking if he feels he has symptoms. -Plan for short-term monitor for 7 days to assess rhythm and correlate with symptoms. -EKG reviewed with patient, NSR  Secondary Hypercoagulable State  -continue Eliquis 5mg  BID, dose reviewed and appropriate by age/wt   Elevated CAC Score  HLD Aortic Dilation  -per Cardiology  -Patient has appointment 03/11/24 with cardiology, Dr. Renna Cary  Possible GERD / Acid Reflux  -reviewed trial of OTC omeprazole or similar to see if symptoms above resolve   Follow up with EP APP in 4 weeks  Signed, Creighton Doffing, NP-C, AGACNP-BC [facility] - Electrophysiology  11/19/2023, 5:15 PM

## 2023-11-19 NOTE — Progress Notes (Unsigned)
 Enrolled for [employer] to mail a ZIO XT long term holter monitor to the patients address on file.   Dr. Elberta Fortis to read.

## 2023-11-19 NOTE — Patient Instructions (Signed)
 Medication Instructions:  Your physician recommends that you continue on your current medications as directed. Please refer to the Current Medication list given to you today.  *If you need a refill on your cardiac medications before your next appointment, please call your pharmacy*  Lab Work: None ordered If you have labs (blood work) drawn today and your tests are completely normal, you will receive your results only by: MyChart Message (if you have MyChart) OR A paper copy in the mail If you have any lab test that is abnormal or we need to change your treatment, we will call you to review the results.  Testing/Procedures: Christena Deem- Long Term Monitor Instructions  Your physician has requested you wear a ZIO patch monitor for 7 days.  This is a single patch monitor. [employer] supplies one patch monitor per enrollment. Additional stickers are not available. Please do not apply patch if you will be having a Nuclear Stress Test,  Echocardiogram, Cardiac CT, MRI, or Chest Xray during the period you would be wearing the  monitor. The patch cannot be worn during these tests. You cannot remove and re-apply the  ZIO XT patch monitor.  Your ZIO patch monitor will be mailed 3 day USPS to your address on file. It may take 3-5 days  to receive your monitor after you have been enrolled.  Once you have received your monitor, please review the enclosed instructions. Your monitor  has already been registered assigning a specific monitor serial # to you.  Billing and Patient Assistance Program Information  We have supplied [employer] with any of your insurance information on file for billing purposes. [employer] offers a sliding scale Patient Assistance Program for patients that do not have  insurance, or whose insurance does not completely cover the cost of the ZIO monitor.  You must apply for the Patient Assistance Program to qualify for this discounted rate.  To apply, please call [employer] at [phone],  select option 4, select option 2, ask to apply for  Patient Assistance Program. Meredeth Ide will ask your household income, and how many people  are in your household. They will quote your out-of-pocket cost based on that information.  [employer] will also be able to set up a 21-month, interest-free payment plan if needed.  Applying the monitor   Shave hair from upper left chest.  Hold abrader disc by orange tab. Rub abrader in 40 strokes over the upper left chest as  indicated in your monitor instructions.  Clean area with 4 enclosed alcohol pads. Let dry.  Apply patch as indicated in monitor instructions. Patch will be placed under collarbone on left  side of chest with arrow pointing upward.  Rub patch adhesive wings for 2 minutes. Remove Authement label marked "1". Remove the Dzik  label marked "2". Rub patch adhesive wings for 2 additional minutes.  While looking in a mirror, press and release button in center of patch. A small green light will  flash 3-4 times. This will be your only indicator that the monitor has been turned on.  Do not shower for the first 24 hours. You may shower after the first 24 hours.  Press the button if you feel a symptom. You will hear a small click. Record Date, Time and  Symptom in the Patient Logbook.  When you are ready to remove the patch, follow instructions on the last 2 pages of Patient  Logbook. Stick patch monitor onto the last page of Patient Logbook.  Place Patient Logbook in the  blue and Koska box. Use locking tab on box and tape box closed  securely. The blue and Milich box has prepaid postage on it. Please place it in the mailbox as  soon as possible. Your physician should have your test results approximately 7 days after the  monitor has been mailed back to [facility].  Call [facility] Customer Care at [phone] if you have questions regarding  your ZIO XT patch monitor. Call them immediately if you see an orange light blinking on your   monitor.  If your monitor falls off in less than 4 days, contact our Monitor department at [phone].  If your monitor becomes loose or falls off after 4 days call [employer] at [phone] for  suggestions on securing your monitor   Follow-Up: At [facility], you and your health needs are our priority.  As part of our continuing mission to provide you with exceptional heart care, our providers are all part of one team.  This team includes your primary Cardiologist (physician) and Advanced Practice Providers or APPs (Physician Assistants and Nurse Practitioners) who all work together to provide you with the care you need, when you need it.  Your next appointment:   1 month(s)  Provider:   Canary Brim, NP       1st Floor: - Lobby - Registration  - Pharmacy  - Lab - Cafe  2nd Floor: - PV Lab - Diagnostic Testing (echo, CT, nuclear med)  3rd Floor: - Vacant  4th Floor: - TCTS (cardiothoracic surgery) - AFib Clinic - Structural Heart Clinic - Vascular Surgery  - Vascular Ultrasound  5th Floor: - HeartCare Cardiology (general and EP) - Clinical Pharmacy for coumadin, hypertension, lipid, weight-loss medications, and med management appointments    Valet parking services will be available as well.

## 2023-11-26 ENCOUNTER — Encounter: Payer: Self-pay | Admitting: Family Medicine

## 2023-11-26 LAB — COLOGUARD: COLOGUARD: NEGATIVE

## 2023-12-02 ENCOUNTER — Telehealth: Payer: Self-pay

## 2023-12-02 ENCOUNTER — Ambulatory Visit (INDEPENDENT_AMBULATORY_CARE_PROVIDER_SITE_OTHER): Admitting: Family Medicine

## 2023-12-02 ENCOUNTER — Encounter: Payer: Self-pay | Admitting: Family Medicine

## 2023-12-02 ENCOUNTER — Other Ambulatory Visit (HOSPITAL_COMMUNITY): Payer: Self-pay

## 2023-12-02 VITALS — BP 136/62 | HR 77 | Ht 73.5 in | Wt 257.8 lb

## 2023-12-02 DIAGNOSIS — Z23 Encounter for immunization: Secondary | ICD-10-CM | POA: Diagnosis not present

## 2023-12-02 DIAGNOSIS — E669 Obesity, unspecified: Secondary | ICD-10-CM | POA: Diagnosis not present

## 2023-12-02 DIAGNOSIS — I4819 Other persistent atrial fibrillation: Secondary | ICD-10-CM | POA: Diagnosis not present

## 2023-12-02 DIAGNOSIS — E785 Hyperlipidemia, unspecified: Secondary | ICD-10-CM | POA: Diagnosis not present

## 2023-12-02 DIAGNOSIS — R931 Abnormal findings on diagnostic imaging of heart and coronary circulation: Secondary | ICD-10-CM

## 2023-12-02 DIAGNOSIS — Z125 Encounter for screening for malignant neoplasm of prostate: Secondary | ICD-10-CM

## 2023-12-02 DIAGNOSIS — Z0000 Encounter for general adult medical examination without abnormal findings: Secondary | ICD-10-CM

## 2023-12-02 DIAGNOSIS — Z131 Encounter for screening for diabetes mellitus: Secondary | ICD-10-CM | POA: Diagnosis not present

## 2023-12-02 LAB — COMPREHENSIVE METABOLIC PANEL WITH GFR
ALT: 18 U/L (ref 0–53)
AST: 14 U/L (ref 0–37)
Albumin: 4.3 g/dL (ref 3.5–5.2)
Alkaline Phosphatase: 66 U/L (ref 39–117)
BUN: 20 mg/dL (ref 6–23)
CO2: 23 meq/L (ref 19–32)
Calcium: 9.3 mg/dL (ref 8.4–10.5)
Chloride: 109 meq/L (ref 96–112)
Creatinine, Ser: 1.01 mg/dL (ref 0.40–1.50)
GFR: 77.51 mL/min (ref >60.00)
Glucose, Bld: 96 mg/dL (ref 70–99)
Potassium: 4.3 meq/L (ref 3.5–5.1)
Sodium: 140 meq/L (ref 135–145)
Total Bilirubin: 0.8 mg/dL (ref 0.2–1.2)
Total Protein: 6.3 g/dL (ref 6.0–8.3)

## 2023-12-02 LAB — LIPID PANEL
Cholesterol: 113 mg/dL (ref 0–200)
HDL: 46 mg/dL (ref >39.00)
LDL Cholesterol: 49 mg/dL (ref 0–99)
NonHDL: 67.4
Total CHOL/HDL Ratio: 2
Triglycerides: 90 mg/dL (ref 0.0–149.0)
VLDL: 18 mg/dL (ref 0.0–40.0)

## 2023-12-02 LAB — CBC WITH DIFFERENTIAL/PLATELET
Basophils Absolute: 0 10*3/uL (ref 0.0–0.1)
Basophils Relative: 0.4 % (ref 0.0–3.0)
Eosinophils Absolute: 0.2 10*3/uL (ref 0.0–0.7)
Eosinophils Relative: 3.4 % (ref 0.0–5.0)
HCT: 45.6 % (ref 39.0–52.0)
Hemoglobin: 15.6 g/dL (ref 13.0–17.0)
Lymphocytes Relative: 22.3 % (ref 12.0–46.0)
Lymphs Abs: 1.4 10*3/uL (ref 0.7–4.0)
MCHC: 34.3 g/dL (ref 30.0–36.0)
MCV: 92.7 fl (ref 78.0–100.0)
Monocytes Absolute: 0.4 10*3/uL (ref 0.1–1.0)
Monocytes Relative: 6.5 % (ref 3.0–12.0)
Neutro Abs: 4.1 10*3/uL (ref 1.4–7.7)
Neutrophils Relative %: 67.4 % (ref 43.0–77.0)
Platelets: 145 10*3/uL — ABNORMAL LOW (ref 150.0–400.0)
RBC: 4.92 Mil/uL (ref 4.22–5.81)
RDW: 13.3 % (ref 11.5–15.5)
WBC: 6.1 10*3/uL (ref 4.0–10.5)

## 2023-12-02 LAB — PSA: PSA: 5.24 ng/mL — ABNORMAL HIGH (ref 0.10–4.00)

## 2023-12-02 LAB — HEMOGLOBIN A1C: Hgb A1c MFr Bld: 5.2 % (ref 4.6–6.5)

## 2023-12-02 MED ORDER — TIRZEPATIDE-WEIGHT MANAGEMENT 2.5 MG/0.5ML ~~LOC~~ SOAJ
2.5000 mg | SUBCUTANEOUS | 5 refills | Status: DC
  Filled 2023-12-03: qty 2, 28d supply, fill #0
  Filled 2023-12-25: qty 2, 28d supply, fill #1
  Filled 2024-01-21: qty 2, 28d supply, fill #2
  Filled 2024-02-20: qty 2, 28d supply, fill #3
  Filled 2024-03-16: qty 2, 28d supply, fill #4
  Filled 2024-04-21: qty 2, 28d supply, fill #5

## 2023-12-02 NOTE — Progress Notes (Signed)
 Phone: [phone]   Subjective:  Patient presents today for their annual physical. Chief complaint-noted.   See problem oriented charting- ROS- full  review of systems was completed and negative  except for: weight gain, hernia pressure  The following were reviewed and entered/updated in epic: Past Medical History:  Diagnosis Date   Actinic keratosis 10/24/2009   ALLERGIC RHINITIS 05/11/2007   ELEVATED BLOOD PRESSURE WITHOUT DIAGNOSIS OF HYPERTENSION 11/12/2007   Dr. Maximo Spar mentioned HTN on problem list   Esophageal stricture    treated with protonix    GERD 05/11/2007   GI bleeding    20 years ago   Kidney stone    Melanoma (HCC) 01/2014   remoted from forehead   MIGRAINE HEADACHE 05/11/2007   Overweight    Persistent atrial fibrillation (HCC) 05/2014   Snores    Patient Active Problem List   Diagnosis Date Noted   PAF (paroxysmal atrial fibrillation) (HCC) 05/21/2014    Priority: High   Hyperlipidemia, unspecified 08/20/2019    Priority: Medium    History of melanoma 01/04/2014    Priority: Medium    Migraine 05/11/2007    Priority: Medium    Thrombocytopenia (HCC) 12/02/2019    Priority: Low   Inguinal hernia 12/10/2014    Priority: Low   Snoring 08/25/2014    Priority: Low   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    Priority: Low   Actinic keratosis 10/24/2009    Priority: Low   GERD 05/11/2007    Priority: Low   Aortic dilatation (HCC) 04/04/2023   Hypercoagulable state due to persistent atrial fibrillation (HCC) 12/20/2022   Persistent atrial fibrillation (HCC) 04/04/2021   Past Surgical History:  Procedure Laterality Date   ANKLE FRACTURE SURGERY  2010   x2-left   ATRIAL FIBRILLATION ABLATION N/A 06/15/2021   Procedure: ATRIAL FIBRILLATION ABLATION;  Surgeon: Jolly Needle, MD;  Location: [facility];  Service: Cardiovascular;  Laterality: N/A;   ATRIAL FIBRILLATION ABLATION N/A 08/21/2023   Procedure: ATRIAL FIBRILLATION ABLATION;  Surgeon:  Lei Pump, MD;  Location: [facility];  Service: Cardiovascular;  Laterality: N/A;   CARDIOVERSION N/A 06/23/2014   CARDIOVERSION N/A 09/09/2014   CARDIOVERSION N/A 04/13/2021   Procedure: CARDIOVERSION;  Surgeon: Harrold Lincoln, MD;  Location: [facility] ENDOSCOPY;  Service: Cardiovascular;  Laterality: N/A;   CARDIOVERSION N/A 08/01/2021   Procedure: CARDIOVERSION;  Surgeon: Harrold Lincoln, MD;  Location: [facility] ENDOSCOPY;  Service: Cardiovascular;  Laterality: N/A;   malignant melanoma     removed from forehead in June 2015   STRABISMUS SURGERY Right 05/21/2014   Dr. Linder Revere   UPPER GI ENDOSCOPY     stricture, just PPI    Family History  Problem Relation Age of Onset   Lymphoma Mother    Atrial fibrillation Mother        pacemaker in 44s   Leukemia Father        passed from CLL   Cancer Sister        treated, cancer free   Diabetes type II Sister    Heart disease Maternal Uncle        mother lost 6 brothers to heart disease in 56s    Medications- reviewed and updated Current Outpatient Medications  Medication Sig Dispense Refill   acetaminophen  (TYLENOL ) 500 MG tablet Take 1,000 mg by mouth every 6 (six) hours as needed for moderate pain or headache.     amiodarone  (PACERONE ) 200 MG tablet Take 1 tablet (200 mg total) by  mouth daily. 90 tablet 2   apixaban  (ELIQUIS ) 5 MG TABS tablet Take 1 tablet (5 mg total) by mouth 2 (two) times daily. 180 tablet 3   diltiazem  (CARDIZEM  CD) 240 MG 24 hr capsule TAKE 1 CAPSULE BY MOUTH EVERY DAY 90 capsule 3   fexofenadine (ALLEGRA) 180 MG tablet Take 180 mg by mouth daily as needed for allergies or rhinitis.     rizatriptan  (MAXALT -MLT) 10 MG disintegrating tablet Take daily as needed for migraine May repeat in 2 hours if needed x1 9 tablet 2   rosuvastatin  (CRESTOR ) 20 MG tablet Take 1 tablet (20 mg total) by mouth daily. 90 tablet 2   tirzepatide (ZEPBOUND) 2.5 MG/0.5ML injection vial Inject 2.5 mg into the skin once a  week. 2 mL 5   No current facility-administered medications for this visit.    Allergies-reviewed and updated No Known Allergies  Social History   Social History Narrative   Lives in [place] with wife (wife patient at [facility]).  2 sons.- 20 Logan- [employer]-and 25 Luke at [employer] in [place]- later [employer]     Wife with pregnancy at 25 and 39.       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 Oropharynx normal Neck: no thyromegaly CV: RRR no murmurs rubs or gallops Lungs: CTAB no crackles, wheeze, rhonchi Abdomen: soft/nontender/nondistended/normal bowel sounds. No rebound or guarding.  Ext: no edema Skin: warm, dry Neuro: grossly normal, moves all extremities, PERRLA Declines rectal or genitourinary- reports hernia unchanged   Assessment and Plan  68 y.o. male presenting for annual physical.  Health Maintenance counseling: 1. Anticipatory guidance: Patient counseled regarding regular dental exams -q6 months, eye exams - yearly,  avoiding smoking and second hand smoke, limiting alcohol to 2 beverages per day - very little intake overall, no illicit drugs .   2. Risk factor reduction:  Advised patient of need for regular exercise and diet rich and fruits and vegetables to reduce risk of heart attack and stroke.  Exercise- walking 5 days a week- weight training on hold due to hernia.  Diet/weight management-at his last formal physical in 2021 weight was 221 and was down to 207 two years prior to that- see discussion below.  Wt Readings from Last 3 Encounters:  12/02/23 257 lb 12.8 oz (116.9 kg)  11/19/23 253 lb 3.2 oz (114.9 kg)  09/18/23 255 lb 3.2 oz (115.8 kg)  3. Immunizations/screenings/ancillary  studies-fully up-to-date other than declines COVID (plus had COVID in the fall)-  as had Tetanus, Diphtheria, and Pertussis (Tdap) today Immunization History  Administered Date(s) Administered   Influenza Split 04/25/2011   Influenza Whole 07/11/2007, 05/21/2008   Influenza, Seasonal, Injecte, Preservative Fre 05/23/2021   Influenza,inj,Quad PF,6+ Mos 06/06/2014, 10/23/2016, 05/08/2017, 06/09/2019   Moderna Sars-Covid-2 Vaccination 10/15/2019, 11/18/2019   PNEUMOCOCCAL CONJUGATE-20 04/04/2023   Pfizer Covid-19 Vaccine Bivalent Booster 50yrs & up 06/06/2021   Tdap 11/30/2013, 12/02/2023   Zoster Recombinant(Shingrix) 08/20/2019, 12/23/2019   Zoster, Live 04/25/2011  4. Prostate cancer screening-  low risk prior trend- update psa today .  Does have some BPH symptoms including dribbling, urgency, nocturia Lab Results  Component Value  Date   PSA 2.80 08/20/2019   PSA 2.16 11/20/2013   5. Colon cancer screening - Cologuard normal this month-repeat in 3 years  6. Skin cancer screening-follows with dermatology closely due to history of melanoma with Dr. Dorisann Garre twice a year- upcoming surgery in June for his right eyelid for melanoma and has another one on his back (will not need mohs). advised regular sunscreen use. Denies worrisome, changing, or new skin lesions.  7. Smoking associated screening (lung cancer screening, AAA screen 65-75, UA)-never smoker 8. STD screening - only active with wife  Status of chronic or acute concerns   # Obesity S:Weight is up another 8 pounds from last visit.  He is interested in GLP-1 medications.  At last visit he had already gained 50 pounds-had success in the past with calorie counting but much less so recently and was dealing with high appetite  -He had also been interested in [place] at last visit but wanted to discuss with family versus healthy weight wellness program Wt Readings from Last 3 Encounters:  12/02/23 257 lb 12.8 oz (116.9 kg)  11/19/23 253  lb 3.2 oz (114.9 kg)  09/18/23 255 lb 3.2 oz (115.8 kg)  A/P: he has had great success in past with diet/exercise but has been more limited due to heart issues- discussed options and he wants to continue to work on healthy eating and regular exercise but also wants to trial Zepbound 2.5 mg or Wegovy if not covered- he will reach out. Discussed titrating dose if his stagnation in weight loss for at least 3 weeks.   -discussed if we can get hernia in better place adding weight lifting back in to help maintain muscle mass -also discussed this is likely a long term treatment due to potential for rebound weight gain -I think with coronary artery calcium  score being elevated this also is a good idea for him to lower cardiac risk -also encouraged adequate protein intake  #Hernia- still pending due to a fib issues  # Atrial fibrillation-2015 diagnosis #Chronic anticoagulation S: Antiarrhythmic-amiodarone  200 mg-had been on flecainide  in the past  Rate controlled with diltiazem  240 mg extended release.  Rare diltiazem  60 mg as needed for palpitations-patient reports not needing- has fallen off list  Anticoagulated with Eliquis  5 mg twice daily -ablation 06/15/21 with Dr. Nunzio Belch- but returned to a fib after COVID- repeat ablation and currently being tested for recurrence A/P: recent ablation- being tested for recurrence as gets some flutter with laying down. appropriately anticoagulated and rate controlled- continue current medicine    #hyperlipidemia-LDL goal under 70-under 55 even more ideal - CAC score of 1330 which is 94th percentile for age-, race-, and sex-matched controls.  S: Medication: Rosuvastatin  20 mg daily  Lab Results  Component Value Date   CHOL 100 03/14/2023   HDL 38 (L) 03/14/2023   LDLCALC 42 03/14/2023   LDLDIRECT 96.1 09/02/2013   TRIG 109 03/14/2023   CHOLHDL 2.6 03/14/2023  A/P: lipids well controlled in pats- update level-s he's been consistent and add lpa testing- if  elevated discussed would likely need alternate treatment   #Migraines S: Frequency has improved since patient has had weight loss.  He uses Maxalt  10 mg as needed-typically 4x a year of maybe 3  A/P: gave option with coronary artery calcium  score elevation of stopping maxalt  but after discussion he plans to "Discuss with Dr. Renna Cary but with elevated calcium  score we considered stopping maxalt  and starting nurtec"  per his preference as it has  worked well and is using so rarely  #Thrombocytopenia-patient with mild intermittent thrombocytopenia.  Rest of cell lines are normal.  Continue to monitor at least yearly - ok on last check Lab Results  Component Value Date   WBC 6.4 08/21/2023   HGB 16.5 08/21/2023   HCT 48.5 08/21/2023   MCV 93.6 08/21/2023   PLT 155 08/21/2023   # aortic aneurysm ascending S: from Dr. Areta Beer on repeat TTE 03/2022 with root measuring 41mm, ascending aorta measuring 39mm. -Continue annual echoes"  -on 03/14/23 "Aorta: Aortic root index is normal (1.8 cm/m2). The aortic root and  ascending aorta are structurally normal, with no evidence of dilitation. " A/P: not present on most recent imaging- hold of fon repeat unless cardiology advises   Recommended follow up: Return in about 6 months (around 06/02/2024) for followup or sooner if needed.Schedule b4 you leave. Future Appointments  Date Time Provider Department Center  12/19/2023  8:00 AM Thomasena Fleming, NP [facility]  03/11/2024  9:00 AM Hugh Madura, MD [facility]   Lab/Order associations:NOT fasting   ICD-10-CM   1. Preventative health care  Z00.00     2. Need for vaccination  Z23 Tdap vaccine greater than or equal to 7yo IM    3. Persistent atrial fibrillation (HCC)  I48.19 Comprehensive metabolic panel with GFR    CBC with Differential/Platelet    4. Screening for diabetes mellitus  Z13.1 Hemoglobin A1c    5. Obesity (BMI 30-39.9)  E66.9 Hemoglobin A1c    tirzepatide  (ZEPBOUND) 2.5 MG/0.5ML injection vial    6. Screening for prostate cancer  Z12.5 PSA    7. Hyperlipidemia, unspecified hyperlipidemia type  E78.5 Lipid panel    Lipoprotein A (LPA)    8. Elevated coronary artery calcium  score  R93.1 tirzepatide (ZEPBOUND) 2.5 MG/0.5ML injection vial      Meds ordered this encounter  Medications   tirzepatide (ZEPBOUND) 2.5 MG/0.5ML injection vial    Sig: Inject 2.5 mg into the skin once a week.    Dispense:  2 mL    Refill:  5    Return precautions advised.  Clarisa Crooked, MD

## 2023-12-02 NOTE — Telephone Encounter (Signed)
 Pharmacy Patient Advocate Encounter   Received notification from CoverMyMeds that prior authorization for Zepbound 2.5MG /0.5ML pen-injectors is required/requested.   Insurance verification completed.   The patient is insured through [facility] .   Per test claim: PA required; PA submitted to above mentioned insurance via CoverMyMeds Key/confirmation #/EOC (Key: [id number])   Status is pending

## 2023-12-02 NOTE — Patient Instructions (Addendum)
 Discuss with Dr. Renna Cary but with elevated calcium  score we considered stopping maxalt  and starting Nurtec  he has had great success in past with diet/exercise but has been more limited due to heart issues- discussed options and he wants to continue to work on healthy eating and regular exercise but also wants to trial Zepbound 2.5 mg or Wegovy if not covered- he will reach out. Discussed titrating dose if his stagnation in weight loss for at least 3 weeks.   -discussed if we can get hernia in better place adding weight lifting back in to help maintain muscle mass -also discussed this is likely a long term treatment due to potential for rebound weight gain -I think with coronary artery calcium  score being elevated this also is a good idea for him to lower cardiac risk  Please stop by lab before you go If you have mychart- we will send your results within 3 business days of us  receiving them.  If you do not have mychart- we will call you about results within 5 business days of us  receiving them.  *please also note that you will see labs on mychart as soon as they post. I will later go in and write notes on them- will say "notes from Dr. Arlene Ben"   Recommended follow up: Return in about 6 months (around 06/02/2024) for followup or sooner if needed.Schedule b4 you leave.

## 2023-12-03 ENCOUNTER — Other Ambulatory Visit (HOSPITAL_COMMUNITY): Payer: Self-pay

## 2023-12-03 NOTE — Telephone Encounter (Signed)
 Pharmacy Patient Advocate Encounter  Received notification from [facility] that Prior Authorization for Zepbound 2.5MG /0.5ML pen-injectors has been APPROVED  to 12.28.25. Ran test claim, Copay is $50.00. This test claim was processed through [facility]- copay amounts may vary at other pharmacies due to pharmacy/plan contracts, or as the patient moves through the different stages of their insurance plan.   PA #/Case ID/Reference #: (Key: [id number])

## 2023-12-06 ENCOUNTER — Other Ambulatory Visit: Payer: Self-pay

## 2023-12-06 ENCOUNTER — Encounter: Payer: Self-pay | Admitting: Family Medicine

## 2023-12-06 DIAGNOSIS — E7841 Elevated Lipoprotein(a): Secondary | ICD-10-CM

## 2023-12-06 DIAGNOSIS — R972 Elevated prostate specific antigen [PSA]: Secondary | ICD-10-CM

## 2023-12-06 LAB — LIPOPROTEIN A (LPA): Lipoprotein (a): 117 nmol/L — ABNORMAL HIGH (ref <75)

## 2023-12-16 DIAGNOSIS — R002 Palpitations: Secondary | ICD-10-CM | POA: Diagnosis not present

## 2023-12-17 NOTE — Progress Notes (Unsigned)
 Electrophysiology Office Note:   Date:  12/19/2023  ID:  Jorge Kelly, DOB 1955/10/16, MRN [id number]  Primary Cardiologist: Christoper Crafts, MD Primary Heart Failure: None Electrophysiologist: Will Cortland Ding, MD      History of Present Illness:   Jorge Kelly is a 68 y.o. male with h/o AF, CAD seen today for routine electrophysiology followup.   Seen in EP Clinic 11/19/23 for concerns of AF symptoms. He was hesitant to stop amiodarone . Cardiac monitor was repeated which did not show any arrhythmia.   Since last being seen in our clinic the patient reports doing well overall.  He has started a diet + weight loss medication and has lost 6lbs. His prior feeling of fluttering have gone away.  He feels that the sensation was indeed related to acid reflux.   He denies chest pain, palpitations, dyspnea, PND, orthopnea, nausea, vomiting, dizziness, syncope, edema, weight gain, or early satiety.   Review of systems complete and found to be negative unless listed in HPI.   EP Information / Studies Reviewed:    EKG is not ordered today. EKG from 11/19/23 reviewed which showed NSR 87 bpm      Studies:  EPS 06/15/2021 > AF upon presentation, successful electrical isolation and anatomical encircling of all four pulmonary veins with RF current, WACA approach used, additional LA ablation performed with a standard box lesion created along the posterior wall of the LA ECHO 03/2023 > LVEF 55%, LA mildly dilated, mild MV regurgitation, mild AV regurgitation CT Cardiac Morphology 08/08/2023 > normal pulmonary vein drainage into the LA, no PFO/ASD, normal coronary origin, CAC of 1330 / 94th percentile for matched controls  EPS 08/21/2023 > SR on presentation, successful electrical isolation & anatomical encircling of all four pulmonary veins with PF, ablation of the posterior wall with PF Cardiac Monitor 11/19/23 > HR min 64 bpm, max 125 bpm, ave 82 bpm, predominant underlying was NSR, rare SVE's <1%,  isolated VE's rare <1%, no sustained arrhythmias, symptom trigger episodes correspond to NSR   Arrhythmia / AAD AF > initial dx ~ 2015  Flecainide  > stopped in 2022 with breakthrough episodes of AF Amiodarone  > stopped 12/19/23 post ablation   Risk Assessment/Calculations:    CHA2DS2-VASc Score = 2   This indicates a 2.2% annual risk of stroke. The patient's score is based upon: CHF History: 0 HTN History: 0 Diabetes History: 0 Stroke History: 0 Vascular Disease History: 1 Age Score: 1 Gender Score: 0             Physical Exam:   VS:  BP 128/80   Pulse 76   Ht 6' 1.5" (1.867 m)   Wt 250 lb (113.4 kg)   SpO2 94%   BMI 32.54 kg/m    Wt Readings from Last 3 Encounters:  12/19/23 250 lb (113.4 kg)  12/02/23 257 lb 12.8 oz (116.9 kg)  11/19/23 253 lb 3.2 oz (114.9 kg)     GEN: Well nourished, well developed in no acute distress NECK: No JVD; No carotid bruits CARDIAC: Regular rate and rhythm, no murmurs, rubs, gallops RESPIRATORY:  Clear to auscultation without rales, wheezing or rhonchi  ABDOMEN: Soft, non-tender, non-distended EXTREMITIES:  No edema; No deformity   ASSESSMENT AND PLAN:    Persistent Atrial Fibrillation  Palpitations CHA2DS2-VASc 2, s/p redo ablation 08/21/23  -continue OAC for stroke prophylaxis  -reduce diltiazem  to 120 mg daily  -stop amiodarone ,  previously hesitant to stop amiodarone  > cardiac monitor worn and no AF, now  confirms that he recognizes symptoms were related to acid reflux -monitors with [facility] and no AF since ablation   Secondary Hypercoagulable State  -continue Eliquis  5mg  BID, dose reviewed and appropriate by age / wt   Elevated CAC Score  HLD Aortic Dilation  -per Cardiology  -next appt with Cardiology 03/11/24 with Dr. Renna Cary   Possible GERD / Acid Reflux  -weight loss and not eating late at night resolved prior symptoms  Pre-Operative Surgical Clearance Request for Surgical Clearance Procedure:  RECONSTRUCTION  OF EYELID, FULL THICKNESS BY TRANSFER OF TARSOCONJUNCTIVAL FLAP FROM OPPOSING EYELID; TOTAL EYELID, LOWER 1 STAGE OR FIRST STAGE. FULL THICKNESS GRAFT   Date of Surgery:  Clearance 01/07/24 (procedure was re-scheduled from 10/15/23 due to ablation)                             Surgeon:  DR. Concha Deed ANN WOODWARD Surgeon's Group or Practice Name:  [facility] Phone number:  [phone] Fax number:  [phone]   Type of Clearance Requested:   - Medical  - Pharmacy:  Hold Apixaban  (Eliquis )     Type of Anesthesia:  General   Click Here to Calculate RCRI      :[id number]}   Jorge Kelly perioperative risk of a major cardiac event is 0.9% according to the Revised Cardiac Risk Index (RCRI).  Therefore, he is at low risk for perioperative complications.      Recommendations: According to ACC/AHA guidelines, no further cardiovascular testing needed.  The patient may proceed to surgery at acceptable risk.    Antiplatelet and/or Anticoagulation Recommendations:  Eliquis  (Apixaban ) can be held for 1-2 days prior to surgery.  Please resume post op when felt to be safe.   Reviewed with Pharmacy 12/19/23 in clinic, but will forward back for review.      Follow up with Dr. Lawana Pray or EP APP in 6 months  Signed, Creighton Doffing, NP-C, AGACNP-BC [facility] - Electrophysiology  12/19/2023, 9:37 AM

## 2023-12-19 ENCOUNTER — Encounter: Payer: Self-pay | Admitting: Pulmonary Disease

## 2023-12-19 ENCOUNTER — Ambulatory Visit: Attending: Pulmonary Disease | Admitting: Pulmonary Disease

## 2023-12-19 ENCOUNTER — Other Ambulatory Visit: Payer: Self-pay | Admitting: Family Medicine

## 2023-12-19 VITALS — BP 128/80 | HR 76 | Ht 73.5 in | Wt 250.0 lb

## 2023-12-19 DIAGNOSIS — R002 Palpitations: Secondary | ICD-10-CM

## 2023-12-19 DIAGNOSIS — I2510 Atherosclerotic heart disease of native coronary artery without angina pectoris: Secondary | ICD-10-CM | POA: Diagnosis not present

## 2023-12-19 DIAGNOSIS — D6869 Other thrombophilia: Secondary | ICD-10-CM

## 2023-12-19 DIAGNOSIS — I4819 Other persistent atrial fibrillation: Secondary | ICD-10-CM

## 2023-12-19 MED ORDER — DILTIAZEM HCL ER COATED BEADS 120 MG PO CP24: 120.0000 mg | ORAL_CAPSULE | Freq: Every day | ORAL | 3 refills | Status: AC

## 2023-12-19 NOTE — Telephone Encounter (Signed)
 Patient with diagnosis of Afib on Eliquis  for anticoagulation.     Procedure: eyelid reconstruction procedure with tarsoconjunctival flap  Date of procedure: 10/15/23     CHA2DS2-VASc Score = 2   This indicates a 2.2% annual risk of stroke. The patient's score is based upon: CHF History: 0 HTN History: 0 Diabetes History: 0 Stroke History: 0 Vascular Disease History: 1 Age Score: 1 Gender Score: 0       Pt had ablation on 08/21/23 uninterrupted anticoagulation recommended 3 months post ablation. Ok to hold DOAC anytime after 11/19/23 for 1-2 days prior to the procedure .     **This guidance is not considered finalized until pre-operative APP has relayed final recommendations.**

## 2023-12-19 NOTE — Patient Instructions (Signed)
 Medication Instructions:  STOP AMIODARONE   DECREASE CARDIZEM  TO 120 MG DAILY   *If you need a refill on your cardiac medications before your next appointment, please call your pharmacy*  Lab Work: NO LABS If you have labs (blood work) drawn today and your tests are completely normal, you will receive your results only by: MyChart Message (if you have MyChart) OR A paper copy in the mail If you have any lab test that is abnormal or we need to change your treatment, we will call you to review the results.  Testing/Procedures: NO TESTING  Follow-Up: At [facility], you and your health needs are our priority.  As part of our continuing mission to provide you with exceptional heart care, our providers are all part of one team.  This team includes your primary Cardiologist (physician) and Advanced Practice Providers or APPs (Physician Assistants and Nurse Practitioners) who all work together to provide you with the care you need, when you need it.  Your next appointment:   6 month(s)  Provider:   Creighton Doffing, NP

## 2024-01-01 ENCOUNTER — Telehealth: Payer: Self-pay | Admitting: Cardiology

## 2024-01-01 NOTE — Telephone Encounter (Signed)
   Patient Name: Jorge Kelly  DOB: 07-02-1956 MRN: [id number]  Primary Cardiologist: Christoper Crafts, MD  Patient seen by Creighton Doffing, NP on 12/19/23. At time of visit, Mr. Vandervelden perioperative risk of a major cardiac event deemed to be 0.9% according to the Revised Cardiac Risk Index (RCRI).  Therefore, he is at low risk for perioperative complications.       Recommendations: According to ACC/AHA guidelines, no further cardiovascular testing needed.  The patient may proceed to surgery at acceptable risk.     Antiplatelet and/or Anticoagulation Recommendations:   Eliquis  (Apixaban ) can be held for 1-2 days prior to surgery.  Please resume post op when felt to be safe.   If he develops new symptoms prior to surgery, he should contact our office to arrange for a follow-up visit.  I will route this recommendation to the requesting party via Epic fax function and remove from pre-op pool.  Please call with questions.  Leala Prince, PA-C 01/01/2024, 11:36 AM

## 2024-01-01 NOTE — Telephone Encounter (Signed)
-----   Message from Melissa D Maccia sent at 01/01/2024 11:16 AM EDT ----- He can hold Eliquis  for 2 days prior to procedure ----- Message ----- From: Leala Prince, PA-C Sent: 01/01/2024   9:51 AM EDT To: [facility] Preop Pharm  Pharmacy please advise on holding Eliquis  prior to RECONSTRUCTION OF EYELID, FULL THICKNESS BY TRANSFER OF TARSOCONJUNCTIVAL FLAP FROM OPPOSING EYELID; TOTAL EYELID, LOWER 1 STAGE OR FIRST STAGE. FULL THICKNESS GRAFT scheduled for 01/07/24. Appears that Creighton Doffing, NP discussed with pharmacy in clinic but advised for confirmation.

## 2024-01-14 ENCOUNTER — Other Ambulatory Visit: Payer: Self-pay | Admitting: *Deleted

## 2024-01-14 DIAGNOSIS — I480 Paroxysmal atrial fibrillation: Secondary | ICD-10-CM

## 2024-01-14 DIAGNOSIS — D6869 Other thrombophilia: Secondary | ICD-10-CM

## 2024-01-14 DIAGNOSIS — E782 Mixed hyperlipidemia: Secondary | ICD-10-CM

## 2024-01-14 DIAGNOSIS — I77819 Aortic ectasia, unspecified site: Secondary | ICD-10-CM

## 2024-01-14 DIAGNOSIS — Z79899 Other long term (current) drug therapy: Secondary | ICD-10-CM

## 2024-01-14 DIAGNOSIS — I10 Essential (primary) hypertension: Secondary | ICD-10-CM

## 2024-01-14 DIAGNOSIS — E785 Hyperlipidemia, unspecified: Secondary | ICD-10-CM

## 2024-01-14 DIAGNOSIS — I2510 Atherosclerotic heart disease of native coronary artery without angina pectoris: Secondary | ICD-10-CM

## 2024-01-14 DIAGNOSIS — Z8249 Family history of ischemic heart disease and other diseases of the circulatory system: Secondary | ICD-10-CM

## 2024-01-14 MED ORDER — ROSUVASTATIN CALCIUM 20 MG PO TABS: 20.0000 mg | ORAL_TABLET | Freq: Every day | ORAL | 0 refills | Status: DC

## 2024-01-21 ENCOUNTER — Encounter: Payer: Self-pay | Admitting: Urology

## 2024-01-21 ENCOUNTER — Other Ambulatory Visit: Payer: Self-pay | Admitting: Urology

## 2024-01-21 DIAGNOSIS — R972 Elevated prostate specific antigen [PSA]: Secondary | ICD-10-CM

## 2024-01-23 ENCOUNTER — Telehealth: Payer: Self-pay | Admitting: Pharmacist

## 2024-01-23 ENCOUNTER — Ambulatory Visit: Attending: Cardiovascular Disease | Admitting: Pharmacist

## 2024-01-23 ENCOUNTER — Other Ambulatory Visit (HOSPITAL_COMMUNITY): Payer: Self-pay

## 2024-01-23 ENCOUNTER — Encounter: Payer: Self-pay | Admitting: Pharmacist

## 2024-01-23 ENCOUNTER — Telehealth: Payer: Self-pay | Admitting: Pharmacy Technician

## 2024-01-23 DIAGNOSIS — R931 Abnormal findings on diagnostic imaging of heart and coronary circulation: Secondary | ICD-10-CM | POA: Insufficient documentation

## 2024-01-23 DIAGNOSIS — I2510 Atherosclerotic heart disease of native coronary artery without angina pectoris: Secondary | ICD-10-CM | POA: Diagnosis not present

## 2024-01-23 DIAGNOSIS — E7841 Elevated Lipoprotein(a): Secondary | ICD-10-CM

## 2024-01-23 DIAGNOSIS — E785 Hyperlipidemia, unspecified: Secondary | ICD-10-CM

## 2024-01-23 DIAGNOSIS — C44519 Basal cell carcinoma of skin of other part of trunk: Secondary | ICD-10-CM | POA: Insufficient documentation

## 2024-01-23 DIAGNOSIS — L918 Other hypertrophic disorders of the skin: Secondary | ICD-10-CM | POA: Insufficient documentation

## 2024-01-23 NOTE — Telephone Encounter (Signed)
 Pharmacy Patient Advocate Encounter   Received notification from Pt Calls Messages that prior authorization for Repatha is required/requested.   Insurance verification completed.   The patient is insured through [employer] .   Per test claim: The current 01/23/24 day co-pay is, $50.00- one month.  No PA needed at this time. This test claim was processed through [facility]- copay amounts may vary at other pharmacies due to pharmacy/plan contracts, or as the patient moves through the different stages of their insurance plan.

## 2024-01-23 NOTE — Patient Instructions (Addendum)
 It was nice meeting you today  We would like your LDL (bad cholesterol) to be less than 70 and your LPA to be less than 75  Please continue your rosuvastatin  20mg  once a day  The medication we discussed is called Repatha, which is an injection you would administer once every 2 weeks  I will complete the prior authorization for you and contact you with the result  If approved, we would recheck your lab work in about 3 months  Let me know if you have any questions  Joelene Murrain, PharmD, BCACP, CDCES, CPP [facility] [address], [place], [place] [address] Phone: [phone]; Fax: [phone] 01/23/2024 8:35 AM

## 2024-01-23 NOTE — Telephone Encounter (Signed)
 Please complete PA for Repatha

## 2024-01-23 NOTE — Progress Notes (Signed)
 Patient ID: KONG PACKETT                 DOB: June 11, 1956                    MRN: [id number]     HPI: Jorge Kelly is a 68 y.o. male patient referred to lipid clinic by PCP Dr Arlene Ben. PMH is significant for PAF, CAD, elevated coronary calcium  score, and elevated LPA.  Patient presents today to discuss lipid management. Currently on rosuvastatin  20mg  with no reported adverse effects.  Exercises often and has been working on diet changes. Currently on Zepbound  and is comfortable with home injections.   Coronary CT in 2022 showed high levels of coronary calcium  and recent labs showed elevated LPA. Patient reports he has a family history of heart disease on his mothers side.  Current Medications:  Rosuvastatin  20mg  daily  Intolerances: N/A  Risk Factors:  Coronary calcium  Elevated LPA Family history  LDL goal: <70  Labs: LPA 117, TC 113, Trigs 90, HDL 46, LDL 49 (12/02/23)  Imaging: MPRESSION: 1. There is normal pulmonary vein drainage into the left atrium with ostial measurements above.   2. There is no thrombus in the left atrial appendage.   3. The esophagus runs in the left atrial midline and is not in proximity to any of the pulmonary vein ostia.   4. No PFO/ASD.   5. Normal coronary origin. Right dominance.   6. CAC score of 1330 which is 94th percentile for age-, race-, and sex-matched controls.  Past Medical History:  Diagnosis Date   Actinic keratosis 10/24/2009   ALLERGIC RHINITIS 05/11/2007   ELEVATED BLOOD PRESSURE WITHOUT DIAGNOSIS OF HYPERTENSION 11/12/2007   Dr. Maximo Spar mentioned HTN on problem list   Esophageal stricture    treated with protonix    GERD 05/11/2007   GI bleeding    20 years ago   Kidney stone    Melanoma (HCC) 01/2014   remoted from forehead   MIGRAINE HEADACHE 05/11/2007   Overweight    Persistent atrial fibrillation (HCC) 05/2014   Snores     Current Outpatient Medications on File Prior to Visit  Medication Sig Dispense Refill    acetaminophen  (TYLENOL ) 500 MG tablet Take 1,000 mg by mouth every 6 (six) hours as needed for moderate pain or headache.     apixaban  (ELIQUIS ) 5 MG TABS tablet Take 1 tablet (5 mg total) by mouth 2 (two) times daily. 180 tablet 3   diltiazem  (CARDIZEM  CD) 120 MG 24 hr capsule Take 1 capsule (120 mg total) by mouth daily. 90 capsule 3   fexofenadine (ALLEGRA) 180 MG tablet Take 180 mg by mouth daily as needed for allergies or rhinitis.     rizatriptan  (MAXALT -MLT) 10 MG disintegrating tablet TAKE DAILY AS NEEDED FOR MIGRAINE MAY REPEAT IN 2 HOURS IF NEEDED X1 9 tablet 2   rosuvastatin  (CRESTOR ) 20 MG tablet Take 1 tablet (20 mg total) by mouth daily. 90 tablet 0   tirzepatide  (ZEPBOUND ) 2.5 MG/0.5ML Pen Inject 2.5 mg into the skin once a week. 2 mL 5   No current facility-administered medications on file prior to visit.    No Known Allergies  Assessment/Plan:  1. Hyperlipidemia - Patient LDL well controlled at 49. However LPA 117 which is above goal of <75. Combined with elevated coronary calcium  score of 1330 with family history puts patient at elevated risk of adverse coronary events. Recommend addition of PCSK9i or inclisiran. Patinet prefers to  try Pcsk9i.  Using demo pen, educated patient on mechanism of action, storage, site selection, administration, and possible adverse effects. Will submit PA and contact patient with response. If approved, will recheck FLP and LPA in 3 months.  Continue rosuvastatin  20mg  daily Start PCSK9i q 2 weeks Recheck lipid panel in 3 months  Chris Greysen Devino, PharmD, BCACP, CDCES, CPP [facility] [address] [place], [place] [address] Phone: [phone]; Fax: [phone] 01/23/2024 8:53 AM

## 2024-01-31 ENCOUNTER — Other Ambulatory Visit: Payer: Self-pay | Admitting: Pharmacist

## 2024-01-31 ENCOUNTER — Other Ambulatory Visit (HOSPITAL_COMMUNITY): Payer: Self-pay

## 2024-01-31 DIAGNOSIS — R931 Abnormal findings on diagnostic imaging of heart and coronary circulation: Secondary | ICD-10-CM

## 2024-01-31 DIAGNOSIS — I2510 Atherosclerotic heart disease of native coronary artery without angina pectoris: Secondary | ICD-10-CM

## 2024-01-31 DIAGNOSIS — E7841 Elevated Lipoprotein(a): Secondary | ICD-10-CM

## 2024-01-31 DIAGNOSIS — E785 Hyperlipidemia, unspecified: Secondary | ICD-10-CM

## 2024-01-31 MED ORDER — REPATHA SURECLICK 140 MG/ML ~~LOC~~ SOAJ
1.0000 mL | SUBCUTANEOUS | 11 refills | Status: AC
  Filled 2024-01-31 – 2024-02-21 (×2): qty 2, 28d supply, fill #0
  Filled 2024-05-04 – 2024-06-07 (×2): qty 2, 28d supply, fill #1
  Filled 2024-07-13: qty 2, 28d supply, fill #2
  Filled 2024-08-20: qty 2, 28d supply, fill #3

## 2024-02-11 ENCOUNTER — Other Ambulatory Visit (HOSPITAL_COMMUNITY): Payer: Self-pay

## 2024-02-19 ENCOUNTER — Emergency Department (HOSPITAL_COMMUNITY)

## 2024-02-19 ENCOUNTER — Other Ambulatory Visit: Payer: Self-pay

## 2024-02-19 ENCOUNTER — Emergency Department (HOSPITAL_COMMUNITY): Admitting: Anesthesiology

## 2024-02-19 ENCOUNTER — Ambulatory Visit: Payer: Self-pay | Admitting: *Deleted

## 2024-02-19 ENCOUNTER — Ambulatory Visit: Admitting: Physician Assistant

## 2024-02-19 ENCOUNTER — Encounter (HOSPITAL_COMMUNITY)
Admission: EM | Disposition: A | Discharge status: Home / Self Care | Payer: Self-pay | Source: Home / Self Care | Attending: Surgery

## 2024-02-19 ENCOUNTER — Encounter (HOSPITAL_COMMUNITY): Payer: Self-pay | Admitting: *Deleted

## 2024-02-19 ENCOUNTER — Inpatient Hospital Stay (HOSPITAL_COMMUNITY)
Admission: EM | Admit: 2024-02-19 | Discharge: 2024-02-21 | DRG: 253 | Disposition: A | Discharge status: Home / Self Care | Attending: Surgery | Admitting: Surgery

## 2024-02-19 ENCOUNTER — Encounter: Payer: Self-pay | Admitting: Family Medicine

## 2024-02-19 DIAGNOSIS — Z7985 Long-term (current) use of injectable non-insulin antidiabetic drugs: Secondary | ICD-10-CM | POA: Diagnosis not present

## 2024-02-19 DIAGNOSIS — L570 Actinic keratosis: Secondary | ICD-10-CM | POA: Diagnosis present

## 2024-02-19 DIAGNOSIS — K469 Unspecified abdominal hernia without obstruction or gangrene: Secondary | ICD-10-CM | POA: Diagnosis not present

## 2024-02-19 DIAGNOSIS — I10 Essential (primary) hypertension: Secondary | ICD-10-CM | POA: Diagnosis present

## 2024-02-19 DIAGNOSIS — Z8249 Family history of ischemic heart disease and other diseases of the circulatory system: Secondary | ICD-10-CM | POA: Diagnosis not present

## 2024-02-19 DIAGNOSIS — R1030 Lower abdominal pain, unspecified: Secondary | ICD-10-CM | POA: Diagnosis present

## 2024-02-19 DIAGNOSIS — J309 Allergic rhinitis, unspecified: Secondary | ICD-10-CM | POA: Diagnosis present

## 2024-02-19 DIAGNOSIS — Z85820 Personal history of malignant melanoma of skin: Secondary | ICD-10-CM | POA: Diagnosis not present

## 2024-02-19 DIAGNOSIS — E7800 Pure hypercholesterolemia, unspecified: Secondary | ICD-10-CM | POA: Diagnosis present

## 2024-02-19 DIAGNOSIS — Z7901 Long term (current) use of anticoagulants: Secondary | ICD-10-CM | POA: Diagnosis not present

## 2024-02-19 DIAGNOSIS — T81718A Complication of other artery following a procedure, not elsewhere classified, initial encounter: Principal | ICD-10-CM | POA: Diagnosis present

## 2024-02-19 DIAGNOSIS — Z87442 Personal history of urinary calculi: Secondary | ICD-10-CM | POA: Diagnosis not present

## 2024-02-19 DIAGNOSIS — K5900 Constipation, unspecified: Secondary | ICD-10-CM | POA: Diagnosis present

## 2024-02-19 DIAGNOSIS — K219 Gastro-esophageal reflux disease without esophagitis: Secondary | ICD-10-CM | POA: Diagnosis present

## 2024-02-19 DIAGNOSIS — Z8719 Personal history of other diseases of the digestive system: Secondary | ICD-10-CM | POA: Diagnosis not present

## 2024-02-19 DIAGNOSIS — I4819 Other persistent atrial fibrillation: Secondary | ICD-10-CM | POA: Diagnosis present

## 2024-02-19 DIAGNOSIS — Z833 Family history of diabetes mellitus: Secondary | ICD-10-CM | POA: Diagnosis not present

## 2024-02-19 DIAGNOSIS — I724 Aneurysm of artery of lower extremity: Principal | ICD-10-CM

## 2024-02-19 DIAGNOSIS — Z806 Family history of leukemia: Secondary | ICD-10-CM

## 2024-02-19 DIAGNOSIS — I729 Aneurysm of unspecified site: Principal | ICD-10-CM | POA: Diagnosis present

## 2024-02-19 DIAGNOSIS — Z9889 Other specified postprocedural states: Secondary | ICD-10-CM | POA: Diagnosis not present

## 2024-02-19 DIAGNOSIS — Z79899 Other long term (current) drug therapy: Secondary | ICD-10-CM | POA: Diagnosis not present

## 2024-02-19 HISTORY — PX: APPLICATION OF WOUND VAC: SHX5189

## 2024-02-19 HISTORY — PX: FALSE ANEURYSM REPAIR: SHX5152

## 2024-02-19 LAB — URINALYSIS, ROUTINE W REFLEX MICROSCOPIC
Bilirubin Urine: NEGATIVE
Glucose, UA: NEGATIVE mg/dL
Hgb urine dipstick: NEGATIVE
Ketones, ur: NEGATIVE mg/dL
Leukocytes,Ua: NEGATIVE
Nitrite: NEGATIVE
Protein, ur: NEGATIVE mg/dL
Specific Gravity, Urine: 1.02 (ref 1.005–1.030)
pH: 5 (ref 5.0–8.0)

## 2024-02-19 LAB — CBC WITH DIFFERENTIAL/PLATELET
Abs Immature Granulocytes: 0.04 K/uL (ref 0.00–0.07)
Basophils Absolute: 0 K/uL (ref 0.0–0.1)
Basophils Relative: 0 %
Eosinophils Absolute: 0.2 K/uL (ref 0.0–0.5)
Eosinophils Relative: 2 %
HCT: 43.9 % (ref 39.0–52.0)
Hemoglobin: 14.8 g/dL (ref 13.0–17.0)
Immature Granulocytes: 1 %
Lymphocytes Relative: 16 %
Lymphs Abs: 1.3 K/uL (ref 0.7–4.0)
MCH: 32 pg (ref 26.0–34.0)
MCHC: 33.7 g/dL (ref 30.0–36.0)
MCV: 94.8 fL (ref 80.0–100.0)
Monocytes Absolute: 0.6 K/uL (ref 0.1–1.0)
Monocytes Relative: 7 %
Neutro Abs: 6.1 K/uL (ref 1.7–7.7)
Neutrophils Relative %: 74 %
Platelets: 163 K/uL (ref 150–400)
RBC: 4.63 MIL/uL (ref 4.22–5.81)
RDW: 13.1 % (ref 11.5–15.5)
WBC: 8.1 K/uL (ref 4.0–10.5)
nRBC: 0 % (ref 0.0–0.2)

## 2024-02-19 LAB — COMPREHENSIVE METABOLIC PANEL WITH GFR
ALT: 11 U/L (ref 0–44)
AST: 12 U/L — ABNORMAL LOW (ref 15–41)
Albumin: 4 g/dL (ref 3.5–5.0)
Alkaline Phosphatase: 75 U/L (ref 38–126)
Anion gap: 10 (ref 5–15)
BUN: 12 mg/dL (ref 8–23)
CO2: 22 mmol/L (ref 22–32)
Calcium: 9 mg/dL (ref 8.9–10.3)
Chloride: 107 mmol/L (ref 98–111)
Creatinine, Ser: 1.03 mg/dL (ref 0.61–1.24)
GFR, Estimated: >60 mL/min (ref >60)
Glucose, Bld: 93 mg/dL (ref 70–99)
Potassium: 4.1 mmol/L (ref 3.5–5.1)
Sodium: 139 mmol/L (ref 135–145)
Total Bilirubin: 1.3 mg/dL — ABNORMAL HIGH (ref 0.0–1.2)
Total Protein: 6.6 g/dL (ref 6.5–8.1)

## 2024-02-19 LAB — TYPE AND SCREEN
ABO/RH(D): AB POS
Antibody Screen: NEGATIVE

## 2024-02-19 LAB — SURGICAL PCR SCREEN
MRSA, PCR: NEGATIVE
Staphylococcus aureus: NEGATIVE

## 2024-02-19 LAB — LIPASE, BLOOD: Lipase: 40 U/L (ref 11–51)

## 2024-02-19 LAB — ABO/RH: ABO/RH(D): AB POS

## 2024-02-19 LAB — I-STAT CG4 LACTIC ACID, ED: Lactic Acid, Venous: 0.7 mmol/L (ref 0.5–1.9)

## 2024-02-19 SURGERY — REPAIR, PSEUDOANEURYSM
Anesthesia: General | Site: Groin | Laterality: Left

## 2024-02-19 MED ORDER — HEPARIN SODIUM (PORCINE) 1000 UNIT/ML IJ SOLN
INTRAMUSCULAR | Status: AC
  Filled 2024-02-19: qty 10

## 2024-02-19 MED ORDER — ONDANSETRON HCL 4 MG/2ML IJ SOLN
4.0000 mg | Freq: Once | INTRAMUSCULAR | Status: AC
  Administered 2024-02-19: 4 mg via INTRAVENOUS
  Filled 2024-02-19: qty 2

## 2024-02-19 MED ORDER — CEFAZOLIN SODIUM-DEXTROSE 2-3 GM-%(50ML) IV SOLR
INTRAVENOUS | Status: DC | PRN
  Administered 2024-02-19: 2 g via INTRAVENOUS

## 2024-02-19 MED ORDER — SUGAMMADEX SODIUM 200 MG/2ML IV SOLN
INTRAVENOUS | Status: AC
  Filled 2024-02-19: qty 2

## 2024-02-19 MED ORDER — SODIUM CHLORIDE 0.9 % IV SOLN
500.0000 mL | Freq: Once | INTRAVENOUS | Status: DC | PRN
Start: 2024-02-19 — End: 2024-02-21

## 2024-02-19 MED ORDER — CHLORHEXIDINE GLUCONATE 0.12 % MT SOLN
OROMUCOSAL | Status: AC
  Administered 2024-02-19: 15 mL via OROMUCOSAL
  Filled 2024-02-19: qty 15

## 2024-02-19 MED ORDER — HYDROMORPHONE HCL 1 MG/ML IJ SOLN
0.2500 mg | INTRAMUSCULAR | Status: DC | PRN
  Administered 2024-02-19: 0.5 mg via INTRAVENOUS

## 2024-02-19 MED ORDER — HYDRALAZINE HCL 20 MG/ML IJ SOLN: 5.0000 mg | INTRAMUSCULAR | Status: DC | PRN

## 2024-02-19 MED ORDER — LABETALOL HCL 5 MG/ML IV SOLN: 10.0000 mg | INTRAVENOUS | Status: DC | PRN

## 2024-02-19 MED ORDER — HEMOSTATIC AGENTS (NO CHARGE) OPTIME
TOPICAL | Status: DC | PRN
  Administered 2024-02-19: 1 via TOPICAL

## 2024-02-19 MED ORDER — SUGAMMADEX SODIUM 200 MG/2ML IV SOLN
INTRAVENOUS | Status: AC
Start: 2024-02-19 — End: 2024-02-19
  Filled 2024-02-19: qty 2

## 2024-02-19 MED ORDER — PROTHROMBIN COMPLEX CONC HUMAN 500 UNITS IV KIT
2865.0000 [IU] | PACK | Status: AC
  Administered 2024-02-19: 2865 [IU] via INTRAVENOUS
  Filled 2024-02-19: qty 2865

## 2024-02-19 MED ORDER — TIRZEPATIDE-WEIGHT MANAGEMENT 2.5 MG/0.5ML ~~LOC~~ SOAJ: 2.5000 mg | SUBCUTANEOUS | Status: DC

## 2024-02-19 MED ORDER — ACETAMINOPHEN 10 MG/ML IV SOLN
INTRAVENOUS | Status: AC
  Filled 2024-02-19: qty 100

## 2024-02-19 MED ORDER — HEPARIN SODIUM (PORCINE) 1000 UNIT/ML IJ SOLN
INTRAMUSCULAR | Status: DC | PRN
  Administered 2024-02-19: 10000 [IU] via INTRAVENOUS

## 2024-02-19 MED ORDER — ACETAMINOPHEN 650 MG RE SUPP: 325.0000 mg | RECTAL | Status: DC | PRN

## 2024-02-19 MED ORDER — LIDOCAINE 2% (20 MG/ML) 5 ML SYRINGE
INTRAMUSCULAR | Status: AC
Start: 2024-02-19 — End: 2024-02-19
  Filled 2024-02-19: qty 5

## 2024-02-19 MED ORDER — PROPOFOL 10 MG/ML IV BOLUS
INTRAVENOUS | Status: AC
  Filled 2024-02-19: qty 20

## 2024-02-19 MED ORDER — DEXAMETHASONE SODIUM PHOSPHATE 10 MG/ML IJ SOLN
INTRAMUSCULAR | Status: AC
Start: 2024-02-19 — End: 2024-02-19
  Filled 2024-02-19: qty 1

## 2024-02-19 MED ORDER — ONDANSETRON HCL 4 MG/2ML IJ SOLN
INTRAMUSCULAR | Status: AC
  Filled 2024-02-19: qty 2

## 2024-02-19 MED ORDER — ACETAMINOPHEN 325 MG PO TABS
325.0000 mg | ORAL_TABLET | ORAL | Status: DC | PRN
  Administered 2024-02-20: 650 mg via ORAL
  Filled 2024-02-19: qty 2

## 2024-02-19 MED ORDER — MORPHINE SULFATE (PF) 2 MG/ML IV SOLN: 2.0000 mg | INTRAVENOUS | Status: DC | PRN

## 2024-02-19 MED ORDER — DILTIAZEM HCL ER COATED BEADS 120 MG PO CP24
120.0000 mg | ORAL_CAPSULE | Freq: Every day | ORAL | Status: DC
  Administered 2024-02-20 – 2024-02-21 (×2): 120 mg via ORAL
  Filled 2024-02-19 (×2): qty 1

## 2024-02-19 MED ORDER — PROPOFOL 10 MG/ML IV BOLUS
INTRAVENOUS | Status: DC | PRN
  Administered 2024-02-19: 200 mg via INTRAVENOUS
  Administered 2024-02-19: 50 mg via INTRAVENOUS

## 2024-02-19 MED ORDER — DOCUSATE SODIUM 100 MG PO CAPS
100.0000 mg | ORAL_CAPSULE | Freq: Every day | ORAL | Status: DC
  Administered 2024-02-20 – 2024-02-21 (×2): 100 mg via ORAL
  Filled 2024-02-19 (×2): qty 1

## 2024-02-19 MED ORDER — ROCURONIUM BROMIDE 10 MG/ML (PF) SYRINGE
PREFILLED_SYRINGE | INTRAVENOUS | Status: DC | PRN
  Administered 2024-02-19: 20 mg via INTRAVENOUS
  Administered 2024-02-19: 10 mg via INTRAVENOUS
  Administered 2024-02-19: 80 mg via INTRAVENOUS

## 2024-02-19 MED ORDER — ACETAMINOPHEN 10 MG/ML IV SOLN
INTRAVENOUS | Status: DC | PRN
Start: 2024-02-19 — End: 2024-02-19
  Administered 2024-02-19: 1000 mg via INTRAVENOUS

## 2024-02-19 MED ORDER — METOPROLOL TARTRATE 5 MG/5ML IV SOLN: 2.5000 mg | INTRAVENOUS | Status: DC | PRN

## 2024-02-19 MED ORDER — MORPHINE SULFATE (PF) 4 MG/ML IV SOLN
4.0000 mg | Freq: Once | INTRAVENOUS | Status: AC
  Administered 2024-02-19: 4 mg via INTRAVENOUS
  Filled 2024-02-19: qty 1

## 2024-02-19 MED ORDER — 0.9 % SODIUM CHLORIDE (POUR BTL) OPTIME
TOPICAL | Status: DC | PRN
  Administered 2024-02-19: 1000 mL

## 2024-02-19 MED ORDER — ALBUMIN HUMAN 5 % IV SOLN: INTRAVENOUS | Status: DC | PRN

## 2024-02-19 MED ORDER — MORPHINE SULFATE (PF) 4 MG/ML IV SOLN: 4.0000 mg | Freq: Once | INTRAVENOUS | Status: DC

## 2024-02-19 MED ORDER — FENTANYL CITRATE (PF) 250 MCG/5ML IJ SOLN
INTRAMUSCULAR | Status: AC
  Filled 2024-02-19: qty 5

## 2024-02-19 MED ORDER — CEFAZOLIN SODIUM-DEXTROSE 2-4 GM/100ML-% IV SOLN
2.0000 g | Freq: Three times a day (TID) | INTRAVENOUS | Status: DC
  Administered 2024-02-20: 2 g via INTRAVENOUS
  Filled 2024-02-19: qty 100

## 2024-02-19 MED ORDER — DIAZEPAM 5 MG/ML IJ SOLN
5.0000 mg | Freq: Once | INTRAMUSCULAR | Status: AC
  Administered 2024-02-19: 5 mg via INTRAVENOUS
  Filled 2024-02-19: qty 2

## 2024-02-19 MED ORDER — SODIUM CHLORIDE 0.9 % IV BOLUS
1000.0000 mL | Freq: Once | INTRAVENOUS | Status: AC
  Administered 2024-02-19: 1000 mL via INTRAVENOUS

## 2024-02-19 MED ORDER — OXYCODONE-ACETAMINOPHEN 5-325 MG PO TABS: 1.0000 | ORAL_TABLET | ORAL | Status: DC | PRN

## 2024-02-19 MED ORDER — HEPARIN 6000 UNIT IRRIGATION SOLUTION
Status: DC | PRN
Start: 2024-02-19 — End: 2024-02-19
  Administered 2024-02-19: 1

## 2024-02-19 MED ORDER — CHLORHEXIDINE GLUCONATE 0.12 % MT SOLN
15.0000 mL | Freq: Once | OROMUCOSAL | Status: AC
  Filled 2024-02-19: qty 15

## 2024-02-19 MED ORDER — LORATADINE 10 MG PO TABS
10.0000 mg | ORAL_TABLET | Freq: Every day | ORAL | Status: DC
  Administered 2024-02-20 – 2024-02-21 (×2): 10 mg via ORAL
  Filled 2024-02-19 (×2): qty 1

## 2024-02-19 MED ORDER — FENTANYL CITRATE (PF) 250 MCG/5ML IJ SOLN
INTRAMUSCULAR | Status: DC | PRN
  Administered 2024-02-19 (×5): 50 ug via INTRAVENOUS

## 2024-02-19 MED ORDER — DEXAMETHASONE SODIUM PHOSPHATE 10 MG/ML IJ SOLN
INTRAMUSCULAR | Status: DC | PRN
  Administered 2024-02-19: 10 mg via INTRAVENOUS

## 2024-02-19 MED ORDER — LACTATED RINGERS IV SOLN: INTRAVENOUS | Status: DC

## 2024-02-19 MED ORDER — HYDROMORPHONE HCL 1 MG/ML IJ SOLN
INTRAMUSCULAR | Status: AC
  Filled 2024-02-19: qty 1

## 2024-02-19 MED ORDER — ROSUVASTATIN CALCIUM 20 MG PO TABS
20.0000 mg | ORAL_TABLET | Freq: Every day | ORAL | Status: DC
  Administered 2024-02-20 – 2024-02-21 (×2): 20 mg via ORAL
  Filled 2024-02-19 (×2): qty 1

## 2024-02-19 MED ORDER — BISACODYL 10 MG RE SUPP: 10.0000 mg | Freq: Every day | RECTAL | Status: DC | PRN

## 2024-02-19 MED ORDER — LIDOCAINE 2% (20 MG/ML) 5 ML SYRINGE
INTRAMUSCULAR | Status: DC | PRN
  Administered 2024-02-19: 20 mg via INTRAVENOUS

## 2024-02-19 MED ORDER — SUGAMMADEX SODIUM 200 MG/2ML IV SOLN
INTRAVENOUS | Status: DC | PRN
  Administered 2024-02-19: 200 mg via INTRAVENOUS

## 2024-02-19 MED ORDER — ROCURONIUM BROMIDE 10 MG/ML (PF) SYRINGE
PREFILLED_SYRINGE | INTRAVENOUS | Status: AC
  Filled 2024-02-19: qty 10

## 2024-02-19 MED ORDER — ONDANSETRON HCL 4 MG/2ML IJ SOLN: 4.0000 mg | Freq: Four times a day (QID) | INTRAMUSCULAR | Status: DC | PRN

## 2024-02-19 MED ORDER — LACTATED RINGERS IV SOLN: INTRAVENOUS | Status: AC

## 2024-02-19 MED ORDER — PROTAMINE SULFATE 10 MG/ML IV SOLN
INTRAVENOUS | Status: AC
Start: 2024-02-19 — End: 2024-02-19
  Filled 2024-02-19: qty 5

## 2024-02-19 MED ORDER — POTASSIUM CHLORIDE CRYS ER 20 MEQ PO TBCR: 40.0000 meq | EXTENDED_RELEASE_TABLET | Freq: Every day | ORAL | Status: DC | PRN

## 2024-02-19 MED ORDER — PROTAMINE SULFATE 10 MG/ML IV SOLN
INTRAVENOUS | Status: DC | PRN
  Administered 2024-02-19: 35 mg via INTRAVENOUS
  Administered 2024-02-19: 15 mg via INTRAVENOUS

## 2024-02-19 MED ORDER — SUMATRIPTAN SUCCINATE 100 MG PO TABS: 100.0000 mg | ORAL_TABLET | Freq: Once | ORAL | Status: DC | PRN

## 2024-02-19 MED ORDER — PHENYLEPHRINE HCL-NACL 20-0.9 MG/250ML-% IV SOLN
INTRAVENOUS | Status: DC | PRN
Start: 2024-02-19 — End: 2024-02-19
  Administered 2024-02-19: 40 ug/min via INTRAVENOUS

## 2024-02-19 MED ORDER — PHENOL 1.4 % MT LIQD: 1.0000 | OROMUCOSAL | Status: DC | PRN

## 2024-02-19 MED ORDER — PHENYLEPHRINE 80 MCG/ML (10ML) SYRINGE FOR IV PUSH (FOR BLOOD PRESSURE SUPPORT)
PREFILLED_SYRINGE | INTRAVENOUS | Status: DC | PRN
  Administered 2024-02-19: 80 ug via INTRAVENOUS

## 2024-02-19 MED ORDER — ORAL CARE MOUTH RINSE: 15.0000 mL | Freq: Once | OROMUCOSAL | Status: AC

## 2024-02-19 MED ORDER — ONDANSETRON HCL 4 MG/2ML IJ SOLN
INTRAMUSCULAR | Status: DC | PRN
  Administered 2024-02-19: 4 mg via INTRAVENOUS

## 2024-02-19 MED ORDER — IOHEXOL 350 MG/ML SOLN
75.0000 mL | Freq: Once | INTRAVENOUS | Status: AC | PRN
  Administered 2024-02-19: 75 mL via INTRAVENOUS

## 2024-02-19 MED ORDER — POLYETHYLENE GLYCOL 3350 17 G PO PACK: 17.0000 g | PACK | Freq: Every day | ORAL | Status: DC | PRN

## 2024-02-19 SURGICAL SUPPLY — 31 items
BAG COUNTER SPONGE SURGICOUNT (BAG) ×1 IMPLANT
CANISTER SUCTION 3000ML PPV (SUCTIONS) ×1 IMPLANT
CLIP TI MEDIUM 24 (CLIP) ×1 IMPLANT
CLIP TI WIDE RED SMALL 24 (CLIP) ×1 IMPLANT
DERMABOND ADVANCED .7 DNX12 (GAUZE/BANDAGES/DRESSINGS) ×1 IMPLANT
DRAIN CHANNEL 15F RND FF W/TCR (WOUND CARE) IMPLANT
ELECTRODE REM PT RTRN 9FT ADLT (ELECTROSURGICAL) ×1 IMPLANT
EVACUATOR SILICONE 100CC (DRAIN) IMPLANT
GLOVE SURG SS PI 7.5 STRL IVOR (GLOVE) ×3 IMPLANT
GOWN STRL REUS W/ TWL LRG LVL3 (GOWN DISPOSABLE) ×2 IMPLANT
GOWN STRL REUS W/ TWL XL LVL3 (GOWN DISPOSABLE) ×1 IMPLANT
HEMOSTAT SNOW SURGICEL 2X4 (HEMOSTASIS) IMPLANT
KIT BASIN OR (CUSTOM PROCEDURE TRAY) ×1 IMPLANT
KIT DRSG PREVENA PLUS 7DAY 125 (MISCELLANEOUS) IMPLANT
KIT PREVENA INCISION MGT 13 (CANNISTER) IMPLANT
KIT TURNOVER KIT B (KITS) ×1 IMPLANT
NS IRRIG 1000ML POUR BTL (IV SOLUTION) ×2 IMPLANT
PACK PERIPHERAL VASCULAR (CUSTOM PROCEDURE TRAY) ×1 IMPLANT
PAD ARMBOARD POSITIONER FOAM (MISCELLANEOUS) ×2 IMPLANT
SPONGE INTESTINAL PEANUT (DISPOSABLE) IMPLANT
STAPLER SKIN PROX 35W (STAPLE) IMPLANT
SURGIFLO W/THROMBIN 8M KIT (HEMOSTASIS) IMPLANT
SUT ETHILON 3 0 PS 1 (SUTURE) IMPLANT
SUT PROLENE 5 0 C 1 24 (SUTURE) ×2 IMPLANT
SUT PROLENE 6 0 BV (SUTURE) ×1 IMPLANT
SUT VIC AB 2-0 CT1 TAPERPNT 27 (SUTURE) ×2 IMPLANT
SUT VIC AB 3-0 SH 27X BRD (SUTURE) ×2 IMPLANT
SUT VIC AB 4-0 PS2 18 (SUTURE) IMPLANT
TOWEL GREEN STERILE (TOWEL DISPOSABLE) ×1 IMPLANT
TRAY FOLEY MTR SLVR 16FR STAT (SET/KITS/TRAYS/PACK) ×1 IMPLANT
WATER STERILE IRR 1000ML POUR (IV SOLUTION) ×1 IMPLANT

## 2024-02-19 NOTE — Anesthesia Preprocedure Evaluation (Addendum)
 Anesthesia Evaluation  Patient identified by MRN, date of birth, ID band Patient awake    Reviewed: Allergy & Precautions, H&P , NPO status , Patient's Chart, lab work & pertinent test results  Airway Mallampati: III  TM Distance: >3 FB Neck ROM: Full    Dental no notable dental hx. (+) Teeth Intact, Dental Advisory Given   Pulmonary neg pulmonary ROS   Pulmonary exam normal breath sounds clear to auscultation       Cardiovascular negative cardio ROS  Rhythm:Regular Rate:Normal     Neuro/Psych  Headaches  negative psych ROS   GI/Hepatic Neg liver ROS,GERD  ,,  Endo/Other  negative endocrine ROS    Renal/GU negative Renal ROS  negative genitourinary   Musculoskeletal   Abdominal   Peds  Hematology negative hematology ROS (+)   Anesthesia Other Findings   Reproductive/Obstetrics negative OB ROS                              Anesthesia Physical Anesthesia Plan  ASA: 2 and emergent  Anesthesia Plan: General   Post-op Pain Management: Ofirmev  IV (intra-op)*   Induction: Intravenous  PONV Risk Score and Plan: 3 and Ondansetron , Dexamethasone  and Midazolam   Airway Management Planned: Oral ETT  Additional Equipment:   Intra-op Plan:   Post-operative Plan: Extubation in OR  Informed Consent: I have reviewed the patients History and Physical, chart, labs and discussed the procedure including the risks, benefits and alternatives for the proposed anesthesia with the patient or authorized representative who has indicated his/her understanding and acceptance.     Dental advisory given  Plan Discussed with: CRNA  Anesthesia Plan Comments:          Anesthesia Quick Evaluation

## 2024-02-19 NOTE — Consult Note (Signed)
 Consult Note  NAOKI MIGLIACCIO [birth year]  [id number].    Requesting MD: Mliss Boyers, MD Chief Complaint/Reason for Consult: [facility] HPI:  Patient is a 68 year old male with PMH significant for atrial fibrillation s/p ablation in Jan 2025, HTN and known LIH. Normally patient is able to reduce LIH just by lying down but reports over the last 2 weeks increased difficulty with reducing and increased constipation. He has been having bowel function but is taking laxatives. He denies nausea or vomiting. He in the last 24 hrs feels like left groin has been significantly swollen with increased tenderness and firmness and skin appears discolored today which concerned him. He reports he did have an ablation in January and had a significant bleed/hematoma after that but had overall improved since then. He had noted a small knot still in that area but was told this was likely just from the sutures. Denies numbness or tingling of LLE. UOP has been fine and no penile discharge or bleeding. His wife is at bedside as well. He works as a Armed forces logistics/support/administrative officer.   ROS: Negative other than HPI  Family History  Problem Relation Age of Onset   Lymphoma Mother    Atrial fibrillation Mother        pacemaker in 75s   Leukemia Father        passed from CLL   Cancer Sister        treated, cancer free   Diabetes type II Sister    Heart disease Maternal Uncle        mother lost 6 brothers to heart disease in 65s    Past Medical History:  Diagnosis Date   Actinic keratosis 10/24/2009   ALLERGIC RHINITIS 05/11/2007   ELEVATED BLOOD PRESSURE WITHOUT DIAGNOSIS OF HYPERTENSION 11/12/2007   Dr. Mona mentioned HTN on problem list   Esophageal stricture    treated with protonix    GERD 05/11/2007   GI bleeding    20 years ago   Kidney stone    Melanoma (HCC) 01/2014   remoted from forehead   MIGRAINE HEADACHE 05/11/2007   Overweight    Persistent atrial fibrillation (HCC) 05/2014   Snores     Past  Surgical History:  Procedure Laterality Date   ANKLE FRACTURE SURGERY  2010   x2-left   ATRIAL FIBRILLATION ABLATION N/A 06/15/2021   Procedure: ATRIAL FIBRILLATION ABLATION;  Surgeon: Kelsie Agent, MD;  Location: [facility] INVASIVE CV LAB;  Service: Cardiovascular;  Laterality: N/A;   ATRIAL FIBRILLATION ABLATION N/A 08/21/2023   Procedure: ATRIAL FIBRILLATION ABLATION;  Surgeon: Inocencio Soyla Lunger, MD;  Location: [facility] INVASIVE CV LAB;  Service: Cardiovascular;  Laterality: N/A;   CARDIOVERSION N/A 06/23/2014   CARDIOVERSION N/A 09/09/2014   CARDIOVERSION N/A 04/13/2021   Procedure: CARDIOVERSION;  Surgeon: Barbaraann Darryle Ned, MD;  Location: [facility] ENDOSCOPY;  Service: Cardiovascular;  Laterality: N/A;   CARDIOVERSION N/A 08/01/2021   Procedure: CARDIOVERSION;  Surgeon: Barbaraann Darryle Ned, MD;  Location: [facility] ENDOSCOPY;  Service: Cardiovascular;  Laterality: N/A;   malignant melanoma     removed from forehead in June 2015   STRABISMUS SURGERY Right 05/21/2014   Dr. neysa   UPPER GI ENDOSCOPY     stricture, just PPI    Social History:  reports that he has never smoked. He has been exposed to tobacco smoke. He has never used smokeless tobacco. He reports that he does not drink alcohol and does not use drugs.  Allergies: No Known Allergies  (  Not in a hospital admission)   Blood pressure (!) 143/98, pulse 89, temperature 98.4 F (36.9 C), temperature source Oral, resp. rate 20, height 6' 1.5 (1.867 m), weight 101.6 kg, SpO2 100%. Physical Exam:  General: pleasant, WD, WN male who is laying in bed in NAD HEENT: head is normocephalic, atraumatic.  Sclera are noninjected.  PERRL.  Ears and nose without any masses or lesions.  Mouth is pink and moist Heart: regular, rate, and rhythm.  Palpable radial and pedal pulses bilaterally Lungs: No wheezes, rhonchi, or rales noted.  Respiratory effort nonlabored Abd: soft, NT, ND GU: left groin firm and edematous, not really erythematous or warm to the  touch like cellulitis but more like ecchymosis, unable to palpate LIH defect MS: all 4 extremities are symmetrical with no cyanosis, clubbing, or edema. Skin: warm and dry with no masses, lesions, or rashes Neuro: Cranial nerves 2-12 grossly intact, sensation is normal throughout Psych: A&Ox3 with an appropriate affect.   Results for orders placed or performed during the hospital encounter of 02/19/24 (from the past 48 hours)  Urinalysis, Routine w reflex microscopic -Urine, Clean Catch     Status: None   Collection Time: 02/19/24 11:11 AM  Result Value Ref Range   Color, Urine YELLOW YELLOW   APPearance CLEAR CLEAR   Specific Gravity, Urine 1.020 1.005 - 1.030   pH 5.0 5.0 - 8.0   Glucose, UA NEGATIVE NEGATIVE mg/dL   Hgb urine dipstick NEGATIVE NEGATIVE   Bilirubin Urine NEGATIVE NEGATIVE   Ketones, ur NEGATIVE NEGATIVE mg/dL   Protein, ur NEGATIVE NEGATIVE mg/dL   Nitrite NEGATIVE NEGATIVE   Leukocytes,Ua NEGATIVE NEGATIVE    Comment: Performed at [facility], [address] [place], [place] [address]  CBC with Differential     Status: None   Collection Time: 02/19/24 12:44 PM  Result Value Ref Range   WBC 8.1 4.0 - 10.5 K/uL   RBC 4.63 4.22 - 5.81 MIL/uL   Hemoglobin 14.8 13.0 - 17.0 g/dL   HCT 56.0 60.9 - 47.9 %   MCV 94.8 80.0 - 100.0 fL   MCH 32.0 26.0 - 34.0 pg   MCHC 33.7 30.0 - 36.0 g/dL   RDW 86.8 88.4 - 84.4 %   Platelets 163 150 - 400 K/uL   nRBC 0.0 0.0 - 0.2 %   Neutrophils Relative % 74 %   Neutro Abs 6.1 1.7 - 7.7 K/uL   Lymphocytes Relative 16 %   Lymphs Abs 1.3 0.7 - 4.0 K/uL   Monocytes Relative 7 %   Monocytes Absolute 0.6 0.1 - 1.0 K/uL   Eosinophils Relative 2 %   Eosinophils Absolute 0.2 0.0 - 0.5 K/uL   Basophils Relative 0 %   Basophils Absolute 0.0 0.0 - 0.1 K/uL   Immature Granulocytes 1 %   Abs Immature Granulocytes 0.04 0.00 - 0.07 K/uL    Comment: Performed at [facility], [address] [place], [place] [address]   Comprehensive metabolic panel     Status: Abnormal   Collection Time: 02/19/24 12:44 PM  Result Value Ref Range   Sodium 139 135 - 145 mmol/L   Potassium 4.1 3.5 - 5.1 mmol/L   Chloride 107 98 - 111 mmol/L   CO2 22 22 - 32 mmol/L   Glucose, Bld 93 70 - 99 mg/dL    Comment: Glucose reference range applies only to samples taken after fasting for at least 8 hours.   BUN 12 8 -  23 mg/dL   Creatinine, Ser 8.96 0.61 - 1.24 mg/dL   Calcium  9.0 8.9 - 10.3 mg/dL   Total Protein 6.6 6.5 - 8.1 g/dL   Albumin  4.0 3.5 - 5.0 g/dL   AST 12 (L) 15 - 41 U/L   ALT 11 0 - 44 U/L   Alkaline Phosphatase 75 38 - 126 U/L   Total Bilirubin 1.3 (H) 0.0 - 1.2 mg/dL   GFR, Estimated >39 >39 mL/min    Comment: (NOTE) Calculated using the CKD-EPI Creatinine Equation (2021)    Anion gap 10 5 - 15    Comment: Performed at [facility], [address] [place], [place] [address]  Lipase, blood     Status: None   Collection Time: 02/19/24 12:44 PM  Result Value Ref Range   Lipase 40 11 - 51 U/L    Comment: Performed at [facility], [address], [place], [place] [address]   CT ABDOMEN PELVIS W CONTRAST Result Date: 02/19/2024 CLINICAL DATA:  Hernia suspected, inguinal or femoral EXAM: CT ABDOMEN AND PELVIS WITH CONTRAST TECHNIQUE: Multidetector CT imaging of the abdomen and pelvis was performed using the standard protocol following bolus administration of intravenous contrast. RADIATION DOSE REDUCTION: This exam was performed according to the departmental dose-optimization program which includes automated exposure control, adjustment of the mA and/or kV according to patient size and/or use of iterative reconstruction technique. CONTRAST:  75mL OMNIPAQUE  IOHEXOL  350 MG/ML SOLN COMPARISON:  CT Renal Stone protocol April 16, 2016 FINDINGS: Lower chest: Bibasilar atelectasis scarring and fibrotic changes with mild bronchiectasis and bronchiolectasis. Hepatobiliary: No focal liver abnormality is seen.  No gallstones, gallbladder wall thickening, or biliary dilatation. Pancreas: Unremarkable. No pancreatic ductal dilatation or surrounding inflammatory changes. A peripancreatic subcentimeter lymph node measuring 7 mm adjacent to pancreatic head not enlarged by CT size criteria. Spleen: Normal in size without focal abnormality. Adrenals/Urinary Tract: Adrenal glands are unremarkable. Right kidney upper nonobstructing nephrolithiasis measuring 7 mm. Left kidney interpolar nonobstructive nephrolithiasis measuring 6 mm. Left-sided parapelvic cyst. The right a subcentimeter cortical cyst the which does not require imaging follow-up. No hydronephrosis. Bladder is unremarkable. Stomach/Bowel: Stomach is within normal limits. Appendix appears normal. No evidence of bowel wall thickening, distention, or inflammatory changes. Vascular/Lymphatic: No significant vascular findings are present. No enlarged abdominal or pelvic lymph nodes. Reproductive: Prostatomegaly protruding into bladder base measuring 5.9 x 6 x 6.5 cm with heterogeneous enhancement. Both testes are partially visualized and unremarkable. Other: Bilateral small fat containing inguinal hernias left greater than right. Distal descending colon is pulling toward the to the internal orifice of the left inguinal canal however not included in the hernia sac. There is a large hyperdense mass in left inguinal region lateral to the left inguinal canal measuring 9.8 x 7.4 x 8.7 cm. There is a more hyperdense component with Hounsfield unit of 135 most consistent contrast extravasation due to an active site of bleeding measuring 4.7 cm within the lesion. Peripheral fat stranding is identified surrounding this lesion. No definite connection is identified between the mass and vascular structures. The mass is abutting the anterior aspect of the left pectineus muscle and located anterior and medial to the femoral artery and vein. Compression affect is present upon the left  femoral vein in this region. Musculoskeletal: Degenerative changes of the spine. IMPRESSION: Hemorrhagic mass within the left inguinal region may represent vascular malformation or pseudoaneurysm, soft tissue sarcoma, hemorrhagic nodal metastasis or postprocedure/posttraumatic hematoma. The mass is external and lateral to  the left inguinal fat containing hernia. Active bleeding is present within this lesion. Correlate with clinical findings and patient's history. Bilateral fat containing inguinal hernias, left greater than right, stable to mildly enlarged to prior. Bilateral nonobstructive nephrolithiasis without hydronephrosis. Six Electronically Signed   By: Megan  Zare M.D.   On: 02/19/2024 15:21      Assessment/Plan Fat containing left inguinal hernia - not source of current left groin pain, can consider elective repair in the future once pseudoaneurysm addressed and resolved L groin pseudoaneurysm with active bleeding - recommend vascular surgery consult  - hgb 14.8, ordered STAT type and screen  General surgery will not follow acutely but are available as needed if concerns arise.   FEN: NPO VTE: last dose eliquis  yesterday PM ID: no current abx   I reviewed ED provider notes, last 24 h vitals and pain scores, last 48 h intake and output, last 24 h labs and trends, last 24 h imaging results, and Discussed with ED provider and vascular surgery.  This care required high  level of medical decision making.   Burnard JONELLE Louder, [facility] Surgery 02/19/2024, 3:31 PM Please see Amion for pager number during day hours 7:00am-4:30pm

## 2024-02-19 NOTE — Consult Note (Signed)
 Vascular [facility] Vein Specialist of   Patient name: Jorge Kelly MRN: [id number] DOB: [birth year] Sex: male   REQUESTING PROVIDER:    ER   REASON FOR CONSULT:    Left femoral pseudoaneurysm  HISTORY OF PRESENT ILLNESS:   Jorge Kelly is a 68 y.o. male, who presented to the emergency department with left groin pain.  He states that this has been bothering him for approximately 2 weeks.  He has had issues with constipation.  He has a known hernia.  The pain got so bad last night, this prompted coming to the emergency department.  He also noticed some bruising and bluish discoloration over the skin.  He ended up getting a CT scan that shows a large left femoral pseudoaneurysm.  The patient underwent cardiac ablation back in January which was complicated by some bleeding.  He is currently on Eliquis  for atrial fibrillation.  His last dose was last night.  He has not eaten since dinner last night.  He is on a GLP 1 medication for weight loss.  He is a non-smoker.  He is on Repatha  for hypercholesterolemia as well as Crestor   PAST MEDICAL HISTORY    Past Medical History:  Diagnosis Date   Actinic keratosis 10/24/2009   ALLERGIC RHINITIS 05/11/2007   ELEVATED BLOOD PRESSURE WITHOUT DIAGNOSIS OF HYPERTENSION 11/12/2007   Dr. Mona mentioned HTN on problem list   Esophageal stricture    treated with protonix    GERD 05/11/2007   GI bleeding    20 years ago   Kidney stone    Melanoma (HCC) 01/2014   remoted from forehead   MIGRAINE HEADACHE 05/11/2007   Overweight    Persistent atrial fibrillation (HCC) 05/2014   Snores      FAMILY HISTORY   Family History  Problem Relation Age of Onset   Lymphoma Mother    Atrial fibrillation Mother        pacemaker in 78s   Leukemia Father        passed from CLL   Cancer Sister        treated, cancer free   Diabetes type II Sister    Heart disease Maternal Uncle        mother lost 6 brothers to  heart disease in 32s    SOCIAL HISTORY:   Social History   Socioeconomic History   Marital status: Married    Spouse name: Not on file   Number of children: Not on file   Years of education: Not on file   Highest education level: Not on file  Occupational History   Occupation: Attorney  Tobacco Use   Smoking status: Never    Passive exposure: Past   Smokeless tobacco: Never   Tobacco comments:    Never smoke 08/15/22  Vaping Use   Vaping status: Never Used  Substance and Sexual Activity   Alcohol use: No    Alcohol/week: 0.0 standard drinks of alcohol   Drug use: No   Sexual activity: Not on file  Other Topics Concern   Not on file  Social History Narrative   Lives in [place] with wife (wife patient at [employer]).  2 sons.- 20 Logan- [employer]-and 25 Luke at [employer] in [place]- later [employer]     Wife with pregnancy at 47 and 39.       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      Hobbies: sails usually in [place] [employer]    Social  Drivers of Corporate investment banker Strain: Not on file  Food Insecurity: Not on file  Transportation Needs: Not on file  Physical Activity: Not on file  Stress: Not on file  Social Connections: Not on file  Intimate Partner Violence: Not on file    ALLERGIES:    No Known Allergies  CURRENT MEDICATIONS:    Current Facility-Administered Medications  Medication Dose Route Frequency Provider Last Rate Last Admin   morphine  (PF) 4 MG/ML injection 4 mg  4 mg Intravenous Once Haviland, Julie, MD       sodium chloride  0.9 % bolus 1,000 mL  1,000 mL Intravenous Once Haviland, Julie, MD       Current Outpatient Medications  Medication Sig Dispense Refill   acetaminophen  (TYLENOL ) 500 MG tablet Take 1,000 mg by mouth every 6 (six) hours as needed for moderate pain or headache.     apixaban  (ELIQUIS ) 5 MG TABS tablet Take 1 tablet (5 mg total) by mouth 2 (two) times daily. 180 tablet 3   diltiazem   (CARDIZEM  CD) 120 MG 24 hr capsule Take 1 capsule (120 mg total) by mouth daily. 90 capsule 3   Evolocumab  (REPATHA  SURECLICK) 140 MG/ML SOAJ Inject 140 mg into the skin every 14 (fourteen) days. 2 mL 11   fexofenadine (ALLEGRA) 180 MG tablet Take 180 mg by mouth daily as needed for allergies or rhinitis.     rizatriptan  (MAXALT -MLT) 10 MG disintegrating tablet TAKE DAILY AS NEEDED FOR MIGRAINE MAY REPEAT IN 2 HOURS IF NEEDED X1 9 tablet 2   rosuvastatin  (CRESTOR ) 20 MG tablet Take 1 tablet (20 mg total) by mouth daily. 90 tablet 0   tirzepatide  (ZEPBOUND ) 2.5 MG/0.5ML Pen Inject 2.5 mg into the skin once a week. 2 mL 5    REVIEW OF SYSTEMS:   [X]  denotes positive finding, [ ]  denotes negative finding Cardiac  Comments:  Chest pain or chest pressure:    Shortness of breath upon exertion:    Short of breath when lying flat:    Irregular heart rhythm:        Vascular    Pain in calf, thigh, or hip brought on by ambulation:    Pain in feet at night that wakes you up from your sleep:     Blood clot in your veins:    Leg swelling:         Pulmonary    Oxygen at home:    Productive cough:     Wheezing:         Neurologic    Sudden weakness in arms or legs:     Sudden numbness in arms or legs:     Sudden onset of difficulty speaking or slurred speech:    Temporary loss of vision in one eye:     Problems with dizziness:         Gastrointestinal    Blood in stool:      Vomited blood:         Genitourinary    Burning when urinating:     Blood in urine:        Psychiatric    Major depression:         Hematologic    Bleeding problems:    Problems with blood clotting too easily:        Skin    Rashes or ulcers:        Constitutional    Fever or chills:     PHYSICAL EXAM:  Vitals:   02/19/24 1000 02/19/24 1439 02/19/24 1500  BP: (!) 131/95 121/79 (!) 143/98  Pulse: 94 82 89  Resp: 20 20 20   Temp: 98.2 F (36.8 C)  98.4 F (36.9 C)  TempSrc: Oral  Oral  SpO2: 98%  95% 100%  Weight: 101.6 kg    Height: 6' 1.5 (1.867 m)      GENERAL: The patient is a well-nourished male, in no acute distress. The vital signs are documented above. CARDIAC: There is a regular rate and rhythm.  VASCULAR: Large left groin mass.  Palpable posterior tibial pulse PULMONARY: Nonlabored respirations ABDOMEN: Soft and non-tender with normal pitched bowel sounds.  MUSCULOSKELETAL: There are no major deformities or cyanosis. NEUROLOGIC: No focal weakness or paresthesias are detected. SKIN: There are no ulcers or rashes noted. PSYCHIATRIC: The patient has a normal affect.  STUDIES:   I have reviewed the following CTA: Hemorrhagic mass within the left inguinal region may represent vascular malformation or pseudoaneurysm, soft tissue sarcoma, hemorrhagic nodal metastasis or postprocedure/posttraumatic hematoma. The mass is external and lateral to the left inguinal fat containing hernia. Active bleeding is present within this lesion. Correlate with clinical findings and patient's history.   Bilateral fat containing inguinal hernias, left greater than right, stable to mildly enlarged to prior.    ASSESSMENT and PLAN   Left femoral pseudoaneurysm: I discussed going to the operating room for surgical repair.  I do not think this can be treated with a thrombin injection or with stenting.  We discussed the details of the repair, as well as the possibility of wound complications.  His last dose of Eliquis  was last night.  I have spoken with pharmacy give him Kcentra  as he says he has issues with bleeding while on Eliquis .     Malvina Serene CLORE, MD, FACS [facility] [phone] Pager [phone]

## 2024-02-19 NOTE — Anesthesia Procedure Notes (Signed)
 Arterial Line Insertion Start/End7/16/2025 6:17 AM, 02/19/2024 6:22 PM Performed by: Leonce Athens, MD, anesthesiologist  Patient location: OR. Preanesthetic checklist: patient identified, IV checked, site marked, monitors and equipment checked, pre-op evaluation and timeout performed Patient sedated Right, radial was placed Catheter size: 20 G Hand hygiene performed , maximum sterile barriers used  and Seldinger technique used  Attempts: 1 Procedure performed without using ultrasound guided technique. Following insertion, dressing applied and Biopatch. Post procedure assessment: normal  Patient tolerated the procedure well with no immediate complications.

## 2024-02-19 NOTE — Telephone Encounter (Signed)
 Hernia is bulging and is extremely tender; and constipated Sent to triage

## 2024-02-19 NOTE — Anesthesia Procedure Notes (Signed)
 Procedure Name: Intubation Date/Time: 02/19/2024 6:11 PM  Performed by: Genny Gun, CRNAPre-anesthesia Checklist: Patient identified, Emergency Drugs available, Suction available, Patient being monitored and Timeout performed Patient Re-evaluated:Patient Re-evaluated prior to induction Oxygen Delivery Method: Circle system utilized Preoxygenation: Pre-oxygenation with 100% oxygen Induction Type: IV induction Ventilation: Mask ventilation without difficulty and Oral airway inserted - appropriate to patient size Laryngoscope Size: Glidescope and 4 Grade View: Grade I Tube type: Oral Tube size: 7.5 mm Number of attempts: 1 Placement Confirmation: ETT inserted through vocal cords under direct vision, positive ETCO2 and breath sounds checked- equal and bilateral Secured at: 23 cm Tube secured with: Tape Dental Injury: Teeth and Oropharynx as per pre-operative assessment

## 2024-02-19 NOTE — Op Note (Signed)
 Patient name: Jorge Kelly MRN: [id number] DOB: 30-May-1956 Sex: male  02/19/2024 Pre-operative Diagnosis: Left femoral pseudoaneurysm Post-operative diagnosis:  Same Surgeon:  Malvina New Assistants:  S. Rhyne, PA Procedure:   #1: Repair of left femoral pseudoaneurysm   #2: Open exposure of left  femoral artery   #3: Prevena wound VAC Anesthesia:  General Blood Loss:  minimal Specimens:  none  Findings: The large 10 cm pseudoaneurysm arose off of a branch from the common femoral artery at the femoral bifurcation.  There was a very thick wall and large cavity.  Hematoma was evacuated and the aneurysm sac resected, closing some of the residual aneurysm sac over the artery.  A 15 Blake drain was placed.  I had general surgery come in to evaluate the hernia sac which was exposed during the dissection.  Indications: This is a 68 year old gentleman who underwent A-fib ablation in January.  He had been doing well until about 2 weeks ago when he started having a increasing bulge in his left groin which he thought was his known hernia.  The pain became too bad this morning and so he came to the hospital.  CT scan showed a large pseudoaneurysm.  We discussed going to the operating room tonight for repair.  He is on Eliquis .  His last dose was last night.  He was treated with Kcentra   Procedure:  The patient was identified in the holding area and taken to [facility] OR ROOM 16  The patient was then placed supine on the table. general anesthesia was administered.  The patient was prepped and draped in the usual sterile fashion.  A time out was called and antibiotics were administered.  A PA was necessary due to the complexity of the procedure.  She helped with exposure by providing suction and retraction.  She helped with hemostasis and wound closure.  Ultrasound was used to identify the common femoral artery above the pseudoaneurysm.  It had been displaced because of the large pseudoaneurysm.  I elected to  make a longitudinal incision over top of the pseudoaneurysm.  Cautery was used to divide subcutaneous tissue until I encountered the pseudoaneurysm.  I dissected this out medial and laterally.  There was significant scar tissue surrounding it.  I then was able to expose proximal to the pseudoaneurysm and dissect out the common femoral artery at the level of the inguinal ligament.  It was encircled with a vessel loop.  I then continued to dissect out the pseudoaneurysm.  Along the medial side, I identified his hernia.  The contents were protected.  I did not want to make the distal part of the incision a larger and so I elected to go ahead and open the pseudoaneurysm.  I did heparinized the patient with 10,000 units of heparin .  Once this had circulated, I tightened the vessel loop on the common femoral artery and then used cautery to open the pseudoaneurysm.  This was filled with a large amount of thrombus.  The capsule was very thick, at least 1 cm.  Ultimately I was able to evacuate all of the thrombus and identify the source of bleeding.  This was controlled with 5-0 Prolene sutures from inside the pseudoaneurysm sac.  I then continued to dissect out the common femoral artery distally.  At the level of the bifurcation there was a medial branch that was the inflow to the pseudoaneurysm.  This branch was ligated between 2-0 silk ties.  I then excised over half of the  pseudoaneurysm capsule.  There was no further bleeding.  I checked pedal Doppler signals and they were triphasic.  Therefore I reversed the heparin  with 50 mg of protamine .  I asked Dr. Ann with general surgery to come evaluate the hernia.  She inspected it and felt that this was just fat within the sac and that no repair was justified at this time.  Because there was some diffuse oozing from the raw surface areas, I elected to place a 15 Blake drain.  This was sutured to the skin with 3-0 nylon.  I then placed Surgiflo over top of the artery and in  the capsule.  I closed the remaining part of the pseudoaneurysm capsule over top of the femoral artery with a running 2-0 Vicryl.  The subcutaneous tissue was then closed with an additional layer of 2-0 Vicryl making sure not to enter the hernia sac or the drain.  The skin was closed with staples.  A Prevena wound VAC was placed.  There were no immediate complications.  He was successfully extubated and taken the recovery room in stable condition.   Disposition: To PACU stable.   ALONSO Malvina New, M.D., [facility] of [place] Office: [phone] Pager:  [phone]

## 2024-02-19 NOTE — ED Provider Notes (Signed)
 [place] EMERGENCY DEPARTMENT AT [facility] Provider Note   CSN: [id number] Arrival date & time: 02/19/24  9078     Patient presents with: Groin Pain and Hernia   Jorge Kelly is a 68 y.o. male.   Pt is a 68 yo male with pmhx significant for gerd, afib (on eliquis ), melanoma, and kidney stones.  Pt has had a known hernia, but it became very painful a few days ago, but worse since last night.  He said it normally reduces when he lies down, but is no longer reducing.  He has been having bms with otc meds.  Last bm was last night.  Last eliquis  taken was last night.       Prior to Admission medications   Medication Sig Start Date End Date Taking? Authorizing Provider  acetaminophen  (TYLENOL ) 500 MG tablet Take 1,000 mg by mouth every 6 (six) hours as needed for moderate pain or headache.    [provider]  apixaban  (ELIQUIS ) 5 MG TABS tablet Take 1 tablet (5 mg total) by mouth 2 (two) times daily. 04/05/23   Fenton, Clint R, PA  diltiazem  (CARDIZEM  CD) 120 MG 24 hr capsule Take 1 capsule (120 mg total) by mouth daily. 12/19/23   Aniceto Daphne CROME, NP  Evolocumab  (REPATHA  SURECLICK) 140 MG/ML SOAJ Inject 140 mg into the skin every 14 (fourteen) days. 01/31/24   Pavero, Lonni, RPH  fexofenadine (ALLEGRA) 180 MG tablet Take 180 mg by mouth daily as needed for allergies or rhinitis.    [provider]  rizatriptan  (MAXALT -MLT) 10 MG disintegrating tablet TAKE DAILY AS NEEDED FOR MIGRAINE MAY REPEAT IN 2 HOURS IF NEEDED X1 12/19/23   Katrinka Garnette KIDD, MD  rosuvastatin  (CRESTOR ) 20 MG tablet Take 1 tablet (20 mg total) by mouth daily. 01/14/24   Jeffrie Oneil BROCKS, MD  tirzepatide  (ZEPBOUND ) 2.5 MG/0.5ML Pen Inject 2.5 mg into the skin once a week. 12/02/23   Katrinka Garnette KIDD, MD    Allergies: Patient has no known allergies.    Review of Systems  Gastrointestinal:  Positive for abdominal pain.  All other systems reviewed and are negative.   Updated Vital  Signs BP (!) 143/98   Pulse 89   Temp 98.4 F (36.9 C) (Oral)   Resp 20   Ht 6' 1.5 (1.867 m)   Wt 101.6 kg   SpO2 100%   BMI 29.15 kg/m   Physical Exam Vitals and nursing note reviewed.  Constitutional:      Appearance: Normal appearance.  HENT:     Head: Normocephalic and atraumatic.     Right Ear: External ear normal.     Left Ear: External ear normal.     Nose: Nose normal.     Mouth/Throat:     Mouth: Mucous membranes are moist.     Pharynx: Oropharynx is clear.  Eyes:     Extraocular Movements: Extraocular movements intact.     Conjunctiva/sclera: Conjunctivae normal.     Pupils: Pupils are equal, round, and reactive to light.  Cardiovascular:     Rate and Rhythm: Normal rate and regular rhythm.     Pulses: Normal pulses.     Heart sounds: Normal heart sounds.  Pulmonary:     Effort: Pulmonary effort is normal.     Breath sounds: Normal breath sounds.  Abdominal:     General: Abdomen is flat. Bowel sounds are normal.     Palpations: Abdomen is soft.     Hernia: A  hernia is present. Hernia is present in the left inguinal area.     Comments: Hernia unable to be reduced  Musculoskeletal:        General: Normal range of motion.     Cervical back: Normal range of motion and neck supple.  Skin:    General: Skin is warm.     Capillary Refill: Capillary refill takes less than 2 seconds.  Neurological:     General: No focal deficit present.     Mental Status: He is alert and oriented to person, place, and time.  Psychiatric:        Mood and Affect: Mood normal.        Behavior: Behavior normal.     (all labs ordered are listed, but only abnormal results are displayed) Labs Reviewed  COMPREHENSIVE METABOLIC PANEL WITH GFR - Abnormal; Notable for the following components:      Result Value   AST 12 (*)    Total Bilirubin 1.3 (*)    All other components within normal limits  CBC WITH DIFFERENTIAL/PLATELET  LIPASE, BLOOD  URINALYSIS, ROUTINE W REFLEX  MICROSCOPIC  I-STAT CG4 LACTIC ACID, ED  I-STAT CG4 LACTIC ACID, ED  ABO/RH  TYPE AND SCREEN    EKG: None  Radiology: CT ABDOMEN PELVIS W CONTRAST Result Date: 02/19/2024 CLINICAL DATA:  Hernia suspected, inguinal or femoral EXAM: CT ABDOMEN AND PELVIS WITH CONTRAST TECHNIQUE: Multidetector CT imaging of the abdomen and pelvis was performed using the standard protocol following bolus administration of intravenous contrast. RADIATION DOSE REDUCTION: This exam was performed according to the departmental dose-optimization program which includes automated exposure control, adjustment of the mA and/or kV according to patient size and/or use of iterative reconstruction technique. CONTRAST:  75mL OMNIPAQUE  IOHEXOL  350 MG/ML SOLN COMPARISON:  CT Renal Stone protocol April 16, 2016 FINDINGS: Lower chest: Bibasilar atelectasis scarring and fibrotic changes with mild bronchiectasis and bronchiolectasis. Hepatobiliary: No focal liver abnormality is seen. No gallstones, gallbladder wall thickening, or biliary dilatation. Pancreas: Unremarkable. No pancreatic ductal dilatation or surrounding inflammatory changes. A peripancreatic subcentimeter lymph node measuring 7 mm adjacent to pancreatic head not enlarged by CT size criteria. Spleen: Normal in size without focal abnormality. Adrenals/Urinary Tract: Adrenal glands are unremarkable. Right kidney upper nonobstructing nephrolithiasis measuring 7 mm. Left kidney interpolar nonobstructive nephrolithiasis measuring 6 mm. Left-sided parapelvic cyst. The right a subcentimeter cortical cyst the which does not require imaging follow-up. No hydronephrosis. Bladder is unremarkable. Stomach/Bowel: Stomach is within normal limits. Appendix appears normal. No evidence of bowel wall thickening, distention, or inflammatory changes. Vascular/Lymphatic: No significant vascular findings are present. No enlarged abdominal or pelvic lymph nodes. Reproductive: Prostatomegaly  protruding into bladder base measuring 5.9 x 6 x 6.5 cm with heterogeneous enhancement. Both testes are partially visualized and unremarkable. Other: Bilateral small fat containing inguinal hernias left greater than right. Distal descending colon is pulling toward the to the internal orifice of the left inguinal canal however not included in the hernia sac. There is a large hyperdense mass in left inguinal region lateral to the left inguinal canal measuring 9.8 x 7.4 x 8.7 cm. There is a more hyperdense component with Hounsfield unit of 135 most consistent contrast extravasation due to an active site of bleeding measuring 4.7 cm within the lesion. Peripheral fat stranding is identified surrounding this lesion. No definite connection is identified between the mass and vascular structures. The mass is abutting the anterior aspect of the left pectineus muscle and located anterior and medial to the  femoral artery and vein. Compression affect is present upon the left femoral vein in this region. Musculoskeletal: Degenerative changes of the spine. IMPRESSION: Hemorrhagic mass within the left inguinal region may represent vascular malformation or pseudoaneurysm, soft tissue sarcoma, hemorrhagic nodal metastasis or postprocedure/posttraumatic hematoma. The mass is external and lateral to the left inguinal fat containing hernia. Active bleeding is present within this lesion. Correlate with clinical findings and patient's history. Bilateral fat containing inguinal hernias, left greater than right, stable to mildly enlarged to prior. Bilateral nonobstructive nephrolithiasis without hydronephrosis. Six Electronically Signed   By: Megan  Zare M.D.   On: 02/19/2024 15:21     Procedures   Medications Ordered in the ED  morphine  (PF) 4 MG/ML injection 4 mg (has no administration in time range)  morphine  (PF) 4 MG/ML injection 4 mg (4 mg Intravenous Given 02/19/24 1239)  ondansetron  (ZOFRAN ) injection 4 mg (4 mg Intravenous  Given 02/19/24 1238)  diazepam  (VALIUM ) injection 5 mg (5 mg Intravenous Given 02/19/24 1239)  sodium chloride  0.9 % bolus 1,000 mL (1,000 mLs Intravenous New Bag/Given 02/19/24 1548)  iohexol  (OMNIPAQUE ) 350 MG/ML injection 75 mL (75 mLs Intravenous Contrast Given 02/19/24 1427)                                    Medical Decision Making Amount and/or Complexity of Data Reviewed Labs: ordered. Radiology: ordered.  Risk Prescription drug management. Decision regarding hospitalization.   This patient presents to the ED for concern of groin pain, this involves an extensive number of treatment options, and is a complaint that carries with it a high risk of complications and morbidity.  The differential diagnosis includes incarcerated hernia, reducible hernia   Co morbidities that complicate the patient evaluation  gerd, afib (on eliquis ), melanoma, and kidney stones   Additional history obtained:  Additional history obtained from epic chart review External records from outside source obtained and reviewed including wife   Lab Tests:  I Ordered, and personally interpreted labs.  The pertinent results include:  cbc nl, ua nl, cmp nl   Imaging Studies ordered:  I ordered imaging studies including ct abd pelvis  I independently visualized and interpreted imaging which showed  Hemorrhagic mass within the left inguinal region may represent  vascular malformation or pseudoaneurysm, soft tissue sarcoma,  hemorrhagic nodal metastasis or postprocedure/posttraumatic  hematoma. The mass is external and lateral to the left inguinal fat  containing hernia. Active bleeding is present within this lesion.  Correlate with clinical findings and patient's history.    Bilateral fat containing inguinal hernias, left greater than right,  stable to mildly enlarged to prior.    Bilateral nonobstructive nephrolithiasis without hydronephrosis.    Six   I agree with the radiologist  interpretation   Cardiac Monitoring:  The patient was maintained on a cardiac monitor.  I personally viewed and interpreted the cardiac monitored which showed an underlying rhythm of: nsr   Medicines ordered and prescription drug management:  I ordered medication including ivfs/morphine , valium , zofran   for sx  Reevaluation of the patient after these medicines showed that the patient improved I have reviewed the patients home medicines and have made adjustments as needed   Test Considered:  ct   Consultations Obtained:  I requested consultation with the surgeons,  and discussed lab and imaging findings as well as pertinent plan - after the CT report came back, they recommended vascular Pt d/w  Dr. Serene (vascular) who will take pt to the OR; likely tomorrow.  No reversal of Eliquis .   Problem List / ED Course:  Pseudoaneurysm with active bleeding:  vascular to repair.   Reevaluation:  After the interventions noted above, I reevaluated the patient and found that they have :improved   Social Determinants of Health:  Lives at home   Dispostion:  After consideration of the diagnostic results and the patients response to treatment, I feel that the patent would benefit from admission.       Final diagnoses:  Pseudoaneurysm [facility])  On apixaban  therapy    ED Discharge Orders     None          Dean Clarity, MD 02/19/24 1615

## 2024-02-19 NOTE — Transfer of Care (Signed)
 Immediate Anesthesia Transfer of Care Note  Patient: Jorge Kelly  Procedure(s) Performed: REPAIR, PSEUDOANEURYSM (Left: Groin) APPLICATION PREVENA WOUND VAC LEFT GROIN (Left: Groin)  Patient Location: PACU  Anesthesia Type:General  Level of Consciousness: awake, alert , and oriented  Airway & Oxygen Therapy: Patient Spontanous Breathing  Post-op Assessment: Report given to RN and Post -op Vital signs reviewed and stable  Post vital signs: Reviewed and stable  Last Vitals:  Vitals Value Taken Time  BP 138/91 02/19/24 20:23  Temp    Pulse 90 02/19/24 20:23  Resp 17 02/19/24 20:23  SpO2 94 % 02/19/24 20:23  Vitals shown include unfiled device data.  Last Pain:  Vitals:   02/19/24 1500  TempSrc: Oral  PainSc:          Complications: No notable events documented.

## 2024-02-19 NOTE — Telephone Encounter (Addendum)
 Reason for Disposition  [1] Constant abdominal pain AND [2] present > 2 hours    Has a hernia in left groin that has been there for several years.   Now it is hard and painful since he is having problems with constipation.  He thinks it is due to the Zepbound  causing the constipation.  Referred to the ED.  Answer Assessment - Initial Assessment Questions 1. STOOL PATTERN OR FREQUENCY: How often do you have a bowel movement (BM)?  (Normal range: 3 times a day to every 3 days)  When was your last BM?      Staff with [facility] practice asked that I triage this pt and transferred his call to me.  I'm constipated.   I'm on Zepbound .   July 4th I became constipated.   It's been 2 weeks of constipation.  I'm taking OTC laxatives so I've had BMs.   Last BM was yesterday morning but I'm still constipated.  I have  a hernia in my left groin area.  It's bulging.  It's been bulging for years but not like this.   It's been soft and easily pressed in but it's getting worse since July 4th when I became constipated.   It has become hard and sore. I had a heart ablation in Jan.   They went into my leg below the hernia and it's been a little sore since then but soft.  It's never been hard and sore like it is now.    The hernia is hard now but it's been soft.   It's been hard for a week and it's hurting.  The past 4 days it has become more painful.     No nausea or vomiting.   At this point I referred him to the ED.  He is going to [facility].   I explained the risk of his intestine being trapped in the hernia and cutting off the blood supply.   I'm aware of the complication so I will go on to the ED.    I've had this for several years but never had it become hard and sore.    2. STRAINING: Do you have to strain to have a BM?      At times 3. ONSET: When did the constipation begin?     July 4th 4. RECTAL PAIN: Does your rectum hurt when the stool comes out? If Yes, ask: Do you have  hemorrhoids? How bad is the pain?  (Scale 1-10; or mild, moderate, severe)     Not asked 5. BM COMPOSITION: Are the stools hard?      I'm using OTC laxatives to have a BM but I'm still constipated. 6. BLOOD ON STOOLS: Has there been any blood on the toilet tissue or on the surface of the BM? If Yes, ask: When was the last time?     No 7. CHRONIC CONSTIPATION: Is this a new problem for you?  If No, ask: How long have you had this problem? (days, weeks, months)      No  I've had problems on and off with the Zepbound . 8. CHANGES IN DIET OR HYDRATION: Have there been any recent changes in your diet? How much fluids are you drinking on a daily basis?  How much have you had to drink today?     Not asked 9. MEDICINES: Have you been taking any new medicines? Are you taking any narcotic pain medicines? (e.g., Dilaudid , morphine , Percocet, Vicodin)  I'm on Zepbound  10. LAXATIVES: Have you been using any stool softeners, laxatives, or enemas?  If Yes, ask What are you using, how often, and when was the last time?       Yes using OTC laxatives and having BMs but still constipated and the hernia has become hard and sore and painful. 11. ACTIVITY:  How much walking do you do every day?  Has your activity level decreased in the past week?        Not asked 12. CAUSE: What do you think is causing the constipation?        Zepbound  13. MEDICAL HISTORY: Do you have a history of hemorrhoids, rectal fissures, rectal surgery, or rectal abscess?         Not asked 14. OTHER SYMPTOMS: Do you have any other symptoms? (e.g., abdomen pain, bloating, fever, vomiting)       No diarrhea, nausea or vomiting 15. PREGNANCY: Is there any chance you are pregnant? When was your last menstrual period?       N/A  Protocols used: Constipation-A-AH FYI Only or Action Required?: FYI only for provider.  Patient was last seen in primary care on 12/02/2023 by Katrinka Garnette KIDD, MD.  Called  Nurse Triage reporting Constipation (hernia).  Symptoms began several weeks ago. The last 4 days the hernia in his left groin has been hard and painful.  Having issues with constipation.  The hernia has become hard since becoming constipated on July 4th.      Interventions attempted: OTC medications: Using OTC laxatives and having BMs but still constipated.  Thinks it is due to the Zepbound  possibly.  Symptoms are: rapidly worsening The hernia has gotten hard and painful over the last 4 days.  .  Triage Disposition: Go to ED Now (Notify PCP)  Patient/caregiver understands and will follow disposition?: Yes Pt going to [facility] ED.   I cancelled his 11:20 appt for this morning with Dr. Katrinka after checking with staff at [facility] and giving her the update.

## 2024-02-19 NOTE — ED Triage Notes (Addendum)
 Pt. Stated, I have a hernia on left side that's gotten really hard and painful , started 12 days ago. I became constipated and it started growing in size.

## 2024-02-19 NOTE — Telephone Encounter (Signed)
 Sent to ED

## 2024-02-19 NOTE — Anesthesia Postprocedure Evaluation (Signed)
 Anesthesia Post Note  Patient: Jorge Kelly  Procedure(s) Performed: LEFT FEMORAL ARTERY PSEUDOANEURYSM REPAIR (Left: Groin) APPLICATION PREVENA WOUND VAC LEFT GROIN (Left: Groin)     Patient location during evaluation: PACU Anesthesia Type: General Level of consciousness: awake and alert, oriented and patient cooperative Pain management: pain level controlled Vital Signs Assessment: post-procedure vital signs reviewed and stable Respiratory status: spontaneous breathing, nonlabored ventilation, respiratory function stable and patient connected to nasal cannula oxygen Cardiovascular status: blood pressure returned to baseline and stable Postop Assessment: no apparent nausea or vomiting Anesthetic complications: no   No notable events documented.  Last Vitals:  Vitals:   02/19/24 2030 02/19/24 2045  BP: 129/88 131/83  Pulse: 89 88  Resp: 18 12  Temp:    SpO2: (!) 87% 94%    Last Pain:  Vitals:   02/19/24 2028  TempSrc:   PainSc: 5                  Mialynn Shelvin,E. Cayli Escajeda

## 2024-02-20 ENCOUNTER — Encounter (HOSPITAL_COMMUNITY): Payer: Self-pay | Admitting: Surgery

## 2024-02-20 LAB — CBC
HCT: 36.4 % — ABNORMAL LOW (ref 39.0–52.0)
Hemoglobin: 12.5 g/dL — ABNORMAL LOW (ref 13.0–17.0)
MCH: 31.8 pg (ref 26.0–34.0)
MCHC: 34.3 g/dL (ref 30.0–36.0)
MCV: 92.6 fL (ref 80.0–100.0)
Platelets: 133 K/uL — ABNORMAL LOW (ref 150–400)
RBC: 3.93 MIL/uL — ABNORMAL LOW (ref 4.22–5.81)
RDW: 12.8 % (ref 11.5–15.5)
WBC: 4.7 K/uL (ref 4.0–10.5)
nRBC: 0 % (ref 0.0–0.2)

## 2024-02-20 LAB — BASIC METABOLIC PANEL WITH GFR
Anion gap: 6 (ref 5–15)
BUN: 10 mg/dL (ref 8–23)
CO2: 24 mmol/L (ref 22–32)
Calcium: 8.7 mg/dL — ABNORMAL LOW (ref 8.9–10.3)
Chloride: 108 mmol/L (ref 98–111)
Creatinine, Ser: 0.78 mg/dL (ref 0.61–1.24)
GFR, Estimated: >60 mL/min (ref >60)
Glucose, Bld: 154 mg/dL — ABNORMAL HIGH (ref 70–99)
Potassium: 4.1 mmol/L (ref 3.5–5.1)
Sodium: 138 mmol/L (ref 135–145)

## 2024-02-20 LAB — POCT ACTIVATED CLOTTING TIME: Activated Clotting Time: 251 s

## 2024-02-20 MED ORDER — CEFAZOLIN SODIUM-DEXTROSE 2-4 GM/100ML-% IV SOLN
2.0000 g | Freq: Three times a day (TID) | INTRAVENOUS | Status: DC
  Administered 2024-02-20 – 2024-02-21 (×4): 2 g via INTRAVENOUS
  Filled 2024-02-20 (×3): qty 100

## 2024-02-20 NOTE — Progress Notes (Addendum)
  Progress Note    02/20/2024 8:07 AM 1 Day Post-Op  Subjective:  no complaints. Denies any pain    Vitals:   02/19/24 2300 02/20/24 0300  BP: 122/81 128/85  Pulse: 88 85  Resp: 20   Temp: 98.1 F (36.7 C) 97.9 F (36.6 C)  SpO2: 95% 96%   Physical Exam: Cardiac:  regular Lungs:  non labored Incisions:  left groin incision with Prevena VAC to suction. Good seal. Soft around. JP with 50 cc bloody output Extremities:  BLE well perfused and warm with palpable DP pulses Abdomen:  soft Neurologic: alert and oriented  CBC    Component Value Date/Time   WBC 4.7 02/20/2024 0330   RBC 3.93 (L) 02/20/2024 0330   HGB 12.5 (L) 02/20/2024 0330   HGB 15.3 08/24/2021 0902   HCT 36.4 (L) 02/20/2024 0330   HCT 42.8 08/24/2021 0902   PLT 133 (L) 02/20/2024 0330   PLT 171 08/24/2021 0902   MCV 92.6 02/20/2024 0330   MCV 88 08/24/2021 0902   MCH 31.8 02/20/2024 0330   MCHC 34.3 02/20/2024 0330   RDW 12.8 02/20/2024 0330   RDW 14.3 08/24/2021 0902   LYMPHSABS 1.3 02/19/2024 1244   LYMPHSABS 1.9 05/23/2021 0000   MONOABS 0.6 02/19/2024 1244   EOSABS 0.2 02/19/2024 1244   EOSABS 0.2 05/23/2021 0000   BASOSABS 0.0 02/19/2024 1244   BASOSABS 0.1 05/23/2021 0000    BMET    Component Value Date/Time   NA 138 02/20/2024 0430   NA 139 09/22/2021 0929   K 4.1 02/20/2024 0430   CL 108 02/20/2024 0430   CO2 24 02/20/2024 0430   GLUCOSE 154 (H) 02/20/2024 0430   BUN 10 02/20/2024 0430   BUN 21 09/22/2021 0929   CREATININE 0.78 02/20/2024 0430   CREATININE 0.83 04/18/2016 1443   CALCIUM  8.7 (L) 02/20/2024 0430   GFRNONAA >60 02/20/2024 0430   GFRAA >90 05/22/2014 0330    INR    Component Value Date/Time   INR 1.0 04/18/2016 1443     Intake/Output Summary (Last 24 hours) at 02/20/2024 9192 Last data filed at 02/20/2024 0700 Gross per 24 hour  Intake 1951.09 ml  Output 275 ml  Net 1676.09 ml     Assessment/Plan:  68 y.o. male is s/p  #1: Repair of left femoral  pseudoaneurysm #2: Open exposure of left  femoral artery #3: Prevena wound VAC 1 Day Post-Op   Left groin incision without pain Left groin incision is intact. Prevena in place with good seal. Soft around JP with 50 cc output. Will keep another day Mobilize today H&H stable Will restart Eliquis  later today Likely d/c home tomorrow  Teretha Damme, [facility] [phone] 02/20/2024 8:07 AM   I agree with the above.  I have seen and evaluated the patient.  He is status post open repair of left femoral pseudoaneurysm.  No significant pain this morning.  JP with 50 cc output.  Plan to restart Eliquis  this evening and monitor for any bleeding.  Anticipate discharge tomorrow.  He will need a work excuse for 3 weeks.  Malvina New

## 2024-02-20 NOTE — Evaluation (Signed)
 Occupational Therapy Evaluation Patient Details Name: Jorge Kelly MRN: [id number] DOB: 04-Jan-1956 Today's Date: 02/20/2024   History of Present Illness   68 y.o. male admitted 7/16  s/p repair of left femoral pseudoaneurysm,  open exposure of left  femoral artery, prevena wound VAC post-op. PMH:  gerd, afib (on eliquis ), melanoma, and kidney stones.     Clinical Impressions Pt admitted based on above, and was seen based on problem list below. PTA pt was independent with ADLs and IADLs. Today pt is requiring s for safety for ADLs. Bed mobility was mod I and functional transfers are  s for safety without an AD. Pt completing LB dressing and bathing with supervision, just more painful and requiring effort than normal. Anticipate pt will progress well, no follow up OT or DME needs.OT will continue to follow acutely to maximize functional independence.     If plan is discharge home, recommend the following:   Assistance with cooking/housework     Functional Status Assessment   Patient has had a recent decline in their functional status and demonstrates the ability to make significant improvements in function in a reasonable and predictable amount of time.     Equipment Recommendations   None recommended by OT      Precautions/Restrictions   Precautions Precautions: Fall Recall of Precautions/Restrictions: Intact Precaution/Restrictions Comments: Lt groin JP drain and prevena wound vac Restrictions Weight Bearing Restrictions Per Provider Order: No     Mobility Bed Mobility Overal bed mobility: Modified Independent     General bed mobility comments: Increased time    Transfers Overall transfer level: Needs assistance Equipment used: None Transfers: Sit to/from Stand, Bed to chair/wheelchair/BSC Sit to Stand: Supervision     Step pivot transfers: Supervision     General transfer comment: S for safety      Balance Overall balance assessment: Modified  Independent         ADL either performed or assessed with clinical judgement   ADL Overall ADL's : Needs assistance/impaired Eating/Feeding: Set up;Sitting   Grooming: Oral care;Supervision/safety;Standing   Upper Body Bathing: Supervision/ safety;Standing   Lower Body Bathing: Supervison/ safety;Sit to/from stand   Upper Body Dressing : Supervision/safety;Standing   Lower Body Dressing: Supervision/safety;Sit to/from stand   Toilet Transfer: Supervision/safety;Ambulation Toilet Transfer Details (indicate cue type and reason): Simulated in room         Functional mobility during ADLs: Supervision/safety General ADL Comments: Pain and difficulty reaching feet but able to complete     Vision Baseline Vision/History: 1 Wears glasses Patient Visual Report: No change from baseline Vision Assessment?: No apparent visual deficits            Pertinent Vitals/Pain Pain Assessment Pain Assessment: Faces Faces Pain Scale: Hurts little more Pain Location: Lt groin, operative site when standing only Pain Descriptors / Indicators: Sore, Tightness Pain Intervention(s): Monitored during session     Extremity/Trunk Assessment Upper Extremity Assessment Upper Extremity Assessment: Generalized weakness   Lower Extremity Assessment Lower Extremity Assessment: Defer to PT evaluation LLE Deficits / Details: guarded, but functionl, as expected post op   Cervical / Trunk Assessment Cervical / Trunk Assessment: Normal   Communication Communication Communication: No apparent difficulties   Cognition Arousal: Alert Behavior During Therapy: WFL for tasks assessed/performed Cognition: No apparent impairments     Following commands: Intact       Cueing  General Comments      VSS on RA           Home  Living Family/patient expects to be discharged to:: Private residence Living Arrangements: Spouse/significant other Available Help at Discharge: Family;Available 24  hours/day Type of Home: House Home Access: Stairs to enter [employer] of Steps: 3 Entrance Stairs-Rails: None Home Layout: Two level;Able to live on main level with bedroom/bathroom Alternate Level Stairs-Number of Steps: flight   Bathroom Shower/Tub: Producer, television/film/video: Standard     Home Equipment: None          Prior Functioning/Environment Prior Level of Function : Independent/Modified Independent;Driving;Working/employed             Mobility Comments: ind, active ADLs Comments: ind working, driving    OT Problem List: Decreased strength;Decreased range of motion;Decreased activity tolerance;Impaired balance (sitting and/or standing);Decreased safety awareness   OT Treatment/Interventions: Self-care/ADL training;Therapeutic exercise;Energy conservation;DME and/or AE instruction;Therapeutic activities;Patient/family education;Balance training      OT Goals(Current goals can be found in the care plan section)   Acute Rehab OT Goals Patient Stated Goal: To go home OT Goal Formulation: With patient Time For Goal Achievement: 03/05/24 Potential to Achieve Goals: Good   OT Frequency:  Min 2X/week       AM-PAC OT 6 Clicks Daily Activity     Outcome Measure Help from another person eating meals?: None Help from another person taking care of personal grooming?: A Little Help from another person toileting, which includes using toliet, bedpan, or urinal?: A Little Help from another person bathing (including washing, rinsing, drying)?: A Little Help from another person to put on and taking off regular upper body clothing?: A Little Help from another person to put on and taking off regular lower body clothing?: A Little 6 Click Score: 19   End of Session Nurse Communication: Mobility status  Activity Tolerance: Patient tolerated treatment well Patient left: in bed;with call bell/phone within reach  OT Visit Diagnosis: Unsteadiness on  feet (R26.81);Other abnormalities of gait and mobility (R26.89);Muscle weakness (generalized) (M62.81)                Time: 1025-1039 OT Time Calculation (min): 14 min Charges:  OT General Charges $OT Visit: 1 Visit OT Evaluation $OT Eval Moderate Complexity: 1 Mod  Adrianne BROCKS, OT  Acute Rehabilitation Services Office [phone] Secure chat preferred   Adrianne GORMAN Savers 02/20/2024, 12:35 PM

## 2024-02-20 NOTE — Evaluation (Signed)
 Physical Therapy Evaluation Patient Details Name: Jorge Kelly MRN: [id number] DOB: [birth year] Today's Date: 02/20/2024  History of Present Illness  69 y.o. male admitted 7/16  s/p repair of left femoral pseudoaneurysm,  open exposure of left  femoral artery, prevena wound VAC post-op. PMH:  gerd, afib (on eliquis ), melanoma, and kidney stones.  Clinical Impression  Patient is s/p above surgery resulting in functional limitations due to the deficits listed below (see PT Problem List). Independent and active PTA. Supervision for safety with transfers and gait, first time out of bed post-op. Some incisional pain but no drainage around wound bed noted with activity. Anticipate great recovery. Encouraged OOB often and mobilize as tolerated. Do not foresee any need for OPPT follow-up. Patient will benefit from acute skilled PT to increase their independence and safety with mobility to facilitate discharge.         If plan is discharge home, recommend the following: Assistance with cooking/housework;Assist for transportation;Help with stairs or ramp for entrance   Can travel by private vehicle        Equipment Recommendations None recommended by PT  Recommendations for Other Services       Functional Status Assessment Patient has had a recent decline in their functional status and demonstrates the ability to make significant improvements in function in a reasonable and predictable amount of time.     Precautions / Restrictions Precautions Precautions: Fall Recall of Precautions/Restrictions: Intact Precaution/Restrictions Comments: Lt groin JP drain and prevena wound vac Restrictions Weight Bearing Restrictions Per Provider Order: No      Mobility  Bed Mobility Overal bed mobility: Modified Independent             General bed mobility comments: no assist required, extra time    Transfers Overall transfer level: Needs assistance Equipment used: None Transfers: Sit to/from  Stand Sit to Stand: Supervision           General transfer comment: supervision for safety, mild sway initially but able to self correct, cues for safety and symptom awareness. Stablilize prior to attempting steps.    Ambulation/Gait Ambulation/Gait assistance: Supervision Gait Distance (Feet): 125 Feet Assistive device: IV Pole Gait Pattern/deviations: Step-through pattern, Decreased stride length, Antalgic Gait velocity: dec Gait velocity interpretation: <1.8 ft/sec, indicate of risk for recurrent falls   General Gait Details: Gently pushing IV pole for support with likely slower than baseline gait speed but no evidence of LOB or buckling. Slightly antalgic. Some increased pain around surgical site. Spo2 95% on Room air.  Stairs Stairs:  (Declines - based on functional strength anticipate pt able to safely navigate steps at home and has wife available to assist as needed.)          Wheelchair Mobility     Tilt Bed    Modified Rankin (Stroke Patients Only)       Balance Overall balance assessment: Modified Independent                                           Pertinent Vitals/Pain Pain Assessment Pain Assessment: Faces Faces Pain Scale: Hurts little more Pain Location: Lt groin, operative site when standing only Pain Descriptors / Indicators: Sore, Tightness Pain Intervention(s): Limited activity within patient's tolerance, Monitored during session, Repositioned    Home Living Family/patient expects to be discharged to:: Private residence Living Arrangements: Spouse/significant other Available Help at Discharge: Family;Available 24  hours/day Type of Home: House Home Access: Stairs to enter Entrance Stairs-Rails: None Entrance Stairs-Number of Steps: 3 Alternate Level Stairs-Number of Steps: flight Home Layout: Two level;Able to live on main level with bedroom/bathroom Home Equipment: None      Prior Function Prior Level of Function :  Independent/Modified Independent;Driving;Working/employed             Mobility Comments: ind, active ADLs Comments: ind working, driving     Extremity/Trunk Assessment   Upper Extremity Assessment Upper Extremity Assessment: Defer to OT evaluation    Lower Extremity Assessment Lower Extremity Assessment: LLE deficits/detail LLE Deficits / Details: guarded, but functionl, as expected post op       Communication   Communication Communication: No apparent difficulties    Cognition Arousal: Alert Behavior During Therapy: WFL for tasks assessed/performed   PT - Cognitive impairments: No apparent impairments                                 Cueing       General Comments General comments (skin integrity, edema, etc.): No drainage noted around wound site, remains clean and dry. prevena empty, Jp drain with some drainage but did not appear to increase with activity.    Exercises General Exercises - Lower Extremity Ankle Circles/Pumps: AROM, Both, 10 reps, Supine Quad Sets: Strengthening, Both, 10 reps, Supine Gluteal Sets: Strengthening, Both, 10 reps, Supine   Assessment/Plan    PT Assessment Patient needs continued PT services  PT Problem List Decreased strength;Decreased activity tolerance;Decreased balance;Decreased mobility;Pain       PT Treatment Interventions DME instruction;Gait training;Stair training;Functional mobility training;Therapeutic activities;Therapeutic exercise;Balance training;Neuromuscular re-education;Patient/family education;Modalities    PT Goals (Current goals can be found in the Care Plan section)  Acute Rehab PT Goals Patient Stated Goal: return to PLOF PT Goal Formulation: With patient Time For Goal Achievement: 03/05/24 Potential to Achieve Goals: Good    Frequency Min 2X/week     Co-evaluation               AM-PAC PT 6 Clicks Mobility  Outcome Measure Help needed turning from your back to your side while  in a flat bed without using bedrails?: None Help needed moving from lying on your back to sitting on the side of a flat bed without using bedrails?: None Help needed moving to and from a bed to a chair (including a wheelchair)?: A Little Help needed standing up from a chair using your arms (e.g., wheelchair or bedside chair)?: A Little Help needed to walk in hospital room?: A Little Help needed climbing 3-5 steps with a railing? : A Little 6 Click Score: 20    End of Session Equipment Utilized During Treatment: Gait belt Activity Tolerance: Patient tolerated treatment well Patient left: in bed;with call bell/phone within reach (OT entering room)   PT Visit Diagnosis: Other abnormalities of gait and mobility (R26.89);Unsteadiness on feet (R26.81);Pain Pain - Right/Left: Left Pain - part of body:  (groin)    Time: 8994-8972 PT Time Calculation (min) (ACUTE ONLY): 22 min   Charges:   PT Evaluation $PT Eval Low Complexity: 1 Low   PT General Charges $$ ACUTE PT VISIT: 1 Visit         Leontine Roads, PT, DPT [facility]  Rehabilitation Services Physical Therapist Office: [phone] Website: [email or web address]   Leontine GORMAN Roads 02/20/2024, 10:57 AM

## 2024-02-21 ENCOUNTER — Other Ambulatory Visit (HOSPITAL_COMMUNITY): Payer: Self-pay

## 2024-02-21 LAB — BASIC METABOLIC PANEL WITH GFR
Anion gap: 8 (ref 5–15)
BUN: 13 mg/dL (ref 8–23)
CO2: 23 mmol/L (ref 22–32)
Calcium: 8.8 mg/dL — ABNORMAL LOW (ref 8.9–10.3)
Chloride: 108 mmol/L (ref 98–111)
Creatinine, Ser: 0.95 mg/dL (ref 0.61–1.24)
GFR, Estimated: >60 mL/min (ref >60)
Glucose, Bld: 126 mg/dL — ABNORMAL HIGH (ref 70–99)
Potassium: 3.7 mmol/L (ref 3.5–5.1)
Sodium: 139 mmol/L (ref 135–145)

## 2024-02-21 MED ORDER — APIXABAN 5 MG PO TABS
5.0000 mg | ORAL_TABLET | Freq: Two times a day (BID) | ORAL | Status: DC
  Administered 2024-02-21: 5 mg via ORAL
  Filled 2024-02-21: qty 1

## 2024-02-21 NOTE — Progress Notes (Signed)
 Mobility Specialist Progress Note:    02/21/24 1040  Mobility  Activity Ambulated independently in hallway;Ambulated independently in room  Level of Assistance Independent  Assistive Device None  Distance Ambulated (ft) 300 ft  Activity Response Tolerated well  Mobility Referral Yes  Mobility visit 1 Mobility  Mobility Specialist Start Time (ACUTE ONLY) 1040  Mobility Specialist Stop Time (ACUTE ONLY) 1050  Mobility Specialist Time Calculation (min) (ACUTE ONLY) 10 min   Pt received in bed, agreeable to mobility session. Ambulated in hallway independently. Tolerated well, asx throughout. Eager for d/c.   Jorge Kelly Mobility Specialist Please contact via SecureChat or  Rehab office at [phone]

## 2024-02-21 NOTE — Discharge Instructions (Addendum)
 Information on my medicine - ELIQUIS  (apixaban )  Why was Eliquis  prescribed for you? Eliquis  was prescribed for you to reduce the risk of a blood clot forming that can cause a stroke if you have a medical condition called atrial fibrillation (a type of irregular heartbeat).  What do You need to know about Eliquis  ? Take your Eliquis  TWICE DAILY - one tablet in the morning and one tablet in the evening with or without food. If you have difficulty swallowing the tablet whole please discuss with your pharmacist how to take the medication safely.  Take Eliquis  exactly as prescribed by your doctor and DO NOT stop taking Eliquis  without talking to the doctor who prescribed the medication.  Stopping may increase your risk of developing a stroke.  Refill your prescription before you run out.  After discharge, you should have regular check-up appointments with your healthcare provider that is prescribing your Eliquis .  In the future your dose may need to be changed if your kidney function or weight changes by a significant amount or as you get older.  What do you do if you miss a dose? If you miss a dose, take it as soon as you remember on the same day and resume taking twice daily.  Do not take more than one dose of ELIQUIS  at the same time to make up a missed dose.  Important Safety Information A possible side effect of Eliquis  is bleeding. You should call your healthcare provider right away if you experience any of the following: Bleeding from an injury or your nose that does not stop. Unusual colored urine (red or dark brown) or unusual colored stools (red or black). Unusual bruising for unknown reasons. A serious fall or if you hit your head (even if there is no bleeding).  Some medicines may interact with Eliquis  and might increase your risk of bleeding or clotting while on Eliquis . To help avoid this, consult your healthcare provider or pharmacist prior to using any new prescription or  non-prescription medications, including herbals, vitamins, non-steroidal anti-inflammatory drugs (NSAIDs) and supplements.  This website has more information on Eliquis  (apixaban ): [email or web address] .[email or web address]     [facility]  Discharge instructions  Lower Extremity Bypass Surgery  Please refer to the following instruction for your post-procedure care. Your surgeon or physician assistant will discuss any changes with you.  Activity  You are encouraged to walk as much as you can. You can slowly return to normal activities during the month after your surgery. Avoid strenuous activity and heavy lifting until your doctor tells you it's OK. Avoid activities such as vacuuming or swinging a golf club. Do not drive until your doctor give the OK and you are no longer taking prescription pain medications. It is also normal to have difficulty with sleep habits, eating and bowel movement after surgery. These will go away with time.  Bathing/Showering  Shower daily after you go home. Do not soak in a bathtub, hot tub, or swim until the incision heals completely.  Incision Care  Clean your incision with mild soap and water. Shower every day. Pat the area dry with a clean towel. You do not need a bandage unless otherwise instructed. Do not apply any ointments or creams to your incision. If you have open wounds you will be instructed how to care for them or a visiting nurse may be arranged for you. If you have staples or sutures along your incision they will be removed at your post-op appointment.  You may have skin glue on your incision. Do not peel it off. It will come off on its own in about one week.  Keep Pravena wound vac on your groin incision until it loses it seal in about 7-10 days.  Once that happens, you can remove and then wash the groin wound with soap and water daily and pat dry. (No tub bath-only shower)  Then put a dry gauze or washcloth in the groin  to keep this area dry to help prevent wound infection.  Do this daily and as needed.  Do not use Vaseline or neosporin on your incisions.  Only use soap and water on your incisions and then protect and keep dry.   Diet  Resume your normal diet. There are no special food restrictions following this procedure. A low fat/ low cholesterol diet is recommended for all patients with vascular disease. In order to heal from your surgery, it is CRITICAL to get adequate nutrition. Your body requires vitamins, minerals, and protein. Vegetables are the best source of vitamins and minerals. Vegetables also provide the perfect balance of protein. Processed food has little nutritional value, so try to avoid this.  Medications  Resume taking all your medications unless your doctor or physician assistant tells you not to. If your incision is causing pain, you may take over-the-counter pain relievers such as acetaminophen  (Tylenol ). If you were prescribed a stronger pain medication, please aware these medication can cause nausea and constipation. Prevent nausea by taking the medication with a snack or meal. Avoid constipation by drinking plenty of fluids and eating foods with high amount of fiber, such as fruits, vegetables, and grains. Take Colace 100 mg (an over-the-counter stool softener) twice a day as needed for constipation.   Do not take Tylenol  if you are taking prescription pain medications.  Follow Up  Our office will schedule a follow up appointment 2-3 weeks following discharge.  Please call us  immediately for any of the following conditions  Severe or worsening pain in your legs or feet while at rest or while walking Increase pain, redness, warmth, or drainage (pus) from your incision site(s) Fever of 101 degree or higher The swelling in your leg with the bypass suddenly worsens and becomes more painful than when you were in the hospital If you have been instructed to feel your graft pulse then you  should do so every day. If you can no longer feel this pulse, call the office immediately. Not all patients are given this instruction.  Leg swelling is common after leg bypass surgery.  The swelling should improve over a few months following surgery. To improve the swelling, you may elevate your legs above the level of your heart while you are sitting or resting. Your surgeon or physician assistant may ask you to apply an ACE wrap or wear compression (TED) stockings to help to reduce swelling.  Reduce your risk of vascular disease  Stop smoking. If you would like help call [facility] at 1-800-QUIT-NOW (934 521 0565) or [place] at [phone].  Manage your cholesterol Maintain a desired weight Control your diabetes weight Control your diabetes Keep your blood pressure down  If you have any questions, please call the office at [phone]

## 2024-02-21 NOTE — Progress Notes (Signed)
  Work Note    02/21/2024 8:00 AM 2 Days Post-Op  Jorge Kelly was under the [facility] care at [facility] from 7/16-7/18. He will need to be out of work for 3 weeks following his procedure to allow him to recover.   Teretha Damme, PA-C [facility] [phone] 02/21/2024 8:00 AM

## 2024-02-21 NOTE — Progress Notes (Addendum)
  Progress Note    02/21/2024 7:56 AM 2 Days Post-Op  Subjective:  feeling overall    Vitals:   02/21/24 0344 02/21/24 0731  BP: 115/80 119/81  Pulse: 79 78  Resp: 18 12  Temp: 97.7 F (36.5 C) 98.1 F (36.7 C)  SpO2: 96% 94%   Physical Exam: Cardiac:  regular Lungs:  non labored Incisions:  left groin incision with prevena vac in place, good seal. JP with 50 cc SS output  Extremities:  LLE remains well perfused and warm with palpable DP Abdomen:  soft Neurologic: alert and oriented  CBC    Component Value Date/Time   WBC 4.7 02/20/2024 0330   RBC 3.93 (L) 02/20/2024 0330   HGB 12.5 (L) 02/20/2024 0330   HGB 15.3 08/24/2021 0902   HCT 36.4 (L) 02/20/2024 0330   HCT 42.8 08/24/2021 0902   PLT 133 (L) 02/20/2024 0330   PLT 171 08/24/2021 0902   MCV 92.6 02/20/2024 0330   MCV 88 08/24/2021 0902   MCH 31.8 02/20/2024 0330   MCHC 34.3 02/20/2024 0330   RDW 12.8 02/20/2024 0330   RDW 14.3 08/24/2021 0902   LYMPHSABS 1.3 02/19/2024 1244   LYMPHSABS 1.9 05/23/2021 0000   MONOABS 0.6 02/19/2024 1244   EOSABS 0.2 02/19/2024 1244   EOSABS 0.2 05/23/2021 0000   BASOSABS 0.0 02/19/2024 1244   BASOSABS 0.1 05/23/2021 0000    BMET    Component Value Date/Time   NA 139 02/21/2024 0239   NA 139 09/22/2021 0929   K 3.7 02/21/2024 0239   CL 108 02/21/2024 0239   CO2 23 02/21/2024 0239   GLUCOSE 126 (H) 02/21/2024 0239   BUN 13 02/21/2024 0239   BUN 21 09/22/2021 0929   CREATININE 0.95 02/21/2024 0239   CREATININE 0.83 04/18/2016 1443   CALCIUM  8.8 (L) 02/21/2024 0239   GFRNONAA >60 02/21/2024 0239   GFRAA >90 05/22/2014 0330    INR    Component Value Date/Time   INR 1.0 04/18/2016 1443     Intake/Output Summary (Last 24 hours) at 02/21/2024 0756 Last data filed at 02/21/2024 0600 Gross per 24 hour  Intake --  Output 133 ml  Net -133 ml     Assessment/Plan:  68 y.o. male is s/p #1: Repair of left femoral pseudoaneurysm #2: Open exposure of left   femoral artery #3: Prevena wound VAC  2 Days Post-Op   Left groin remains with minimal pain Left groin incision with prevena VAC to suction. Good seal JP with 50 cc output this morning. Will see trend of output today. May be able to pull later today Eliquis  restarted this morning H&H stable Mobilize as tolerated Hopefully can d/c later today   Teretha Damme, PA-C [facility] [phone] 02/21/2024 7:56 AM   I agree with the above.  I have seen and evaluated the patient.  Eliquis  was restarted this morning.  We will monitor his JP output plan for discharge later today.  Depending on drain output I may leave this in over the weekend   [employer]

## 2024-02-21 NOTE — TOC Transition Note (Signed)
 Transition of Care (TOC) - Discharge Note Rayfield Gobble RN, BSN Inpatient Care Management Unit 4E- RN Case Manager See Treatment Team for direct phone #   Patient Details  Name: Jorge Kelly MRN: [id number] Date of Birth: [birth year]  Transition of Care [facility]) CM/SW Contact:  Gobble Rayfield Hurst, RN Phone Number: 02/21/2024, 2:57 PM   Clinical Narrative:    Pt stable for transition home today, Family to transport home.   Pt will go home w/ prevena VAC, no HH or DME needs noted. Pt will f/u with [facility] office.   No TOC needs noted.    Final next level of care: Home/Self Care Barriers to Discharge: No Barriers Identified   Patient Goals and CMS Choice Patient states their goals for this hospitalization and ongoing recovery are:: return home   Choice offered to / list presented to : NA      Discharge Placement               Home        Discharge Plan and Services Additional resources added to the After Visit Summary for     Discharge Planning Services: CM Consult Post Acute Care Choice: NA          DME Arranged: N/A DME Agency: NA       HH Arranged: NA HH Agency: NA        Social Drivers of Health (SDOH) Interventions SDOH Screenings   Food Insecurity: Patient Declined (02/20/2024)  Housing: Unknown (02/20/2024)  Transportation Needs: Patient Declined (02/20/2024)  Utilities: Patient Declined (02/20/2024)  Depression (PHQ2-9): Medium Risk (04/04/2023)  Social Connections: Patient Declined (02/20/2024)  Tobacco Use: Low Risk  (02/19/2024)     Readmission Risk Interventions    02/21/2024    2:57 PM  Readmission Risk Prevention Plan  Post Dischage Appt Complete  Medication Screening Complete  Transportation Screening Complete

## 2024-02-21 NOTE — Progress Notes (Signed)
 Lilypad replaced this morning due to air leak alarm on Previna.  Pt noted since last trip to the bathroom previna has alarming air leak again.  Patched up the groin site with remaining neg pressure dressing.  Pt educated to keep dry gauze to groin site if the vac dressing fails this weekend.  Pt and wife also educated on jp drain maintenance.    Discharge orders received. IVs and telemetry removed. CCMD notified.  Pt and wife provided discharge education.  No further questions at this time.

## 2024-02-23 NOTE — Discharge Summary (Signed)
 Discharge Summary  Patient ID: Jorge Kelly [id number] 68 y.o. 05-08-1956  Admit date: 02/19/2024  Discharge date and time: 02/21/2024  3:17 PM   Admitting Physician: Gaile LELON New, MD   Discharge Physician: Gaile LELON New, MD  Admission Diagnoses: Pseudoaneurysm (HCC) [I72.9] On apixaban  therapy [Z79.01] Pseudoaneurysm of left femoral artery (HCC) [I72.4]  Discharge Diagnoses: Pseudoaneurysm (HCC) [I72.9] On apixaban  therapy [Z79.01] Pseudoaneurysm of left femoral artery (HCC) [I72.4]  Admission Condition: serious  Discharged Condition: good  Indication for Admission: This is a 68 year old gentleman who underwent A-fib ablation in January. He had been doing well until about 2 weeks ago when he started having a increasing bulge in his left groin which he thought was his known hernia. The pain became too bad this morning and so he came to the hospital. CT scan showed a large pseudoaneurysm. We discussed going to the operating room tonight for repair. He is on Eliquis . His last dose was last night. He was treated with Kcentra    Hospital Course: Mr. Teall was admitted on 02/19/24 and underwent repair of left femoral pseudoaneurysm, open exposure of left femoral artery and application of Prevena Wound VAC and insertion of 58F Blake Drain by Dr. New.  He tolerated the surgery well and was taken to the recovery room in stable condition.   02/20/24: He remained stable post operatively. No overnight issues. No pain. Left groin with Prevena VAC to suction. Good seal obtained. JP with 50 cc bright bloody output.  BLE remained well perfused and warm with palpable DP pulses. Hemodynamically stable. Cleared to mobilize as tolerated. Evaluated by PT/ OT with no follow up recommendations.   02/21/24: progressing well. Left groin incision with Prevena vac in place. Still with 50 cc output but more serosanguinous. Left leg well perfused and warm with palpable DP pulse. Vitals and labs stable.  Eliquis  restarted. Tolerated mobilizing with mobility specialist very well. Ultimately patient remained stable and elected to discharge home with the [facility] in place. He was provided instructions on care for both the drain and also the Prevena VAC. He will follow up in our office on Monday to have the JP drain removed. Likely can also have Prevena removed at that time. He will resume all home medications as prescribed. Patient without any pain and requested no pain medications at time of discharge. He is okay with Acetaminophen  if needed. He otherwise remained stable for discharge home.   Consults: None  Treatments: antibiotics: Ancef , analgesia: acetaminophen , anticoagulation: Eliquis , therapies: PT, OT, RN, and SW, and surgery:  #1: Repair of left femoral pseudoaneurysm #2: Open exposure of left  femoral artery #3: Prevena wound VAC   Disposition: Discharge disposition: 01-Home or Self Care       Patient Instructions:  Allergies as of 02/21/2024   No Known Allergies      Medication List     TAKE these medications    acetaminophen  500 MG tablet Commonly known as: TYLENOL  Take 1,000 mg by mouth every 6 (six) hours as needed for moderate pain or headache.   apixaban  5 MG Tabs tablet Commonly known as: Eliquis  Take 1 tablet (5 mg total) by mouth 2 (two) times daily.   diltiazem  120 MG 24 hr capsule Commonly known as: Cardizem  CD Take 1 capsule (120 mg total) by mouth daily.   fexofenadine 180 MG tablet Commonly known as: ALLEGRA Take 180 mg by mouth daily as needed for allergies or rhinitis.   Repatha  SureClick 140 MG/ML Soaj Generic drug: Evolocumab   Inject 140 mg into the skin every 14 (fourteen) days.   rizatriptan  10 MG disintegrating tablet Commonly known as: MAXALT -MLT TAKE DAILY AS NEEDED FOR MIGRAINE MAY REPEAT IN 2 HOURS IF NEEDED X1 What changed: See the new instructions.   rosuvastatin  20 MG tablet Commonly known as: CRESTOR  Take 1 tablet (20 mg total)  by mouth daily.   Zepbound  2.5 MG/0.5ML Pen Generic drug: tirzepatide  Inject 2.5 mg into the skin once a week.               Discharge Care Instructions  (From admission, onward)           Start     Ordered   02/21/24 0000  Discharge wound care:       Comments: Keep Pravena wound vac on your groin incision until it loses it seal in about 7-10 days.  Once that happens, you can remove and then wash the groin wound with soap and water daily and pat dry. (No tub bath-only shower)  Then put a dry gauze or washcloth in the groin to keep this area dry to help prevent wound infection.  Do this daily and as needed.  Do not use Vaseline or neosporin on your incisions.  Only use soap and water on your incisions and then protect and keep dry.  Empty JP drain as needed if bulb becomes full. Please keep a record of the amount of output from the drain and bring it to your office follow up   02/21/24 1359           Activity: activity as tolerated, no driving while on analgesics, and no heavy lifting for 6 weeks Diet: regular diet and cardiac diet Wound Care: Keep Pravena wound vac on your groin incision until it loses it seal in about 7-10 days.  Once that happens, you can remove and then wash the groin wound with soap and water daily and pat dry. (No tub bath-only shower)  Then put a dry gauze or washcloth in the groin to keep this area dry to help prevent wound infection.  Do this daily and as needed.  Do not use Vaseline or neosporin on your incisions.  Only use soap and water on your incisions and then protect and keep dry.   Follow-up with [facility] in 2 days for JP drain removal and incision check.  SignedBETHA Teretha Damme, PA-C 02/23/2024 7:06 PM [facility] Office: [phone]

## 2024-02-24 ENCOUNTER — Encounter: Payer: Self-pay | Admitting: Physician Assistant

## 2024-02-24 ENCOUNTER — Ambulatory Visit: Attending: Surgery | Admitting: Physician Assistant

## 2024-02-24 VITALS — BP 138/90 | HR 78 | Temp 98.0°F | Wt 231.6 lb

## 2024-02-24 DIAGNOSIS — I724 Aneurysm of artery of lower extremity: Secondary | ICD-10-CM

## 2024-02-24 NOTE — Progress Notes (Signed)
  POST OPERATIVE OFFICE NOTE    CC:  F/u for surgery  HPI:  This is a 68 y.o. male who is s/p Repair of left femoral artery psa with JP drain and Prevena on 02/19/2024 by Dr. Serene.  He was restarted on his Eliquis .  He was discharged home and scheduled to return for JP drain removal today.   Pt returns today for follow up and here with his wife.  Pt states he has been having a lot of drainage out of the JP drain.  He states he doesn't really have pain in his groin just some discomfort.  Pain improved from pre-op.  He denies any pain in the left foot. He has hx of ankle surgery on the left.    No Known Allergies  Current Outpatient Medications  Medication Sig Dispense Refill   acetaminophen  (TYLENOL ) 500 MG tablet Take 1,000 mg by mouth every 6 (six) hours as needed for moderate pain or headache.     apixaban  (ELIQUIS ) 5 MG TABS tablet Take 1 tablet (5 mg total) by mouth 2 (two) times daily. 180 tablet 3   diltiazem  (CARDIZEM  CD) 120 MG 24 hr capsule Take 1 capsule (120 mg total) by mouth daily. 90 capsule 3   Evolocumab  (REPATHA  SURECLICK) 140 MG/ML SOAJ Inject 140 mg into the skin every 14 (fourteen) days. (Patient not taking: Reported on 02/20/2024) 2 mL 11   fexofenadine (ALLEGRA) 180 MG tablet Take 180 mg by mouth daily as needed for allergies or rhinitis.     rizatriptan  (MAXALT -MLT) 10 MG disintegrating tablet TAKE DAILY AS NEEDED FOR MIGRAINE MAY REPEAT IN 2 HOURS IF NEEDED X1 (Patient taking differently: Take 10 mg by mouth as needed for migraine. Take daily as needed for migraine May repeat in 2 hours if needed x1) 9 tablet 2   rosuvastatin  (CRESTOR ) 20 MG tablet Take 1 tablet (20 mg total) by mouth daily. 90 tablet 0   tirzepatide  (ZEPBOUND ) 2.5 MG/0.5ML Pen Inject 2.5 mg into the skin once a week. 2 mL 5   No current facility-administered medications for this visit.     ROS:  See HPI  Physical Exam:  Today's Vitals   02/24/24 1000  BP: (!) 138/90  Pulse: 78  Temp: 98  F (36.7 C)  TempSrc: Temporal  Weight: 231 lb 9.6 oz (105.1 kg)  PainSc: 0-No pain   Body mass index is 30.14 kg/m.   Incision:  left groin incision looks good with staples in tact.  JP in tact with serosanguinous drainage present.   Extremities:  palpable left AT pulse     Assessment/Plan:  This is a 68 y.o. male who is s/p: Repair of left femoral artery psa with JP drain and Prevena on 02/19/2024 by Dr. Serene  -left groin incision is healing nicely.   -his JP drain has serosanguinous drainage.  He had 270 cc out a few days ago.  Over 150cc yesterday.  He emptied 60 cc this am and the bulb is about 1/2 full currently.   -will leave drain today and have him return on Thursday to see me for possible removal.  Discussed seroma and possibility of returning to OR if he continues to have increased drainage.  He and his wife expressed understanding.     Lucie Apt, [facility] [phone]   Clinic MD:  Gretta on call MD

## 2024-02-27 ENCOUNTER — Ambulatory Visit: Attending: Vascular Surgery | Admitting: Physician Assistant

## 2024-02-27 VITALS — BP 108/75 | HR 82 | Temp 97.9°F | Wt 227.3 lb

## 2024-02-27 DIAGNOSIS — I724 Aneurysm of artery of lower extremity: Secondary | ICD-10-CM

## 2024-02-27 NOTE — Progress Notes (Signed)
 POST OPERATIVE OFFICE NOTE    CC:  F/u for surgery  HPI:  This is a 68 y.o. male who is s/p repair of left femoral artery psa with JP drain and Prevena on 02/19/2024 by Dr. Serene.   He was restarted on his Eliquis .  He was discharged home and scheduled to return for JP drain removal this past Monday.    When he came into the office, he was having increased drainage in the JP and it was not removed.  He was scheduled for visit today.   Pt returns today for follow up and here with his wife.  Pt states the drainage in the bulb is still up but decreasing.  He is cleansing his incision with soap and water daily.    Drainage as follows: Saturday:  285 cc Sunday:  170 cc Monday:  280 cc Tuesday:  195 cc Wednesday:  135 cc   No Known Allergies  Current Outpatient Medications  Medication Sig Dispense Refill   acetaminophen  (TYLENOL ) 500 MG tablet Take 1,000 mg by mouth every 6 (six) hours as needed for moderate pain or headache.     apixaban  (ELIQUIS ) 5 MG TABS tablet Take 1 tablet (5 mg total) by mouth 2 (two) times daily. 180 tablet 3   diltiazem  (CARDIZEM  CD) 120 MG 24 hr capsule Take 1 capsule (120 mg total) by mouth daily. 90 capsule 3   Evolocumab  (REPATHA  SURECLICK) 140 MG/ML SOAJ Inject 140 mg into the skin every 14 (fourteen) days. (Patient not taking: Reported on 02/20/2024) 2 mL 11   fexofenadine (ALLEGRA) 180 MG tablet Take 180 mg by mouth daily as needed for allergies or rhinitis.     rizatriptan  (MAXALT -MLT) 10 MG disintegrating tablet TAKE DAILY AS NEEDED FOR MIGRAINE MAY REPEAT IN 2 HOURS IF NEEDED X1 (Patient taking differently: Take 10 mg by mouth as needed for migraine. Take daily as needed for migraine May repeat in 2 hours if needed x1) 9 tablet 2   rosuvastatin  (CRESTOR ) 20 MG tablet Take 1 tablet (20 mg total) by mouth daily. 90 tablet 0   tirzepatide  (ZEPBOUND ) 2.5 MG/0.5ML Pen Inject 2.5 mg into the skin once a week. 2 mL 5   No current facility-administered  medications for this visit.     ROS:  See HPI  Physical Exam:  Today's Vitals   02/27/24 0821  BP: 108/75  Pulse: 82  Temp: 97.9 F (36.6 C)  TempSrc: Temporal  Weight: 227 lb 4.8 oz (103.1 kg)  PainSc: 0-No pain   Body mass index is 29.58 kg/m.   Incision:  healing with staples in tact; mild fullness left groin that is soft.     Assessment/Plan:  This is a 68 y.o. male who is s/p:  left femoral artery psa with JP drain and Prevena on 02/19/2024 by Dr. Serene.  -pt seen with Dr. Lanis.  Pt incision is healing well.   -his JP drain had significant output on Monday and drain was left in place.  He returns today and he still has increased drainage but is decreasing as listed above.   -Dr. Lanis discussed with pt to come back next Friday and most likely will get drain out at that time.  He will see Dr. Pearline at that visit as the PA schedule is full.  He will come at 8:15 appt.  He knows to call sooner if he has any issues before then.    Lucie Apt, [facility] [phone]  Clinic MD:  Lanis

## 2024-02-28 NOTE — Telephone Encounter (Signed)
 Pt was seen and admitted to [facility]

## 2024-03-02 ENCOUNTER — Telehealth: Payer: Self-pay

## 2024-03-02 ENCOUNTER — Ambulatory Visit: Attending: Surgery | Admitting: Physician Assistant

## 2024-03-02 DIAGNOSIS — I724 Aneurysm of artery of lower extremity: Secondary | ICD-10-CM

## 2024-03-02 MED ORDER — DOXYCYCLINE HYCLATE 100 MG PO CAPS: 100.0000 mg | ORAL_CAPSULE | Freq: Two times a day (BID) | ORAL | 0 refills | Status: AC

## 2024-03-02 NOTE — Progress Notes (Signed)
  POST OPERATIVE OFFICE NOTE    CC:  F/u for surgery  HPI:  This is a 68 y.o. male who is s/p repair of left femoral artery pseudoaneurysm with JP drain and Prevena on 02/19/2024 by Dr. Serene.  He has been seen in the office twice since discharge from the hospital.  Both times drainage output from JP has been too high for JP removal.  Unfortunately his JP lost seal yesterday and he was added to clinic schedule today for evaluation.  He reports copious amounts of thin yellowish drainage from the drain exit site.  He is cleansing the incision with soap and water on a daily basis.  He denies any fevers, chills, nausea/vomiting.  He is on Eliquis  for atrial of fibrillation.  No Known Allergies  Current Outpatient Medications  Medication Sig Dispense Refill   doxycycline  (VIBRAMYCIN ) 100 MG capsule Take 1 capsule (100 mg total) by mouth 2 (two) times daily for 7 days. 14 capsule 0   acetaminophen  (TYLENOL ) 500 MG tablet Take 1,000 mg by mouth every 6 (six) hours as needed for moderate pain or headache.     apixaban  (ELIQUIS ) 5 MG TABS tablet Take 1 tablet (5 mg total) by mouth 2 (two) times daily. 180 tablet 3   diltiazem  (CARDIZEM  CD) 120 MG 24 hr capsule Take 1 capsule (120 mg total) by mouth daily. 90 capsule 3   Evolocumab  (REPATHA  SURECLICK) 140 MG/ML SOAJ Inject 140 mg into the skin every 14 (fourteen) days. (Patient not taking: Reported on 02/20/2024) 2 mL 11   fexofenadine (ALLEGRA) 180 MG tablet Take 180 mg by mouth daily as needed for allergies or rhinitis.     rizatriptan  (MAXALT -MLT) 10 MG disintegrating tablet TAKE DAILY AS NEEDED FOR MIGRAINE MAY REPEAT IN 2 HOURS IF NEEDED X1 (Patient taking differently: Take 10 mg by mouth as needed for migraine. Take daily as needed for migraine May repeat in 2 hours if needed x1) 9 tablet 2   rosuvastatin  (CRESTOR ) 20 MG tablet Take 1 tablet (20 mg total) by mouth daily. 90 tablet 0   tirzepatide  (ZEPBOUND ) 2.5 MG/0.5ML Pen Inject 2.5 mg into the  skin once a week. 2 mL 5   No current facility-administered medications for this visit.     ROS:  See HPI  Physical Exam:  Incision:  L groin incision well appearing with some redness distally Neuro: A&O  Assessment/Plan:  This is a 68 y.o. male who is s/p: L femoral artery pseudoaneurysm repair after ablation procedure in January of this year  JP drain has come out at least 10 cm from initial placement.  JP was removed.  There is thin serous drainage from the drain exit site with manipulation of the incision.  We will try to manage this conservatively with dry dressing changes as needed throughout the day.  He will also continue to cleanse the incision with soap and water daily.  I have prescribed him a week of doxycycline  due to peri-incisional redness.  He will keep his office appointment on Friday to reevaluate.  He is aware he may require return to the operating room for another washout with likely wound VAC placement.   Donnice Sender, PA-C [facility] [phone]  Clinic MD:  Lanis on call

## 2024-03-02 NOTE — Telephone Encounter (Signed)
 Spoke with patient, he stated that the JP drain had lost suction and that he was having foul smelling drainage from the wound bed. He stated that this had started yesterday afternoon and that he had reached out to the on call. I reviewed above information with Matt,PA-C and he advised that patient should come into the office this morning to be seen. Patient scheduled for 9am, he accepted this appointment. Patient denied any fever, redness or swelling at this time.

## 2024-03-05 NOTE — Progress Notes (Unsigned)
  POST OPERATIVE OFFICE NOTE    CC:  F/u for surgery  HPI:  This is a 68 y.o. male who is s/p repair of left femoral artery pseudoaneurysm with JP drain and Prevena on 02/19/2024 by Dr. Serene.  He was seen in the office twice since discharge from the hospital however both times output from JP has been too high to discontinue.  Unfortunately JP drain lost seal and he was seen in the clinic by myself on Monday of this week.  The JP drain was completely removed and he was instructed on wound care with dry dressing changes daily and as needed.  He was also started on a short course of doxycycline .  ***    No Known Allergies  Current Outpatient Medications  Medication Sig Dispense Refill   acetaminophen  (TYLENOL ) 500 MG tablet Take 1,000 mg by mouth every 6 (six) hours as needed for moderate pain or headache.     apixaban  (ELIQUIS ) 5 MG TABS tablet Take 1 tablet (5 mg total) by mouth 2 (two) times daily. 180 tablet 3   diltiazem  (CARDIZEM  CD) 120 MG 24 hr capsule Take 1 capsule (120 mg total) by mouth daily. 90 capsule 3   doxycycline  (VIBRAMYCIN ) 100 MG capsule Take 1 capsule (100 mg total) by mouth 2 (two) times daily for 7 days. 14 capsule 0   Evolocumab  (REPATHA  SURECLICK) 140 MG/ML SOAJ Inject 140 mg into the skin every 14 (fourteen) days. (Patient not taking: Reported on 02/20/2024) 2 mL 11   fexofenadine (ALLEGRA) 180 MG tablet Take 180 mg by mouth daily as needed for allergies or rhinitis.     rizatriptan  (MAXALT -MLT) 10 MG disintegrating tablet TAKE DAILY AS NEEDED FOR MIGRAINE MAY REPEAT IN 2 HOURS IF NEEDED X1 (Patient taking differently: Take 10 mg by mouth as needed for migraine. Take daily as needed for migraine May repeat in 2 hours if needed x1) 9 tablet 2   rosuvastatin  (CRESTOR ) 20 MG tablet Take 1 tablet (20 mg total) by mouth daily. 90 tablet 0   tirzepatide  (ZEPBOUND ) 2.5 MG/0.5ML Pen Inject 2.5 mg into the skin once a week. 2 mL 5   No current facility-administered  medications for this visit.     ROS:  See HPI  Physical Exam:  There were no vitals filed for this visit.***  Incision:  *** Extremities:  *** Neuro: *** Abdomen:  ***  Assessment/Plan:  This is a 68 y.o. male who is s/p: Left common femoral artery pseudoaneurysm repair with subsequent serous drainage  -***   Donnice Sender, PA-C [facility] [phone]  Clinic MD:  Pearline

## 2024-03-06 ENCOUNTER — Encounter: Payer: Self-pay | Admitting: Physician Assistant

## 2024-03-06 ENCOUNTER — Ambulatory Visit: Attending: Vascular Surgery | Admitting: Physician Assistant

## 2024-03-06 ENCOUNTER — Ambulatory Visit: Admitting: Vascular Surgery

## 2024-03-06 VITALS — BP 121/83 | HR 83 | Temp 98.1°F | Wt 227.6 lb

## 2024-03-06 DIAGNOSIS — I724 Aneurysm of artery of lower extremity: Secondary | ICD-10-CM

## 2024-03-11 ENCOUNTER — Encounter: Payer: Self-pay | Admitting: Physician Assistant

## 2024-03-11 ENCOUNTER — Ambulatory Visit: Attending: Cardiology | Admitting: Cardiology

## 2024-03-11 ENCOUNTER — Ambulatory Visit: Admitting: Physician Assistant

## 2024-03-11 VITALS — BP 116/79 | HR 75 | Ht 73.5 in | Wt 226.0 lb

## 2024-03-11 DIAGNOSIS — D6869 Other thrombophilia: Secondary | ICD-10-CM

## 2024-03-11 DIAGNOSIS — I729 Aneurysm of unspecified site: Secondary | ICD-10-CM

## 2024-03-11 DIAGNOSIS — E7841 Elevated Lipoprotein(a): Secondary | ICD-10-CM

## 2024-03-11 DIAGNOSIS — I724 Aneurysm of artery of lower extremity: Secondary | ICD-10-CM

## 2024-03-11 DIAGNOSIS — I77819 Aortic ectasia, unspecified site: Secondary | ICD-10-CM

## 2024-03-11 DIAGNOSIS — R931 Abnormal findings on diagnostic imaging of heart and coronary circulation: Secondary | ICD-10-CM | POA: Diagnosis not present

## 2024-03-11 DIAGNOSIS — I2510 Atherosclerotic heart disease of native coronary artery without angina pectoris: Secondary | ICD-10-CM

## 2024-03-11 DIAGNOSIS — I4819 Other persistent atrial fibrillation: Secondary | ICD-10-CM | POA: Diagnosis not present

## 2024-03-11 MED ORDER — DOXYCYCLINE HYCLATE 100 MG PO CAPS: 100.0000 mg | ORAL_CAPSULE | Freq: Two times a day (BID) | ORAL | 0 refills | Status: DC

## 2024-03-11 NOTE — Progress Notes (Addendum)
 POST OPERATIVE OFFICE NOTE    CC:  F/u for surgery  HPI:  This is a 68 y.o. male who is s/p  repair of left femoral artery psa with JP drain and Prevena on 02/19/2024 by Dr. Serene. He was restarted on his Eliquis  for afib. He was discharged home and scheduled to return for JP drain removal.  He continued to have high JP output.  This was eventually removed after the JP drain lost its seal.  He was seen back on 8/1 and the drain exit site stopped draining about 24 hours after the JP was removed.  He did have an increase in fluid collection in the left groin but no excessive pain.  He still had a few days left of the Doxycycline . On exam, he did have a seroma but no skin compromise.  Every other staple was removed due to this.    He was upstairs at another appt and called to see if we could remove staples as they have been aggrevating.  He was added on as an add on.  Pt states one of the staples has been bothering him.  He continues to have clear yellow drainage.  He has finished his abx.  He denies any fevers.  He states he does get some numbness around his left ankle since surgery.    No Known Allergies  Current Outpatient Medications  Medication Sig Dispense Refill   acetaminophen  (TYLENOL ) 500 MG tablet Take 1,000 mg by mouth every 6 (six) hours as needed for moderate pain or headache.     apixaban  (ELIQUIS ) 5 MG TABS tablet Take 1 tablet (5 mg total) by mouth 2 (two) times daily. 180 tablet 3   diltiazem  (CARDIZEM  CD) 120 MG 24 hr capsule Take 1 capsule (120 mg total) by mouth daily. 90 capsule 3   Evolocumab  (REPATHA  SURECLICK) 140 MG/ML SOAJ Inject 140 mg into the skin every 14 (fourteen) days. (Patient not taking: Reported on 03/11/2024) 2 mL 11   fexofenadine (ALLEGRA) 180 MG tablet Take 180 mg by mouth daily as needed for allergies or rhinitis.     rizatriptan  (MAXALT -MLT) 10 MG disintegrating tablet TAKE DAILY AS NEEDED FOR MIGRAINE MAY REPEAT IN 2 HOURS IF NEEDED X1 9 tablet 2    rosuvastatin  (CRESTOR ) 20 MG tablet Take 1 tablet (20 mg total) by mouth daily. 90 tablet 0   tirzepatide  (ZEPBOUND ) 2.5 MG/0.5ML Pen Inject 2.5 mg into the skin once a week. 2 mL 5   No current facility-administered medications for this visit.     ROS:  See HPI  Physical Exam:   Incision:    Extremities:  palpable left PT and DP pulse     Assessment/Plan:  This is a 68 y.o. male who is s/p:  left femoral artery psa with JP drain and Prevena on 02/19/2024 by Dr. Serene. He was restarted on his Eliquis  for afib. He was discharged home and scheduled to return for JP drain removal.  -pt seen with Dr. Sheree.  He did probe the pinpoint area seen above with drainage of serous fluid.  The area was packed with iodoform gauze and I instructed pt how to do this.  Also instructed him to leave a wick and not pack completely in the wound.  Staple medial to pinpoint area removed.   -he will keep appt on 03/16/2024 for wound check and possible removal of rest of staples.  Pt did have some relief after drainage of seroma.  Discussed with pt that he  still may need trip to OR for washout but will continue to monitor for now.  He expressed good understanding.  -given prolonged seroma, I did send another week of Doxycycline  to his pharmacy.  -he has easily palpable left DP and PT pulses.    Lucie Apt, [facility] [phone]   Clinic MD:  Sheree

## 2024-03-11 NOTE — Patient Instructions (Signed)

## 2024-03-11 NOTE — Progress Notes (Signed)
 Cardiology Office Note:  .   Date:  03/11/2024  ID:  Jorge Kelly, DOB 10-May-1956, MRN [id number] PCP: Katrinka Garnette KIDD, MD  [facility] Providers Cardiologist:  Oneil Parchment, MD Electrophysiologist:  Will Gladis Norton, MD     History of Present Illness: Jorge   ULRICH Kelly is a 68 y.o. male Discussed the use of AI scribe software for clinical note transcription with the patient, who gave verbal consent to proceed.  History of Present Illness Jorge Kelly is a 68 year old male with a history of atrial fibrillation and coronary artery disease who presents for follow-up after left femoral artery pseudoaneurysm repair.  He underwent a repair of a left femoral artery pseudoaneurysm post AFIB ablation. The pseudoaneurysm was initially mistaken for a hernia due to its location under an existing hernia.  He is concerned about the possibility of needing a second operation and mentions that the wound would be left open to heal if that were the case. He has left groin pain and a seroma at the site of the repair.  He has a history of atrial fibrillation, for which he underwent ablation in 2022 and again in 2025. There has been no recurrence of atrial fibrillation since the last ablation. He continues to take Eliquis  5 mg twice daily for chronic anticoagulation. He was taken off Eliquis  for two days during an emergency surgery and given a counteractive drug. He describes significant improvement in his symptoms since the ablation, stating that he no longer experiences debilitating shortness of breath and can walk without difficulty.  He has a history of coronary artery disease with a coronary calcium  score of 1330. His LDL is well controlled at 49, but his LP(a) is elevated at 117, above the goal of less than 75. He has been prescribed Repatha  but has not started it yet due to recent medical events. He continues to take rosuvastatin  20 mg daily. He mentions a family history of heart disease, with his  mother's six brothers dying before the age of 64 from heart-related issues.  He has a history of aortic dilatation, with the aortic root measuring 4.5 cm and the ascending aorta 4.1 cm as of 2025. He does not report any family history of aneurysms but notes significant heart disease in his family.     Studies Reviewed: .        Results LABS LDL: 49 Lipoprotein(a): 117  RADIOLOGY Coronary calcium  score: 1330 Aortic root measurement: 4.5 cm Ascending aorta measurement: 4.1 cm CT scan: Large hyperdense mass in left inguinal region, lateral to left inguinal canal, 9x9 cm, with contrast enhancement and possible bleeding  DIAGNOSTIC Event monitor: No evidence of atrial fibrillation (11/19/2023) Risk Assessment/Calculations:            Physical Exam:   VS:  BP 116/79 (BP Location: Left Arm, Cuff Size: Normal)   Pulse 75   Ht 6' 1.5 (1.867 m)   Wt 226 lb (102.5 kg)   BMI 29.41 kg/m    Wt Readings from Last 3 Encounters:  03/11/24 226 lb (102.5 kg)  03/06/24 227 lb 9.6 oz (103.2 kg)  02/27/24 227 lb 4.8 oz (103.1 kg)    GEN: Well nourished, well developed in no acute distress NECK: No JVD; No carotid bruits CARDIAC: RRR, no murmurs, no rubs, no gallops RESPIRATORY:  Clear to auscultation without rales, wheezing or rhonchi  ABDOMEN: Soft, non-tender, non-distended EXTREMITIES:  No edema; No deformity   ASSESSMENT AND PLAN: .  Assessment and Plan Assessment & Plan Status post left femoral artery pseudoaneurysm repair with ongoing wound care Status post repair of left femoral artery pseudoaneurysm with ongoing wound care. A seroma is present, and there is concern about the potential need for a second operation.  He is in communication with the vascular surgery team regarding wound care and potential further intervention. - Continue wound care as directed by the vascular surgery team - Consult with vascular surgery team if concerns about wound healing or need for further  intervention  Atrial fibrillation, status post ablation, on chronic anticoagulation Atrial fibrillation with ablation performed in 2022 and 2025. Currently, no evidence of atrial fibrillation on event monitor. Continues on chronic anticoagulation with Eliquis  5 mg twice daily. No recurrence of atrial fibrillation despite recent surgery and stressors. Reports significant improvement in symptoms and quality of life since ablation. - Continue Eliquis  5 mg twice daily for anticoagulation  Coronary atherosclerosis with elevated coronary calcium  score and elevated Lp(a) Coronary atherosclerosis with a coronary calcium  score of 1330 and elevated Lp(a) at 117, above the goal of less than 75. Family history of heart disease noted. LDL well controlled at 49. Repatha  (PCSK9 inhibitor) recommended and prior authorization approved, but not yet started due to recent medical events and concerns about potential reactions. Plans to start Repatha  once current medical issues stabilize. Repatha  is considered safe with no significant problems reported. - Start Repatha  as soon as he feels comfortable, after recent medical events have stabilized - Continue monitoring LDL levels  Aortic root and ascending aorta dilation Mild dilation of the aortic root at 4.5 cm and ascending aorta at 4.1 cm noted on CT scan. No family history of aneurysms, but significant family history of heart disease. - Schedule follow-up imaging with echocardiogram or CT scan to monitor aortic dilation         Dispo: 1 yr  Signed, Oneil Parchment, MD

## 2024-03-16 ENCOUNTER — Ambulatory Visit: Attending: Surgery | Admitting: Physician Assistant

## 2024-03-16 ENCOUNTER — Encounter: Payer: Self-pay | Admitting: Physician Assistant

## 2024-03-16 VITALS — BP 139/94 | HR 76 | Temp 98.1°F

## 2024-03-16 DIAGNOSIS — I724 Aneurysm of artery of lower extremity: Secondary | ICD-10-CM

## 2024-03-16 MED ORDER — DOXYCYCLINE HYCLATE 100 MG PO CAPS: 100.0000 mg | ORAL_CAPSULE | Freq: Two times a day (BID) | ORAL | 0 refills | Status: DC

## 2024-03-16 NOTE — Progress Notes (Signed)
 POST OPERATIVE OFFICE NOTE    CC:  F/u for surgery  HPI:  This is a 68 y.o. male who is s/p repair of left femoral artery psa with JP drain and Prevena on 02/19/2024 by Dr. Serene. He was restarted on his Eliquis  for afib. He was discharged home and scheduled to return for JP drain removal.   He continued to have high JP output.  This was eventually removed after the JP drain lost its seal.  He was seen back on 8/1 and the drain exit site stopped draining about 24 hours after the JP was removed.  He did have an increase in fluid collection in the left groin but no excessive pain.  He still had a few days left of the Doxycycline . On exam, he did have a seroma but no skin compromise.  Every other staple was removed due to this.    Pt was seen on 03/11/2024 and incision was probed with CTA and drainage of serous fluid.  This area was wicked with iodoform, pt was taught how to do this and scheduled to return today.  He was continued with Doxycycline .  He had easily palpable pulses in the left foot.  One rouge staple was removed and others left in place.   Pt returns today for follow up and here with his wife.  Pt states the drainage has gotten less and less since last Wednesday.  He denies any fevers. He has been packing the wound with iodoform gauze daily.     No Known Allergies  Current Outpatient Medications  Medication Sig Dispense Refill   acetaminophen  (TYLENOL ) 500 MG tablet Take 1,000 mg by mouth every 6 (six) hours as needed for moderate pain or headache.     apixaban  (ELIQUIS ) 5 MG TABS tablet Take 1 tablet (5 mg total) by mouth 2 (two) times daily. 180 tablet 3   diltiazem  (CARDIZEM  CD) 120 MG 24 hr capsule Take 1 capsule (120 mg total) by mouth daily. 90 capsule 3   doxycycline  (VIBRAMYCIN ) 100 MG capsule Take 1 capsule (100 mg total) by mouth 2 (two) times daily. 14 capsule 0   Evolocumab  (REPATHA  SURECLICK) 140 MG/ML SOAJ Inject 140 mg into the skin every 14 (fourteen) days. (Patient  not taking: Reported on 03/11/2024) 2 mL 11   fexofenadine (ALLEGRA) 180 MG tablet Take 180 mg by mouth daily as needed for allergies or rhinitis.     rizatriptan  (MAXALT -MLT) 10 MG disintegrating tablet TAKE DAILY AS NEEDED FOR MIGRAINE MAY REPEAT IN 2 HOURS IF NEEDED X1 9 tablet 2   rosuvastatin  (CRESTOR ) 20 MG tablet Take 1 tablet (20 mg total) by mouth daily. 90 tablet 0   tirzepatide  (ZEPBOUND ) 2.5 MG/0.5ML Pen Inject 2.5 mg into the skin once a week. 2 mL 5   No current facility-administered medications for this visit.     ROS:  See HPI  Physical Exam:  Today's Vitals   03/16/24 1052  BP: (!) 139/94  Pulse: 76  Temp: 98.1 F (36.7 C)  TempSrc: Temporal  PainSc: 0-No pain   There is no height or weight on file to calculate BMI.   Incision:   Left groin-unable to express any drainage from wound.       Assessment/Plan:  This is a 68 y.o. male who is s/p: repair of left femoral artery psa with JP drain and Prevena on 02/19/2024 by Dr. Serene. He was restarted on his Eliquis  for afib. He was discharged home and scheduled to return for  JP drain removal.  -pt seen with Dr. Harris Plume removed today.  Unable to express any drainage.  He will continue with the iodoform gauze daily. Dr. Serene would like for him to continue abx until this has completely healed.  I sent this to the [facility] pharmacy on [address].  -will have him return in 2 weeks on Dr. Serene clinic day for wound check.     Lucie Apt, [facility] [phone]   Clinic MD:  Serene

## 2024-03-19 ENCOUNTER — Ambulatory Visit
Admission: RE | Admit: 2024-03-19 | Discharge: 2024-03-19 | Disposition: A | Discharge status: Home / Self Care | Source: Ambulatory Visit | Attending: Urology | Admitting: Urology

## 2024-03-19 DIAGNOSIS — R972 Elevated prostate specific antigen [PSA]: Secondary | ICD-10-CM

## 2024-03-19 MED ORDER — GADOPICLENOL 0.5 MMOL/ML IV SOLN
10.0000 mL | Freq: Once | INTRAVENOUS | Status: AC | PRN
  Administered 2024-03-19: 10 mL via INTRAVENOUS

## 2024-03-30 ENCOUNTER — Encounter: Payer: Self-pay | Admitting: Family Medicine

## 2024-03-30 ENCOUNTER — Ambulatory Visit: Attending: Surgery | Admitting: Physician Assistant

## 2024-03-30 VITALS — BP 120/79 | HR 79 | Temp 98.2°F | Wt 226.6 lb

## 2024-03-30 DIAGNOSIS — I724 Aneurysm of artery of lower extremity: Secondary | ICD-10-CM

## 2024-03-30 NOTE — Progress Notes (Signed)
 POST OPERATIVE OFFICE NOTE    CC:  F/u for surgery  HPI:  Jorge Kelly is a 68 y.o. male who is here for repeat wound check.  He recently underwent open repair of left femoral artery pseudoaneurysm with JP drain and Prevena wound VAC placement on 02/19/2024 by Dr. Serene.  He was discharged home after his surgery and scheduled to return to our office for JP drain removal.  Postoperatively his JP drain had high output and was eventually removed after it lost its seal.  After drain removal he did develop a seroma without skin compromise in his left groin.  During his office visit on 8/6 his incision was probed with return of serous fluid.  This area was then packed with iodoform gauze.  He was instructed to change this packing daily.  At his last office visit, his groin wound was making good progress and healing.  He had significantly less drainage.  The remainder of his staples were removed.  He was instructed to continue oral antibiotics until his groin wound was completely healed.  He returns today for repeat wound check.  He is doing great at today's visit.  He says a couple of days ago he stopped being able to pack his groin with iodoform gauze.  He has kept the area clean and dry since then.  He has not had any drainage from his groin for several days now.  He denies any fevers, chills, tenderness, or redness.  He also feels like the swelling in his left groin is slowly improving.   No Known Allergies  Current Outpatient Medications  Medication Sig Dispense Refill   acetaminophen  (TYLENOL ) 500 MG tablet Take 1,000 mg by mouth every 6 (six) hours as needed for moderate pain or headache.     apixaban  (ELIQUIS ) 5 MG TABS tablet Take 1 tablet (5 mg total) by mouth 2 (two) times daily. 180 tablet 3   diltiazem  (CARDIZEM  CD) 120 MG 24 hr capsule Take 1 capsule (120 mg total) by mouth daily. 90 capsule 3   doxycycline  (VIBRAMYCIN ) 100 MG capsule Take 1 capsule (100 mg total) by mouth 2 (two) times  daily. 28 capsule 0   Evolocumab  (REPATHA  SURECLICK) 140 MG/ML SOAJ Inject 140 mg into the skin every 14 (fourteen) days. (Patient not taking: Reported on 03/11/2024) 2 mL 11   fexofenadine (ALLEGRA) 180 MG tablet Take 180 mg by mouth daily as needed for allergies or rhinitis.     rizatriptan  (MAXALT -MLT) 10 MG disintegrating tablet TAKE DAILY AS NEEDED FOR MIGRAINE MAY REPEAT IN 2 HOURS IF NEEDED X1 9 tablet 2   rosuvastatin  (CRESTOR ) 20 MG tablet Take 1 tablet (20 mg total) by mouth daily. 90 tablet 0   tirzepatide  (ZEPBOUND ) 2.5 MG/0.5ML Pen Inject 2.5 mg into the skin once a week. 2 mL 5   No current facility-administered medications for this visit.     ROS:  See HPI  Physical Exam:  Incision: Left groin incision well-healed  Extremities: Left groin with soft area of swelling without tenderness or erythema.  No pulsatile mass.  Palpable pedal pulses bilaterally Neuro: Alert and oriented x 3    Assessment/Plan:  This is a 68 y.o. male who is here for repeat wound check  - The patient returns for repeat wound check today after recent repair of left femoral artery pseudoaneurysm, with postoperative seroma complications - His left groin incision is now well-healed without signs of infection.  There is no recurrent pulsatile mass in the left  groin.  He still does have swelling in the left groin, however this has no overlying skin changes and the swelling has decreased in size. - He denies any fevers or chills.  He denies any tenderness to the left groin. - He does not need any more antibiotics after he finishes his most recently prescribed course of doxycycline  - He can follow-up with Dr. Serene in 3 months with left groin duplex   Jorge Holster, PA-C [facility] [phone]   Clinic MD:  Serene

## 2024-04-01 ENCOUNTER — Other Ambulatory Visit: Payer: Self-pay

## 2024-04-01 DIAGNOSIS — I724 Aneurysm of artery of lower extremity: Secondary | ICD-10-CM

## 2024-04-13 ENCOUNTER — Telehealth: Payer: Self-pay | Admitting: *Deleted

## 2024-04-13 NOTE — Telephone Encounter (Signed)
   Pre-operative Risk Assessment    Patient Name: Jorge Kelly  DOB: [birth year] MRN: [id number]   Date of last office visit: 03/11/24 DR. SKAINS Date of next office visit: NONE   Request for Surgical Clearance    Procedure:  MRI PROSTATE Bx  Date of Surgery:  Clearance TBD                                Surgeon:  DR. NORLEEN SELTZER Surgeon's Group or Practice Name:  [facility] Phone number:  [phone] Fax number:  [phone]   Type of Clearance Requested:   - Medical  - Pharmacy:  Hold Apixaban  (Eliquis ) x 3 DAYS PRIOR TO PROCEDURE   Type of Anesthesia:  Not Indicated   Additional requests/questions:    Bonney Niels Jest   04/13/2024, 2:31 PM

## 2024-04-16 NOTE — Telephone Encounter (Signed)
 Hi Dr. Jeffrie You saw this pt 03/11/24 who now needs a prostate biopsy. We are asked for risk stratification. He has LFA PSA repair following Afib ablation Jan 2025. He has a history of CAD with elevated calcium  score, but no ischemic evaluation. He has aortic dilatation with 4.5 cm root and 4.1 cm aorta, demonstrated on CT prior to afib ablation.   Do you recommend additional testing prior to biopsy?

## 2024-04-16 NOTE — Telephone Encounter (Signed)
 Patient with diagnosis of atrial fibrillation on Eliquis  for anticoagulation.    Procedure:  MRI PROSTATE Bx   Date of Surgery:  Clearance TBD        CHA2DS2-VASc Score = 2   This indicates a 2.2% annual risk of stroke. The patient's score is based upon: CHF History: 0 HTN History: 0 Diabetes History: 0 Stroke History: 0 Vascular Disease History: 1 Age Score: 1 Gender Score: 0    CrCl 111 Platelet count 133  Patient has not had an Afib/aflutter ablation or Watchman within the last 3 months or DCCV within the last 30 days   Per office protocol, patient can hold Eliquis  for 3 days prior to procedure.   Patient will not need bridging with Lovenox (enoxaparin) around procedure.  **This guidance is not considered finalized until pre-operative APP has relayed final recommendations.**

## 2024-04-17 NOTE — Telephone Encounter (Signed)
   Patient Name: Jorge Kelly  DOB: [birth year] MRN: [id number]  Primary Cardiologist: Oneil Parchment, MD  Chart reviewed as part of pre-operative protocol coverage. Given past medical history and time since last visit, based on ACC/AHA guidelines, Jorge Kelly is at acceptable risk for the planned procedure without further cardiovascular testing.   Per office protocol, patient can hold Eliquis  for 3 days prior to procedure.   Patient will not need bridging with Lovenox (enoxaparin) around procedure.  The patient was advised that if he develops new symptoms prior to surgery to contact our office to arrange for a follow-up visit, and he verbalized understanding.  I will route this recommendation to the requesting party via Epic fax function and remove from pre-op pool.  Please call with questions.  Lamarr Satterfield, NP 04/17/2024, 2:02 PM

## 2024-04-18 ENCOUNTER — Other Ambulatory Visit: Payer: Self-pay | Admitting: Cardiology

## 2024-04-18 ENCOUNTER — Other Ambulatory Visit: Payer: Self-pay | Admitting: Physician Assistant

## 2024-04-18 DIAGNOSIS — Z8249 Family history of ischemic heart disease and other diseases of the circulatory system: Secondary | ICD-10-CM

## 2024-04-18 DIAGNOSIS — D6869 Other thrombophilia: Secondary | ICD-10-CM

## 2024-04-18 DIAGNOSIS — I480 Paroxysmal atrial fibrillation: Secondary | ICD-10-CM

## 2024-04-18 DIAGNOSIS — I2510 Atherosclerotic heart disease of native coronary artery without angina pectoris: Secondary | ICD-10-CM

## 2024-04-18 DIAGNOSIS — Z79899 Other long term (current) drug therapy: Secondary | ICD-10-CM

## 2024-04-18 DIAGNOSIS — Z5181 Encounter for therapeutic drug level monitoring: Secondary | ICD-10-CM

## 2024-04-18 DIAGNOSIS — I77819 Aortic ectasia, unspecified site: Secondary | ICD-10-CM

## 2024-04-18 DIAGNOSIS — E785 Hyperlipidemia, unspecified: Secondary | ICD-10-CM

## 2024-04-18 DIAGNOSIS — E782 Mixed hyperlipidemia: Secondary | ICD-10-CM

## 2024-04-18 DIAGNOSIS — I10 Essential (primary) hypertension: Secondary | ICD-10-CM

## 2024-04-20 NOTE — Telephone Encounter (Signed)
 Eliquis  5mg  refill request received. Patient is 68 years old, weight-102.8kg, Crea-0.95 on 02/21/24, Diagnosis-Afib, and last seen by Dr. Jeffrie on 03/11/24. Dose is appropriate based on dosing criteria. Will send in refill to requested pharmacy.

## 2024-05-04 ENCOUNTER — Other Ambulatory Visit: Payer: Self-pay | Admitting: Family Medicine

## 2024-05-04 ENCOUNTER — Other Ambulatory Visit: Payer: Self-pay

## 2024-05-04 ENCOUNTER — Other Ambulatory Visit (HOSPITAL_COMMUNITY): Payer: Self-pay

## 2024-05-04 DIAGNOSIS — R931 Abnormal findings on diagnostic imaging of heart and coronary circulation: Secondary | ICD-10-CM

## 2024-05-04 DIAGNOSIS — E669 Obesity, unspecified: Secondary | ICD-10-CM

## 2024-05-04 MED ORDER — ZEPBOUND 2.5 MG/0.5ML ~~LOC~~ SOAJ
2.5000 mg | SUBCUTANEOUS | 5 refills | Status: DC
  Filled 2024-05-04 – 2024-05-22 (×2): qty 2, 28d supply, fill #0

## 2024-05-14 ENCOUNTER — Other Ambulatory Visit (HOSPITAL_COMMUNITY): Payer: Self-pay

## 2024-05-22 ENCOUNTER — Other Ambulatory Visit (HOSPITAL_COMMUNITY): Payer: Self-pay

## 2024-05-26 ENCOUNTER — Other Ambulatory Visit: Payer: Self-pay | Admitting: Unknown Physician Specialty

## 2024-06-02 ENCOUNTER — Encounter: Payer: Self-pay | Admitting: Family Medicine

## 2024-06-02 ENCOUNTER — Ambulatory Visit: Admitting: Family Medicine

## 2024-06-02 VITALS — BP 124/88 | HR 89 | Temp 97.7°F | Ht 73.5 in | Wt 211.6 lb

## 2024-06-02 DIAGNOSIS — I480 Paroxysmal atrial fibrillation: Secondary | ICD-10-CM | POA: Diagnosis not present

## 2024-06-02 DIAGNOSIS — I2510 Atherosclerotic heart disease of native coronary artery without angina pectoris: Secondary | ICD-10-CM

## 2024-06-02 DIAGNOSIS — Z23 Encounter for immunization: Secondary | ICD-10-CM

## 2024-06-02 DIAGNOSIS — R931 Abnormal findings on diagnostic imaging of heart and coronary circulation: Secondary | ICD-10-CM

## 2024-06-02 DIAGNOSIS — E782 Mixed hyperlipidemia: Secondary | ICD-10-CM

## 2024-06-02 DIAGNOSIS — Z6827 Body mass index (BMI) 27.0-27.9, adult: Secondary | ICD-10-CM

## 2024-06-02 DIAGNOSIS — E669 Obesity, unspecified: Secondary | ICD-10-CM | POA: Diagnosis not present

## 2024-06-02 MED ORDER — ZEPBOUND 2.5 MG/0.5ML ~~LOC~~ SOAJ: 2.5000 mg | SUBCUTANEOUS | 11 refills | Status: DC

## 2024-06-02 NOTE — Progress Notes (Signed)
 Phone [phone] In person visit   Subjective:   Jorge Kelly is a 68 y.o. year old very pleasant male patient who presents for/with See problem oriented charting Chief Complaint  Patient presents with   Follow-up    Here for Zepbound  follow-up. Feels like it is working well.    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    Priority: Low   Actinic keratosis 10/24/2009    Priority: Low   GERD 05/11/2007    Priority: Low   Pseudoaneurysm 02/19/2024   Pseudoaneurysm of left femoral artery 02/19/2024   Basal cell carcinoma (BCC) of back 01/23/2024   Skin tag 01/23/2024   Agatston CAC score, >400 01/23/2024   Coronary artery disease involving native coronary artery of native heart without angina pectoris 01/23/2024   Melanoma (HCC) 09/27/2023   Eyelid lesion 06/20/2023   Aortic dilatation 04/04/2023   Hypercoagulable state due to persistent atrial fibrillation (HCC) 12/20/2022   Persistent atrial fibrillation (HCC) 04/04/2021    Medications- reviewed and updated Current Outpatient Medications  Medication Sig Dispense Refill   acetaminophen  (TYLENOL ) 500 MG tablet Take 1,000 mg by mouth every 6 (six) hours as needed for moderate pain or headache.     diltiazem  (CARDIZEM  CD) 120 MG 24 hr capsule Take 1 capsule (120 mg total) by mouth daily. 90 capsule 3   ELIQUIS  5 MG TABS tablet TAKE 1 TABLET BY MOUTH TWICE A DAY 180 tablet 3   Evolocumab  (REPATHA  SURECLICK) 140 MG/ML SOAJ Inject 140 mg into the skin every 14 (fourteen) days. 2  mL 11   rizatriptan  (MAXALT -MLT) 10 MG disintegrating tablet TAKE DAILY AS NEEDED FOR MIGRAINE MAY REPEAT IN 2 HOURS IF NEEDED X1 9 tablet 2   rosuvastatin  (CRESTOR ) 20 MG tablet TAKE 1 TABLET BY MOUTH EVERY DAY 90 tablet 3   tirzepatide  (ZEPBOUND ) 2.5 MG/0.5ML Pen Inject 2.5 mg into the skin once a week. 2 mL 11   No current facility-administered medications for this visit.     Objective:  BP 124/88 (BP Location: Left Arm, Patient Position: Sitting, Cuff Size: Normal)   Pulse 89   Temp 97.7 F (36.5 C) (Tympanic)   Ht 6' 1.5 (1.867 m)   Wt 211 lb 9.6 oz (96 kg)   SpO2 98%   BMI 27.54 kg/m  Gen: NAD, resting comfortably CV: RRR no murmurs rubs or gallops Lungs: CTAB no crackles, wheeze, rhonchi Ext: no edema Skin: warm, dry     Assessment and Plan   # Elevated PSA-has seen urology most recent visit earlier this month-they are planning on biopsy with Dr. Watt- had this on last Tuesday and waiting on results Lab Results  Component Value Date   PSA 5.24 (H) 12/02/2023   PSA 2.80 08/20/2019   PSA 2.16 11/20/2013   #Overweight Down for obesity range S: Patient has had a phenomenal response to Zepbound -down from 257 pounds to 211 at this point.  This also likely lowers his cardiac risk currently on 2.5  mg dose . Took edge off of appetite- by weekend cravings increase -also walking or rowing Wt Readings from Last 3 Encounters:  06/02/24 211 lb 9.6 oz (96 kg)  03/30/24 226 lb 9.6 oz (102.8 kg)  03/11/24 226 lb (102.5 kg)  A/P: phenomenal improvement- wants to push down to 195-200 on home scales- 205 right now. Continue current medications but if he needs me to go to 5 mg certainly can and I like the cardioprotective benefit of medication as well for him  -after his surgery has had some change in bowel habits- needs OTC (available over the counter without a prescription) treatment for constipation at times- does not recall name of drug -Encouraged need for healthy eating, regular  exercise, weight loss.   #pseudoaneurysm- tough recovery from July 2025- has close follow up with vascular and sees again on 06/22/24. Able to walk and row now but still swollen.  -still has hernia that he wants to address when feels better  #melanoma on eyelid he thinks in 2024- had this with [facility] - actually all removed with biopsy    Atrial fibrillation-2015 diagnosis- ablation helpful late 2024 #Chronic anticoagulation S: Rate controlled with diltiazem  120 mg extended release.  Anticoagulated with Eliquis  5 mg twice daily  A/P: appropriately anticoagulated and rate controlled- continue current medicine - actually appears to be staying in sinus   #hyperlipidemia-LDL goal under 70-under 55 even more ideal with moderately elevated LPA  - CAC score of 1330 which is 94th percentile for age-, race-, and sex-matched controls January 2025. No chest pain or shortness of breath with this S: Medication: Rosuvastatin  20 mg daily , Repatha  added in 2025 with elevated lipoprotein (a) - started about 2 months ago  Lab Results  Component Value Date   CHOL 113 12/02/2023   HDL 46.00 12/02/2023   LDLCALC 49 12/02/2023   LDLDIRECT 96.1 09/02/2013   TRIG 90.0 12/02/2023   CHOLHDL 2 12/02/2023  A/P: lipids at goal but now on Repatha  with lipoprotein (a) elevation- hopefully will reduce his long term risk  Coronary artery disease asymptomatic continue current medications   Recommended follow up: Return in about 6 months (around 12/01/2024) for physical or sooner if needed.Schedule b4 you leave. Future Appointments  Date Time Provider Department Center  06/22/2024 11:00 AM [facility]  06/22/2024 11:40 AM Serene Gaile ORN, MD [facility]    Lab/Order associations:   ICD-10-CM   1. Immunization due  Z23 Flu vaccine HIGH DOSE PF(Fluzone Trivalent)    2. Obesity (BMI 30-39.9)  E66.9 tirzepatide  (ZEPBOUND ) 2.5 MG/0.5ML Pen    3. Elevated coronary artery calcium  score  R93.1  tirzepatide  (ZEPBOUND ) 2.5 MG/0.5ML Pen      Meds ordered this encounter  Medications   tirzepatide  (ZEPBOUND ) 2.5 MG/0.5ML Pen    Sig: Inject 2.5 mg into the skin once a week.    Dispense:  2 mL    Refill:  11    Return precautions advised.  Garnette Lukes, MD

## 2024-06-02 NOTE — Patient Instructions (Addendum)
 Great job on raytheon loss and navigating these medical issues! You are doing quite well all things considered!   Recommended follow up: Return in about 6 months (around 12/01/2024) for physical or sooner if needed.Schedule b4 you leave.

## 2024-06-16 ENCOUNTER — Other Ambulatory Visit: Payer: Self-pay

## 2024-06-16 ENCOUNTER — Other Ambulatory Visit (HOSPITAL_COMMUNITY): Payer: Self-pay

## 2024-06-16 ENCOUNTER — Encounter: Payer: Self-pay | Admitting: Family Medicine

## 2024-06-16 DIAGNOSIS — R931 Abnormal findings on diagnostic imaging of heart and coronary circulation: Secondary | ICD-10-CM

## 2024-06-16 DIAGNOSIS — E669 Obesity, unspecified: Secondary | ICD-10-CM

## 2024-06-16 MED ORDER — ZEPBOUND 2.5 MG/0.5ML ~~LOC~~ SOAJ
2.5000 mg | SUBCUTANEOUS | 11 refills | Status: AC
  Filled 2024-06-16 – 2024-06-23 (×2): qty 2, 28d supply, fill #0
  Filled 2024-07-13 – 2024-07-20 (×2): qty 2, 28d supply, fill #1
  Filled 2024-08-05 – 2024-08-20 (×3): qty 2, 28d supply, fill #2

## 2024-06-22 ENCOUNTER — Ambulatory Visit: Admitting: Surgery

## 2024-06-22 ENCOUNTER — Ambulatory Visit (HOSPITAL_COMMUNITY)

## 2024-06-22 ENCOUNTER — Encounter: Payer: Self-pay | Admitting: Surgery

## 2024-06-22 ENCOUNTER — Ambulatory Visit (HOSPITAL_COMMUNITY)
Admission: RE | Admit: 2024-06-22 | Discharge: 2024-06-22 | Disposition: A | Discharge status: Home / Self Care | Source: Ambulatory Visit | Attending: Surgery | Admitting: Surgery

## 2024-06-22 VITALS — BP 124/85 | HR 81 | Temp 98.3°F | Ht 73.5 in | Wt 213.0 lb

## 2024-06-22 DIAGNOSIS — I724 Aneurysm of artery of lower extremity: Secondary | ICD-10-CM | POA: Insufficient documentation

## 2024-06-22 NOTE — Progress Notes (Signed)
 [facility] of [place]  Patient name: Jorge Kelly MRN: [id number] DOB: May 06, 1956 Sex: male   REASON FOR VISIT:    Follow up  HISOTRY OF PRESENT ILLNESS:    Jorge Kelly is a 68 y.o. male who I met in the emergency department in July 2025 when he presented with a 2-week history of left groin pain.  A CT scan showed a large left femoral pseudoaneurysm.  He has a history of cardiac ablation in January 2025 that was complicated by some bleeding.  He is on Eliquis  for atrial fibrillation.  He was taken to the operating room on 02/19/2024 for repair of a 10 cm pseudoaneurysm that was coming off of the common femoral artery at the level of the bifurcation.  He did have a hernia that I had general surgery look at in the operating room.  No treatment was necessary at this time.  I did leave a JP drain at the time of surgery.  He is no longer having any drainage.  He does have a little bit of a fullness in the left groin which he feels is getting smaller   He has recently been diagnosed with stage I prostate cancer   PAST MEDICAL HISTORY:   Past Medical History:  Diagnosis Date   Actinic keratosis 10/24/2009   ALLERGIC RHINITIS 05/11/2007   ELEVATED BLOOD PRESSURE WITHOUT DIAGNOSIS OF HYPERTENSION 11/12/2007   Dr. Mona mentioned HTN on problem list   Esophageal stricture    treated with protonix    GERD 05/11/2007   GI bleeding    20 years ago   Kidney stone    Melanoma (HCC) 01/2014   remoted from forehead   MIGRAINE HEADACHE 05/11/2007   Overweight    Persistent atrial fibrillation (HCC) 05/2014   Snores      FAMILY HISTORY:   Family History  Problem Relation Age of Onset   Lymphoma Mother    Atrial fibrillation Mother        pacemaker in 10s   Leukemia Father        passed from CLL   Cancer Sister        treated, cancer free   Diabetes type II Sister    Heart disease Maternal Uncle        mother lost 6 brothers  to heart disease in 4s    SOCIAL HISTORY:   Social History   Tobacco Use   Smoking status: Never    Passive exposure: Past   Smokeless tobacco: Never   Tobacco comments:    Never smoke 08/15/22  Substance Use Topics   Alcohol use: No    Alcohol/week: 0.0 standard drinks of alcohol     ALLERGIES:   No Known Allergies   CURRENT MEDICATIONS:   Current Outpatient Medications  Medication Sig Dispense Refill   acetaminophen  (TYLENOL ) 500 MG tablet Take 1,000 mg by mouth every 6 (six) hours as needed for moderate pain or headache.     diltiazem  (CARDIZEM  CD) 120 MG 24 hr capsule Take 1 capsule (120 mg total) by mouth daily. 90 capsule 3   ELIQUIS  5 MG TABS tablet TAKE 1 TABLET BY MOUTH TWICE A DAY 180 tablet 3   Evolocumab  (REPATHA  SURECLICK) 140 MG/ML SOAJ Inject 140 mg into the skin every 14 (fourteen) days. 2 mL 11   rizatriptan  (MAXALT -MLT) 10 MG disintegrating tablet TAKE DAILY AS NEEDED FOR MIGRAINE MAY REPEAT IN 2 HOURS IF NEEDED X1 9 tablet 2   rosuvastatin  (  CRESTOR ) 20 MG tablet TAKE 1 TABLET BY MOUTH EVERY DAY 90 tablet 3   tirzepatide  (ZEPBOUND ) 2.5 MG/0.5ML Pen Inject 2.5 mg into the skin once a week. 2 mL 11   No current facility-administered medications for this visit.    REVIEW OF SYSTEMS:   [X]  denotes positive finding, [ ]  denotes negative finding Cardiac  Comments:  Chest pain or chest pressure:    Shortness of breath upon exertion:    Short of breath when lying flat:    Irregular heart rhythm:        Vascular    Pain in calf, thigh, or hip brought on by ambulation:    Pain in feet at night that wakes you up from your sleep:     Blood clot in your veins:    Leg swelling:         Pulmonary    Oxygen at home:    Productive cough:     Wheezing:         Neurologic    Sudden weakness in arms or legs:     Sudden numbness in arms or legs:     Sudden onset of difficulty speaking or slurred speech:    Temporary loss of vision in one eye:     Problems  with dizziness:         Gastrointestinal    Blood in stool:     Vomited blood:         Genitourinary    Burning when urinating:     Blood in urine:        Psychiatric    Major depression:         Hematologic    Bleeding problems:    Problems with blood clotting too easily:        Skin    Rashes or ulcers:        Constitutional    Fever or chills:      PHYSICAL EXAM:   Vitals:   06/22/24 0949  BP: 124/85  Pulse: 81  Temp: 98.3 F (36.8 C)  SpO2: 98%  Weight: 213 lb (96.6 kg)  Height: 6' 1.5 (1.867 m)    GENERAL: The patient is a well-nourished male, in no acute distress. The vital signs are documented above. CARDIAC: There is a regular rate and rhythm.  VASCULAR: Left groin seroma without infection or drainage PULMONARY: Non-labored respirations ABDOMEN: Soft and non-tender with normal pitched bowel sounds.  MUSCULOSKELETAL: There are no major deformities or cyanosis. NEUROLOGIC: No focal weakness or paresthesias are detected. SKIN: There are no ulcers or rashes noted. PSYCHIATRIC: The patient has a normal affect.  STUDIES:   I have reviewed the following: A mixed echogenic structure measuring approximately 1.5 cm x 1.2 cm is  visualized at the left groin with ultrasound characteristics of a  hematoma.      MEDICAL ISSUES:   Left femoral pseudoaneurysm repair: No residual evidence of aneurysmal degeneration is visualized on ultrasound.  He still has a small seroma which appears to be getting better.  I discussed the indications for repair with the his desire or if it were to get infected.  This is not bothering him now and so we will continue to observe it with the anticipation that it will resolve  With regards to his left inguinal hernia, he is not having any discomfort from this.  I have encouraged him to see general surgery if it starts to bother him.  He will follow-up me in  1 year    Malvina Serene CLORE, MD, FACS [facility]  [facility] [phone] Pager [phone]

## 2024-06-23 ENCOUNTER — Other Ambulatory Visit (HOSPITAL_COMMUNITY): Payer: Self-pay

## 2024-07-01 ENCOUNTER — Telehealth: Payer: Self-pay

## 2024-07-01 NOTE — Telephone Encounter (Signed)
 I called pt to introduce myself as  the Coordinator of the Prostate [facility].   1. I confirmed with the patient he is aware of his referral to the clinic 12/12, arriving @ 8:00 a.m..    2. I discussed the format of the clinic and the physicians he will be seeing that day.   3. I discussed where the clinic is located and how to contact me.   4. I confirmed his address and informed him I would be mailing a packet of information and forms to be completed. I asked him to bring them with him the day of his appointment.    He voiced understanding of the above. I asked him to call me if he has any questions or concerns regarding his appointments or the forms he needs to complete.

## 2024-07-13 ENCOUNTER — Other Ambulatory Visit (HOSPITAL_COMMUNITY): Payer: Self-pay

## 2024-07-13 ENCOUNTER — Other Ambulatory Visit: Payer: Self-pay

## 2024-07-16 LAB — SURGICAL PATHOLOGY

## 2024-07-16 NOTE — Progress Notes (Signed)
 Radiation Oncology         [phone] ________________________________  Multidisciplinary Prostate Cancer Clinic  Initial Radiation Oncology Consultation  Name: Jorge Kelly MRN: [id number]  Date: 07/17/2024  DOB: [birth year]  RR:Ylwuzm, Garnette KIDD, MD  Watt Rush, MD   REFERRING PHYSICIAN: Watt Rush, MD  DIAGNOSIS: 68 y.o. gentleman with stage T1c adenocarcinoma of the prostate with a Gleason's score of 3+3 and a PSA of 5.07    ICD-10-CM   1. Malignant neoplasm of prostate (HCC)  C61       HISTORY OF PRESENT ILLNESS::Jorge Kelly is a 68 y.o. gentleman.  He was noted to have an elevated PSA of 5.24 on routine labs 12/02/2023 by his primary care physician, Dr. Katrinka.  Accordingly, he was referred for evaluation in urology by Dr. Watt on 01/20/24,  digital rectal examination performed at that time showed an enlarged prostate without nodules or induration. A repeat PSA on 01/29/2024 showed persistent elevation at 5.07 so underwent a prostate MRI on 03/19/24 showing bilateral PI-RADS 3 nodules in the anterior central gland with left-sided nodule showing concurrent mild capsular bulging but no pelvic adenopathy. The patient proceeded to MRI fusion biopsy of the prostate on 05/26/24.  The prostate volume measured 136.4 cc.  Out of 14 core biopsies, 2 were positive.  The maximum Gleason score was 3+3, and this was seen in both MRI ROIs.  The patient reviewed the biopsy results with his urologist and he has kindly been referred today to the multidisciplinary prostate cancer clinic for presentation of pathology and radiology studies in our conference for discussion of potential radiation treatment options and clinical evaluation.  He is accompanied by his wife, Jorge Kelly, for today's visit.  PREVIOUS RADIATION THERAPY: No  PAST MEDICAL HISTORY:  has a past medical history of Actinic keratosis (10/24/2009), ALLERGIC RHINITIS (05/11/2007), ELEVATED BLOOD PRESSURE WITHOUT DIAGNOSIS OF HYPERTENSION  (11/12/2007), Elevated PSA, Esophageal stricture, GERD (05/11/2007), GI bleeding, Kidney stone, Melanoma (HCC) (01/2014), MIGRAINE HEADACHE (05/11/2007), Overweight, Persistent atrial fibrillation (HCC) (05/2014), Prostate cancer (HCC), and Snores.    PAST SURGICAL HISTORY: Past Surgical History:  Procedure Laterality Date   ANKLE FRACTURE SURGERY  08/06/2008   x2-left   APPLICATION OF WOUND VAC Left 02/19/2024   Procedure: APPLICATION PREVENA WOUND VAC LEFT GROIN;  Surgeon: Serene Gaile ORN, MD;  Location: [facility] OR;  Service: Vascular;  Laterality: Left;   ATRIAL FIBRILLATION ABLATION N/A 06/15/2021   Procedure: ATRIAL FIBRILLATION ABLATION;  Surgeon: Kelsie Agent, MD;  Location: [facility] INVASIVE CV LAB;  Service: Cardiovascular;  Laterality: N/A;   ATRIAL FIBRILLATION ABLATION N/A 08/21/2023   Procedure: ATRIAL FIBRILLATION ABLATION;  Surgeon: Inocencio Soyla Lunger, MD;  Location: [facility] INVASIVE CV LAB;  Service: Cardiovascular;  Laterality: N/A;   CARDIOVERSION N/A 06/23/2014   CARDIOVERSION N/A 09/09/2014   CARDIOVERSION N/A 04/13/2021   Procedure: CARDIOVERSION;  Surgeon: Barbaraann Darryle Ned, MD;  Location[facility] ENDOSCOPY;  Service: Cardiovascular;  Laterality: N/A;   CARDIOVERSION N/A 08/01/2021   Procedure: CARDIOVERSION;  Surgeon: Barbaraann Darryle Ned, MD;  Location: [facility] ENDOSCOPY;  Service: Cardiovascular;  Laterality: N/A;   COLONOSCOPY     FALSE ANEURYSM REPAIR Left 02/19/2024   Procedure: LEFT FEMORAL ARTERY PSEUDOANEURYSM REPAIR;  Surgeon: Serene Gaile ORN, MD;  Location: [facility] OR;  Service: Vascular;  Laterality: Left;  REPAIR OF LEFT FEMORAL PSEUDOANEURYSM   malignant melanoma     removed from forehead in June 2015   PROSTATE BIOPSY     STRABISMUS SURGERY Right 05/21/2014   Dr.  young   UPPER GI ENDOSCOPY     stricture, just PPI    FAMILY HISTORY: family history includes Atrial fibrillation in his mother; Cancer in his sister; Diabetes type II in his sister; Heart disease in his  maternal uncle; Leukemia in his father; Lymphoma in his mother.  SOCIAL HISTORY:  reports that he has never smoked. He has been exposed to tobacco smoke. He has never used smokeless tobacco. He reports that he does not drink alcohol and does not use drugs. Son, Kainalu Heggs, played Lacrosse at [employer]. 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  ALLERGIES: Patient has no known allergies.  MEDICATIONS:  Current Outpatient Medications  Medication Sig Dispense Refill   finasteride (PROSCAR) 5 MG tablet Take 5 mg by mouth daily.     acetaminophen  (TYLENOL ) 500 MG tablet Take 1,000 mg by mouth every 6 (six) hours as needed for moderate pain or headache.     diltiazem  (CARDIZEM  CD) 120 MG 24 hr capsule Take 1 capsule (120 mg total) by mouth daily. 90 capsule 3   ELIQUIS  5 MG TABS tablet TAKE 1 TABLET BY MOUTH TWICE A DAY 180 tablet 3   Evolocumab  (REPATHA  SURECLICK) 140 MG/ML SOAJ Inject 140 mg into the skin every 14 (fourteen) days. 2 mL 11   rizatriptan  (MAXALT -MLT) 10 MG disintegrating tablet TAKE DAILY AS NEEDED FOR MIGRAINE MAY REPEAT IN 2 HOURS IF NEEDED X1 9 tablet 2   rosuvastatin  (CRESTOR ) 20 MG tablet TAKE 1 TABLET BY MOUTH EVERY DAY 90 tablet 3   tirzepatide  (ZEPBOUND ) 2.5 MG/0.5ML Pen Inject 2.5 mg into the skin once a week. 2 mL 11   No current facility-administered medications for this encounter.    REVIEW OF SYSTEMS:  On review of systems, the patient reports that he is doing well overall. He denies any chest pain, shortness of breath, cough, fevers, chills, night sweats, unintended weight changes. He denies any bowel disturbances, and denies abdominal pain, nausea or vomiting. He denies any new musculoskeletal or joint aches or pains. His IPSS was 17, indicating moderate-severe urinary symptoms. His SHIM was 10, indicating he has moderate erectile dysfunction. A complete review of systems is obtained and is otherwise negative.   PHYSICAL EXAM:  Wt Readings from Last 3 Encounters:  07/17/24 214  lb 2 oz (97.1 kg)  06/22/24 213 lb (96.6 kg)  06/02/24 211 lb 9.6 oz (96 kg)   Temp Readings from Last 3 Encounters:  07/17/24 (!) 97.3 F (36.3 C) (Temporal)  06/22/24 98.3 F (36.8 C)  06/02/24 97.7 F (36.5 C) (Tympanic)   BP Readings from Last 3 Encounters:  07/17/24 123/83  06/22/24 124/85  06/02/24 124/88   Pulse Readings from Last 3 Encounters:  07/17/24 83  06/22/24 81  06/02/24 89    /10  In general this is a well appearing Caucasian man in no acute distress. He's alert and oriented x4 and appropriate throughout the examination. Cardiopulmonary assessment is negative for acute distress and he exhibits normal effort.    KPS = 100  100 - Normal; no complaints; no evidence of disease. 90   - Able to carry on normal activity; minor signs or symptoms of disease. 80   - Normal activity with effort; some signs or symptoms of disease. 36   - Cares for self; unable to carry on normal activity or to do active work. 60   - Requires occasional assistance, but is able to care for most of his personal needs. 50   -  Requires considerable assistance and frequent medical care. 40   - Disabled; requires special care and assistance. 30   - Severely disabled; hospital admission is indicated although death not imminent. 20   - Very sick; hospital admission necessary; active supportive treatment necessary. 10   - Moribund; fatal processes progressing rapidly. 0     - Dead  Karnofsky DA, Abelmann WH, Craver LS and Burchenal [facility] [phone]) The use of the nitrogen mustards in the palliative treatment of carcinoma: with particular reference to bronchogenic carcinoma Cancer 1 634-56   LABORATORY DATA:  Lab Results  Component Value Date   WBC 4.7 02/20/2024   HGB 12.5 (L) 02/20/2024   HCT 36.4 (L) 02/20/2024   MCV 92.6 02/20/2024   PLT 133 (L) 02/20/2024   Lab Results  Component Value Date   NA 139 02/21/2024   K 3.7 02/21/2024   CL 108 02/21/2024   CO2 23 02/21/2024   Lab Results   Component Value Date   ALT 11 02/19/2024   AST 12 (L) 02/19/2024   ALKPHOS 75 02/19/2024   BILITOT 1.3 (H) 02/19/2024     RADIOGRAPHY: VAS US  GROIN PSEUDOANEURYSM Result Date: 06/22/2024  ARTERIAL PSEUDOANEURYSM  Patient Name:  GRANVIL DJORDJEVIC  Date of Exam:   06/22/2024 Medical Rec #: [id number]     Accession #:    [id number] Date of Birth: 1955-09-27     Patient Gender: M Patient Age:   65 years Exam Location:  [address] Procedure:      VAS US  QUILLIAN CAPES Referring Phys: GAILE NEW --------------------------------------------------------------------------------  Exam: Left groin History: S/p catheterization. S/P open repair of left femoral artery pseudoaneurysm 02/19/24. Performing Technologist: Edsel Mustard RVT  Examination Guidelines: A complete evaluation includes B-mode imaging, spectral Doppler, color Doppler, and power Doppler as needed of all accessible portions of each vessel. Bilateral testing is considered an integral part of a complete examination. Limited examinations for reoccurring indications may be performed as noted. +-----------+----------+---------+------+----------+ Left DuplexPSV (cm/s)Waveform PlaqueComment(s) +-----------+----------+---------+------+----------+ Ext Iliac      83    triphasic                 +-----------+----------+---------+------+----------+ CFA            65    triphasic                 +-----------+----------+---------+------+----------+ PFA            56    triphasic                 +-----------+----------+---------+------+----------+ Prox SFA       79    triphasic                 +-----------+----------+---------+------+----------+ Left Vein comments: Left external iliac, common femoral and proximal femoral veins are patent with normal respirophasic flow.  Findings: A mixed echogenic structure measuring approximately 1.5 cm x 1.2 cm is visualized at the left groin with ultrasound characteristics of a hematoma.   Summary: No evidence of pseudoaneurysm, AVF or DVT  Diagnosing physician: Gaile New MD Electronically signed by Gaile New MD on 06/22/2024 at 9:53:28 AM.    --------------------------------------------------------------------------------    Final       IMPRESSION/PLAN: 68 y.o. gentleman with Stage T1c adenocarcinoma of the prostate with a Gleason score of 3+3 and a PSA of 5.07.    We discussed the patient's workup and outlined the nature of prostate cancer in this setting. The patient's T stage, Gleason's score,  and PSA put him into the favorable low risk group. Accordingly, he is eligible for a variety of potential treatment options including active surveillance, brachytherapy, 5.5 weeks of external radiation, or prostatectomy. We discussed the available radiation techniques, and focused on the details and logistics of delivery. The patient is not a candidate for brachytherapy with a prostate volume of 136.4 g. We discussed and outlined the risks, benefits, short and long-term effects associated with radiotherapy and compared and contrasted these with prostatectomy. We discussed the role of SpaceOAR gel in reducing the rectal toxicity associated with radiotherapy.  The preferred recommendation is to proceed with active surveillance with consideration for treatment in the future if there is any evidence of disease progression.  He appears to have a good understanding of his disease and our treatment recommendations which are of curative intent.  He and his wife were encouraged to ask questions that were answered to their stated satisfaction.  At the end of the conversation the patient is interested in moving forward with active surveillance.  We will share our discussion with Dr. Watt so that he can make the appropriate arrangements for surveillance going forward.  He has our contact information and will let us  know if he has further questions or concerns regarding the treatment discussion today.  We  enjoyed meeting him and his wife and look forward to continuing to follow along in his care via correspondence.  We personally spent 45 minutes in this encounter including chart review, reviewing radiological studies, meeting face-to-face with the patient, entering orders and completing documentation.    Sabra MICAEL Rusk, PA-C    Donnice Barge, MD  [facility]  Radiation Oncology Direct Dial: [phone]  Fax: [phone] [email or web address]  Skype  [email or web address]   This document serves as a record of services personally performed by Donnice Barge, MD and Sabra Rusk, PA-C. It was created on their behalf by Izetta Neither, a trained medical scribe. The creation of this record is based on the scribe's personal observations and the provider's statements to them. This document has been checked and approved by the attending provider.

## 2024-07-16 NOTE — Progress Notes (Signed)
 RN spoke with patient and reviewed information about upcoming [facility] appointment on 12/12.

## 2024-07-16 NOTE — Progress Notes (Unsigned)
 [facility] CONSULT NOTE  Patient Care Team: Katrinka Garnette KIDD, MD as PCP - General (Family Medicine) Inocencio Soyla Lunger, MD as PCP - Electrophysiology (Cardiology) Jeffrie Oneil BROCKS, MD as PCP - Cardiology (Cardiology) Vertell Pont, RN as Oncology Nurse Navigator  ASSESSMENT & PLAN:  Jorge Kelly is a 68 y.o.male with history of hyperlipidemia, PAF, melanoma, GERD being seen at [facility] for prostate cancer.  Initial diagnosis: prostate adenocarcinoma GS 3+3 =6 in 2 cores of targeted lesions, HGPIN in 1 core.  PSA 5.07. Treatment: active surveillance  His case was discussed at tumor board with multiple specialists including radiation oncologist, urology oncologist, pathologist, radiologist. The patient was counseled on the natural history of prostate cancer and the standard treatment options that are available for prostate cancer.   For low risk per NCCN guideline, in patients with > 10 years of life expectancy, active surveillance, RP or RT are all options resulted in excellent long term survival.  Active surveillance is reasonable.  Patient was encouraged to consider the side effect profiles of each treatment modality and make an informed decision.  Assessment & Plan Primary prostate adenocarcinoma [facility]) Patient will follow-up with radiation oncology or urologic oncology for long-term follow-up surveillance or definitive treatment.  He may follow-up with medical oncology as needed. Healthy lifestyle to prevent diabetes and CV disease Weight-bearing exercises (30 minutes per day) Limit alcohol consumption and avoid smoking  All questions were answered. The patient knows to call the clinic with any problems, questions or concerns. No barriers to learning was detected.  Pauletta BROCKS Chihuahua, MD 12/12/202512:04 PM  CHIEF COMPLAINTS/PURPOSE OF CONSULTATION:  prostate cancer  HISTORY OF PRESENTING ILLNESS:  Jorge Kelly 68 y.o. male is here because of prostate cancer.  I have  reviewed his chart and materials related to his cancer extensively and collaborated history with the patient. Summary of oncologic history is as follows:  Report history of nocturia and frequency with reduced stream.  No hematuria or dysuria.  PSA was 2.08 in 2015 and 2.8 in 2021.  Report PSA was 5.24 on 11/24/2023.  PSA was 5.07 on 01/30/2024.  Exam by Dr. Watt did not show prostate nodule. MRI with 2 PI-RADs-3 lesions.  Prostate biopsy on 05/26/2024 showed prostate adenocarcinoma GS 3+3 =6 in 2 cores of targeted lesions, HGPIN in 1 core.   Oncology History  Primary prostate adenocarcinoma (HCC)  12/02/2023 Tumor Marker   PSA 5.24   03/19/2024 Imaging   MRI prostate 1. Bilateral anterior central gland nodules which are considered PI-RADS(v2.1) -3. The left-sided nodule has concurrent mild capsular bulging and if determined to be a site of carcinoma, small volume transcapsular spread cannot be excluded. 2. No pelvic adenopathy. 3. Diffuse peripheral zone hypermetabolism is nonspecific but can be seen with prostatitis. 4. Improved appearance of the left groin as detailed above. 5. The study was not appropriately put in the DynaCAD software, so fusion process could not be performed at this time.   05/26/2024 Pathology Results   Prostate biopsy prostate adenocarcinoma GS 3+3 =6 in 2 cores of targeted lesions, HGPIN in 1 core   05/26/2024 Cancer Staging   Staging form: Prostate, AJCC 8th Edition - Clinical stage from 05/26/2024: Stage I (cT1c, cN0, cM0, PSA: 5.1, Grade Group: 1) - Signed by Sherwood Rise, PA-C on 07/17/2024 Histopathologic type: Adenocarcinoma, NOS Stage prefix: Initial diagnosis Prostate specific antigen (PSA) range: Less than 10 Gleason primary pattern: 3 Gleason secondary pattern: 3 Gleason score: 6 Histologic grading system: 5 grade  system Number of biopsy cores examined: 14 Number of biopsy cores positive: 2 Location of positive needle core biopsies: Both  sides   07/16/2024 Initial Diagnosis   Primary prostate adenocarcinoma [facility])     MEDICAL HISTORY:  Past Medical History:  Diagnosis Date   Actinic keratosis 10/24/2009   ALLERGIC RHINITIS 05/11/2007   ELEVATED BLOOD PRESSURE WITHOUT DIAGNOSIS OF HYPERTENSION 11/12/2007   Dr. Mona mentioned HTN on problem list   Elevated PSA    Esophageal stricture    treated with protonix    GERD 05/11/2007   GI bleeding    20 years ago   Kidney stone    Melanoma (HCC) 01/2014   remoted from forehead   MIGRAINE HEADACHE 05/11/2007   Overweight    Persistent atrial fibrillation (HCC) 05/2014   Prostate cancer (HCC)    Snores     SURGICAL HISTORY: Past Surgical History:  Procedure Laterality Date   ANKLE FRACTURE SURGERY  08/06/2008   x2-left   APPLICATION OF WOUND VAC Left 02/19/2024   Procedure: APPLICATION PREVENA WOUND VAC LEFT GROIN;  Surgeon: Serene Gaile ORN, MD;  Location: [facility] OR;  Service: Vascular;  Laterality: Left;   ATRIAL FIBRILLATION ABLATION N/A 06/15/2021   Procedure: ATRIAL FIBRILLATION ABLATION;  Surgeon: Kelsie Agent, MD;  Location: [facility] INVASIVE CV LAB;  Service: Cardiovascular;  Laterality: N/A;   ATRIAL FIBRILLATION ABLATION N/A 08/21/2023   Procedure: ATRIAL FIBRILLATION ABLATION;  Surgeon: Inocencio Soyla Lunger, MD;  Location: [facility] INVASIVE CV LAB;  Service: Cardiovascular;  Laterality: N/A;   CARDIOVERSION N/A 06/23/2014   CARDIOVERSION N/A 09/09/2014   CARDIOVERSION N/A 04/13/2021   Procedure: CARDIOVERSION;  Surgeon: Barbaraann Darryle Ned, MD;  Location: [facility] ENDOSCOPY;  Service: Cardiovascular;  Laterality: N/A;   CARDIOVERSION N/A 08/01/2021   Procedure: CARDIOVERSION;  Surgeon: Barbaraann Darryle Ned, MD;  Location: [facility] ENDOSCOPY;  Service: Cardiovascular;  Laterality: N/A;   COLONOSCOPY     FALSE ANEURYSM REPAIR Left 02/19/2024   Procedure: LEFT FEMORAL ARTERY PSEUDOANEURYSM REPAIR;  Surgeon: Serene Gaile ORN, MD;  Location: [facility] OR;  Service: Vascular;  Laterality:  Left;  REPAIR OF LEFT FEMORAL PSEUDOANEURYSM   malignant melanoma     removed from forehead in June 2015   PROSTATE BIOPSY     STRABISMUS SURGERY Right 05/21/2014   Dr. neysa   UPPER GI ENDOSCOPY     stricture, just PPI    SOCIAL HISTORY: Social History   Socioeconomic History   Marital status: Married    Spouse name: Not on file   Number of children: Not on file   Years of education: Not on file   Highest education level: Not on file  Occupational History   Occupation: Attorney  Tobacco Use   Smoking status: Never    Passive exposure: Past   Smokeless tobacco: Never   Tobacco comments:    Never smoke 08/15/22  Vaping Use   Vaping status: Never Used  Substance and Sexual Activity   Alcohol use: No    Alcohol/week: 0.0 standard drinks of alcohol   Drug use: No   Sexual activity: Not on file  Other Topics Concern   Not on file  Social History Narrative   Lives in [place] with wife (wife patient at [facility]).  2 sons.- 20 Logan- [employer]-and 25 Luke at [employer] in [place]- later [employer]     Wife with pregnancy at 34 and 39.       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  Hobbies: sails [place] in [place]    Social Drivers of Health   Tobacco Use: Low Risk (07/17/2024)   Patient History    Smoking Tobacco Use: Never    Smokeless Tobacco Use: Never    Passive Exposure: Past  Financial Resource Strain: Not on file  Food Insecurity: No Food Insecurity (07/17/2024)   Epic    Worried About Programme Researcher, Broadcasting/film/video in the Last Year: Never true    Ran Out of Food in the Last Year: Never true  Transportation Needs: No Transportation Needs (07/17/2024)   Epic    Lack of Transportation (Medical): No    Lack of Transportation (Non-Medical): No  Physical Activity: Not on file  Stress: Not on file  Social Connections: Patient Declined (02/20/2024)   Social Connection and Isolation Panel    Frequency of Communication with Friends and  Family: Patient declined    Frequency of Social Gatherings with Friends and Family: Patient declined    Attends Religious Services: Patient declined    Active Member of Clubs or Organizations: Patient declined    Attends Banker Meetings: Patient declined    Marital Status: Patient declined  Intimate Partner Violence: Not At Risk (07/17/2024)   Epic    Fear of Current or Ex-Partner: No    Emotionally Abused: No    Physically Abused: No    Sexually Abused: No  Depression (PHQ2-9): Low Risk (07/17/2024)   Depression (PHQ2-9)    PHQ-2 Score: 0  Alcohol Screen: Not on file  Housing: Unknown (07/17/2024)   Epic    Unable to Pay for Housing in the Last Year: No    Number of Times Moved in the Last Year: Not on file    Homeless in the Last Year: No  Utilities: Not At Risk (07/17/2024)   Epic    Threatened with loss of utilities: No  Health Literacy: Not on file    FAMILY HISTORY: Family History  Problem Relation Age of Onset   Lymphoma Mother    Atrial fibrillation Mother        pacemaker in 14s   Leukemia Father        passed from CLL   Cancer Sister        treated, cancer free   Diabetes type II Sister    Heart disease Maternal Uncle        mother lost 6 brothers to heart disease in 78s    ALLERGIES:  has no known allergies.  MEDICATIONS:  Current Outpatient Medications  Medication Sig Dispense Refill   acetaminophen  (TYLENOL ) 500 MG tablet Take 1,000 mg by mouth every 6 (six) hours as needed for moderate pain or headache.     diltiazem  (CARDIZEM  CD) 120 MG 24 hr capsule Take 1 capsule (120 mg total) by mouth daily. 90 capsule 3   ELIQUIS  5 MG TABS tablet TAKE 1 TABLET BY MOUTH TWICE A DAY 180 tablet 3   Evolocumab  (REPATHA  SURECLICK) 140 MG/ML SOAJ Inject 140 mg into the skin every 14 (fourteen) days. 2 mL 11   finasteride (PROSCAR) 5 MG tablet Take 5 mg by mouth daily.     rizatriptan  (MAXALT -MLT) 10 MG disintegrating tablet TAKE DAILY AS NEEDED FOR  MIGRAINE MAY REPEAT IN 2 HOURS IF NEEDED X1 9 tablet 2   rosuvastatin  (CRESTOR ) 20 MG tablet TAKE 1 TABLET BY MOUTH EVERY DAY 90 tablet 3   tirzepatide  (ZEPBOUND ) 2.5 MG/0.5ML Pen Inject 2.5 mg into the skin once a week. 2 mL  11   No current facility-administered medications for this visit.    REVIEW OF SYSTEMS:   All relevant systems were reviewed with the patient and are negative.  PHYSICAL EXAMINATION: ECOG PERFORMANCE STATUS: 0 - Asymptomatic VSS  GENERAL: alert, no distress and comfortable SKIN: skin color is normal, no jaundice LUNGS: Effort normal, no respiratory distress.    LABORATORY & PATHOLOGY DATA:  I have reviewed the results of labs, PSA and biopsy results related to his cancer.  RADIOGRAPHIC STUDIES: I have reviewed the radiological images related to his cancer during tumor board meeting.

## 2024-07-16 NOTE — Progress Notes (Unsigned)
° °                              Care Plan Summary  Name: Jorge Kelly DOB: Dec 26, 1955   Your Medical Team:   Urologist -  Dr. Gretel Ferrara, [facility]  Radiation Oncologist - Dr. Donnice Barge, [facility]   Medical Oncologist - Dr. Pauletta Chihuahua, [facility]  Recommendations: 1)  2)  3)  * These recommendations are based on information available as of todays consult.      Recommendations may change depending on the results of further tests or exams.    Next Steps: 1)  2) 3)  When appointments need to be scheduled, you will be contacted by [facility] and/or [facility].  Questions?  Please do not hesitate to call Jorge Kelly, BSN, RN at [phone] with any questions or concerns.  Jorge is your Oncology Nurse Navigator and is available to assist you while youre receiving your medical care at [facility].

## 2024-07-16 NOTE — Assessment & Plan Note (Addendum)
 SABRA

## 2024-07-17 ENCOUNTER — Encounter: Payer: Self-pay | Admitting: Radiation Oncology

## 2024-07-17 ENCOUNTER — Ambulatory Visit
Admission: RE | Admit: 2024-07-17 | Discharge: 2024-07-17 | Disposition: A | Source: Ambulatory Visit | Attending: Radiation Oncology | Admitting: Radiation Oncology

## 2024-07-17 ENCOUNTER — Inpatient Hospital Stay

## 2024-07-17 VITALS — BP 123/83 | HR 83 | Temp 97.3°F | Resp 18 | Ht 73.5 in | Wt 214.1 lb

## 2024-07-17 DIAGNOSIS — Z807 Family history of other malignant neoplasms of lymphoid, hematopoietic and related tissues: Secondary | ICD-10-CM | POA: Insufficient documentation

## 2024-07-17 DIAGNOSIS — C61 Malignant neoplasm of prostate: Secondary | ICD-10-CM | POA: Insufficient documentation

## 2024-07-17 DIAGNOSIS — Z809 Family history of malignant neoplasm, unspecified: Secondary | ICD-10-CM | POA: Insufficient documentation

## 2024-07-17 DIAGNOSIS — Z806 Family history of leukemia: Secondary | ICD-10-CM | POA: Diagnosis not present

## 2024-07-20 ENCOUNTER — Other Ambulatory Visit (HOSPITAL_COMMUNITY): Payer: Self-pay

## 2024-07-24 NOTE — Progress Notes (Signed)
 Patient was presented to the [facility] on 12/12 for stage T1c adenocarcinoma of the prostate with a Gleason's score of 3+3 and a PSA of 5.07 and decided to proceed with active surveillance.    Patient is now scheduled for urology follow up's with Dr. Watt on 3/9 for PSA check and 3/16 for MD follow up.   No needs at this time.

## 2024-08-05 ENCOUNTER — Telehealth: Payer: Self-pay

## 2024-08-05 ENCOUNTER — Other Ambulatory Visit (HOSPITAL_COMMUNITY): Payer: Self-pay

## 2024-08-05 NOTE — Telephone Encounter (Signed)
 Pharmacy Patient Advocate Encounter  Received notification from [facility] that Prior Authorization for Zepbound  2.5MG /0.5ML pen-injectors  has been APPROVED from 08/05/24 to 08/05/25. Unable to obtain price due to refill too soon rejection, last fill date 07/20/24 next available fill date01/05/26   PA #/Case ID/Reference #: [id number]

## 2024-08-05 NOTE — Telephone Encounter (Signed)
 Pharmacy Patient Advocate Encounter   Received notification from Onbase that prior authorization for Zepbound  2.5MG /0.5ML pen-injectors  is required/requested.   Insurance verification completed.   The patient is insured through [facility].   Per test claim: PA required; PA submitted to above mentioned insurance via Latent Key/confirmation #/EOC [id number] Status is pending

## 2024-08-20 ENCOUNTER — Other Ambulatory Visit (HOSPITAL_COMMUNITY): Payer: Self-pay

## 2024-12-01 ENCOUNTER — Ambulatory Visit: Admitting: Family Medicine
